# Patient Record
Sex: Female | Born: 1960 | Race: White | Hispanic: No | Marital: Married | Smoking: Former smoker
Health system: Southern US, Community
[De-identification: ages and names within clinical notes are randomized; demographics above are authoritative.]

## PROBLEM LIST (undated history)

## (undated) DIAGNOSIS — D649 Anemia, unspecified: Secondary | ICD-10-CM

## (undated) DIAGNOSIS — F419 Anxiety disorder, unspecified: Secondary | ICD-10-CM

## (undated) DIAGNOSIS — Z87442 Personal history of urinary calculi: Secondary | ICD-10-CM

## (undated) DIAGNOSIS — K219 Gastro-esophageal reflux disease without esophagitis: Secondary | ICD-10-CM

## (undated) DIAGNOSIS — I10 Essential (primary) hypertension: Secondary | ICD-10-CM

## (undated) DIAGNOSIS — D3A090 Benign carcinoid tumor of the bronchus and lung: Secondary | ICD-10-CM

## (undated) DIAGNOSIS — Z973 Presence of spectacles and contact lenses: Secondary | ICD-10-CM

## (undated) DIAGNOSIS — D490 Neoplasm of unspecified behavior of digestive system: Secondary | ICD-10-CM

## (undated) DIAGNOSIS — K2970 Gastritis, unspecified, without bleeding: Secondary | ICD-10-CM

## (undated) DIAGNOSIS — J189 Pneumonia, unspecified organism: Secondary | ICD-10-CM

## (undated) DIAGNOSIS — J45909 Unspecified asthma, uncomplicated: Secondary | ICD-10-CM

## (undated) DIAGNOSIS — R0600 Dyspnea, unspecified: Secondary | ICD-10-CM

## (undated) DIAGNOSIS — M1990 Unspecified osteoarthritis, unspecified site: Secondary | ICD-10-CM

## (undated) DIAGNOSIS — J439 Emphysema, unspecified: Secondary | ICD-10-CM

## (undated) DIAGNOSIS — C801 Malignant (primary) neoplasm, unspecified: Secondary | ICD-10-CM

## (undated) DIAGNOSIS — E785 Hyperlipidemia, unspecified: Secondary | ICD-10-CM

## (undated) DIAGNOSIS — J302 Other seasonal allergic rhinitis: Secondary | ICD-10-CM

## (undated) HISTORY — DX: Unspecified asthma, uncomplicated: J45.909

## (undated) HISTORY — PX: DOPPLER ECHOCARDIOGRAPHY: SHX263

## (undated) HISTORY — DX: Emphysema, unspecified: J43.9

## (undated) HISTORY — PX: COLONOSCOPY: SHX174

## (undated) HISTORY — PX: ABLATION: SHX5711

## (undated) HISTORY — DX: Pneumonia, unspecified organism: J18.9

## (undated) HISTORY — DX: Essential (primary) hypertension: I10

## (undated) HISTORY — PX: WISDOM TOOTH EXTRACTION: SHX21

## (undated) HISTORY — PX: TONSILLECTOMY: SUR1361

## (undated) HISTORY — DX: Anxiety disorder, unspecified: F41.9

## (undated) HISTORY — DX: Hyperlipidemia, unspecified: E78.5

---

## 1985-07-27 HISTORY — PX: TUBAL LIGATION: SHX77

## 2000-03-03 ENCOUNTER — Other Ambulatory Visit: Admission: RE | Admit: 2000-03-03 | Discharge: 2000-03-03 | Payer: Self-pay | Admitting: Obstetrics & Gynecology

## 2003-07-17 ENCOUNTER — Other Ambulatory Visit: Admission: RE | Admit: 2003-07-17 | Discharge: 2003-07-17 | Payer: Self-pay | Admitting: Internal Medicine

## 2003-10-23 ENCOUNTER — Ambulatory Visit (HOSPITAL_COMMUNITY): Admission: RE | Admit: 2003-10-23 | Discharge: 2003-10-23 | Payer: Self-pay | Admitting: Obstetrics and Gynecology

## 2004-07-24 ENCOUNTER — Other Ambulatory Visit: Admission: RE | Admit: 2004-07-24 | Discharge: 2004-07-24 | Payer: Self-pay | Admitting: Internal Medicine

## 2010-08-16 ENCOUNTER — Encounter: Payer: Self-pay | Admitting: Internal Medicine

## 2018-02-24 DIAGNOSIS — J189 Pneumonia, unspecified organism: Secondary | ICD-10-CM

## 2018-02-24 HISTORY — DX: Pneumonia, unspecified organism: J18.9

## 2018-02-24 HISTORY — PX: TEE WITHOUT CARDIOVERSION: SHX5443

## 2018-03-04 ENCOUNTER — Telehealth: Payer: Self-pay | Admitting: *Deleted

## 2018-03-04 NOTE — Telephone Encounter (Signed)
REFERRAL SENT TO [place], [place]

## 2018-03-12 NOTE — Progress Notes (Signed)
Cardiology Office Note   Date:  03/14/2018   ID:  Shannon [name], DOB September 04, 1960, MRN [id number]  PCP:  Rochel Brome, MD  Cardiologist:   Jenkins Rouge, MD   No chief complaint on file.     History of Present Illness: Shannon [name] is a 57 y.o. female who presents for consultation regarding chest pain. Referred by Adron Bene PA.  She is a former smoker with COPD/Emphysema. Had URI prolonged and CT chest ordered that showed coronary calcium. She has anxiety and gets tightness in chest when she is anxious Had been a good walker before lung issues now some persistent exertional dyspnea. Quit smoking 12 years ago No fever cough sputum. No PND/Orthopnea or edema.   Retired Systems analyst [employer] One sone in [place] and one daughter in [place] Enjoys going to [employer]     Past Medical History:  Diagnosis Date  . HLD (hyperlipidemia)     History reviewed. No pertinent surgical history.   Current Outpatient Medications  Medication Sig Dispense Refill  . atorvastatin (LIPITOR) 20 MG tablet Take 1 tablet by mouth daily.    . busPIRone (BUSPAR) 10 MG tablet Take 1 tablet by mouth 2 (two) times daily.    . Cyanocobalamin (VITAMIN B-12) 2500 MCG SUBL Place under the tongue as directed.    Marland Kitchen LORazepam (ATIVAN) 0.5 MG tablet Take 1 tablet by mouth daily.     No current facility-administered medications for this visit.     Allergies:   Levaquin [levofloxacin in d5w]    Social History:  The patient  reports that she has quit smoking. She quit after 33.00 years of use. She has never used smokeless tobacco. She reports that she has current or past drug history.   Family History:  The patient's family history includes COPD in her mother; Diabetes in her father; Heart disease in her mother.    ROS:  Please see the history of present illness.   Otherwise, review of systems are positive for none.   All other systems are reviewed and negative.    PHYSICAL EXAM: VS:   BP 132/82   Pulse 79   Ht 5' 5.5" (1.664 m)   Wt 191 lb (86.6 kg)   SpO2 98%   BMI 31.30 kg/m  , BMI Body mass index is 31.3 kg/m. Affect appropriate Healthy:  appears stated age 8: normal Neck supple with no adenopathy JVP normal no bruits no thyromegaly Lungs clear with no wheezing and good diaphragmatic motion Heart:  S1/S2 no murmur, no rub, gallop or click PMI normal Abdomen: benighn, BS positve, no tenderness, no AAA no bruit.  No HSM or HJR Distal pulses intact with no bruits No edema Neuro non-focal Skin warm and dry No muscular weakness    EKG:  03/14/18 SR rate 71 normal    Recent Labs: No results found for requested labs within last 8760 hours.    Lipid Panel No results found for: CHOL, TRIG, HDL, CHOLHDL, VLDL, LDLCALC, LDLDIRECT    Wt Readings from Last 3 Encounters:  03/14/18 191 lb (86.6 kg)      Other studies Reviewed: Additional studies/ records that were reviewed today include: Notes from primary .    ASSESSMENT AND PLAN:  1.  Chest Pain:  With coronary calcium on CT normal ECG f/u ETT 2. Dyspnea: related to lung disease check TTE for RV/LV function estimate PA pressure 3. Anxiety:  Continue Buspar improved  4. HLD:  Continue  statin labs with primary    Current medicines are reviewed at length with the patient today.  The patient does not have concerns regarding medicines.  The following changes have been made:  no change  Labs/ tests ordered today include: ETT and TTE  No orders of the defined types were placed in this encounter.    Disposition:   FU with cardiology PRN      Signed, Jenkins Rouge, MD  03/14/2018 8:50 AM    [facility] [address], [place], [place]  [address] Phone: [phone]; Fax: [phone]

## 2018-03-14 ENCOUNTER — Encounter: Payer: Self-pay | Admitting: Cardiovascular Disease

## 2018-03-14 ENCOUNTER — Ambulatory Visit: Payer: BC Managed Care – PPO | Admitting: Cardiovascular Disease

## 2018-03-14 VITALS — BP 132/82 | HR 79 | Ht 65.5 in | Wt 191.0 lb

## 2018-03-14 DIAGNOSIS — R079 Chest pain, unspecified: Secondary | ICD-10-CM | POA: Diagnosis not present

## 2018-03-14 NOTE — Patient Instructions (Addendum)
Medication Instructions:  Your physician recommends that you continue on your current medications as directed. Please refer to the Current Medication list given to you today.  Labwork: NONE  Testing/Procedures: Your physician has requested that you have an echocardiogram. Echocardiography is a painless test that uses sound waves to create images of your heart. It provides your doctor with information about the size and shape of your heart and how well your heart's chambers and valves are working. This procedure takes approximately one hour. There are no restrictions for this procedure.  Your physician has requested that you have an exercise tolerance test. For further information please visit [email or web address]. Please also follow instruction sheet, as given.  Follow-Up: Your physician wants you to follow-up as needed with Dr. Johnsie Cancel.    If you need a refill on your cardiac medications before your next appointment, please call your pharmacy.

## 2018-03-23 ENCOUNTER — Ambulatory Visit (HOSPITAL_COMMUNITY): Payer: BC Managed Care – PPO | Attending: Cardiology

## 2018-03-23 ENCOUNTER — Other Ambulatory Visit: Payer: Self-pay

## 2018-03-23 ENCOUNTER — Ambulatory Visit (INDEPENDENT_AMBULATORY_CARE_PROVIDER_SITE_OTHER): Payer: BC Managed Care – PPO

## 2018-03-23 DIAGNOSIS — R079 Chest pain, unspecified: Secondary | ICD-10-CM | POA: Diagnosis present

## 2018-03-23 DIAGNOSIS — Z87891 Personal history of nicotine dependence: Secondary | ICD-10-CM | POA: Diagnosis not present

## 2018-03-23 DIAGNOSIS — R0600 Dyspnea, unspecified: Secondary | ICD-10-CM | POA: Diagnosis not present

## 2018-03-23 DIAGNOSIS — E785 Hyperlipidemia, unspecified: Secondary | ICD-10-CM | POA: Diagnosis not present

## 2018-03-23 LAB — EXERCISE TOLERANCE TEST
CHL CUP MPHR: 163 {beats}/min
CHL CUP RESTING HR STRESS: 78 {beats}/min
CHL RATE OF PERCEIVED EXERTION: 17
CSEPEDS: 0 s
Estimated workload: 10.1 METS
Exercise duration (min): 9 min
Peak HR: 153 {beats}/min
Percent HR: 93 %

## 2018-03-29 LAB — PULMONARY FUNCTION TEST

## 2018-04-07 ENCOUNTER — Encounter: Payer: Self-pay | Admitting: Pulmonary Disease

## 2018-04-08 ENCOUNTER — Encounter: Payer: Self-pay | Admitting: Pulmonary Disease

## 2018-04-08 ENCOUNTER — Ambulatory Visit (INDEPENDENT_AMBULATORY_CARE_PROVIDER_SITE_OTHER): Payer: BC Managed Care – PPO | Admitting: Pulmonary Disease

## 2018-04-08 ENCOUNTER — Other Ambulatory Visit: Payer: BC Managed Care – PPO

## 2018-04-08 VITALS — BP 126/78 | HR 78 | Ht 64.0 in | Wt 192.2 lb

## 2018-04-08 DIAGNOSIS — J309 Allergic rhinitis, unspecified: Secondary | ICD-10-CM

## 2018-04-08 DIAGNOSIS — R0602 Shortness of breath: Secondary | ICD-10-CM | POA: Diagnosis not present

## 2018-04-08 DIAGNOSIS — J302 Other seasonal allergic rhinitis: Secondary | ICD-10-CM

## 2018-04-08 DIAGNOSIS — Z8709 Personal history of other diseases of the respiratory system: Secondary | ICD-10-CM | POA: Diagnosis not present

## 2018-04-08 DIAGNOSIS — R0609 Other forms of dyspnea: Secondary | ICD-10-CM

## 2018-04-08 NOTE — Progress Notes (Signed)
Synopsis: Referred in September 2019 for shortness of breath by Rochel Brome, MD  Subjective:   PATIENT ID: Shannon [name] GENDER: female DOB: [birth year], MRN: [id number]  Chief Complaint  Patient presents with  . Consult    Diagnised iwtih Bronchitis in July, developed into PNE, SOB with exertion, stopped symbicort and breathing improved. Denies cough and chest discomfort.     She was diagnosed with bronchitis in July followed by pneumonia. She was treated with antibiotics and prednisone. Unsure how long but she felt better after. She was started on symbicort as well. Once she felt better she stopped taking most of her medications. She started taking her symbicort again she felt worse for a little bit. After stopping the inhaler she felt better. Currently she is exercising and walking more. Not using an inhaler.   Former smoker, quit 12 years ago, history of asthma, lots of second hand smoker, mother was a smoker. She was in the ED as peds for asthma treatments. She did have allergy testing as a child but does not remember any details. Normally had 1-2 episodes of bronchitis with when she was smoking. This has stopped since she stopped smoking.     Past Medical History:  Diagnosis Date  . HLD (hyperlipidemia)      Family History  Problem Relation Age of Onset  . Heart disease Mother   . COPD Mother   . Emphysema Mother   . Diabetes Father      Past Surgical History:  Procedure Laterality Date  . ABLATION     endometrial  . TUBAL LIGATION  1987    Social History   Socioeconomic History  . Marital status: Single    Spouse name: Not on file  . Number of children: Not on file  . Years of education: Not on file  . Highest education level: Not on file  Occupational History  . Not on file  Social Needs  . Financial resource strain: Not on file  . Food insecurity:    Worry: Not on file    Inability: Not on file  . Transportation needs:    Medical: Not on file   Non-medical: Not on file  Tobacco Use  . Smoking status: Former Smoker    Years: 33.00  . Smokeless tobacco: Never Used  . Tobacco comment: quit 12 years ago  Substance and Sexual Activity  . Alcohol use: Not on file  . Drug use: Not Currently  . Sexual activity: Not on file  Lifestyle  . Physical activity:    Days per week: Not on file    Minutes per session: Not on file  . Stress: Not on file  Relationships  . Social connections:    Talks on phone: Not on file    Gets together: Not on file    Attends religious service: Not on file    Active member of club or organization: Not on file    Attends meetings of clubs or organizations: Not on file    Relationship status: Not on file  . Intimate partner violence:    Fear of current or ex partner: Not on file    Emotionally abused: Not on file    Physically abused: Not on file    Forced sexual activity: Not on file  Other Topics Concern  . Not on file  Social History Narrative  . Not on file     Allergies  Allergen Reactions  . Levaquin [Levofloxacin In D5w]  Nausea vomiting      Outpatient Medications Prior to Visit  Medication Sig Dispense Refill  . atorvastatin (LIPITOR) 20 MG tablet Take 1 tablet by mouth daily.    . busPIRone (BUSPAR) 10 MG tablet Take 1 tablet by mouth 2 (two) times daily.    . Cyanocobalamin (VITAMIN B-12) 2500 MCG SUBL Place under the tongue as directed.    Marland Kitchen LORazepam (ATIVAN) 0.5 MG tablet Take 1 tablet by mouth daily.     No facility-administered medications prior to visit.     Review of Systems  Constitutional: Negative for chills, fever, malaise/fatigue and weight loss.  HENT: Negative for hearing loss, sore throat and tinnitus.   Eyes: Negative for blurred vision and double vision.  Respiratory: Positive for shortness of breath. Negative for cough, hemoptysis, sputum production, wheezing and stridor.   Cardiovascular: Negative for chest pain, palpitations, orthopnea, leg swelling and  PND.  Gastrointestinal: Negative for abdominal pain, constipation, diarrhea, heartburn, nausea and vomiting.  Genitourinary: Negative for dysuria, hematuria and urgency.  Musculoskeletal: Negative for joint pain and myalgias.  Skin: Negative for itching and rash.  Neurological: Negative for dizziness, tingling, weakness and headaches.  Endo/Heme/Allergies: Negative for environmental allergies. Does not bruise/bleed easily.  Psychiatric/Behavioral: Negative for depression. The patient is not nervous/anxious and does not have insomnia.   All other systems reviewed and are negative.    Objective:  Physical Exam  Constitutional: She is oriented to person, place, and time. She appears well-developed and well-nourished. No distress.  HENT:  Head: Normocephalic and atraumatic.  Mouth/Throat: Oropharynx is clear and moist.  Eyes: Pupils are equal, round, and reactive to light. Conjunctivae are normal. No scleral icterus.  Neck: Neck supple. No JVD present. No tracheal deviation present.  Cardiovascular: Normal rate, regular rhythm, normal heart sounds and intact distal pulses.  No murmur heard. Pulmonary/Chest: Effort normal and breath sounds normal. No accessory muscle usage or stridor. No tachypnea. No respiratory distress. She has no wheezes. She has no rhonchi. She has no rales.  Abdominal: Soft. Bowel sounds are normal. She exhibits no distension. There is no tenderness.  Musculoskeletal: She exhibits no edema or tenderness.  Bilateral cool lower extremities, slightly purplish in color.  Lymphadenopathy:    She has no cervical adenopathy.  Neurological: She is alert and oriented to person, place, and time.  Skin: Skin is warm and dry. Capillary refill takes less than 2 seconds. No rash noted.  Psychiatric: She has a normal mood and affect. Her behavior is normal.  Vitals reviewed.    Vitals:   04/08/18 1417  BP: 126/78  Pulse: 78  SpO2: 94%  Weight: 192 lb 3.2 oz (87.2 kg)    Height: 5\' 4"  (1.626 m)   94% on RA BMI Readings from Last 3 Encounters:  04/08/18 32.99 kg/m  03/14/18 31.30 kg/m   Wt Readings from Last 3 Encounters:  04/08/18 192 lb 3.2 oz (87.2 kg)  03/14/18 191 lb (86.6 kg)     CBC No results found for: WBC, RBC, HGB, HCT, PLT, MCV, MCH, MCHC, RDW, LYMPHSABS, MONOABS, EOSABS, BASOSABS  Chest Imaging: CT chest 03/02/2018: No evidence of PE, mild centrilobular emphysema, small hiatal hernia.  Pulmonary Functions Testing Results: Office spirometry 03/29/2018: FVC 3.03 L, 89% predicted FEV1 2.18 L, 82% predicted Ratio 71.9  FeNO: None    Pathology: None   Echocardiogram:  03/23/2018: Left ventricle: The cavity size was normal. Wall thickness was   normal. Systolic function was normal. The estimated ejection  fraction was in the range of 55% to 60%. Wall motion was normal;   there were no regional wall motion abnormalities. Doppler   parameters are consistent with abnormal left ventricular   relaxation (grade 1 diastolic dysfunction).  Treadmill exercise stress test: 03/23/2018: Normal heart rate response with Bruce protocol.  Heart Catheterization: None    Assessment & Plan:   Seasonal allergies - Plan: Pulmonary function test, CBC w/Diff, Resp Allergy Profile Regn2DC [facility]  Allergic rhinitis, unspecified seasonality, unspecified trigger - Plan: Pulmonary function test  History of asthma  SOB (shortness of breath)  DOE (dyspnea on exertion)  Discussion:  This is a pleasant 57 year old female with past medical history of asthma as a child.  She had a recent bout of acute bronchitis and pneumonia treated with antibiotics and steroids.  It has taken approximately 6 to 8 weeks but she has finally started to feel better.  She has been walking and exercise more and would recommend her to continue to do this.  FULL PFTS to be completed to look for any underlying obstruction occluding evaluation of her lung volumes  DLCO. Blood work today: CBC w/ diff, Regional allergy panel  Flonase OTC 1 spray each nostril twice daily  As needed albuterol inhaler If needed will make recommendations regarding any change in medication after PFTs or lab work has been completed and resulted. RTC in 6 months    Current Outpatient Medications:  .  atorvastatin (LIPITOR) 20 MG tablet, Take 1 tablet by mouth daily., Disp: , Rfl:  .  busPIRone (BUSPAR) 10 MG tablet, Take 1 tablet by mouth 2 (two) times daily., Disp: , Rfl:  .  Cyanocobalamin (VITAMIN B-12) 2500 MCG SUBL, Place under the tongue as directed., Disp: , Rfl:  .  LORazepam (ATIVAN) 0.5 MG tablet, Take 1 tablet by mouth daily., Disp: , Rfl:    Garner Nash, DO [facility] 04/08/2018 3:15 PM

## 2018-04-08 NOTE — Patient Instructions (Signed)
FULL PFTS to be completed Blood work today: CBC w/ diff, Regional allergy panel  Flonase OTC 1 spray each nostril twice daily  As needed albuterol inhaler RTC in 6 months

## 2018-04-11 ENCOUNTER — Other Ambulatory Visit (INDEPENDENT_AMBULATORY_CARE_PROVIDER_SITE_OTHER): Payer: BC Managed Care – PPO

## 2018-04-11 DIAGNOSIS — J302 Other seasonal allergic rhinitis: Secondary | ICD-10-CM

## 2018-04-11 LAB — RESPIRATORY ALLERGY PROFILE REGION II ~~LOC~~
Allergen, A. alternata, m6: <0.1 kU/L
Allergen, Comm Silver Birch, t9: <0.1 kU/L
Allergen, Cottonwood, t14: <0.1 kU/L
Allergen, D pternoyssinus,d7: <0.1 kU/L
Allergen, Mouse Urine Protein, e78: <0.1 kU/L
Allergen, Oak,t7: <0.1 kU/L
Allergen, P. notatum, m1: <0.1 kU/L
Aspergillus fumigatus, m3: <0.1 kU/L
Bermuda Grass: <0.1 kU/L
Box Elder IgE: <0.1 kU/L
CLASS: 0
CLASS: 0
CLASS: 0
CLASS: 0
CLASS: 0
CLASS: 0
CLASS: 0
CLASS: 0
CLASS: 0
CLASS: 0
CLASS: 0
CLASS: 0
Class: 0
Class: 0
Class: 0
Class: 0
Class: 0
Class: 0
Class: 0
Class: 0
Class: 0
Class: 0
Class: 0
Class: 0
Cockroach: <0.1 kU/L
D. farinae: <0.1 kU/L
Dog Dander: 0.12 kU/L — ABNORMAL HIGH
Elm IgE: <0.1 kU/L
IgE (Immunoglobulin E), Serum: 27 kU/L (ref <=114)
Johnson Grass: <0.1 kU/L
Sheep Sorrel IgE: <0.1 kU/L
Timothy Grass: <0.1 kU/L

## 2018-04-11 LAB — CBC WITH DIFFERENTIAL/PLATELET
BASOS ABS: 0 10*3/uL (ref 0.0–0.1)
Basophils Relative: 0.5 % (ref 0.0–3.0)
EOS ABS: 0.2 10*3/uL (ref 0.0–0.7)
Eosinophils Relative: 3 % (ref 0.0–5.0)
HCT: 42.3 % (ref 36.0–46.0)
Hemoglobin: 14.3 g/dL (ref 12.0–15.0)
LYMPHS ABS: 1.9 10*3/uL (ref 0.7–4.0)
Lymphocytes Relative: 25.1 % (ref 12.0–46.0)
MCHC: 33.8 g/dL (ref 30.0–36.0)
MCV: 85.6 fl (ref 78.0–100.0)
MONO ABS: 0.6 10*3/uL (ref 0.1–1.0)
MONOS PCT: 7.4 % (ref 3.0–12.0)
NEUTROS ABS: 4.9 10*3/uL (ref 1.4–7.7)
NEUTROS PCT: 64 % (ref 43.0–77.0)
PLATELETS: 205 10*3/uL (ref 150.0–400.0)
RBC: 4.94 Mil/uL (ref 3.87–5.11)
RDW: 14.2 % (ref 11.5–15.5)
WBC: 7.6 10*3/uL (ref 4.0–10.5)

## 2018-04-11 LAB — INTERPRETATION:

## 2018-04-13 ENCOUNTER — Telehealth: Payer: Self-pay | Admitting: Pulmonary Disease

## 2018-04-13 NOTE — Telephone Encounter (Signed)
Spoke with patient, informed of results per BI. Voiced understanding. Nothing further needed at this time.

## 2018-04-25 ENCOUNTER — Encounter: Payer: Self-pay | Admitting: Internal Medicine

## 2018-05-31 ENCOUNTER — Ambulatory Visit: Payer: BC Managed Care – PPO | Admitting: Pulmonary Disease

## 2018-05-31 ENCOUNTER — Encounter: Payer: Self-pay | Admitting: Internal Medicine

## 2018-05-31 ENCOUNTER — Ambulatory Visit (AMBULATORY_SURGERY_CENTER): Payer: Self-pay | Admitting: *Deleted

## 2018-05-31 VITALS — Ht 65.0 in | Wt 194.8 lb

## 2018-05-31 DIAGNOSIS — Z1211 Encounter for screening for malignant neoplasm of colon: Secondary | ICD-10-CM

## 2018-05-31 MED ORDER — NA SULFATE-K SULFATE-MG SULF 17.5-3.13-1.6 GM/177ML PO SOLN: 1.0000 | Freq: Once | ORAL | 0 refills | Status: AC

## 2018-05-31 NOTE — Progress Notes (Signed)
No egg or soy allergy known to patient  No issues with past sedation with any surgeries  or procedures, no intubation problems  No diet pills per patient No home 02 use per patient  No blood thinners per patient  Pt denies issues with constipation  No A fib or A flutter  EMMI video sent to pt's e mail  Suprep $15 coupon to pt  Pt has a breathing test scheduled for 11-6- will call us if they start 02 or any significant changes

## 2018-06-01 ENCOUNTER — Ambulatory Visit (INDEPENDENT_AMBULATORY_CARE_PROVIDER_SITE_OTHER): Payer: BC Managed Care – PPO | Admitting: Pulmonary Disease

## 2018-06-01 ENCOUNTER — Encounter: Payer: Self-pay | Admitting: Pulmonary Disease

## 2018-06-01 VITALS — BP 124/78 | HR 87 | Ht 64.5 in | Wt 193.0 lb

## 2018-06-01 DIAGNOSIS — R0602 Shortness of breath: Secondary | ICD-10-CM

## 2018-06-01 DIAGNOSIS — J302 Other seasonal allergic rhinitis: Secondary | ICD-10-CM

## 2018-06-01 DIAGNOSIS — Z8709 Personal history of other diseases of the respiratory system: Secondary | ICD-10-CM | POA: Diagnosis not present

## 2018-06-01 DIAGNOSIS — J309 Allergic rhinitis, unspecified: Secondary | ICD-10-CM | POA: Diagnosis not present

## 2018-06-01 LAB — PULMONARY FUNCTION TEST
DL/VA % pred: 91 %
DL/VA: 4.45 ml/min/mmHg/L
DLCO COR % PRED: 87 %
DLCO COR: 21.79 ml/min/mmHg
DLCO UNC % PRED: 89 %
DLCO UNC: 22.37 ml/min/mmHg
FEF 25-75 PRE: 1.53 L/s
FEF 25-75 Post: 1.75 L/sec
FEF2575-%Change-Post: 14 %
FEF2575-%PRED-PRE: 60 %
FEF2575-%Pred-Post: 69 %
FEV1-%Change-Post: 5 %
FEV1-%PRED-PRE: 77 %
FEV1-%Pred-Post: 80 %
FEV1-Post: 2.17 L
FEV1-Pre: 2.07 L
FEV1FVC-%Change-Post: -2 %
FEV1FVC-%Pred-Pre: 92 %
FEV6-%Change-Post: 8 %
FEV6-%PRED-POST: 91 %
FEV6-%PRED-PRE: 84 %
FEV6-POST: 3.05 L
FEV6-Pre: 2.81 L
FEV6FVC-%CHANGE-POST: 0 %
FEV6FVC-%PRED-POST: 103 %
FEV6FVC-%Pred-Pre: 103 %
FVC-%Change-Post: 8 %
FVC-%PRED-POST: 88 %
FVC-%Pred-Pre: 82 %
FVC-POST: 3.05 L
FVC-Pre: 2.83 L
POST FEV6/FVC RATIO: 100 %
PRE FEV1/FVC RATIO: 73 %
Post FEV1/FVC ratio: 71 %
Pre FEV6/FVC Ratio: 100 %
RV % pred: 150 %
RV: 2.95 L
TLC % PRED: 114 %
TLC: 5.87 L

## 2018-06-01 MED ORDER — FLUTICASONE FUROATE-VILANTEROL 200-25 MCG/INH IN AEPB: 1.0000 | INHALATION_SPRAY | Freq: Every day | RESPIRATORY_TRACT | 5 refills | Status: DC

## 2018-06-01 NOTE — Progress Notes (Signed)
Synopsis: Referred in September 2019 for shortness of breath by Rochel Brome, MD  Subjective:   PATIENT ID: Shannon [name] GENDER: female DOB: 1960-10-31, MRN: [id number]  Chief Complaint  Patient presents with  . Follow-up    PFT today     She was diagnosed with bronchitis in July followed by pneumonia. She was treated with antibiotics and prednisone. Unsure how long but she felt better after. She was started on symbicort as well. Once she felt better she stopped taking most of her medications. She started taking her symbicort again she felt worse for a little bit. After stopping the inhaler she felt better. Currently she is exercising and walking more. Not using an inhaler.   Former smoker, quit 12 years ago, history of asthma, lots of second hand smoker, mother was a smoker. She was in the ED as peds for asthma treatments. She did have allergy testing as a child but does not remember any details. Normally had 1-2 episodes of bronchitis with when she was smoking. This has stopped since she stopped smoking.     Past Medical History:  Diagnosis Date  . Anxiety   . Asthma   . HLD (hyperlipidemia)   . Pneumonia      Family History  Problem Relation Age of Onset  . Heart disease Mother   . COPD Mother   . Emphysema Mother   . Diabetes Father   . Colon cancer Neg Hx   . Colon polyps Neg Hx   . Esophageal cancer Neg Hx   . Rectal cancer Neg Hx   . Stomach cancer Neg Hx      Past Surgical History:  Procedure Laterality Date  . ABLATION     endometrial  . DOPPLER ECHOCARDIOGRAPHY    . TEE WITHOUT CARDIOVERSION  02/2018  . TUBAL LIGATION  1987    Social History   Socioeconomic History  . Marital status: Married    Spouse name: Not on file  . Number of children: Not on file  . Years of education: Not on file  . Highest education level: Not on file  Occupational History  . Not on file  Social Needs  . Financial resource strain: Not on file  . Food insecurity:   Worry: Not on file    Inability: Not on file  . Transportation needs:    Medical: Not on file    Non-medical: Not on file  Tobacco Use  . Smoking status: Former Smoker    Years: 33.00  . Smokeless tobacco: Never Used  . Tobacco comment: quit 12 years ago  Substance and Sexual Activity  . Alcohol use: Yes    Comment: occ- rare   . Drug use: Not Currently  . Sexual activity: Not on file  Lifestyle  . Physical activity:    Days per week: Not on file    Minutes per session: Not on file  . Stress: Not on file  Relationships  . Social connections:    Talks on phone: Not on file    Gets together: Not on file    Attends religious service: Not on file    Active member of club or organization: Not on file    Attends meetings of clubs or organizations: Not on file    Relationship status: Not on file  . Intimate partner violence:    Fear of current or ex partner: Not on file    Emotionally abused: Not on file    Physically abused: Not  on file    Forced sexual activity: Not on file  Other Topics Concern  . Not on file  Social History Narrative  . Not on file     Allergies  Allergen Reactions  . Levaquin [Levofloxacin In D5w]     Nausea vomiting   . Other     Dog Dander      Outpatient Medications Prior to Visit  Medication Sig Dispense Refill  . atorvastatin (LIPITOR) 20 MG tablet Take 1 tablet by mouth daily.    . busPIRone (BUSPAR) 10 MG tablet Take 1 tablet by mouth 2 (two) times daily.    . Cyanocobalamin (VITAMIN B-12) 2500 MCG SUBL Place under the tongue as directed.    Marland Kitchen LORazepam (ATIVAN) 0.5 MG tablet Take 1 tablet by mouth daily.     No facility-administered medications prior to visit.     Review of Systems  Constitutional: Negative for chills, fever, malaise/fatigue and weight loss.  HENT: Negative for hearing loss, sore throat and tinnitus.   Eyes: Negative for blurred vision and double vision.  Respiratory: Positive for cough, sputum production, shortness  of breath and wheezing. Negative for hemoptysis and stridor.   Cardiovascular: Negative for chest pain, palpitations, orthopnea, leg swelling and PND.  Gastrointestinal: Negative for abdominal pain, constipation, diarrhea, heartburn, nausea and vomiting.  Genitourinary: Negative for dysuria, hematuria and urgency.  Musculoskeletal: Negative for joint pain and myalgias.  Skin: Negative for itching and rash.  Neurological: Negative for dizziness, tingling, weakness and headaches.  Endo/Heme/Allergies: Negative for environmental allergies. Does not bruise/bleed easily.  Psychiatric/Behavioral: Negative for depression. The patient is not nervous/anxious and does not have insomnia.   All other systems reviewed and are negative.    Objective:  Physical Exam  Constitutional: She is oriented to person, place, and time. She appears well-developed and well-nourished. No distress.  HENT:  Head: Normocephalic and atraumatic.  Mouth/Throat: Oropharynx is clear and moist.  Eyes: Pupils are equal, round, and reactive to light. Conjunctivae are normal. No scleral icterus.  Neck: Neck supple. No JVD present. No tracheal deviation present.  Cardiovascular: Normal rate, regular rhythm, normal heart sounds and intact distal pulses.  No murmur heard. Pulmonary/Chest: Effort normal and breath sounds normal. No accessory muscle usage or stridor. No tachypnea. No respiratory distress. She has no wheezes. She has no rhonchi. She has no rales.  Abdominal: Soft. Bowel sounds are normal. She exhibits no distension. There is no tenderness.  Musculoskeletal: She exhibits no edema or tenderness.  Lymphadenopathy:    She has no cervical adenopathy.  Neurological: She is alert and oriented to person, place, and time.  Skin: Skin is warm and dry. Capillary refill takes less than 2 seconds. No rash noted.  Psychiatric: She has a normal mood and affect. Her behavior is normal.  Vitals reviewed.    Vitals:   06/01/18  1556  BP: 124/78  Pulse: 87  SpO2: 97%  Weight: 193 lb (87.5 kg)  Height: 5' 4.5" (1.638 m)   97% on RA BMI Readings from Last 3 Encounters:  06/01/18 32.62 kg/m  05/31/18 32.42 kg/m  04/08/18 32.99 kg/m   Wt Readings from Last 3 Encounters:  06/01/18 193 lb (87.5 kg)  05/31/18 194 lb 12.8 oz (88.4 kg)  04/08/18 192 lb 3.2 oz (87.2 kg)     CBC    Component Value Date/Time   WBC 7.6 04/11/2018 1417   RBC 4.94 04/11/2018 1417   HGB 14.3 04/11/2018 1417   HCT 42.3 04/11/2018 1417  PLT 205.0 04/11/2018 1417   MCV 85.6 04/11/2018 1417   MCHC 33.8 04/11/2018 1417   RDW 14.2 04/11/2018 1417   LYMPHSABS 1.9 04/11/2018 1417   MONOABS 0.6 04/11/2018 1417   EOSABS 0.2 04/11/2018 1417   BASOSABS 0.0 04/11/2018 1417    Chest Imaging: CT chest 03/02/2018: No evidence of PE, mild centrilobular emphysema, small hiatal hernia.  Pulmonary Functions Testing Results: Office spirometry 03/29/2018: FVC 3.03 L, 89% predicted FEV1 2.18 L, 82% predicted Ratio 71.9  PFT Results Latest Ref Rng & Units 06/01/2018  FVC-Pre L 2.83  FVC-Predicted Pre % 82  FVC-Post L 3.05  FVC-Predicted Post % 88  Pre FEV1/FVC % % 73  Post FEV1/FCV % % 71  FEV1-Pre L 2.07  FEV1-Predicted Pre % 77  DLCO UNC% % 89  DLCO COR %Predicted % 91  TLC L 5.87  TLC % Predicted % 114  RV % Predicted % 150    FeNO: None    Pathology: None   Echocardiogram:  03/23/2018: Left ventricle: The cavity size was normal. Wall thickness was   normal. Systolic function was normal. The estimated ejection   fraction was in the range of 55% to 60%. Wall motion was normal;   there were no regional wall motion abnormalities. Doppler   parameters are consistent with abnormal left ventricular   relaxation (grade 1 diastolic dysfunction).  Treadmill exercise stress test: 03/23/2018: Normal heart rate response with Bruce protocol.  Heart Catheterization: None    Assessment & Plan:   Seasonal allergies  Allergic  rhinitis, unspecified seasonality, unspecified trigger  History of asthma  SOB (shortness of breath)  Discussion:  This is a 57 year old female with past medical history of asthma as a child.  She has had recurrent URI symptoms throughout the year.  Since we last saw each other she has improved from her prolonged baseline.  He had pulmonary function test completed today.  No evidence of obstruction.  Lung volumes suggest mild air trapping.  Prior IgE of 27, RAST positive for dog dander  Plan: Continue Flonase daily Continue albuterol as needed for shortness of breath and wheezing Sample of Breo 200 she will use this for the next few weeks and see if she can tell a difference in her dyspnea symptoms. Return to clinic in 6 months or as needed.  Greater than 50% of this patient's 25-minute office visit was spent face-to-face discussing the above recommendations and treatment plan.   Current Outpatient Medications:  .  atorvastatin (LIPITOR) 20 MG tablet, Take 1 tablet by mouth daily., Disp: , Rfl:  .  busPIRone (BUSPAR) 10 MG tablet, Take 1 tablet by mouth 2 (two) times daily., Disp: , Rfl:  .  Cyanocobalamin (VITAMIN B-12) 2500 MCG SUBL, Place under the tongue as directed., Disp: , Rfl:  .  LORazepam (ATIVAN) 0.5 MG tablet, Take 1 tablet by mouth daily., Disp: , Rfl:    Garner Nash, DO [facility] 06/01/2018 4:23 PM

## 2018-06-01 NOTE — Patient Instructions (Addendum)
Thank you for visiting Dr. Valeta Harms at [facility]. Today we recommend the following: No orders of the defined types were placed in this encounter.  Meds ordered this encounter  Medications  . fluticasone furoate-vilanterol (BREO ELLIPTA) 200-25 MCG/INH AEPB    Sig: Inhale 1 puff into the lungs daily.    Dispense:  28 each    Refill:  5    Order Specific Question:   Lot Number?    Answer:   [id number]    Order Specific Question:   Expiration Date?    Answer:   08/02/2019    Order Specific Question:   Manufacturer?    Answer:   [employer] [[id number]]    Order Specific Question:   Quantity    Answer:   2   Return in about 6 months (around 11/30/2018), or if symptoms worsen or fail to improve.  We are moving our office in November. The new address will be: [address] [employer] [facility] Phone: [phone]

## 2018-06-01 NOTE — Progress Notes (Signed)
PFT done today. 

## 2018-06-10 ENCOUNTER — Encounter: Payer: Self-pay | Admitting: Internal Medicine

## 2018-06-10 ENCOUNTER — Ambulatory Visit (AMBULATORY_SURGERY_CENTER): Payer: BC Managed Care – PPO | Admitting: Internal Medicine

## 2018-06-10 VITALS — BP 146/76 | HR 66 | Temp 97.8°F | Resp 13 | Ht 65.0 in | Wt 194.0 lb

## 2018-06-10 DIAGNOSIS — D122 Benign neoplasm of ascending colon: Secondary | ICD-10-CM

## 2018-06-10 DIAGNOSIS — K6289 Other specified diseases of anus and rectum: Secondary | ICD-10-CM | POA: Diagnosis not present

## 2018-06-10 DIAGNOSIS — Z1211 Encounter for screening for malignant neoplasm of colon: Secondary | ICD-10-CM | POA: Diagnosis not present

## 2018-06-10 DIAGNOSIS — D128 Benign neoplasm of rectum: Secondary | ICD-10-CM

## 2018-06-10 DIAGNOSIS — D3A026 Benign carcinoid tumor of the rectum: Secondary | ICD-10-CM | POA: Diagnosis not present

## 2018-06-10 MED ORDER — SODIUM CHLORIDE 0.9 % IV SOLN: 500.0000 mL | Freq: Once | INTRAVENOUS | Status: DC

## 2018-06-10 NOTE — Progress Notes (Signed)
Pt's states no medical or surgical changes since previsit or office visit. 

## 2018-06-10 NOTE — Progress Notes (Signed)
To PACU, VSS. Report to Rn.tb 

## 2018-06-10 NOTE — Op Note (Signed)
[facility] Patient Name: Shannon [name] Procedure Date: 06/10/2018 9:08 AM MRN: [id number] Endoscopist: Docia Chuck. [name] , MD Age: 57 Referring MD:  Date of Birth: 23-Jul-1961 Gender: Female Account #: [id number] Procedure:                Colonoscopy with cold snare polypectomy x 2; with                            biopsies Indications:              Screening for colorectal malignant neoplasm Medicines:                Monitored Anesthesia Care Procedure:                Pre-Anesthesia Assessment:                           - Prior to the procedure, a History and Physical                            was performed, and patient medications and                            allergies were reviewed. The patient's tolerance of                            previous anesthesia was also reviewed. The risks                            and benefits of the procedure and the sedation                            options and risks were discussed with the patient.                            All questions were answered, and informed consent                            was obtained. Prior Anticoagulants: The patient has                            taken no previous anticoagulant or antiplatelet                            agents. ASA Grade Assessment: II - A patient with                            mild systemic disease. After reviewing the risks                            and benefits, the patient was deemed in                            satisfactory condition to undergo the procedure.  After obtaining informed consent, the colonoscope                            was passed under direct vision. Throughout the                            procedure, the patient's blood pressure, pulse, and                            oxygen saturations were monitored continuously. The                            Colonoscope was introduced through the anus and                            advanced to the the  cecum, identified by                            appendiceal orifice and ileocecal valve. The                            ileocecal valve, appendiceal orifice, and rectum                            were photographed. The quality of the bowel                            preparation was good. The colonoscopy was performed                            without difficulty. The patient tolerated the                            procedure well. The bowel preparation used was                            SUPREP. Scope In: 9:18:49 AM Scope Out: 9:39:35 AM Scope Withdrawal Time: 0 hours 17 minutes 16 seconds  Total Procedure Duration: 0 hours 20 minutes 46 seconds  Findings:                 Two polyps were found in the ascending colon. The                            polyps were 2 to 5 mm in size. These polyps were                            removed with a cold snare. Resection and retrieval                            were complete.                           Multiple small and large-mouthed diverticula were  found in the entire colon.                           Bleeding external and internal hemorrhoids were                            found during retroflexion. The hemorrhoids were                            large.                           A submucosal mass was found in the posterior rectum                            approximately 3 to 4 cm from the anal verge. The                            mass was circular and well-circumscribed with a                            small central umbilication. In addition, its                            diameter measured 12-15 mm. This was tunnel                            biopsied with a cold forceps for histology. Complications:            No immediate complications. Estimated blood loss:                            None. Estimated Blood Loss:     Estimated blood loss: none. Impression:               - Two 2 to 5 mm polyps in the ascending colon,                             removed with a cold snare. Resected and retrieved.                           - Diverticulosis in the entire examined colon.                           - Bleeding external and internal hemorrhoids.                           - Likely benign small submucosal tumor in the                            rectum. Biopsied.                           - The exam was otherwise normal on direct and  retroflexed views Recommendation:           - Repeat colonoscopy in 5 years for surveillance.                           - Patient has a contact number available for                            emergencies. The signs and symptoms of potential                            delayed complications were discussed with the                            patient. Return to normal activities tomorrow.                            Written discharge instructions were provided to the                            patient.                           - Resume previous diet.                           - Continue present medications.                           - Await pathology results.                           - Arrange rectal endoscopic ultrasound and possible                            resection of the submucosal rectal lesion with Dr.                            Farrel Gobble. [name], MD 06/10/2018 9:50:49 AM This report has been signed electronically.

## 2018-06-10 NOTE — Progress Notes (Signed)
Pt's states no medical or surgical changes since previsit or office visit. With exception of new dx of emphysemia.

## 2018-06-10 NOTE — Patient Instructions (Signed)
Handouts: Polyps  YOU HAD AN ENDOSCOPIC PROCEDURE TODAY AT THE [facility]:   Refer to the procedure report that was given to you for any specific questions about what was found during the examination.  If the procedure report does not answer your questions, please call your gastroenterologist to clarify.  If you requested that your care partner not be given the details of your procedure findings, then the procedure report has been included in a sealed envelope for you to review at your convenience later.  YOU SHOULD EXPECT: Some feelings of bloating in the abdomen. Passage of more gas than usual.  Walking can help get rid of the air that was put into your GI tract during the procedure and reduce the bloating. If you had a lower endoscopy (such as a colonoscopy or flexible sigmoidoscopy) you may notice spotting of blood in your stool or on the toilet paper. If you underwent a bowel prep for your procedure, you may not have a normal bowel movement for a few days.  Please Note:  You might notice some irritation and congestion in your nose or some drainage.  This is from the oxygen used during your procedure.  There is no need for concern and it should clear up in a day or so.  SYMPTOMS TO REPORT IMMEDIATELY:   Following lower endoscopy (colonoscopy or flexible sigmoidoscopy):  Excessive amounts of blood in the stool  Significant tenderness or worsening of abdominal pains  Swelling of the abdomen that is new, acute  Fever of 100F or higher  For urgent or emergent issues, a gastroenterologist can be reached at any hour by calling [phone].   DIET:  We do recommend a small meal at first, but then you may proceed to your regular diet.  Drink plenty of fluids but you should avoid alcoholic beverages for 24 hours.  ACTIVITY:  You should plan to take it easy for the rest of today and you should NOT DRIVE or use heavy machinery until tomorrow (because of the sedation medicines used  during the test).    FOLLOW UP: Our staff will call the number listed on your records the next business day following your procedure to check on you and address any questions or concerns that you may have regarding the information given to you following your procedure. If we do not reach you, we will leave a message.  However, if you are feeling well and you are not experiencing any problems, there is no need to return our call.  We will assume that you have returned to your regular daily activities without incident.  If any biopsies were taken you will be contacted by phone or by letter within the next 1-3 weeks.  Please call us at [phone] if you have not heard about the biopsies in 3 weeks.    SIGNATURES/CONFIDENTIALITY: You and/or your care partner have signed paperwork which will be entered into your electronic medical record.  These signatures attest to the fact that that the information above on your After Visit Summary has been reviewed and is understood.  Full responsibility of the confidentiality of this discharge information lies with you and/or your care-partner. 

## 2018-06-13 ENCOUNTER — Telehealth: Payer: Self-pay

## 2018-06-13 NOTE — Telephone Encounter (Signed)
  Follow up Call-  Call back number 06/10/2018  Post procedure Call Back phone  # [phone]  Permission to leave phone message Yes  Some recent data might be hidden     Patient questions:  Do you have a fever, pain , or abdominal swelling? No. Pain Score  0 *  Have you tolerated food without any problems? Yes.    Have you been able to return to your normal activities? Yes.    Do you have any questions about your discharge instructions: Diet   No. Medications  No. Follow up visit  No.  Do you have questions or concerns about your Care? No.  Actions: * If pain score is 4 or above: No action needed, pain <4.

## 2018-06-13 NOTE — Telephone Encounter (Signed)
The pt has been scheduled for 07/04/18 to discuss procedure.  She will set up appt at that time after she discusses with DR Mansouraty

## 2018-06-13 NOTE — Telephone Encounter (Signed)
-----   Message from Irving Copas., MD sent at 06/13/2018  7:54 AM EST ----- Regarding: RE: Rectal EUS and possible ESD Thanks John for the patient notification. Daffney Greenly, can you arrange a 15-minute clinic visit to discuss EUS with possible EMR at time of procedure.  I would go ahead and look for a 90-minue Flex/EUS with EMR attempt in December, if I have any left that she wants, if not then January is fine. Thanks. Gabe ----- Message ----- From: Irene Shipper, MD Sent: 06/10/2018   1:12 PM EST To: Irving Copas., MD Subject: Rectal EUS and possible ESD                    Gabe, This very nice patient had an incidental submucosal rectal lesion identified on colonoscopy today.  Possibly carcinoid or something else.  I did tunnel biopsies, for whatever that is worth.  In any event, she needs rectal EUS and possible resection.  She was hoping to get this done before the end of the year because of insurance considerations.  If you would coordinate with your nurse to arrange and notify the patient, I would be grateful.  Keep me posted.  Thanks Jenny Reichmann

## 2018-06-20 ENCOUNTER — Encounter: Payer: Self-pay | Admitting: Internal Medicine

## 2018-07-04 ENCOUNTER — Encounter (INDEPENDENT_AMBULATORY_CARE_PROVIDER_SITE_OTHER): Payer: Self-pay

## 2018-07-04 ENCOUNTER — Other Ambulatory Visit (INDEPENDENT_AMBULATORY_CARE_PROVIDER_SITE_OTHER): Payer: BC Managed Care – PPO

## 2018-07-04 ENCOUNTER — Ambulatory Visit: Payer: BC Managed Care – PPO | Admitting: Gastroenterology

## 2018-07-04 ENCOUNTER — Encounter: Payer: Self-pay | Admitting: Gastroenterology

## 2018-07-04 VITALS — BP 122/70 | HR 72 | Ht 65.0 in | Wt 193.0 lb

## 2018-07-04 DIAGNOSIS — D3A026 Benign carcinoid tumor of the rectum: Secondary | ICD-10-CM | POA: Diagnosis not present

## 2018-07-04 DIAGNOSIS — R933 Abnormal findings on diagnostic imaging of other parts of digestive tract: Secondary | ICD-10-CM | POA: Diagnosis not present

## 2018-07-04 LAB — CBC
HCT: 46.6 % — ABNORMAL HIGH (ref 36.0–46.0)
Hemoglobin: 15.4 g/dL — ABNORMAL HIGH (ref 12.0–15.0)
MCHC: 33.1 g/dL (ref 30.0–36.0)
MCV: 86.5 fl (ref 78.0–100.0)
Platelets: 249 10*3/uL (ref 150.0–400.0)
RBC: 5.39 Mil/uL — ABNORMAL HIGH (ref 3.87–5.11)
RDW: 13.5 % (ref 11.5–15.5)
WBC: 6.7 10*3/uL (ref 4.0–10.5)

## 2018-07-04 LAB — BASIC METABOLIC PANEL
BUN: 21 mg/dL (ref 6–23)
CO2: 29 mEq/L (ref 19–32)
Calcium: 10.5 mg/dL (ref 8.4–10.5)
Chloride: 105 mEq/L (ref 96–112)
Creatinine, Ser: 0.84 mg/dL (ref 0.40–1.20)
GFR: 74.11 mL/min (ref >60.00)
Glucose, Bld: 100 mg/dL — ABNORMAL HIGH (ref 70–99)
Potassium: 4.5 mEq/L (ref 3.5–5.1)
Sodium: 141 mEq/L (ref 135–145)

## 2018-07-04 LAB — PROTIME-INR
INR: 1 ratio (ref 0.8–1.0)
Prothrombin Time: 11.9 s (ref 9.6–13.1)

## 2018-07-04 MED ORDER — PEG 3350-KCL-NABCB-NACL-NASULF 236 G PO SOLR: 4000.0000 mL | Freq: Once | ORAL | 0 refills | Status: AC

## 2018-07-04 NOTE — H&P (View-Only) (Signed)
[facility] VISIT   Primary Care Provider Rochel Brome, MD [place] [address] [phone]  Referring Provider Rochel Brome, MD [address] [place], [place] [address] [phone]  Patient Profile: Shannon [name] is a 57 y.o. female with a pmh significant for asthma, anxiety, hyperlipidemia, colon polyps, rectal carcinoid.  The patient presents to the [facility] for an evaluation and management of problem(s) noted below:  Problem List 1. Carcinoid tumor of rectum, unspecified whether malignant   2. Abnormal colonoscopy     History of Present Illness: This is the patient's first visit to outpatient of our GI clinic.  She presents for evaluation post colonoscopy with findings of a rectal carcinoid with desire for potential attempt at resection.  The colonoscopy was here for screening colonoscopy.  The patient has no other significant symptoms at this point in time.  Patient is not on blood thinners.  She has no significant abdominal pain.  She is experienced some issues of diarrhea in the past though this occurs infrequently not on a daily basis.  He does have hot flashes and periods of flushing that occur attributed to perimenopause/menopause.  GI Review of Systems Positive as above Negative for change in bowel habits, melena, hematochezia, abdominal pain, dysphagia, odynophagia, nausea, vomiting  Review of Systems General: Denies fevers/chills/weight loss HEENT: Denies oral lesions Cardiovascular: Denies chest pain Pulmonary: Denies shortness of breath Gastroenterological: See HPI Genitourinary: Denies darkened urine Hematological: Denies easy bruising/bleeding Endocrine: Denies temperature intolerance Dermatological: Denies jaundice Psychological: Mood is stable   Medications Current Outpatient Medications  Medication Sig Dispense Refill  . atorvastatin (LIPITOR) 20 MG tablet Take 1  tablet by mouth daily.    . busPIRone (BUSPAR) 10 MG tablet Take 1 tablet by mouth 2 (two) times daily.    . fluticasone (FLONASE) 50 MCG/ACT nasal spray Place into both nostrils daily as needed for allergies or rhinitis.    Marland Kitchen LORazepam (ATIVAN) 0.5 MG tablet Take 1 tablet by mouth daily.     No current facility-administered medications for this visit.     Allergies Allergies  Allergen Reactions  . Levaquin [Levofloxacin In D5w]     Nausea vomiting   . Other     Dog Dander     Histories Past Medical History:  Diagnosis Date  . Anxiety   . Asthma   . Emphysema of lung ([place])   . HLD (hyperlipidemia)   . Pneumonia    Past Surgical History:  Procedure Laterality Date  . ABLATION     endometrial  . DOPPLER ECHOCARDIOGRAPHY    . TEE WITHOUT CARDIOVERSION  02/2018  . TUBAL LIGATION  1987   Social History   Socioeconomic History  . Marital status: Married    Spouse name: Not on file  . Number of children: Not on file  . Years of education: Not on file  . Highest education level: Not on file  Occupational History  . Not on file  Social Needs  . Financial resource strain: Not on file  . Food insecurity:    Worry: Not on file    Inability: Not on file  . Transportation needs:    Medical: Not on file    Non-medical: Not on file  Tobacco Use  . Smoking status: Former Smoker    Years: 33.00  . Smokeless tobacco: Never Used  . Tobacco comment: quit 12 years ago  Substance and Sexual Activity  . Alcohol use:  Yes    Comment: occ- rare   . Drug use: Not Currently  . Sexual activity: Not on file  Lifestyle  . Physical activity:    Days per week: Not on file    Minutes per session: Not on file  . Stress: Not on file  Relationships  . Social connections:    Talks on phone: Not on file    Gets together: Not on file    Attends religious service: Not on file    Active member of club or organization: Not on file    Attends meetings of clubs or organizations: Not on  file    Relationship status: Not on file  . Intimate partner violence:    Fear of current or ex partner: Not on file    Emotionally abused: Not on file    Physically abused: Not on file    Forced sexual activity: Not on file  Other Topics Concern  . Not on file  Social History Narrative  . Not on file   Family History  Problem Relation Age of Onset  . Heart disease Mother   . COPD Mother   . Emphysema Mother   . Diabetes Father   . Colon cancer Neg Hx   . Colon polyps Neg Hx   . Esophageal cancer Neg Hx   . Rectal cancer Neg Hx   . Stomach cancer Neg Hx   I have reviewed her medical, social, and family history in detail and updated the electronic medical record as necessary.    PHYSICAL EXAMINATION  BP 122/70   Pulse 72   Ht _0  (1.651 m)   Wt 193 lb (87.5 kg)   BMI 32.12 kg/m  Wt Readings from Last 3 Encounters:  07/04/18 193 lb (87.5 kg)  06/10/18 194 lb (88 kg)  06/01/18 193 lb (87.5 kg)  GEN: NAD, appears stated age, doesn't appear chronically ill PSYCH: Cooperative, without pressured speech EYE: Conjunctivae pink, sclerae anicteric ENT: MMM, without oral ulcers, no erythema or exudates noted NECK: Supple CV: RR without R/Gs  RESP: CTAB posteriorly, without wheezing GI: NABS, soft, NT/ND, without rebound or guarding, no HSM appreciated MSK/EXT: No lower extremity edema SKIN: No jaundice NEURO:  Alert & Oriented x 3, no focal deficits   REVIEW OF DATA  I reviewed the following data at the time of this encounter:  GI Procedures and Studies  November 2019 colonoscopy - Two 2 to 5 mm polyps in the ascending colon, removed with a cold snare. Resected and retrieved. - Diverticulosis in the entire examined colon. - Bleeding external and internal hemorrhoids. - Likely benign small submucosal tumor in the rectum. Biopsied. - The exam was otherwise normal on direct and retroflexed views. Pathology Diagnosis 1. 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 - HYPERPLASTIC POLYP. 2. Surgical [P], submucosal rectal mass BX - RECTAL MUCOSA WITH WELL DIFFERENTIATED NEUROENDOCRINE (CARCINOID) TUMOR. SEE NOTE. - NO MITOTIC ACTIVITY OR NECROSIS IDENTIFIED. Diagnosis Note 2. Immunohistochemical stain for synaptophysin highlights the tumor cells. Ki-67 immunostain shows a proliferative index of less than 1%, consistent with above diagnosis.  Laboratory Studies  Reviewed in epic  Imaging Studies  No relevant studies to review   ASSESSMENT  Ms. Buerger is a 57 y.o. female  with a pmh significant for asthma, anxiety, hyperlipidemia, colon polyps, rectal carcinoid.  The patient is seen today for evaluation and management of:  1. Carcinoid tumor of rectum, unspecified whether malignant  2. Abnormal colonoscopy    The patient is hemodynamically and clinically stable at this point in time.  She has evidence of a rectal carcinoid that requires additional management.  Based upon the description and endoscopic pictures I do feel that it is reasonable to pursue an Advanced Polypectomy attempt of the polyp/lesion.  We discussed some of the techniques of advanced resection which include Endoscopic Mucosal Resection, OVESCO Full-Thickness Resection, Endorotor Morcellation, and Tissue Ablation via Fulguration.  The risks and benefits of endoscopic evaluation were discussed with the patient; these include but are not limited to the risk of perforation, infection, bleeding, missed lesions, lack of diagnosis, severe illness requiring hospitalization, as well as anesthesia and sedation related illnesses.  During attempts at advanced lesion resection, the risks of bleeding and perforation/leak are increased as opposed to diagnostic and screening colonoscopies, and that was discussed with the patient as well.  I did offer, a referral to surgery as well to discuss consideration of a surgical intervention for prior to  finalizing decision for attempt at endoscopic removal; the patient has deferred on this.  If, after attempt at removal of the lesion, it is found that the patient has a complication or that an invasive lesion or malignant lesion is found, or that the polyp continues to recur, the patient is aware and understands that a surgery may still be indicated/required.  We will proceed with a rectal ultrasound endoscopy to ensure there are no lymph nodes in the region and ensure that the lesion is within the third layer or above.  If it is not or is beginning in the muscularis I would strongly consider the use of an OVESCO full-thickness resection device at that point in time.  We will plan to have availability of either.  The risks of EUS including bleeding, infection, aspiration pneumonia and intestinal perforation were discussed as was the possibility it may not give a definitive diagnosis.  All patient questions were answered, to the best of my ability, and the patient agrees to the aforementioned plan of action with follow-up as indicated.   PLAN  Laboratories as below Proceed with rectal EUS/EMR attempt of rectal carcinoid  Orders Placed This Encounter  Procedures  . CBC  . Basic Metabolic Panel (BMET)  . INR/PT  . Ambulatory referral to Gastroenterology    New Prescriptions   No medications on file   Modified Medications   No medications on file    Planned Follow Up: No follow-ups on file.   [name], MD [facility] Advanced Endoscopy Office # [phone]

## 2018-07-04 NOTE — Progress Notes (Signed)
[place] VISIT   Primary Care Provider Rochel Brome, MD [place] [address] [phone]  Referring Provider Rochel Brome, MD [address] [place], [place] [address] [phone]  Patient Profile: Shannon [name] is a 57 y.o. female with a pmh significant for asthma, anxiety, hyperlipidemia, colon polyps, rectal carcinoid.  The patient presents to the [facility] for an evaluation and management of problem(s) noted below:  Problem List 1. Carcinoid tumor of rectum, unspecified whether malignant   2. Abnormal colonoscopy     History of Present Illness: This is the patient's first visit to outpatient of our GI clinic.  She presents for evaluation post colonoscopy with findings of a rectal carcinoid with desire for potential attempt at resection.  The colonoscopy was here for screening colonoscopy.  The patient has no other significant symptoms at this point in time.  Patient is not on blood thinners.  She has no significant abdominal pain.  She is experienced some issues of diarrhea in the past though this occurs infrequently not on a daily basis.  He does have hot flashes and periods of flushing that occur attributed to perimenopause/menopause.  GI Review of Systems Positive as above Negative for change in bowel habits, melena, hematochezia, abdominal pain, dysphagia, odynophagia, nausea, vomiting  Review of Systems General: Denies fevers/chills/weight loss HEENT: Denies oral lesions Cardiovascular: Denies chest pain Pulmonary: Denies shortness of breath Gastroenterological: See HPI Genitourinary: Denies darkened urine Hematological: Denies easy bruising/bleeding Endocrine: Denies temperature intolerance Dermatological: Denies jaundice Psychological: Mood is stable   Medications Current Outpatient Medications  Medication Sig Dispense Refill  . atorvastatin (LIPITOR) 20 MG tablet Take 1  tablet by mouth daily.    . busPIRone (BUSPAR) 10 MG tablet Take 1 tablet by mouth 2 (two) times daily.    . fluticasone (FLONASE) 50 MCG/ACT nasal spray Place into both nostrils daily as needed for allergies or rhinitis.    Marland Kitchen LORazepam (ATIVAN) 0.5 MG tablet Take 1 tablet by mouth daily.     No current facility-administered medications for this visit.     Allergies Allergies  Allergen Reactions  . Levaquin [Levofloxacin In D5w]     Nausea vomiting   . Other     Dog Dander     Histories Past Medical History:  Diagnosis Date  . Anxiety   . Asthma   . Emphysema of lung ([place])   . HLD (hyperlipidemia)   . Pneumonia    Past Surgical History:  Procedure Laterality Date  . ABLATION     endometrial  . DOPPLER ECHOCARDIOGRAPHY    . TEE WITHOUT CARDIOVERSION  02/2018  . TUBAL LIGATION  1987   Social History   Socioeconomic History  . Marital status: Married    Spouse name: Not on file  . Number of children: Not on file  . Years of education: Not on file  . Highest education level: Not on file  Occupational History  . Not on file  Social Needs  . Financial resource strain: Not on file  . Food insecurity:    Worry: Not on file    Inability: Not on file  . Transportation needs:    Medical: Not on file    Non-medical: Not on file  Tobacco Use  . Smoking status: Former Smoker    Years: 33.00  . Smokeless tobacco: Never Used  . Tobacco comment: quit 12 years ago  Substance and Sexual Activity  . Alcohol use:  Yes    Comment: occ- rare   . Drug use: Not Currently  . Sexual activity: Not on file  Lifestyle  . Physical activity:    Days per week: Not on file    Minutes per session: Not on file  . Stress: Not on file  Relationships  . Social connections:    Talks on phone: Not on file    Gets together: Not on file    Attends religious service: Not on file    Active member of club or organization: Not on file    Attends meetings of clubs or organizations: Not on  file    Relationship status: Not on file  . Intimate partner violence:    Fear of current or ex partner: Not on file    Emotionally abused: Not on file    Physically abused: Not on file    Forced sexual activity: Not on file  Other Topics Concern  . Not on file  Social History Narrative  . Not on file   Family History  Problem Relation Age of Onset  . Heart disease Mother   . COPD Mother   . Emphysema Mother   . Diabetes Father   . Colon cancer Neg Hx   . Colon polyps Neg Hx   . Esophageal cancer Neg Hx   . Rectal cancer Neg Hx   . Stomach cancer Neg Hx   I have reviewed her medical, social, and family history in detail and updated the electronic medical record as necessary.    PHYSICAL EXAMINATION  BP 122/70   Pulse 72   Ht _0  (1.651 m)   Wt 193 lb (87.5 kg)   BMI 32.12 kg/m  Wt Readings from Last 3 Encounters:  07/04/18 193 lb (87.5 kg)  06/10/18 194 lb (88 kg)  06/01/18 193 lb (87.5 kg)  GEN: NAD, appears stated age, doesn't appear chronically ill PSYCH: Cooperative, without pressured speech EYE: Conjunctivae pink, sclerae anicteric ENT: MMM, without oral ulcers, no erythema or exudates noted NECK: Supple CV: RR without R/Gs  RESP: CTAB posteriorly, without wheezing GI: NABS, soft, NT/ND, without rebound or guarding, no HSM appreciated MSK/EXT: No lower extremity edema SKIN: No jaundice NEURO:  Alert & Oriented x 3, no focal deficits   REVIEW OF DATA  I reviewed the following data at the time of this encounter:  GI Procedures and Studies  November 2019 colonoscopy - Two 2 to 5 mm polyps in the ascending colon, removed with a cold snare. Resected and retrieved. - Diverticulosis in the entire examined colon. - Bleeding external and internal hemorrhoids. - Likely benign small submucosal tumor in the rectum. Biopsied. - The exam was otherwise normal on direct and retroflexed views. Pathology Diagnosis 1. 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 - HYPERPLASTIC POLYP. 2. Surgical [P], submucosal rectal mass BX - RECTAL MUCOSA WITH WELL DIFFERENTIATED NEUROENDOCRINE (CARCINOID) TUMOR. SEE NOTE. - NO MITOTIC ACTIVITY OR NECROSIS IDENTIFIED. Diagnosis Note 2. Immunohistochemical stain for synaptophysin highlights the tumor cells. Ki-67 immunostain shows a proliferative index of less than 1%, consistent with above diagnosis.  Laboratory Studies  Reviewed in epic  Imaging Studies  No relevant studies to review   ASSESSMENT  Ms. Buerger is a 57 y.o. female  with a pmh significant for asthma, anxiety, hyperlipidemia, colon polyps, rectal carcinoid.  The patient is seen today for evaluation and management of:  1. Carcinoid tumor of rectum, unspecified whether malignant  2. Abnormal colonoscopy    The patient is hemodynamically and clinically stable at this point in time.  She has evidence of a rectal carcinoid that requires additional management.  Based upon the description and endoscopic pictures I do feel that it is reasonable to pursue an Advanced Polypectomy attempt of the polyp/lesion.  We discussed some of the techniques of advanced resection which include Endoscopic Mucosal Resection, OVESCO Full-Thickness Resection, Endorotor Morcellation, and Tissue Ablation via Fulguration.  The risks and benefits of endoscopic evaluation were discussed with the patient; these include but are not limited to the risk of perforation, infection, bleeding, missed lesions, lack of diagnosis, severe illness requiring hospitalization, as well as anesthesia and sedation related illnesses.  During attempts at advanced lesion resection, the risks of bleeding and perforation/leak are increased as opposed to diagnostic and screening colonoscopies, and that was discussed with the patient as well.  I did offer, a referral to surgery as well to discuss consideration of a surgical intervention for prior to  finalizing decision for attempt at endoscopic removal; the patient has deferred on this.  If, after attempt at removal of the lesion, it is found that the patient has a complication or that an invasive lesion or malignant lesion is found, or that the polyp continues to recur, the patient is aware and understands that a surgery may still be indicated/required.  We will proceed with a rectal ultrasound endoscopy to ensure there are no lymph nodes in the region and ensure that the lesion is within the third layer or above.  If it is not or is beginning in the muscularis I would strongly consider the use of an OVESCO full-thickness resection device at that point in time.  We will plan to have availability of either.  The risks of EUS including bleeding, infection, aspiration pneumonia and intestinal perforation were discussed as was the possibility it may not give a definitive diagnosis.  All patient questions were answered, to the best of my ability, and the patient agrees to the aforementioned plan of action with follow-up as indicated.   PLAN  Laboratories as below Proceed with rectal EUS/EMR attempt of rectal carcinoid  Orders Placed This Encounter  Procedures  . CBC  . Basic Metabolic Panel (BMET)  . INR/PT  . Ambulatory referral to Gastroenterology    New Prescriptions   No medications on file   Modified Medications   No medications on file    Planned Follow Up: No follow-ups on file.   [name], MD [facility] Advanced Endoscopy Office # [phone]

## 2018-07-04 NOTE — Patient Instructions (Addendum)
Rectal Carcinoid/Rectal Neuroendocrine.  Based upon the description and endoscopic pictures I do feel that it is reasonable to pursue an Advanced Polypectomy attempt of the polyp/lesion.  We discussed some of the techniques of advanced polypectomy which include Endoscopic Mucosal Resection, OVESCO Full-Thickness Resection, Endorotor Morcellation, and Tissue Ablation via Fulguration.  The risks and benefits of endoscopic evaluation were discussed with the patient; these include but are not limited to the risk of perforation, infection, bleeding, missed lesions, lack of diagnosis, severe illness requiring hospitalization, as well as anesthesia and sedation related illnesses.  During attempts at advanced polypectomy, the risks of bleeding and perforation/leak are increased as opposed to diagnostic and screening colonoscopies, and that was discussed with the patient as well.  I did offer, a referral to surgery as well to discuss consideration of a surgical intervention for prior to finalizing decision for attempt at endoscopic removal; the patient has deferred on this.  If, after attempt at removal of the polyp, it is found that the patient has a complication or that an invasive lesion or malignant lesion is found, or that the polyp continues to recur, the patient is aware and understands that a surgery may still be indicated/required.  In addition, with the possible need for piecemeal resection, subsequent short-interval colonoscopies for follow up and treatment of the lesion may be necessary and was also explained.  All patient questions were answered, to the best of my ability, and the patient agrees to the aforementioned plan of action with follow-up as indicated.  You have been scheduled for a Flex/EUS/EMR. Please follow written instructions given to you at your visit today.  Please pick up your prep supplies at the pharmacy within the next 1-3 days. If you use inhalers (even only as needed), please bring  them with you on the day of your procedure. Your physician has requested that you go to www.startemmi.com and enter the access code given to you at your visit today. This web site gives a general overview about your procedure. However, you should still follow specific instructions given to you by our office regarding your preparation for the procedure.  Your provider has requested that you go to the basement level for lab work before leaving today. Press "B" on the elevator. The lab is located at the first door on the left as you exit the elevator.   We have sent the following medications to your pharmacy for you to pick up at your convenience: Golytely   If you are age 72 or older, your body mass index should be between 23-30. Your Body mass index is 32.12 kg/m. If this is out of the aforementioned range listed, please consider follow up with your Primary Care Provider.  If you are age 80 or younger, your body mass index should be between 19-25. Your Body mass index is 32.12 kg/m. If this is out of the aformentioned range listed, please consider follow up with your Primary Care Provider.   Thank you for choosing me and [facility].   [name], MD

## 2018-07-07 ENCOUNTER — Encounter: Payer: Self-pay | Admitting: Gastroenterology

## 2018-07-07 DIAGNOSIS — R933 Abnormal findings on diagnostic imaging of other parts of digestive tract: Secondary | ICD-10-CM | POA: Insufficient documentation

## 2018-07-07 DIAGNOSIS — D3A026 Benign carcinoid tumor of the rectum: Secondary | ICD-10-CM | POA: Insufficient documentation

## 2018-07-14 ENCOUNTER — Other Ambulatory Visit: Payer: Self-pay

## 2018-07-14 ENCOUNTER — Encounter (HOSPITAL_COMMUNITY): Payer: Self-pay | Admitting: *Deleted

## 2018-07-14 NOTE — Progress Notes (Signed)
Spoke with pt for pre-op call. Pt denies cardiac history. She was worked up in August by Dr. Johnsie Cancel for chest pain. All tests were normal. Pt states she is not diabetic and does not have HTN.   EKG - 03/14/18 - in Epic Echo - 03/23/18 - in [facility] test - 03/23/18 - in Epic

## 2018-07-15 ENCOUNTER — Other Ambulatory Visit: Payer: Self-pay

## 2018-07-15 DIAGNOSIS — D3A026 Benign carcinoid tumor of the rectum: Secondary | ICD-10-CM

## 2018-07-18 ENCOUNTER — Ambulatory Visit (HOSPITAL_COMMUNITY)
Admission: RE | Admit: 2018-07-18 | Discharge: 2018-07-18 | Disposition: A | Discharge status: Home / Self Care | Payer: BC Managed Care – PPO | Attending: Gastroenterology | Admitting: Gastroenterology

## 2018-07-18 ENCOUNTER — Encounter (HOSPITAL_COMMUNITY): Payer: Self-pay | Admitting: *Deleted

## 2018-07-18 ENCOUNTER — Encounter (HOSPITAL_COMMUNITY)
Admission: RE | Disposition: A | Discharge status: Home / Self Care | Payer: Self-pay | Source: Home / Self Care | Attending: Gastroenterology

## 2018-07-18 ENCOUNTER — Ambulatory Visit (HOSPITAL_COMMUNITY): Payer: BC Managed Care – PPO | Admitting: Certified Registered Nurse Anesthetist

## 2018-07-18 DIAGNOSIS — E785 Hyperlipidemia, unspecified: Secondary | ICD-10-CM | POA: Diagnosis not present

## 2018-07-18 DIAGNOSIS — Z8719 Personal history of other diseases of the digestive system: Secondary | ICD-10-CM | POA: Insufficient documentation

## 2018-07-18 DIAGNOSIS — Z86010 Personal history of colonic polyps: Secondary | ICD-10-CM | POA: Diagnosis not present

## 2018-07-18 DIAGNOSIS — I899 Noninfective disorder of lymphatic vessels and lymph nodes, unspecified: Secondary | ICD-10-CM

## 2018-07-18 DIAGNOSIS — D3A026 Benign carcinoid tumor of the rectum: Secondary | ICD-10-CM | POA: Insufficient documentation

## 2018-07-18 DIAGNOSIS — J439 Emphysema, unspecified: Secondary | ICD-10-CM | POA: Diagnosis not present

## 2018-07-18 DIAGNOSIS — K5730 Diverticulosis of large intestine without perforation or abscess without bleeding: Secondary | ICD-10-CM | POA: Diagnosis not present

## 2018-07-18 DIAGNOSIS — K640 First degree hemorrhoids: Secondary | ICD-10-CM

## 2018-07-18 DIAGNOSIS — Z7951 Long term (current) use of inhaled steroids: Secondary | ICD-10-CM | POA: Diagnosis not present

## 2018-07-18 DIAGNOSIS — Z87891 Personal history of nicotine dependence: Secondary | ICD-10-CM | POA: Diagnosis not present

## 2018-07-18 DIAGNOSIS — Z79899 Other long term (current) drug therapy: Secondary | ICD-10-CM | POA: Insufficient documentation

## 2018-07-18 DIAGNOSIS — E669 Obesity, unspecified: Secondary | ICD-10-CM | POA: Diagnosis not present

## 2018-07-18 DIAGNOSIS — F419 Anxiety disorder, unspecified: Secondary | ICD-10-CM | POA: Diagnosis not present

## 2018-07-18 DIAGNOSIS — Z6832 Body mass index (BMI) 32.0-32.9, adult: Secondary | ICD-10-CM | POA: Insufficient documentation

## 2018-07-18 DIAGNOSIS — K629 Disease of anus and rectum, unspecified: Secondary | ICD-10-CM | POA: Diagnosis present

## 2018-07-18 HISTORY — DX: Dyspnea, unspecified: R06.00

## 2018-07-18 HISTORY — DX: Neoplasm of unspecified behavior of digestive system: D49.0

## 2018-07-18 HISTORY — PX: EUS: SHX5427

## 2018-07-18 HISTORY — PX: FLEXIBLE SIGMOIDOSCOPY: SHX5431

## 2018-07-18 SURGERY — SIGMOIDOSCOPY, FLEXIBLE
Anesthesia: Monitor Anesthesia Care

## 2018-07-18 MED ORDER — LACTATED RINGERS IV SOLN
INTRAVENOUS | Status: DC | PRN
  Administered 2018-07-18: 08:00:00 via INTRAVENOUS

## 2018-07-18 MED ORDER — LIDOCAINE 2% (20 MG/ML) 5 ML SYRINGE
INTRAMUSCULAR | Status: DC | PRN
  Administered 2018-07-18: 60 mg via INTRAVENOUS

## 2018-07-18 MED ORDER — PROPOFOL 10 MG/ML IV BOLUS
INTRAVENOUS | Status: DC | PRN
  Administered 2018-07-18 (×3): 10 mg via INTRAVENOUS
  Administered 2018-07-18: 20 mg via INTRAVENOUS
  Administered 2018-07-18: 10 mg via INTRAVENOUS

## 2018-07-18 MED ORDER — ONDANSETRON HCL 4 MG/2ML IJ SOLN
INTRAMUSCULAR | Status: DC | PRN
  Administered 2018-07-18: 4 mg via INTRAVENOUS

## 2018-07-18 MED ORDER — PROPOFOL 500 MG/50ML IV EMUL
INTRAVENOUS | Status: DC | PRN
  Administered 2018-07-18: 09:00:00 via INTRAVENOUS
  Administered 2018-07-18: 75 ug/kg/min via INTRAVENOUS

## 2018-07-18 MED ORDER — EPHEDRINE SULFATE-NACL 50-0.9 MG/10ML-% IV SOSY
PREFILLED_SYRINGE | INTRAVENOUS | Status: DC | PRN
  Administered 2018-07-18 (×2): 5 mg via INTRAVENOUS

## 2018-07-18 MED ORDER — SODIUM CHLORIDE 0.9 % IV SOLN: INTRAVENOUS | Status: DC

## 2018-07-18 NOTE — Anesthesia Preprocedure Evaluation (Addendum)
Anesthesia Evaluation  Patient identified by MRN, date of birth, ID band Patient awake    Reviewed: Allergy & Precautions, NPO status , Patient's Chart, lab work & pertinent test results  Airway Mallampati: II  TM Distance: >3 FB Neck ROM: Full    Dental no notable dental hx. (+) Teeth Intact   Pulmonary shortness of breath and with exertion, asthma , pneumonia, resolved, COPD,  COPD inhaler, former smoker,    Pulmonary exam normal breath sounds clear to auscultation       Cardiovascular negative cardio ROS Normal cardiovascular exam Rhythm:Regular Rate:Normal     Neuro/Psych Anxiety negative neurological ROS     GI/Hepatic Neg liver ROS, Rectal Carcinoid   Endo/Other  Obesity  Renal/GU negative Renal ROS  negative genitourinary   Musculoskeletal negative musculoskeletal ROS (+)   Abdominal   Peds  Hematology negative hematology ROS (+)   Anesthesia Other Findings   Reproductive/Obstetrics                            Anesthesia Physical Anesthesia Plan  ASA: II  Anesthesia Plan: MAC   Post-op Pain Management:    Induction: Intravenous  PONV Risk Score and Plan: 3 and Ondansetron, Propofol infusion, Midazolam and Treatment may vary due to age or medical condition  Airway Management Planned: Natural Airway and Simple Face Mask  Additional Equipment:   Intra-op Plan:   Post-operative Plan:   Informed Consent: I have reviewed the patients History and Physical, chart, labs and discussed the procedure including the risks, benefits and alternatives for the proposed anesthesia with the patient or authorized representative who has indicated his/her understanding and acceptance.   Dental advisory given  Plan Discussed with: CRNA and Surgeon  Anesthesia Plan Comments:         Anesthesia Quick Evaluation

## 2018-07-18 NOTE — Anesthesia Procedure Notes (Signed)
Procedure Name: MAC Date/Time: 07/18/2018 8:20 AM Performed by: Candis Shine, CRNA Pre-anesthesia Checklist: Patient identified, Emergency Drugs available, Suction available, Patient being monitored and Timeout performed Patient Re-evaluated:Patient Re-evaluated prior to induction Oxygen Delivery Method: Simple face mask Dental Injury: Teeth and Oropharynx as per pre-operative assessment

## 2018-07-18 NOTE — Interval H&P Note (Signed)
History and Physical Interval Note:  07/18/2018 7:23 AM  Shannon [name]  has presented today for surgery, with the diagnosis of rectal lesion  The various methods of treatment have been discussed with the patient and family. After consideration of risks, benefits and other options for treatment, the patient has consented to  Procedure(s): FLEXIBLE SIGMOIDOSCOPY (N/A) LOWER ENDOSCOPIC ULTRASOUND (EUS) (N/A) ENDOSCOPIC MUCOSAL RESECTION (N/A) as a surgical intervention .  The patient's history has been reviewed, patient examined, no change in status, stable for surgery.  I have reviewed the patient's chart and labs.  Questions were answered to the patient's satisfaction.     [employer]

## 2018-07-18 NOTE — Op Note (Signed)
[facility] Patient Name: Shannon [name] Procedure Date : 07/18/2018 MRN: [id number] Attending MD: [name] , MD Date of Birth: [birth year] CSN: [id number] Age: 57 Admit Type: Outpatient Procedure:                Lower EUS Indications:              Rectal deformity found on endoscopy; subepithelial                            tumor versus extrinsic compression Providers:                [name], MD, Carlyn Reichert, RN, Elspeth Cho Tech., Technician, Dellie Catholic, CRNA Referring MD:             Docia Chuck. [name], MD Medicines:                Monitored Anesthesia Care Complications:            No immediate complications. Estimated Blood Loss:     Estimated blood loss: none. Procedure:                Pre-Anesthesia Assessment:                           - Prior to the procedure, a History and Physical                            was performed, and patient medications and                            allergies were reviewed. The patient's tolerance of                            previous anesthesia was also reviewed. The risks                            and benefits of the procedure and the sedation                            options and risks were discussed with the patient.                            All questions were answered, and informed consent                            was obtained. Prior Anticoagulants: The patient has                            taken previous NSAID medication. ASA Grade                            Assessment: II - A patient with mild systemic  disease. After reviewing the risks and benefits,                            the patient was deemed in satisfactory condition to                            undergo the procedure.                           After obtaining informed consent, the endoscope was                            passed under direct vision. Throughout the         procedure, the patient's blood pressure, pulse, and                            oxygen saturations were monitored continuously. The                            [id number] ([id number]) Olympus adult colon was                            introduced through the anus and advanced to the the                            descending colon. The [id number] ([id number])                            Olympus Radial EUS was introduced through the anus                            and advanced to the the descending colon. After                            obtaining informed consent, the endoscope was                            passed under direct vision. Throughout the                            procedure, the patient's blood pressure, pulse, and                            oxygen saturations were monitored continuously. The                            [id number] ([id number]) Olympus EGD Therapeutic was                            introduced through the anus and advanced to the the                            rectosigmoid junction. The lower EUS was  accomplished without difficulty. The patient                            tolerated the procedure. The quality of the bowel                            preparation was good. Scope In: 8:34:36 AM Scope Out: 9:42:10 AM Total Procedure Duration: 1 hour 7 minutes 34 seconds  Findings:      Non-bleeding non-thrombosed external and internal hemorrhoids were found       during retroflexion. The hemorrhoids were Grade I (internal hemorrhoids       that do not prolapse).      ENDOSCOPIC FINDING: :      The digital rectal exam findings include non-thrombosed external       hemorrhoids and skin tags. Pertinent negatives include no palpable       rectal lesions.      One 12 mm submucosal nodule was found in the mid rectum. After the EUS       was completed, as noted below, preparations were made for mucosal       resection. [place] Orise Gel was injected to  raise the lesion. Band       ligator and snare mucosal resection was performed. Resection and       retrieval were complete. The muscularis was stained blue. To close the       defect after mucosal resection, three hemostatic clips were successfully       placed (MR conditional). There was no bleeding during, or at the end, of       the procedure.      Many small and large-mouthed diverticula were found in the recto-sigmoid       colon, sigmoid colon and descending colon.      ENDOSONOGRAPHIC FINDING: :      A round intramural (subepithelial) lesion was found in the rectum. The       lesion was encountered at 8 cm (from the anal verge). The lesion was       hypoechoic. Sonographically, the origin appeared to be within the deep       mucosa (Layer 2). No additional wall layers were involved. The lesion       measured up to 11.6 mm in thickness by 8.4 mm in thickness. The       endosonographic borders were well-defined.      No malignant-appearing lymph nodes were visualized in the perirectal       region and in the left iliac region. They were observed during       endosonographic examination of the rectum, sigmoid colon and descending       colon. The nodes were.      The internal anal sphincter was visualized endosonographically and       appeared normal. Impression:               FLEX IMPRESSION                           - Non-thrombosed external hemorrhoids and skin tags                            found on digital rectal exam.                           -  Diverticulosis in the recto-sigmoid colon, in the                            sigmoid colon and in the descending colon.                           - Submucosal nodule in the mid rectum - consistent                            with previous Carcinoid. After EUS, complete                            removal was accomplished via Snare Band Ligation &                            Lift. Clips (MR conditional) were placed to close                             defect.                           - Non-bleeding non-thrombosed external and internal                            hemorrhoids.                           EUS IMPRESSION                           - An intramural (subepithelial) lesion was                            visualized endosonographically in the rectum. The                            origin of the lesion appeared to be within the deep                            mucosa (Layer 2). A tissue diagnosis was obtained                            prior to this exam. This is benign carcinoid.                           - No malignant-appearing lymph nodes were                            visualized endosonographically in the perirectal                            region and in the left iliac region.                           - The internal anal sphincter was visualized  endosonographically and appeared normal. Recommendation:           - The patient will be observed post-procedure,                            until all discharge criteria are met.                           - Discharge patient to home.                           - Await path results.                           - After results return, we will need to have                            further discussions and potentially [place] discussion                            as to follow up. Normally in Rectal carcinoids that                            are less than 1 cm the risk of LN metastasis in the                            lifetime of a patient is <1-2%. We know that in                            Rectal Carcinoids that are >2 cm the risk of LN                            metastasis in the lifetime of a patient is 15-20%                            in some case series. Those lesions that are between                            1-2 cm in size (as this lesion was) will have a                            risk that we believe can be between 1-15%. This                             lesion was just within Layer 2 on EUS and not in                            the Muscularis, so I think, the likelihood is that                            her risk will be on the lower end, but we will have  to determine possible role of imaging in the future                            and follow up, based on hopefully a complete                            resection margin.                           - The findings and recommendations were discussed                            with the patient.                           - The findings and recommendations were discussed                            with the patient's family. 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 All rights reserved. The codes documented in this report are preliminary and upon coder review may  be revised to meet current compliance requirements.  [name], MD 07/18/2018 10:11:37 AM Number of Addenda: 0

## 2018-07-18 NOTE — Transfer of Care (Signed)
Immediate Anesthesia Transfer of Care Note  Patient: [name]  Procedure(s) Performed: FLEXIBLE SIGMOIDOSCOPY (N/A ) LOWER ENDOSCOPIC ULTRASOUND (EUS) (N/A ) ENDOSCOPIC MUCOSAL RESECTION (N/A ) SUBMUCOSAL LIFTING INJECTION HEMOSTASIS CLIP PLACEMENT  Patient Location: Endoscopy Unit  Anesthesia Type:MAC  Level of Consciousness: awake, alert  and oriented  Airway & Oxygen Therapy: Patient Spontanous Breathing and Patient connected to face mask oxygen  Post-op Assessment: Report given to RN and Post -op Vital signs reviewed and stable  Post vital signs: Reviewed and stable  Last Vitals:  Vitals Value Taken Time  BP 98/79 07/18/2018  9:50 AM  Temp    Pulse    Resp 19 07/18/2018  9:51 AM  SpO2    Vitals shown include unvalidated device data.  Last Pain:  Vitals:   07/18/18 0705  TempSrc: Oral  PainSc: 0-No pain         Complications: No apparent anesthesia complications

## 2018-07-18 NOTE — Discharge Instructions (Signed)

## 2018-07-18 NOTE — Anesthesia Postprocedure Evaluation (Signed)
Anesthesia Post Note  Patient: Shannon [name]  Procedure(s) Performed: FLEXIBLE SIGMOIDOSCOPY (N/A ) LOWER ENDOSCOPIC ULTRASOUND (EUS) (N/A ) ENDOSCOPIC MUCOSAL RESECTION (N/A ) [place] PLACEMENT     Patient location during evaluation: PACU Anesthesia Type: MAC Level of consciousness: awake and alert and oriented Pain management: pain level controlled Vital Signs Assessment: post-procedure vital signs reviewed and stable Respiratory status: spontaneous breathing, nonlabored ventilation and respiratory function stable Cardiovascular status: stable and blood pressure returned to baseline Postop Assessment: no apparent nausea or vomiting Anesthetic complications: no    Last Vitals:  Vitals:   07/18/18 1025 07/18/18 1030  BP: (!) 160/85   Pulse: 68 65  Resp: 14 13  Temp:    SpO2: 100% 97%    Last Pain:  Vitals:   07/18/18 1025  TempSrc:   PainSc: 0-No pain                 Everleigh Colclasure A.

## 2018-07-30 ENCOUNTER — Encounter: Payer: Self-pay | Admitting: Gastroenterology

## 2018-08-01 ENCOUNTER — Telehealth: Payer: Self-pay | Admitting: Gastroenterology

## 2018-08-01 NOTE — Telephone Encounter (Signed)
Dr Rush Landmark the pt has further questions regarding her pathology.  She prefers for you to call her to discuss.

## 2018-08-01 NOTE — Telephone Encounter (Signed)
Pt has some questions about her results, she spoke with Dr. Rush Landmark on Friday and has few questions. Pls call her.

## 2018-08-01 NOTE — Telephone Encounter (Signed)
I called and spoke with the patient this morning about her questions. She has a better understanding currently. I will update her after our discussion at [facility] this week or next week.

## 2018-08-04 ENCOUNTER — Telehealth: Payer: Self-pay | Admitting: Gastroenterology

## 2018-08-04 DIAGNOSIS — D3A026 Benign carcinoid tumor of the rectum: Secondary | ICD-10-CM

## 2018-08-04 NOTE — Telephone Encounter (Signed)
[facility] referral made and records faxed

## 2018-08-04 NOTE — Addendum Note (Signed)
Addended by: [name] on: 08/04/2018 04:47 PM   Modules accepted: Orders

## 2018-08-04 NOTE — Telephone Encounter (Signed)
I was able to speak with the patient this afternoon. We discussed the results and discussion of multidisciplinary conference. We agreed to move forward with a dotatate scan. She agreed to move forward with a colorectal surgery referral. Based on discussion with colorectal surgery as well as the results of the dotatate scan will determine the need for surveillance of this region versus any other surgical intervention as necessary. Patty, I have gone ahead and ordered the dotatate scan.  Can you look into this later this week or next week to make sure that it gets scheduled.  There is no emergency or urgency and this can be done in the next few weeks as able.  Ideally however, it gets done before the colorectal surgery clinic visit. Patient appreciative for all the care she has received at [facility].   [name], MD [facility] Office # [phone]

## 2018-08-04 NOTE — Telephone Encounter (Signed)
This patient's case was discussed at multidisciplinary conference on 08/03/2018. Her pathology was reviewed and there is still concern for evidence of carcinoid right to the margin of the cauterized edge as well as lymphovascular invasion.  Proliferation index remains less than 1% on the larger sample.  The consensus in the room was that it is likely we have resected the lesion but as this can and is normally an indolent type of lesion that likelihood is she will never have any issues but surveillance will be required. Due to the lymphovascular invasion (which is most concerning) and her overall significantly good health the thought process from everyone in the group was that further imaging with a dotatate scan would be reasonable to evaluate if there was lymph node activity in any other region. A referral to colorectal surgery was also recommended and I agree with this to discuss what our surveillance regimen will be for her. After discussion with colorectal surgery and with the scan we will consider the role of an repeat endoscopic evaluation to see if the region shows any evidence of persistent disease.  I tried to call the patient this afternoon but left a voicemail for call back as she did not answer. I will try and reach her this afternoon before I leave on scheduled leave.  And away on scheduled leave.  [name], MD [facility] # [phone]

## 2018-08-04 NOTE — Telephone Encounter (Signed)
Gabe, I concur with the plan and appreciate the update.  As well I appreciate you communicating this information directly with the patient.  Jenny Reichmann

## 2018-08-05 NOTE — Telephone Encounter (Signed)
Amy can you let me know when this has a precert number.  They will not schedule without it.  Thanks.

## 2018-08-05 NOTE — Telephone Encounter (Signed)
Sure.  I just need to know where it will be scheduled.  I have to put a place of service in the portal when I'm doing the precert.

## 2018-08-05 NOTE — Telephone Encounter (Signed)
[facility]

## 2018-08-08 NOTE — Telephone Encounter (Signed)
Dr. Rush Landmark, I believe the issue is with the diagnosis code.   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  Think maybe changing to a cancer diagnosis code and confirming that the biopsy indeed confirmed a cancer diagnosis would help.  If not, I can start up the case on Wednesday and you can do a peer to peer on Thursday.

## 2018-08-08 NOTE — Telephone Encounter (Signed)
Shannon [name], tried to get approved with info we have so far and can not get it approved without biopsy proven cancer diagnosis, and they usually want to know the cancer cell type.  The biopsy that was done does not support a biopsy proven diagnosis of cancer. (left you a voice mail)

## 2018-08-08 NOTE — Telephone Encounter (Signed)
Good afternoon Shannon [name]  Have you had a chance to get an auth number for this PET scan?

## 2018-08-08 NOTE — Telephone Encounter (Signed)
This patient was discussed at the Multidisciplinary conference with Radiology, Oncology, Surgery and GI.   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  This is a [facility] GI tumor. I am away until Thursday when I will be back in [place]. What  other information would be required? Thank you. GM

## 2018-08-08 NOTE — Telephone Encounter (Signed)
Dr Rush Landmark please see the note per [facility] pre cert coordinator.

## 2018-08-08 NOTE — Telephone Encounter (Signed)
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 If any further issues then please set up for Thursday a peer to peer.   Thanks. GM

## 2018-08-09 NOTE — Telephone Encounter (Signed)
The patient has been notified of this information and all questions answered. The pt has been advised of the information and verbalized understanding.    

## 2018-08-09 NOTE — Telephone Encounter (Signed)
The pt has been scheduled for 08/17/18 at 3 pm to arrive at 230 pm and NPO 4 hours prior.  She will check in at [facility] radiology

## 2018-08-09 NOTE — Telephone Encounter (Signed)
Thank you Dr. Rush Landmark  for new code.  I did get the Shannon [name]. bcbs auth# [id number] good 08/09/18-09/07/18 Patient can now be scheduled.  Forwarding to [employer]

## 2018-08-09 NOTE — Telephone Encounter (Signed)
Left message on machine to call back  

## 2018-08-17 ENCOUNTER — Ambulatory Visit (HOSPITAL_COMMUNITY)
Admission: RE | Admit: 2018-08-17 | Discharge: 2018-08-17 | Disposition: A | Discharge status: Home / Self Care | Payer: BC Managed Care – PPO | Source: Ambulatory Visit | Attending: Gastroenterology | Admitting: Gastroenterology

## 2018-08-17 DIAGNOSIS — D3A026 Benign carcinoid tumor of the rectum: Secondary | ICD-10-CM | POA: Diagnosis present

## 2018-08-17 MED ORDER — GALLIUM GA 68 DOTATATE IV KIT
5.1000 | PACK | Freq: Once | INTRAVENOUS | Status: AC
  Administered 2018-08-17: 5.1 via INTRAVENOUS

## 2018-08-18 ENCOUNTER — Telehealth: Payer: Self-pay | Admitting: Gastroenterology

## 2018-08-18 NOTE — Telephone Encounter (Signed)
I called and spoke with the patient about the results of her dotatate scan.  Within the abdomen and pelvis there is no evidence of any metastatic disease or concerning findings of lymph nodes.  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  I spoke with the patient at length and she also has an upcoming clinic visit with Dr. Leighton Ruff from colorectal surgery to discuss the possible role of surgical intervention versus high risk surveillance in the setting of her rectal carcinoid with lymphovascular invasion  That is scheduled for next week. I think normally what I would do would be recommending a six-month follow-up EUS to reevaluate the region and sample if any areas were concerning and or resect more tissue as necessary.  I would also look for lymph nodes during that ultrasound endoscopy.  However with the bronchial carcinoid being a possibility I am not sure if that would change our oncologist would think about her.  As such, I would like to discuss her case at the multidisciplinary conference next week.  I answered all the questions for the patient that she had in told her that she may reach out to [place] if any other questions arise in the interim.  I will relay this information to her initial referring provider Dr. [name] as well as to Dr. Marcello Moores who will be seeing her in the next week.  I have already added her to the [facility] list for next week and hope to be able to discuss her case at that point.  I also went through with the patient how to sign up for my chart that she will have access to her results.  [name], MD [facility] # [phone]

## 2018-08-24 ENCOUNTER — Encounter: Payer: Self-pay | Admitting: Gastroenterology

## 2018-08-24 NOTE — Progress Notes (Signed)
Review of outside records. These notes will be scanned into the chart.  [place] surgery clinic visit Assessment and plan this is a patient that was felt based on current guidelines to not require further evidence of need for low anterior resection.  However it was unclear to Dr. Marcello Moores as to whether the lung findings needed further work-up or follow-up that would be dictated based on multidisciplinary conference. We will have these note scanned into the chart.  [name], MD [facility] # [phone]

## 2018-08-24 NOTE — Telephone Encounter (Signed)
Unable to present patient at today's multidisciplinary conference. We will plan to add onto the next [place].

## 2018-08-31 ENCOUNTER — Other Ambulatory Visit: Payer: Self-pay

## 2018-08-31 ENCOUNTER — Telehealth: Payer: Self-pay | Admitting: Gastroenterology

## 2018-08-31 DIAGNOSIS — D3A8 Other benign neuroendocrine tumors: Secondary | ICD-10-CM

## 2018-08-31 DIAGNOSIS — D3A026 Benign carcinoid tumor of the rectum: Secondary | ICD-10-CM

## 2018-08-31 NOTE — Telephone Encounter (Signed)
The patient has been notified of this information and all questions answered.  EUS scheduled, pt instructed and medications reviewed.  Patient instructions mailed to home.  Patient to call with any questions or concerns. Lab order also in Epic the pt will come in prior to her vacation Referral also made to Thoracic surgeon

## 2018-08-31 NOTE — Telephone Encounter (Signed)
Her case was reviewed at [facility] today.  After reviewing the dotatate scan and having discussed her case with colorectal surgery it is felt from a rectal carcinoid perspective that we should plan an endoscopic surveillance.  For now we will consider a 35-month follow-up EUS to reevaluate the region and resect any recurrent or persistent tissue in the region.  We would look for lymph nodes in the pelvic region as well.  Subsequently we can decide a dotatate scan but the consensus from [facility] was felt to be a six-month dotatate scan to be performed. Discussing the patient's pulmonary nodule which was concerning on the dotatate scan for possible bronchogenic neuroendocrine tumor it was felt that further discussion with our thoracic surgery colleagues would be reasonable.   Drs. Gerhardt & VanTrigt were felt to be individuals who would be worthwhile to discuss the case further and place a referral for.   This is to discuss what the next best steps in regards to consideration of surveillance versus diagnostic tissue acquisition versus resection.   I will send an email to them in attempt of giving them a background as to what the concern is for this patient. I subsequently proceeded with calling the patient today to update her about our discussions at [facility].  I answered all of her questions. We will proceed with scheduling a follow-up lower EUS in 3 months time from her prior resection and proceed with placement of a referral to our thoracic surgery colleagues. I will send a chromogranin as well and since she is not on a PPI we will see if this is elevated or not. The patient is leaving town for a few weeks and I think a nonurgent thoracic surgery referral is reasonable.   The patient is appreciative for all the care that she had received. I will update her primary care provider as well as her colonoscopy hist Dr. [name] we did her initial colonoscopy.  [name], MD [facility]  [facility] Office # [phone]

## 2018-09-02 ENCOUNTER — Institutional Professional Consult (permissible substitution): Payer: BC Managed Care – PPO | Admitting: Cardiothoracic Surgery

## 2018-09-02 ENCOUNTER — Other Ambulatory Visit: Payer: Self-pay

## 2018-09-02 ENCOUNTER — Encounter: Payer: Self-pay | Admitting: Cardiothoracic Surgery

## 2018-09-02 ENCOUNTER — Other Ambulatory Visit: Payer: BC Managed Care – PPO

## 2018-09-02 VITALS — BP 138/88 | HR 76 | Resp 18 | Ht 65.0 in | Wt 195.8 lb

## 2018-09-02 DIAGNOSIS — R911 Solitary pulmonary nodule: Secondary | ICD-10-CM

## 2018-09-02 DIAGNOSIS — D3A026 Benign carcinoid tumor of the rectum: Secondary | ICD-10-CM

## 2018-09-02 NOTE — Progress Notes (Signed)
[address]       [place] [address]             [phone]                    Shannon [name] [place] Medical Record #[id number] Date of Birth: 23-Jun-1961  Referring: Shannon [name].* Primary Care: Rochel Brome, MD Primary Cardiologist: No primary care provider on file.  Chief Complaint:    Chief Complaint  Patient presents with  . 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 female is seen in the office  today for abnormal PET/ Ga 68 DOTATATE scan.  The patient had undergone her first routine screening colonoscopy and was found to have a  submucosal mass was found in the posterior rectum approximately 3 to 4 cm from the anal verge. The mass was circular and well-circumscribed with a small central umbilication. IIt diameter measured 12-15 mm.  EUS with submucosal resection was performed July 18, 2018. 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 - LYMPHOVASCULAR INVASION IS IDENTIFIED. - TUMOR IS FOCALLY PRESENT AT THE CAUTERIZED TISSUE EDGE Ki-67 is approximately 1% in the tumor cells   A PET tachycardic scan was performed January 22 with a small focus of uptake in the right hilum.  Patient was referred to thoracic surgery for further recommendations.  Patient is a former smoker up to a pack a day for 20 years but quit has been smoke-free for the last 12 years.  She has a history of asthma as a child and currently uses inhalers as needed     Current Activity/ Functional Status:  Patient is independent with mobility/ambulation, transfers, ADL's, IADL's.   Zubrod Score: At the time of surgery this patient's most appropriate activity status/level should be described as: '[x]'     0    Normal activity, no symptoms '[]'     1    Restricted in physical  strenuous activity but ambulatory, able to do out light work '[]'     2    Ambulatory and capable of self care, unable to do work activities, up and about               >50 % of waking hours                              '[]'     3    Only limited self care, in bed greater than 50% of waking hours '[]'     4    Completely disabled, no self care, confined to bed or chair '[]'     5    Moribund   Past Medical History:  Diagnosis Date  . Anxiety   . Asthma   . Dyspnea    with exertion  . Emphysema of lung ([facility])   . HLD (hyperlipidemia)   . Pneumonia   . Rectal tumor     Past Surgical History:  Procedure Laterality Date  . ABLATION     endometrial  . COLONOSCOPY    . DOPPLER ECHOCARDIOGRAPHY    . EUS N/A 07/18/2018   Procedure: LOWER ENDOSCOPIC ULTRASOUND (EUS);  Surgeon: Shannon [name]., MD;  Location: [facility];  Service: Gastroenterology;  Laterality: N/A;  . FLEXIBLE SIGMOIDOSCOPY N/A 07/18/2018  Procedure: FLEXIBLE SIGMOIDOSCOPY;  Surgeon: Shannon [name]., MD;  Location: [facility];  Service: Gastroenterology;  Laterality: N/A;  . TEE WITHOUT CARDIOVERSION  02/2018  . TONSILLECTOMY    . TUBAL LIGATION  1987    Family History  Problem Relation Age of Onset  . Heart disease Mother   . COPD Mother   . Emphysema Mother   . Diabetes Father   . Colon cancer Neg Hx   . Colon polyps Neg Hx   . Esophageal cancer Neg Hx   . Rectal cancer Neg Hx   . Stomach cancer Neg Hx    Father committed suicide at age 55 Mother died at age 76 of COPD complications One sister has thyroid disease Patient has 2 children who are healthy  Social History   Tobacco Use  Smoking Status Former Research scientist (life sciences)  . Years: 33.00  Smokeless Tobacco Never Used  Tobacco Comment   quit 12 years ago    Social History   Substance and Sexual Activity  Alcohol Use Yes   Comment: occ- rare      Allergies  Allergen Reactions  . Levaquin [Levofloxacin In D5w]     Nausea vomiting   . Other       Dog Dander     Current Outpatient Medications  Medication Sig Dispense Refill  . albuterol (PROVENTIL HFA;VENTOLIN HFA) 108 (90 Base) MCG/ACT inhaler Inhale 2 puffs into the lungs every 6 (six) hours as needed for wheezing or shortness of breath.    Marland Kitchen atorvastatin (LIPITOR) 20 MG tablet Take 20 mg by mouth every evening.     . busPIRone (BUSPAR) 10 MG tablet Take 10 mg by mouth 2 (two) times daily.     . calcium carbonate (TUMS - DOSED IN MG ELEMENTAL CALCIUM) 500 MG chewable tablet Chew 1 tablet by mouth daily as needed for indigestion or heartburn.    Marland Kitchen ibuprofen (ADVIL,MOTRIN) 200 MG tablet Take 400-600 mg by mouth daily as needed for headache or moderate pain.    Marland Kitchen LORazepam (ATIVAN) 0.5 MG tablet Take 0.5 mg by mouth every evening.      No current facility-administered medications for this visit.     Pertinent items are noted in HPI.   Review of Systems:     Cardiac Review of Systems: [Y] = yes  or   [ N ] = no   Chest Pain [ n   ]  Resting SOB [ n  ] Exertional SOB  [ n ]  Orthopnea [n  ]   Pedal Edema [  n ]    Palpitations [ n ] Syncope  [ n ]   Presyncope [ n  ]   General Review of Systems: [Y] = yes [  ]=no Constitional: recent weight change [  ];  Wt loss over the last 3 months [   ] anorexia [  ]; fatigue [  ]; nausea [  ]; night sweats [  ]; fever [  ]; or chills [  ];           Eye : blurred vision [  ]; diplopia [   ]; vision changes [  ];  Amaurosis fugax[  ]; Resp: cough [  ];  wheezing[  ];  hemoptysis[  ]; shortness of breath[  ]; paroxysmal nocturnal dyspnea[  ]; dyspnea on exertion[  ]; or orthopnea[  ];  GI:  gallstones[  ], vomiting[  ];  dysphagia[  ]; melena[  ];  hematochezia [  ];  heartburn[  ];   Hx of  Colonoscopy[  ]; GU: kidney stones [  ]; hematuria[  ];   dysuria [  ];  nocturia[  ];  history of     obstruction [  ]; urinary frequency [  ]             Skin: rash, swelling[  ];, hair loss[  ];  peripheral edema[  ];  or itching[  ]; Musculosketetal:  myalgias[  ];  joint swelling[  ];  joint erythema[  ];  joint pain[  ];  back pain[  ];  Heme/Lymph: bruising[  ];  bleeding[  ];  anemia[  ];  Neuro: TIA[  ];  headaches[  ];  stroke[  ];  vertigo[  ];  seizures[  ];   paresthesias[  ];  difficulty walking[  ];  Psych:depression[  ]; anxiety[  ];  Endocrine: diabetes[  ];  thyroid dysfunction[  ];  Immunizations: Flu up to date Blue.Reese  ]; Pneumococcal up to date Florencio.Farrier  ];  Other:     PHYSICAL EXAMINATION: BP 138/88 (BP Location: Left Arm, Patient Position: Sitting, Cuff Size: Large) Comment: manual  Pulse 76   Resp 18   Ht '5\' 5"'  (1.651 m)   Wt 195 lb 12.8 oz (88.8 kg)   SpO2 96% Comment: RA  BMI 32.58 kg/m  General appearance: alert, cooperative and appears stated age Head: Normocephalic, without obvious abnormality, atraumatic Neck: no adenopathy, no carotid bruit, no JVD, supple, symmetrical, trachea midline and thyroid not enlarged, symmetric, no tenderness/mass/nodules Lymph nodes: Cervical, supraclavicular, and axillary nodes normal. Resp: clear to auscultation bilaterally Back: symmetric, no curvature. ROM normal. No CVA tenderness. 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 Subsequent endoscopic mucosal resection. EXAM: NUCLEAR MEDICINE PET SKULL BASE TO THIGH TECHNIQUE: 5.1 mCi Ga 45 DOTATATE was injected intravenously. Full-ring PET imaging was performed from the skull base to thigh after the radiotracer. CT data was obtained and used for attenuation correction and anatomic localization. COMPARISON:  None. FINDINGS:  NECK No radiotracer activity in neck lymph nodes. Incidental CT findings: None CHEST Within the RIGHT hilum, a discrete focus of radiotracer accumulation with SUV max equal 11.7. On close inspection there is a small a parenchymal nodule measuring 6 mm (image 35/8) is immediately adjacent to the RIGHT lower lobe bronchus which corresponds the activity. No additional abnormal radiotracer in the thorax. No additional suspicious pulmonary nodules. Incidental CT finding:None ABDOMEN/PELVIS There is no activity within the rectum to suggest residual neuroendocrine tumor. Surgical clip noted distal rectum. There is no abnormal radiotracer accumulation within pelvic lymph nodes. No abdominal lymphadenopathy. No abnormal radiotracer accumulation within the liver. Physiologic activity noted in the adrenal glands, spleen and kidneys. Physiologic activity noted in the liver, spleen, adrenal glands and kidneys. Incidental CT findings:Lobular uterus consistent leiomyoma. SKELETON No focal activity to suggest skeletal metastasis. Incidental CT findings:None IMPRESSION: 1. No evidence residual neuroendocrine tumor within the rectum 2. No metastatic neuroendocrine tumor adenopathy in the abdomen or pelvis. 3. A single focus of intense uptake about the RIGHT hilum which localizes to a small  pulmonary nodule. 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 Recommend follow-up CT in 3 to 6 months to demonstrate stability. I suspect this lesion would be difficult to resect. Electronically Signed   By: Suzy Bouchard M.D.   On: 08/17/2018 17:01     I have independently reviewed the above radiology studies  and reviewed the findings with the patient.   Recent Lab Findings: Lab Results  Component Value Date   WBC 6.7 07/04/2018   HGB 15.4 (H) 07/04/2018   HCT 46.6 (H) 07/04/2018   PLT 249.0 07/04/2018   GLUCOSE 100 (H) 07/04/2018   NA 141 07/04/2018   K 4.5 07/04/2018   CL 105 07/04/2018   CREATININE 0.84 07/04/2018    BUN 21 07/04/2018   CO2 29 07/04/2018   INR 1.0 07/04/2018      Assessment / Plan:   Presents with known rectal carcinoid, resected submucosal, without evidence on PET scan of pelvic spread, and a single small area of uptake in the right hilum with questionable association with a 6 mm nodule.  Reviewing the current scans it would be difficult to surgically resect this or even find it definitively.  I reviewed the findings with the patient and have recommended that we obtain a follow-up CT scan to monitor stability or change in the area.    Follow up CT 3 months   I  spent 60 minutes with  the patient face to face.    Grace Isaac MD      [address] [place] [address] Office [phone]   Beeper [phone]  09/02/2018 3:19 PM

## 2018-09-05 LAB — TIQ-NTM

## 2018-09-05 LAB — CHROMOGRANIN A

## 2018-09-06 ENCOUNTER — Other Ambulatory Visit: Payer: BC Managed Care – PPO

## 2018-09-08 ENCOUNTER — Other Ambulatory Visit: Payer: Self-pay | Admitting: *Deleted

## 2018-09-08 ENCOUNTER — Telehealth: Payer: Self-pay | Admitting: *Deleted

## 2018-09-08 NOTE — Telephone Encounter (Signed)
Oncology Nurse Navigator Documentation  Oncology Nurse Navigator Flowsheets 09/08/2018  Navigator Location [facility]  Referral date to RadOnc/MedOnc 09/08/2018  Navigator Encounter Type Telephone/I called patient to schedule her an appt with Dr. Servando Snare.  I was unable to reach but did leave a vm message with my name and phone number.   Telephone Outgoing Call  Barriers/Navigation Needs Coordination of Care  Interventions Coordination of Care  Coordination of Care Other  Acuity Level 2  Time Spent with Patient 15

## 2018-09-08 NOTE — Telephone Encounter (Signed)
Oncology Nurse Navigator Documentation  Oncology Nurse Navigator Flowsheets 09/08/2018  Navigator Location [facility]  Navigator Encounter Type Telephone/I received a vm message from patient and I called her back. I updated her on appt for mtoc next week.  Patient states she will be out of the [place] next week and will return on 09/19/2018.  I will update Dr. Everrett Coombe office so they can get her schedule with him the week after.  Patient verbalized understanding.   Telephone Outgoing Call  Barriers/Navigation Needs Coordination of Care  Interventions Coordination of Care  Coordination of Care Other  Acuity Level 2  Time Spent with Patient 30

## 2018-09-08 NOTE — Progress Notes (Signed)
The proposed treatment discussed in cancer conference is for discussion purpose only and is not a binding recommendation. The patient was not physically examined nor present for their treatment options. Therefore, final treatment plans cannot be decided.  ?

## 2018-09-10 LAB — CHROMOGRANIN A: Chromogranin A: 68 ng/mL (ref 25–140)

## 2018-09-21 ENCOUNTER — Other Ambulatory Visit: Payer: Self-pay | Admitting: *Deleted

## 2018-09-21 ENCOUNTER — Other Ambulatory Visit: Payer: Self-pay

## 2018-09-21 ENCOUNTER — Encounter: Payer: Self-pay | Admitting: Cardiothoracic Surgery

## 2018-09-21 ENCOUNTER — Ambulatory Visit: Payer: BC Managed Care – PPO | Admitting: Cardiothoracic Surgery

## 2018-09-21 VITALS — BP 124/70 | HR 66 | Resp 16 | Ht 65.0 in | Wt 195.0 lb

## 2018-09-21 DIAGNOSIS — R911 Solitary pulmonary nodule: Secondary | ICD-10-CM | POA: Diagnosis not present

## 2018-09-21 DIAGNOSIS — C7A026 Malignant carcinoid tumor of the rectum: Secondary | ICD-10-CM | POA: Diagnosis not present

## 2018-09-21 DIAGNOSIS — R918 Other nonspecific abnormal finding of lung field: Secondary | ICD-10-CM

## 2018-09-21 NOTE — Progress Notes (Signed)
[address]       [place] [address]             [phone]                    [name] Medical Record #[id number] Date of Birth: April 15, 1961  Referring: Irving Copas.* Primary Care: Rochel Brome, MD Primary Cardiologist: No primary care provider on file.  Chief Complaint:    Chief Complaint  Patient presents with  . Lung Lesion    f/u with PET 08/17/18    History of Present Illness:    Shannon [name] 58 y.o. female is seen in the office   for abnormal PET/ Ga 68 DOTATATE scan.  The patient had undergone her first routine screening colonoscopy and was found to have a  submucosal mass was found in the posterior rectum approximately 3 to 4 cm from the anal verge. The mass was circular and well-circumscribed with a small central umbilication. IIt diameter measured 12-15 mm.  EUS with submucosal resection was performed July 18, 2018. 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 - LYMPHOVASCULAR INVASION IS IDENTIFIED. - TUMOR IS FOCALLY PRESENT AT THE CAUTERIZED TISSUE EDGE Ki-67 is approximately 1% in the tumor cells   A PET tachycardic scan was performed January 22 with a small focus of uptake in the right hilum.  Patient was referred to thoracic surgery for further recommendations.  Patient is a former smoker up to a pack a day for 20 years but quit has been smoke-free for the last 12 years.  She has a history of asthma as a child and currently uses inhalers as needed  Patient returns office today to discuss the timing of follow-up CT scan and also consideration of bronchoscopy for further evaluation.  Her last CT scan was done in August 2019, under a different name in the PACS system.    Current Activity/ Functional Status:  Patient is independent with mobility/ambulation, transfers, ADL's, IADL's.   Zubrod Score: At the time of  surgery this patient's most appropriate activity status/level should be described as: '[x]'     0    Normal activity, no symptoms '[]'     1    Restricted in physical strenuous activity but ambulatory, able to do out light work '[]'     2    Ambulatory and capable of self care, unable to do work activities, up and about               >50 % of waking hours                              '[]'     3    Only limited self care, in bed greater than 50% of waking hours '[]'     4    Completely disabled, no self care, confined to bed or chair '[]'     5    Moribund   Past Medical History:  Diagnosis Date  . Anxiety   . Asthma   . Dyspnea    with exertion  . Emphysema of lung ([facility])   . HLD (hyperlipidemia)   . Pneumonia   . Rectal tumor     Past Surgical History:  Procedure Laterality Date  . ABLATION     endometrial  . COLONOSCOPY    . DOPPLER ECHOCARDIOGRAPHY    .  EUS N/A 07/18/2018   Procedure: LOWER ENDOSCOPIC ULTRASOUND (EUS);  Surgeon: Irving Copas., MD;  Location: [facility];  Service: Gastroenterology;  Laterality: N/A;  . FLEXIBLE SIGMOIDOSCOPY N/A 07/18/2018   Procedure: FLEXIBLE SIGMOIDOSCOPY;  Surgeon: Irving Copas., MD;  Location: [facility];  Service: Gastroenterology;  Laterality: N/A;  . TEE WITHOUT CARDIOVERSION  02/2018  . TONSILLECTOMY    . TUBAL LIGATION  1987    Family History  Problem Relation Age of Onset  . Heart disease Mother   . COPD Mother   . Emphysema Mother   . Diabetes Father   . Colon cancer Neg Hx   . Colon polyps Neg Hx   . Esophageal cancer Neg Hx   . Rectal cancer Neg Hx   . Stomach cancer Neg Hx    Father committed suicide at age 37 Mother died at age 69 of COPD complications One sister has thyroid disease Patient has 2 children who are healthy  Social History   Tobacco Use  Smoking Status Former Research scientist (life sciences)  . Years: 33.00  Smokeless Tobacco Never Used  Tobacco Comment   quit 12 years ago    Social History   Substance and  Sexual Activity  Alcohol Use Yes   Comment: occ- rare      Allergies  Allergen Reactions  . Levaquin [Levofloxacin In D5w]     Nausea vomiting   . Other     Dog Dander     Current Outpatient Medications  Medication Sig Dispense Refill  . albuterol (PROVENTIL HFA;VENTOLIN HFA) 108 (90 Base) MCG/ACT inhaler Inhale 2 puffs into the lungs every 6 (six) hours as needed for wheezing or shortness of breath.    Marland Kitchen atorvastatin (LIPITOR) 20 MG tablet Take 20 mg by mouth every evening.     . busPIRone (BUSPAR) 10 MG tablet Take 10 mg by mouth 2 (two) times daily.     . calcium carbonate (TUMS - DOSED IN MG ELEMENTAL CALCIUM) 500 MG chewable tablet Chew 1 tablet by mouth daily as needed for indigestion or heartburn.    Marland Kitchen ibuprofen (ADVIL,MOTRIN) 200 MG tablet Take 400-600 mg by mouth daily as needed for headache or moderate pain.    Marland Kitchen LORazepam (ATIVAN) 0.5 MG tablet Take 0.5 mg by mouth every evening.      No current facility-administered medications for this visit.     Pertinent items are noted in HPI.   Review of Systems:     Cardiac Review of Systems: [Y] = yes  or   [ N ] = no   Chest Pain [ n   ]  Resting SOB [ n  ] Exertional SOB  [ n ]  Orthopnea [n  ]   Pedal Edema [  n ]    Palpitations [ n ] Syncope  [ n ]   Presyncope [ n  ]   General Review of Systems: [Y] = yes [  ]=no Constitional: recent weight change [  ];  Wt loss over the last 3 months [   ] anorexia [  ]; fatigue [  ]; nausea [  ]; night sweats [  ]; fever [  ]; or chills [  ];           Eye : blurred vision [  ]; diplopia [   ]; vision changes [  ];  Amaurosis fugax[  ]; Resp: cough [  ];  wheezing[  ];  hemoptysis[  ]; shortness of breath[  ];  paroxysmal nocturnal dyspnea[  ]; dyspnea on exertion[  ]; or orthopnea[  ];  GI:  gallstones[  ], vomiting[  ];  dysphagia[  ]; melena[  ];  hematochezia [  ]; heartburn[  ];   Hx of  Colonoscopy[  ]; GU: kidney stones [  ]; hematuria[  ];   dysuria [  ];  nocturia[  ];   history of     obstruction [  ]; urinary frequency [  ]             Skin: rash, swelling[  ];, hair loss[  ];  peripheral edema[  ];  or itching[  ]; Musculosketetal: myalgias[  ];  joint swelling[  ];  joint erythema[  ];  joint pain[  ];  back pain[  ];  Heme/Lymph: bruising[  ];  bleeding[  ];  anemia[  ];  Neuro: TIA[  ];  headaches[  ];  stroke[  ];  vertigo[  ];  seizures[  ];   paresthesias[  ];  difficulty walking[  ];  Psych:depression[  ]; anxiety[  ];  Endocrine: diabetes[  ];  thyroid dysfunction[  ];  Immunizations: Flu up to date Blue.Reese  ]; Pneumococcal up to date Florencio.Farrier  ];  Other:     PHYSICAL EXAMINATION: BP 124/70 (BP Location: Left Arm, Patient Position: Sitting, Cuff Size: Large)   Pulse 66   Resp 16   Ht '5\' 5"'  (1.651 m)   Wt 195 lb (88.5 kg)   SpO2 96% Comment: RA  BMI 32.45 kg/m  General appearance: alert, cooperative and appears stated age Head: Normocephalic, without obvious abnormality, atraumatic Neck: no adenopathy, no carotid bruit, no JVD, supple, symmetrical, trachea midline and thyroid not enlarged, symmetric, no tenderness/mass/nodules Lymph nodes: Cervical, supraclavicular, and axillary nodes normal. Resp: clear to auscultation bilaterally Back: symmetric, no curvature. ROM normal. No CVA tenderness. 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 Subsequent endoscopic mucosal resection. EXAM: NUCLEAR MEDICINE PET SKULL BASE TO THIGH TECHNIQUE: 5.1 mCi Ga 69 DOTATATE was injected intravenously. Full-ring PET imaging was performed from the skull base to  thigh after the radiotracer. CT data was obtained and used for attenuation correction and anatomic localization. COMPARISON:  None. FINDINGS: NECK No radiotracer activity in neck lymph nodes. Incidental CT findings: None CHEST Within the RIGHT hilum, a discrete focus of radiotracer accumulation with SUV max equal 11.7. On close inspection there is a small a parenchymal nodule measuring 6 mm (image 35/8) is immediately adjacent to the RIGHT lower lobe bronchus which corresponds the activity. No additional abnormal radiotracer in the thorax. No additional suspicious pulmonary nodules. Incidental CT finding:None ABDOMEN/PELVIS There is no activity within the rectum to suggest residual neuroendocrine tumor. Surgical clip noted distal rectum. There is no abnormal radiotracer accumulation within pelvic lymph nodes. No abdominal lymphadenopathy. No abnormal radiotracer accumulation within the liver. Physiologic activity noted in the adrenal glands, spleen and kidneys. Physiologic activity noted in the liver, spleen, adrenal glands and kidneys. Incidental CT findings:Lobular uterus consistent leiomyoma. SKELETON No focal activity to suggest skeletal metastasis. Incidental CT findings:None IMPRESSION: 1. No evidence residual  neuroendocrine tumor within the rectum 2. No metastatic neuroendocrine tumor adenopathy in the abdomen or pelvis. 3. A single focus of intense uptake about the RIGHT hilum which localizes to a small pulmonary nodule. 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 Recommend follow-up CT in 3 to 6 months to demonstrate stability. I suspect this lesion would be difficult to resect. Electronically Signed   By: Suzy Bouchard M.D.   On: 08/17/2018 17:01     I have independently reviewed the above radiology studies  and reviewed the findings with the patient.   Recent Lab Findings: Lab Results  Component Value Date   WBC 6.7 07/04/2018   HGB 15.4 (H) 07/04/2018   HCT 46.6 (H) 07/04/2018   PLT  249.0 07/04/2018   GLUCOSE 100 (H) 07/04/2018   NA 141 07/04/2018   K 4.5 07/04/2018   CL 105 07/04/2018   CREATININE 0.84 07/04/2018   BUN 21 07/04/2018   CO2 29 07/04/2018   INR 1.0 07/04/2018      Assessment / Plan:   Presents with known rectal carcinoid, resected submucosal, without evidence on PET scan of pelvic spread, and a single small area of uptake in the right hilum with questionable association with a 6 mm nodule.    I discussed with the patient on further review of her PET scan with the area that lights up may be intrabronchial, since her last detailed CT scan was in August 2019, we will obtain a follow-up noncontrasted CT of the chest, and consider bronchoscopy.     Grace Isaac MD      [address] [place] [address] Office [phone]   Beeper [phone]  09/21/2018 8:57 PM

## 2018-10-04 ENCOUNTER — Ambulatory Visit
Admission: RE | Admit: 2018-10-04 | Discharge: 2018-10-04 | Disposition: A | Discharge status: Home / Self Care | Payer: BC Managed Care – PPO | Source: Ambulatory Visit | Attending: Cardiothoracic Surgery | Admitting: Cardiothoracic Surgery

## 2018-10-04 DIAGNOSIS — R918 Other nonspecific abnormal finding of lung field: Secondary | ICD-10-CM

## 2018-10-06 ENCOUNTER — Other Ambulatory Visit: Payer: Self-pay

## 2018-10-06 ENCOUNTER — Ambulatory Visit (INDEPENDENT_AMBULATORY_CARE_PROVIDER_SITE_OTHER): Payer: BC Managed Care – PPO | Admitting: Cardiothoracic Surgery

## 2018-10-06 ENCOUNTER — Other Ambulatory Visit: Payer: Self-pay | Admitting: *Deleted

## 2018-10-06 ENCOUNTER — Encounter (HOSPITAL_COMMUNITY): Payer: Self-pay | Admitting: *Deleted

## 2018-10-06 VITALS — BP 102/80 | HR 70 | Resp 18 | Ht 65.0 in | Wt 196.0 lb

## 2018-10-06 DIAGNOSIS — R911 Solitary pulmonary nodule: Secondary | ICD-10-CM | POA: Diagnosis not present

## 2018-10-06 NOTE — Progress Notes (Signed)
[address]       Shannon [name], [id number]             [phone]                    Shannon [name] Medical Record #[id number] Date of Birth: [birth year]  Referring: Irving Copas.* Primary Care: Rochel Brome, MD Primary Cardiologist: No primary care provider on file.  Chief Complaint:    Chief Complaint  Patient presents with  . Lung Lesion    f/u after super d CT    History of Present Illness:    Shannon [name] 58 y.o. female is seen in the office   for abnormal PET/ Ga 68 DOTATATE scan and now returns with a follow-up CT of the chest to compare to the one she had in August 2019.  the patient had undergone her first routine screening colonoscopy and was found to have a  submucosal mass was found in the posterior rectum approximately 3 to 4 cm from the anal verge. The mass was circular and well-circumscribed with a small central umbilication. IIt diameter measured 12-15 mm.  EUS with submucosal resection was performed July 18, 2018. 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 - LYMPHOVASCULAR INVASION IS IDENTIFIED. - TUMOR IS FOCALLY PRESENT AT THE CAUTERIZED TISSUE EDGE Ki-67 is approximately 1% in the tumor cells   A PEPET/ Ga 68 DOTATATETscan was performed January 22 with a small focus of uptake in the right hilum.  Patient was referred to thoracic surgery for further recommendations.  Patient is a former smoker up to a pack a day for 20 years but quit has been smoke-free for the last 12 years.  She has a history of asthma as a child and currently uses inhalers as needed    Her last CT scan was done in August 2019, under a different name in the PACS system.    Current Activity/ Functional Status:  Patient is independent with mobility/ambulation, transfers, ADL's, IADL's.   Zubrod Score: At the time of surgery this patient's most appropriate  activity status/level should be described as: '[x]'     0    Normal activity, no symptoms '[]'     1    Restricted in physical strenuous activity but ambulatory, able to do out light work '[]'     2    Ambulatory and capable of self care, unable to do work activities, up and about               >50 % of waking hours                              '[]'     3    Only limited self care, in bed greater than 50% of waking hours '[]'     4    Completely disabled, no self care, confined to bed or chair '[]'     5    Moribund   Past Medical History:  Diagnosis Date  . Anxiety   . Asthma   . Dyspnea    with exertion  . Emphysema of lung ([place])   . HLD (hyperlipidemia)   . Pneumonia   . Rectal tumor     Past Surgical History:  Procedure Laterality Date  . ABLATION     endometrial  . COLONOSCOPY    . DOPPLER  ECHOCARDIOGRAPHY    . EUS N/A 07/18/2018   Procedure: LOWER ENDOSCOPIC ULTRASOUND (EUS);  Surgeon: Irving Copas., MD;  Location: [facility];  Service: Gastroenterology;  Laterality: N/A;  . FLEXIBLE SIGMOIDOSCOPY N/A 07/18/2018   Procedure: FLEXIBLE SIGMOIDOSCOPY;  Surgeon: Irving Copas., MD;  Location: [facility];  Service: Gastroenterology;  Laterality: N/A;  . TEE WITHOUT CARDIOVERSION  02/2018  . TONSILLECTOMY    . TUBAL LIGATION  1987    Family History  Problem Relation Age of Onset  . Heart disease Mother   . COPD Mother   . Emphysema Mother   . Diabetes Father   . Colon cancer Neg Hx   . Colon polyps Neg Hx   . Esophageal cancer Neg Hx   . Rectal cancer Neg Hx   . Stomach cancer Neg Hx    Father committed suicide at age 61 Mother died at age 65 of COPD complications One sister has thyroid disease Patient has 2 children who are healthy  Social History   Tobacco Use  Smoking Status Former Research scientist (life sciences)  . Years: 33.00  Smokeless Tobacco Never Used  Tobacco Comment   quit 12 years ago    Social History   Substance and Sexual Activity  Alcohol Use Yes    Comment: occ- rare      Allergies  Allergen Reactions  . Levaquin [Levofloxacin In D5w] Nausea And Vomiting       . Other     Dog Dander     Current Outpatient Medications  Medication Sig Dispense Refill  . albuterol (PROVENTIL HFA;VENTOLIN HFA) 108 (90 Base) MCG/ACT inhaler Inhale 2 puffs into the lungs every 6 (six) hours as needed for wheezing or shortness of breath.    Marland Kitchen atorvastatin (LIPITOR) 20 MG tablet Take 20 mg by mouth every evening.     . busPIRone (BUSPAR) 10 MG tablet Take 5 mg by mouth 2 (two) times daily.     . fluticasone (FLONASE) 50 MCG/ACT nasal spray Place 1 spray into both nostrils daily as needed for allergies or rhinitis.    Marland Kitchen ibuprofen (ADVIL,MOTRIN) 200 MG tablet Take 400-600 mg by mouth daily as needed for headache or moderate pain.    Marland Kitchen LORazepam (ATIVAN) 0.5 MG tablet Take 0.5 mg by mouth every evening.      No current facility-administered medications for this visit.     Pertinent items are noted in HPI.   Review of Systems:     Cardiac Review of Systems: [Y] = yes  or   [ N ] = no   Chest Pain Aqua.Slicker  ]  Resting SOB [ N  ] Exertional SOB  Aqua.Slicker ]  Vertell Limber Aqua.Slicker  ]   Pedal Edema [  N ]    Palpitations [N] Syncope  Aqua.Slicker ]   Presyncope [ N ]   General Review of Systems: [Y] = yes [  ]=no Constitional: recent weight change [  ];  Wt loss over the last 3 months [   ] anorexia [  ]; fatigue [  ]; nausea [  ]; night sweats [  ]; fever [  ]; or chills [  ];           Eye : blurred vision [  ]; diplopia [   ]; vision changes [  ];  Amaurosis fugax[  ]; Resp: cough [  ];  wheezing[  ];  hemoptysis[  ]; shortness of breath[  ]; paroxysmal nocturnal dyspnea[  ]; dyspnea on  exertion[  ]; or orthopnea[  ];  GI:  gallstones[  ], vomiting[  ];  dysphagia[  ]; melena[  ];  hematochezia [  ]; heartburn[  ];   Hx of  Colonoscopy[ Y ]; GU: kidney stones [  ]; hematuria[  ];   dysuria [  ];  nocturia[  ];  history of     obstruction [  ]; urinary frequency [  ]             Skin:  rash, swelling[  ];, hair loss[  ];  peripheral edema[  ];  or itching[  ]; Musculosketetal: myalgias[  ];  joint swelling[  ];  joint erythema[  ];  joint pain[  ];  back pain[  ];  Heme/Lymph: bruising[  ];  bleeding[  ];  anemia[  ];  Neuro: TIA[  ];  headaches[  ];  stroke[  ];  vertigo[  ];  seizures[  ];   paresthesias[  ];  difficulty walking[  ];  Psych:depression[  ]; anxiety[  ];  Endocrine: diabetes[  ];  thyroid dysfunction[  ];  Immunizations: Flu up to date Blue.Reese  ]; Pneumococcal up to date Florencio.Farrier  ];  Other:     PHYSICAL EXAMINATION: BP 102/80 (BP Location: Left Arm, Patient Position: Sitting, Cuff Size: Large)   Pulse 70   Resp 18   Ht '5\' 5"'  (1.651 m)   Wt 196 lb (88.9 kg)   SpO2 97% Comment: RA  BMI 32.62 kg/m  General appearance: alert, cooperative and no distress Head: Normocephalic, without obvious abnormality, atraumatic Neck: no adenopathy, no carotid bruit, no JVD, supple, symmetrical, trachea midline and thyroid not enlarged, symmetric, no tenderness/mass/nodules Lymph nodes: Cervical, supraclavicular, and axillary nodes normal. Resp: clear to auscultation bilaterally Back: symmetric, no curvature. ROM normal. No CVA tenderness. Cardio: regular rate and rhythm, S1, S2 normal, no murmur, click, rub or gallop GI: soft, non-tender; bowel sounds normal; no masses,  no organomegaly Extremities: extremities normal, atraumatic, no cyanosis or edema and Homans sign is negative, no sign of DVT Neurologic: Grossly normal  Diagnostic Studies & Laboratory data:     Recent Radiology Findings:  Ct Super D Chest Wo Contrast  Result Date: 10/04/2018 CLINICAL DATA:  Pulmonary nodule avid for neuroendocrine tumor PET imaging agent. History of rectal carcinoma. EXAM: CT CHEST WITHOUT CONTRAST TECHNIQUE: Multidetector CT imaging of the chest was performed using thin slice collimation for electromagnetic bronchoscopy planning purposes, without intravenous contrast. COMPARISON:  No  tape PET scan 08/17/2018 FINDINGS: Cardiovascular: No significant vascular findings. Normal heart size. No pericardial effusion. Mediastinum/Nodes: No axillary supraclavicular adenopathy. No mediastinal hilar adenopathy. No pericardial effusion. Esophagus normal. Lungs/Pleura: The small nodule identified on comparison DOTATATE PET scan localizes to a small endobronchial lesion at the bifurcation of the RIGHT middle lobe bronchi. This small ovoid lesion measures 8 mm (image 76/8 axial series; image 100/6/sagittal series, and image 80/4/coronal series) No additional pulmonary nodules. Upper Abdomen: Limited view of the liver, kidneys, pancreas are unremarkable. Normal adrenal glands. Musculoskeletal: No aggressive osseous lesion. IMPRESSION: 1. Small endobronchial lesion at the takeoff of the RIGHT middle lobe bronchus corresponds to the lesion of concern on comparison DOTATATE PET scan. 2. No lymphadenopathy. Electronically Signed   By: Suzy Bouchard M.D.   On: 10/04/2018 16:26    Nm Pet (netspot Ga 68 Dotatate) Skull Base To Mid Thigh  Result Date: 08/17/2018 CLINICAL DATA:  Carcinoma rectum identified on colonoscopy. Subsequent endoscopic mucosal resection. EXAM: NUCLEAR MEDICINE  PET SKULL BASE TO THIGH TECHNIQUE: 5.1 mCi Ga 68 DOTATATE was injected intravenously. Full-ring PET imaging was performed from the skull base to thigh after the radiotracer. CT data was obtained and used for attenuation correction and anatomic localization. COMPARISON:  None. FINDINGS: NECK No radiotracer activity in neck lymph nodes. Incidental CT findings: None CHEST Within the RIGHT hilum, a discrete focus of radiotracer accumulation with SUV max equal 11.7. On close inspection there is a small a parenchymal nodule measuring 6 mm (image 35/8) is immediately adjacent to the RIGHT lower lobe bronchus which corresponds the activity. No additional abnormal radiotracer in the thorax. No additional suspicious pulmonary nodules.  Incidental CT finding:None ABDOMEN/PELVIS There is no activity within the rectum to suggest residual neuroendocrine tumor. Surgical clip noted distal rectum. There is no abnormal radiotracer accumulation within pelvic lymph nodes. No abdominal lymphadenopathy. No abnormal radiotracer accumulation within the liver. Physiologic activity noted in the adrenal glands, spleen and kidneys. Physiologic activity noted in the liver, spleen, adrenal glands and kidneys. Incidental CT findings:Lobular uterus consistent leiomyoma. SKELETON No focal activity to suggest skeletal metastasis. Incidental CT findings:None IMPRESSION: 1. No evidence residual neuroendocrine tumor within the rectum 2. No metastatic neuroendocrine tumor adenopathy in the abdomen or pelvis. 3. A single focus of intense uptake about the RIGHT hilum which localizes to a small pulmonary nodule. 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 Recommend follow-up CT in 3 to 6 months to demonstrate stability. I suspect this lesion would be difficult to resect. Electronically Signed   By: Suzy Bouchard M.D.   On: 08/17/2018 17:01     I have independently reviewed the above radiology studies  and reviewed the findings with the patient.   Recent Lab Findings: Lab Results  Component Value Date   WBC 6.7 07/04/2018   HGB 15.4 (H) 07/04/2018   HCT 46.6 (H) 07/04/2018   PLT 249.0 07/04/2018   GLUCOSE 100 (H) 07/04/2018   NA 141 07/04/2018   K 4.5 07/04/2018   CL 105 07/04/2018   CREATININE 0.84 07/04/2018   BUN 21 07/04/2018   CO2 29 07/04/2018   INR 1.0 07/04/2018      Assessment / Plan:    #1 patient with known rectal carcinoid, submucosal resection without evidence on PET scan of pelvic spread.  There was a single area of uptake in the right hilum questionably associated with a 6 mm lung nodule.  Follow-up CT of the chest suggest that this nodule is endobronchial.  I discussed with the patient proceeding with bronchoscopy and  endobronchial evaluation and possible endobronchial resection.  Risks and options are discussed with the patient in detail and she is agreeable with proceeding.   Grace Isaac MD      [address] [place] [address] [place]   Beeper [phone]  10/06/2018 4:02 PM

## 2018-10-07 ENCOUNTER — Telehealth: Payer: Self-pay | Admitting: Gastroenterology

## 2018-10-07 NOTE — Telephone Encounter (Signed)
Pt has a procedure on Monday, October 10, 2018 and stated that an admitting nurse from [facility] called and gave her different prep instructions.  Pt would like clarification.

## 2018-10-07 NOTE — Telephone Encounter (Signed)
The pt was instructed and all questions answered.

## 2018-10-09 ENCOUNTER — Encounter (HOSPITAL_COMMUNITY): Payer: Self-pay

## 2018-10-09 ENCOUNTER — Emergency Department (HOSPITAL_COMMUNITY): Payer: BC Managed Care – PPO

## 2018-10-09 ENCOUNTER — Other Ambulatory Visit: Payer: Self-pay

## 2018-10-09 ENCOUNTER — Emergency Department (HOSPITAL_COMMUNITY)
Admission: EM | Admit: 2018-10-09 | Discharge: 2018-10-09 | Disposition: A | Discharge status: Home / Self Care | Payer: BC Managed Care – PPO | Attending: Emergency Medicine | Admitting: Emergency Medicine

## 2018-10-09 DIAGNOSIS — M4722 Other spondylosis with radiculopathy, cervical region: Secondary | ICD-10-CM | POA: Diagnosis not present

## 2018-10-09 DIAGNOSIS — M62838 Other muscle spasm: Secondary | ICD-10-CM

## 2018-10-09 DIAGNOSIS — M25511 Pain in right shoulder: Secondary | ICD-10-CM | POA: Diagnosis present

## 2018-10-09 DIAGNOSIS — Z85118 Personal history of other malignant neoplasm of bronchus and lung: Secondary | ICD-10-CM | POA: Insufficient documentation

## 2018-10-09 DIAGNOSIS — J45909 Unspecified asthma, uncomplicated: Secondary | ICD-10-CM | POA: Insufficient documentation

## 2018-10-09 DIAGNOSIS — Z87891 Personal history of nicotine dependence: Secondary | ICD-10-CM | POA: Diagnosis not present

## 2018-10-09 DIAGNOSIS — Z85048 Personal history of other malignant neoplasm of rectum, rectosigmoid junction, and anus: Secondary | ICD-10-CM | POA: Insufficient documentation

## 2018-10-09 DIAGNOSIS — Z79899 Other long term (current) drug therapy: Secondary | ICD-10-CM | POA: Diagnosis not present

## 2018-10-09 DIAGNOSIS — M19011 Primary osteoarthritis, right shoulder: Secondary | ICD-10-CM | POA: Diagnosis not present

## 2018-10-09 DIAGNOSIS — M19019 Primary osteoarthritis, unspecified shoulder: Secondary | ICD-10-CM

## 2018-10-09 MED ORDER — METHOCARBAMOL 500 MG PO TABS
500.0000 mg | ORAL_TABLET | Freq: Once | ORAL | Status: AC
  Administered 2018-10-09: 500 mg via ORAL
  Filled 2018-10-09: qty 1

## 2018-10-09 MED ORDER — HYDROCODONE-ACETAMINOPHEN 5-325 MG PO TABS: 1.0000 | ORAL_TABLET | Freq: Four times a day (QID) | ORAL | 0 refills | Status: DC | PRN

## 2018-10-09 MED ORDER — HYDROCODONE-ACETAMINOPHEN 5-325 MG PO TABS
1.0000 | ORAL_TABLET | Freq: Once | ORAL | Status: AC
  Administered 2018-10-09: 1 via ORAL
  Filled 2018-10-09: qty 1

## 2018-10-09 MED ORDER — METHOCARBAMOL 500 MG PO TABS: 500.0000 mg | ORAL_TABLET | Freq: Three times a day (TID) | ORAL | 0 refills | Status: DC | PRN

## 2018-10-09 NOTE — ED Notes (Signed)
Urine specimen has been collected, labeled and placed at bedside.

## 2018-10-09 NOTE — ED Provider Notes (Signed)
[facility] DEPT Provider Note   CSN: [id number] Arrival date & time: 10/09/18  1340    History   Chief Complaint Chief Complaint  Patient presents with   Right Arm/Shoulder Pain    HPI    Shannon [name] is a 58 y.o. 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  She denies any injury or trauma, denies any overuse or repetitive movement activities recently.  She did clean her house yesterday.  She describes the pain as 7/10 constant achy and throbbing right shoulder pain that radiates down her right arm, worse with movement, unrelieved with Tylenol and Flexeril, and somewhat improved with Advil, ice, and heat.  She has some pain in the trapezius area as well but denies any neck pain.  She is scheduled for bronchoscopy tomorrow and was told not to take NSAIDs, but she did not know what else to take before she tried Advil so she finally did take some Advil.  She has taken that today as well.  She denies neck pain, fevers, chills, CP, SOB, abd pain, N/V/D/C, hematuria, dysuria, numbness, tingling, focal weakness, or any other complaints at this time.   The history is provided by the patient and medical records. No language interpreter was used.    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   Abnormal colonoscopy 07/07/2018    Past Surgical History:  Procedure  Laterality Date   ABLATION     endometrial   COLONOSCOPY     DOPPLER ECHOCARDIOGRAPHY     EUS N/A 07/18/2018   Procedure: LOWER ENDOSCOPIC ULTRASOUND (EUS);  Surgeon: Irving Copas., MD;  Location: [facility];  Service: Gastroenterology;  Laterality: N/A;   FLEXIBLE SIGMOIDOSCOPY N/A 07/18/2018   Procedure: FLEXIBLE SIGMOIDOSCOPY;  Surgeon: Rush Landmark Telford Nab., MD;  Location: [facility];  Service: Gastroenterology;  Laterality: N/A;   TEE WITHOUT CARDIOVERSION  02/2018   TONSILLECTOMY     TUBAL LIGATION  1987     OB History   No obstetric history on file.      Home Medications    Prior to Admission medications   Medication Sig Start Date End Date Taking? Authorizing Provider  albuterol (PROVENTIL HFA;VENTOLIN HFA) 108 (90 Base) MCG/ACT inhaler Inhale 2 puffs into the lungs every 6 (six) hours as needed for wheezing or shortness of breath.    [provider]  atorvastatin (LIPITOR) 20 MG tablet Take 20 mg by mouth every evening.     [provider]  busPIRone (BUSPAR) 10 MG tablet Take 5 mg by mouth 2 (two) times daily.  02/07/18   [provider]  fluticasone (FLONASE) 50 MCG/ACT nasal spray Place 1 spray into both nostrils daily as needed for allergies or rhinitis.    [provider]  ibuprofen (  ADVIL,MOTRIN) 200 MG tablet Take 400-600 mg by mouth daily as needed for headache or moderate pain.    [provider]  LORazepam (ATIVAN) 0.5 MG tablet Take 0.5 mg by mouth every evening.     [provider]    Family History Family History  Problem Relation Age of Onset   Heart disease Mother    COPD Mother    Emphysema Mother    Diabetes Father    Colon cancer Neg Hx    Colon polyps Neg Hx    Esophageal cancer Neg Hx    Rectal cancer Neg Hx    Stomach cancer Neg Hx     Social History Social History   Tobacco Use   Smoking status: Former Smoker    Years: 33.00    Last attempt to quit:  2008    Years since quitting: 12.2   Smokeless tobacco: Never Used  Substance Use Topics   Alcohol use: Not Currently    Comment: occ- rare    Drug use: Not Currently     Allergies   Levaquin [levofloxacin in d5w] and Other   Review of Systems Review of Systems  Constitutional: Negative for chills and fever.  Respiratory: Negative for shortness of breath.   Cardiovascular: Negative for chest pain.  Gastrointestinal: Negative for abdominal pain, constipation, diarrhea, nausea and vomiting.  Genitourinary: Negative for dysuria and hematuria.  Musculoskeletal: Positive for arthralgias, myalgias and neck pain.  Skin: Negative for color change.  Allergic/Immunologic: Negative for immunocompromised state.  Neurological: Negative for weakness and numbness.  Psychiatric/Behavioral: Negative for confusion.   All other systems reviewed and are negative for acute change except as noted in the HPI.    Physical Exam Updated Vital Signs BP 133/63 (BP Location: Left Arm)    Pulse 79    Temp 98.3 F (36.8 C) (Oral)    Resp 18    Ht 5\' 5"  (1.651 m)    Wt 85.7 kg    SpO2 99%    BMI 31.45 kg/m   Physical Exam Vitals signs and nursing note reviewed.  Constitutional:      General: She is not in acute distress.    Appearance: Normal appearance. She is well-developed. She is not toxic-appearing.     Comments: Afebrile, nontoxic, NAD  HENT:     Head: Normocephalic and atraumatic.  Eyes:     General:        Right eye: No discharge.        Left eye: No discharge.     Conjunctiva/sclera: Conjunctivae normal.  Neck:     Musculoskeletal: Normal range of motion and neck supple. Normal range of motion. No neck rigidity, spinous process tenderness or muscular tenderness.     Comments: FROM intact without spinous process TTP, no bony stepoffs or deformities, no paraspinous muscle TTP or muscle spasms in the paracervical muscles. No rigidity or meningeal signs. No bruising or swelling.    Cardiovascular:     Rate and Rhythm: Normal rate and regular rhythm.     Pulses: Normal pulses.     Heart sounds: Normal heart sounds, S1 normal and S2 normal. No murmur. No friction rub. No gallop.   Pulmonary:     Effort: Pulmonary effort is normal. No respiratory distress.     Breath sounds: Normal breath sounds. No decreased breath sounds, wheezing, rhonchi or rales.  Abdominal:     General: Bowel sounds are normal. There is no distension.     Palpations: Abdomen is  soft. Abdomen is not rigid.     Tenderness: There is no abdominal tenderness. There is no right CVA tenderness, left CVA tenderness, guarding or rebound. Negative signs include Murphy's sign and McBurney's sign.  Musculoskeletal:     Right shoulder: She exhibits decreased range of motion (due to pain), tenderness, bony tenderness and spasm. She exhibits no swelling, no effusion, no crepitus and no deformity.       Arms:     Comments: R shoulder with limited ROM due to pain, with mild lateral joint line TTP, with mild trapezius muscular TTP and spasms, mild tenderness somewhat diffusely down the arm as well, FROM intact at elbow and wrist, no swelling/effusion of joints, no bruising or erythema, no warmth, no crepitus/deformity, +apley scratch, +pain with resisted int/ext rotation. Strength and sensation grossly intact in all extremities, distal pulses intact.    Skin:    General: Skin is warm and dry.     Findings: No rash.  Neurological:     Mental Status: She is alert and oriented to person, place, and time.     Sensory: Sensation is intact. No sensory deficit.     Motor: Motor function is intact.  Psychiatric:        Mood and Affect: Mood and affect normal.        Behavior: Behavior normal.      ED Treatments / Results  Labs (all labs ordered are listed, but only abnormal results are displayed) Labs Reviewed - No data to display  EKG None  Radiology Dg Shoulder Right  Result Date: 10/09/2018 CLINICAL  DATA:  Right shoulder pain. No known injury. EXAM: RIGHT SHOULDER - 2+ VIEW COMPARISON:  None. FINDINGS: Calcified loose bodies. Mild superior spur formation at the acromioclavicular joint. Minimal inferior glenohumeral spur formation. IMPRESSION: 1. Mild degenerative changes. 2. Calcified loose bodies. Electronically Signed   By: Claudie Revering M.D.   On: 10/09/2018 15:33   Ct Cervical Spine Wo Contrast  Result Date: 10/09/2018 CLINICAL DATA:  Right shoulder pain for 1 week. No known injury. EXAM: CT CERVICAL SPINE WITHOUT CONTRAST TECHNIQUE: Multidetector CT imaging of the cervical spine was performed without intravenous contrast. Multiplanar CT image reconstructions were also generated. COMPARISON:  None. FINDINGS: Alignment: Reversal of the normal cervical lordosis. Mild dextroconvex cervicothoracic scoliosis. No subluxations. Skull base and vertebrae: No acute fracture. No primary bone lesion or focal pathologic process. Soft tissues and spinal canal: No prevertebral fluid or swelling. No visible canal hematoma. Disc levels:  C2-3: Unremarkable. C3-4: Unremarkable. C4-5: Unremarkable. C5-6: Mild to moderate anterior and posterior spur formation with mild to moderate disc space narrowing. No superimposed disc protrusions. Mild bilateral foraminal stenosis and mild canal stenosis. C6-7: Mild to moderate anterior spur formation and mild posterior spur formation causing moderate bilateral foraminal stenosis and mild canal stenosis. C7-T1: Unremarkable. T1-2: Minimal anterior spur formation. T2-3: Mild to moderate anterior disc protrusion and mild anterior spur formation with anterior longitudinal ligament ossification. No significant posterior spur formation. Upper chest: Clear lung apices. Other: None. IMPRESSION: 1. Degenerative changes, as described above. 2. Reversal of the normal cervical lordosis and mild dextroconvex cervicothoracic scoliosis. Electronically Signed   By: Claudie Revering M.D.   On: 10/09/2018  15:31    Procedures Procedures (including critical care time)  Medications Ordered in ED Medications  methocarbamol (ROBAXIN) tablet 500 mg (500 mg Oral Given 10/09/18 1456)  HYDROcodone-acetaminophen (NORCO/VICODIN) 5-325 MG per tablet 1 tablet (1 tablet Oral Given 10/09/18 1456)     Initial  Impression / Assessment and Plan / ED Course  I have reviewed the triage vital signs and the nursing notes.  Pertinent labs & imaging results that were available during my care of the patient were reviewed by me and considered in my medical decision making (see chart for details).        58 y.o. female with recently diagnosed with rectal carcinoma s/p resection with a small endobronchial lesion found on CT imaging, here with right shoulder and arm pain that started about a week ago but went away until yesterday when it got worse.  On exam, no cervical spine tenderness, mild tenderness of the right trapezius with some spasm, mild tenderness of the lateral right shoulder joint and some mild tenderness diffusely down the arm, limited ROM of the shoulder due to pain, but F ROM intact at the elbow and wrist.  No crepitus or deformity.  Pain with resisted internal and external rotation, +apley scratch test. DDx includes cervical radiculopathy vs adhesive capsulitis vs bursitis, etc. Will proceed with xray of R shoulder and CT cervical spine, will give norco and robaxin, and reassess shortly. Doubt need for labs at this time. Discussed case with my attending Dr. Melina Copa who agrees with plan.   4:03 PM R shoulder xray showing mild degenerative changes with calcified loose bodies. CT cervical spine with mild to moderate spur formation at multiple levels with mild to moderate disc space narrowing and some bilateral foraminal stenosis and mild canal stenosis at multiple levels. Suspect these are the sources of her pain, likely degenerative and radiculopathy in nature. Pt feeling better. Will send home with robaxin  and norco, want to avoid NSAIDs for now given that she's having a procedure soon, advised that once she's cleared for NSAID use she can use that as well; also want to avoid steroids since this may interfere with her procedure/testing of the lesion. Advised use of ice/heat. F/up with PCP in 1wk for recheck. I explained the diagnosis and have given explicit precautions to return to the ER including for any other new or worsening symptoms. The patient understands and accepts the medical plan as it's been dictated and I have answered their questions. Discharge instructions concerning home care and prescriptions have been given. The patient is STABLE and is discharged to home in good condition.    Final Clinical Impressions(s) / ED Diagnoses   Final diagnoses:  Acute pain of right shoulder  Trapezius muscle spasm  Osteoarthritis of spine with radiculopathy, cervical region  Shoulder arthritis    ED Discharge Orders         Ordered    HYDROcodone-acetaminophen (NORCO) 5-325 MG tablet  Every 6 hours PRN     10/09/18 1601    methocarbamol (ROBAXIN) 500 MG tablet  Every 8 hours PRN     10/09/18 [address], [place], [place] 10/09/18 1607    Hayden Rasmussen, MD 10/10/18 [phone]

## 2018-10-09 NOTE — Discharge Instructions (Addendum)
Use ice or heat on your shoulder throughout the day, using an ice/heat pack for no more than 20 minutes at a time every hour. Use norco as directed as needed for pain. Use robaxin as directed as needed for muscle spasms. Do not drive or operate machinery with pain medication or muscle relaxant use. You can use ibuprofen/advil AFTER your procedure and you're cleared by the surgeons to use those types of medicines. Follow up with your regular doctor in 1 week for recheck of symptoms and ongoing management of your shoulder pain. Return to the ER for changes or worsening symptoms.

## 2018-10-09 NOTE — ED Triage Notes (Signed)
Patient arrived via POV with Husband at bedside. Patient is AOx4 and ambulatory. Patient chief complaint is Right shoulder and right arm pain that began about 7x days ago. Pain was very minor and pain began to dissipate however pain came back with a vengeance per patient yesterday.

## 2018-10-09 NOTE — ED Notes (Signed)
Patient transported to CT 

## 2018-10-10 ENCOUNTER — Ambulatory Visit (HOSPITAL_COMMUNITY)
Admission: RE | Admit: 2018-10-10 | Discharge: 2018-10-10 | Disposition: A | Discharge status: Home / Self Care | Payer: BC Managed Care – PPO | Attending: Gastroenterology | Admitting: Gastroenterology

## 2018-10-10 ENCOUNTER — Ambulatory Visit (HOSPITAL_COMMUNITY): Payer: BC Managed Care – PPO | Admitting: Certified Registered Nurse Anesthetist

## 2018-10-10 ENCOUNTER — Encounter (HOSPITAL_COMMUNITY)
Admission: RE | Disposition: A | Discharge status: Home / Self Care | Payer: Self-pay | Source: Home / Self Care | Attending: Gastroenterology

## 2018-10-10 ENCOUNTER — Encounter (HOSPITAL_COMMUNITY): Payer: Self-pay | Admitting: Gastroenterology

## 2018-10-10 DIAGNOSIS — J439 Emphysema, unspecified: Secondary | ICD-10-CM | POA: Diagnosis not present

## 2018-10-10 DIAGNOSIS — K5730 Diverticulosis of large intestine without perforation or abscess without bleeding: Secondary | ICD-10-CM | POA: Insufficient documentation

## 2018-10-10 DIAGNOSIS — K642 Third degree hemorrhoids: Secondary | ICD-10-CM | POA: Diagnosis not present

## 2018-10-10 DIAGNOSIS — R933 Abnormal findings on diagnostic imaging of other parts of digestive tract: Secondary | ICD-10-CM | POA: Diagnosis not present

## 2018-10-10 DIAGNOSIS — Z87891 Personal history of nicotine dependence: Secondary | ICD-10-CM | POA: Insufficient documentation

## 2018-10-10 DIAGNOSIS — Z08 Encounter for follow-up examination after completed treatment for malignant neoplasm: Secondary | ICD-10-CM | POA: Insufficient documentation

## 2018-10-10 DIAGNOSIS — K644 Residual hemorrhoidal skin tags: Secondary | ICD-10-CM | POA: Diagnosis not present

## 2018-10-10 DIAGNOSIS — D3A026 Benign carcinoid tumor of the rectum: Secondary | ICD-10-CM

## 2018-10-10 DIAGNOSIS — J45909 Unspecified asthma, uncomplicated: Secondary | ICD-10-CM | POA: Diagnosis not present

## 2018-10-10 DIAGNOSIS — Z85048 Personal history of other malignant neoplasm of rectum, rectosigmoid junction, and anus: Secondary | ICD-10-CM | POA: Insufficient documentation

## 2018-10-10 HISTORY — PX: SUBMUCOSAL LIFTING INJECTION: SHX6855

## 2018-10-10 HISTORY — DX: Personal history of urinary calculi: Z87.442

## 2018-10-10 HISTORY — PX: EUS: SHX5427

## 2018-10-10 HISTORY — DX: Malignant (primary) neoplasm, unspecified: C80.1

## 2018-10-10 HISTORY — PX: BIOPSY: SHX5522

## 2018-10-10 HISTORY — PX: ENDOSCOPIC MUCOSAL RESECTION: SHX6839

## 2018-10-10 HISTORY — PX: FLEXIBLE SIGMOIDOSCOPY: SHX5431

## 2018-10-10 HISTORY — PX: HEMOSTASIS CLIP PLACEMENT: SHX6857

## 2018-10-10 SURGERY — ULTRASOUND, LOWER GI TRACT, ENDOSCOPIC
Anesthesia: Monitor Anesthesia Care

## 2018-10-10 MED ORDER — METOCLOPRAMIDE HCL 5 MG/ML IJ SOLN: 10.0000 mg | Freq: Once | INTRAMUSCULAR | Status: DC | PRN

## 2018-10-10 MED ORDER — SODIUM CHLORIDE 0.9 % IV SOLN: INTRAVENOUS | Status: DC

## 2018-10-10 MED ORDER — PROPOFOL 500 MG/50ML IV EMUL
INTRAVENOUS | Status: DC | PRN
  Administered 2018-10-10 (×2): 75 ug/kg/min via INTRAVENOUS

## 2018-10-10 MED ORDER — ONDANSETRON HCL 4 MG/2ML IJ SOLN: 4.0000 mg | Freq: Once | INTRAMUSCULAR | Status: DC | PRN

## 2018-10-10 MED ORDER — LIDOCAINE HCL (CARDIAC) PF 100 MG/5ML IV SOSY
PREFILLED_SYRINGE | INTRAVENOUS | Status: DC | PRN
  Administered 2018-10-10: 100 mg via INTRAVENOUS

## 2018-10-10 MED ORDER — PROPOFOL 10 MG/ML IV BOLUS
INTRAVENOUS | Status: DC | PRN
  Administered 2018-10-10 (×2): 20 mg via INTRAVENOUS
  Administered 2018-10-10: 30 mg via INTRAVENOUS

## 2018-10-10 MED ORDER — OXYCODONE HCL 5 MG/5ML PO SOLN: 5.0000 mg | Freq: Once | ORAL | Status: DC | PRN

## 2018-10-10 MED ORDER — LACTATED RINGERS IV SOLN
INTRAVENOUS | Status: DC
  Administered 2018-10-10: 10:00:00 via INTRAVENOUS

## 2018-10-10 MED ORDER — OXYCODONE HCL 5 MG PO TABS: 5.0000 mg | ORAL_TABLET | Freq: Once | ORAL | Status: DC | PRN

## 2018-10-10 MED ORDER — FENTANYL CITRATE (PF) 100 MCG/2ML IJ SOLN: 25.0000 ug | INTRAMUSCULAR | Status: DC | PRN

## 2018-10-10 NOTE — Transfer of Care (Signed)
Immediate Anesthesia Transfer of Care Note  Patient: Shannon [name]  Procedure(s) Performed: LOWER ENDOSCOPIC ULTRASOUND (EUS) (N/A ) ENDOSCOPIC MUCOSAL RESECTION (N/A ) SCHLEROTHERAPY BIOPSY POLYPECTOMY  Patient Location: PACU  Anesthesia Type:MAC  Level of Consciousness: awake and alert   Airway & Oxygen Therapy: Patient Spontanous Breathing and Patient connected to nasal cannula oxygen  Post-op Assessment: Report given to RN, Post -op Vital signs reviewed and stable and Patient moving all extremities  Post vital signs: Reviewed and stable  Last Vitals:  Vitals Value Taken Time  BP 126/73 10/10/2018 11:34 AM  Temp    Pulse 77 10/10/2018 11:37 AM  Resp 25 10/10/2018 11:37 AM  SpO2 100 % 10/10/2018 11:37 AM  Vitals shown include unvalidated device data.  Last Pain:  Vitals:   10/10/18 0926  TempSrc: Oral  PainSc: 3          Complications: No apparent anesthesia complications

## 2018-10-10 NOTE — Anesthesia Postprocedure Evaluation (Signed)
Anesthesia Post Note  Patient: Shannon [name]  Procedure(s) Performed: LOWER ENDOSCOPIC ULTRASOUND (EUS) (N/A ) ENDOSCOPIC MUCOSAL RESECTION (N/A ) SCHLEROTHERAPY BIOPSY POLYPECTOMY     Patient location during evaluation: PACU Anesthesia Type: MAC Level of consciousness: awake and alert and oriented Pain management: pain level controlled Vital Signs Assessment: post-procedure vital signs reviewed and stable Respiratory status: spontaneous breathing, nonlabored ventilation and respiratory function stable Cardiovascular status: stable and blood pressure returned to baseline Postop Assessment: no apparent nausea or vomiting Anesthetic complications: no    Last Vitals:  Vitals:   10/10/18 1204 10/10/18 1207  BP: (!) 143/79 (!) 141/77  Pulse: 78 81  Resp: 14 19  Temp: (!) 36.4 C   SpO2: 99% 98%    Last Pain:  Vitals:   10/10/18 1207  TempSrc:   PainSc: 0-No pain                 Ayub Kirsh A.

## 2018-10-10 NOTE — Discharge Instructions (Signed)
YOU HAD AN ENDOSCOPIC PROCEDURE TODAY: Refer to the procedure report and other information in the discharge instructions given to you for any specific questions about what was found during the examination. If this information does not answer your questions, please call [facility] office at [phone] to clarify.  ° °YOU SHOULD EXPECT: Some feelings of bloating in the abdomen. Passage of more gas than usual. Walking can help get rid of the air that was put into your GI tract during the procedure and reduce the bloating. If you had a lower endoscopy (such as a colonoscopy or flexible sigmoidoscopy) you may notice spotting of blood in your stool or on the toilet paper. Some abdominal soreness may be present for a day or two, also. ° °DIET: Your first meal following the procedure should be a light meal and then it is ok to progress to your normal diet. A half-sandwich or bowl of soup is an example of a good first meal. Heavy or fried foods are harder to digest and may make you feel nauseous or bloated. Drink plenty of fluids but you should avoid alcoholic beverages for 24 hours. If you had a esophageal dilation, please see attached instructions for diet.   ° °ACTIVITY: Your care partner should take you home directly after the procedure. You should plan to take it easy, moving slowly for the rest of the day. You can resume normal activity the day after the procedure however YOU SHOULD NOT DRIVE, use power tools, machinery or perform tasks that involve climbing or major physical exertion for 24 hours (because of the sedation medicines used during the test).  ° °SYMPTOMS TO REPORT IMMEDIATELY: °A gastroenterologist can be reached at any hour. Please call [phone]  for any of the following symptoms:  °Following lower endoscopy (colonoscopy, flexible sigmoidoscopy) °Excessive amounts of blood in the stool  °Significant tenderness, worsening of abdominal pains  °Swelling of the abdomen that is new, acute  °Fever of 100° or  higher  °Following upper endoscopy (EGD, EUS, ERCP, esophageal dilation) °Vomiting of blood or coffee ground material  °New, significant abdominal pain  °New, significant chest pain or pain under the shoulder blades  °Painful or persistently difficult swallowing  °New shortness of breath  °Black, tarry-looking or red, bloody stools ° °FOLLOW UP:  °If any biopsies were taken you will be contacted by phone or by letter within the next 1-3 weeks. Call [phone]  if you have not heard about the biopsies in 3 weeks.  °Please also call with any specific questions about appointments or follow up tests. ° °

## 2018-10-10 NOTE — Anesthesia Preprocedure Evaluation (Signed)
Anesthesia Evaluation  Patient identified by MRN, date of birth, ID band Patient awake    Reviewed: Allergy & Precautions, NPO status , Patient's Chart, lab work & pertinent test results  Airway Mallampati: II  TM Distance: >3 FB Neck ROM: Full    Dental no notable dental hx. 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 carcinoid- positive PET scan scheduled for Bronchoscopy in 2 days   Pulmonary exam normal breath sounds clear to auscultation       Cardiovascular negative cardio ROS   Rhythm:Regular Rate:Normal     Neuro/Psych Anxiety negative neurological ROS  negative psych ROS   GI/Hepatic Rectal carcinoid   Endo/Other  Hyperlipidemia  Renal/GU Renal diseaseHx/o renal calculi  negative genitourinary   Musculoskeletal Right shoulder pain this past weekend, given Norco and Robaxin   Abdominal (+) + obese,   Peds  Hematology negative hematology ROS (+)   Anesthesia Other Findings   Reproductive/Obstetrics                             Anesthesia Physical Anesthesia Plan  ASA: II  Anesthesia Plan: MAC   Post-op Pain Management:    Induction:   PONV Risk Score and Plan: 2 and Propofol infusion, Ondansetron and Treatment may vary due to age or medical condition  Airway Management Planned: Natural Airway, Nasal Cannula and Simple Face Mask  Additional Equipment:   Intra-op Plan:   Post-operative Plan:   Informed Consent: I have reviewed the patients History and Physical, chart, labs and discussed the procedure including the risks, benefits and alternatives for the proposed anesthesia with the patient or authorized representative who has indicated his/her understanding and acceptance.     Dental advisory given  Plan Discussed with: CRNA and Surgeon  Anesthesia Plan  Comments:         Anesthesia Quick Evaluation

## 2018-10-10 NOTE — H&P (Signed)
GASTROENTEROLOGY OUTPATIENT PROCEDURE H&P NOTE   Primary Care Physician: Rochel Brome, MD  HPI: Shannon [name] is a 58 y.o. female who presents for Lower EUS with possible EMR.  Past Medical History:  Diagnosis Date  . Anxiety   . Asthma   . 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 Dyspnea    with exertion  . Emphysema of lung ([place])    patient denies  . History of kidney stones    passed  . HLD (hyperlipidemia)   . Pneumonia 02/2018  . Rectal tumor    Past Surgical History:  Procedure Laterality Date  . ABLATION     endometrial  . COLONOSCOPY    . DOPPLER ECHOCARDIOGRAPHY    . EUS N/A 07/18/2018   Procedure: LOWER ENDOSCOPIC ULTRASOUND (EUS);  Surgeon: Irving Copas., MD;  Location: [facility];  Service: Gastroenterology;  Laterality: N/A;  . FLEXIBLE SIGMOIDOSCOPY N/A 07/18/2018   Procedure: FLEXIBLE SIGMOIDOSCOPY;  Surgeon: Irving Copas., MD;  Location: [facility];  Service: Gastroenterology;  Laterality: N/A;  . TEE WITHOUT CARDIOVERSION  02/2018  . TONSILLECTOMY    . TUBAL LIGATION  1987   No current facility-administered medications for this encounter.    Allergies  Allergen Reactions  . Levaquin [Levofloxacin In D5w] Nausea And Vomiting       . Other     Dog Dander    Family History  Problem Relation Age of Onset  . Heart disease Mother   . COPD Mother   . Emphysema Mother   . Diabetes Father   . Colon cancer Neg Hx   . Colon polyps Neg Hx   . Esophageal cancer Neg Hx   . Rectal cancer Neg Hx   . Stomach cancer Neg Hx    Social History   Socioeconomic History  . Marital status: Married    Spouse name: Not on file  . Number of children: Not on file  . Years of education: Not on file  . Highest education level: Not on file  Occupational History  . Not on file  Social Needs  . Financial resource strain: Not on file  . Food insecurity:    Worry: Not on file    Inability: Not on file  . Transportation needs:    Medical:  Not on file    Non-medical: Not on file  Tobacco Use  . Smoking status: Former Smoker    Years: 33.00    Last attempt to quit: 2008    Years since quitting: 12.2  . Smokeless tobacco: Never Used  Substance and Sexual Activity  . Alcohol use: Not Currently    Comment: occ- rare   . Drug use: Not Currently  . Sexual activity: Not Currently    Birth control/protection: None  Lifestyle  . Physical activity:    Days per week: Not on file    Minutes per session: Not on file  . Stress: Not on file  Relationships  . Social connections:    Talks on phone: Not on file    Gets together: Not on file    Attends religious service: Not on file    Active member of club or organization: Not on file    Attends meetings of clubs or organizations: Not on file    Relationship status: Not on file  . Intimate partner violence:    Fear of current or ex partner: Not on file    Emotionally abused: Not on file  Physically abused: Not on file    Forced sexual activity: Not on file  Other Topics Concern  . Not on file  Social History Narrative  . Not on file    Physical Exam: Vital signs in last 24 hours:     GEN: NAD EYE: Sclerae anicteric ENT: MMM CV: RR without R/Gs  RESP: CTAB posteriorly GI: Soft, NT/ND NEURO:  Alert & Oriented x 3  Lab Results: No results for input(s): WBC, HGB, HCT, PLT in the last 72 hours. BMET No results for input(s): NA, K, CL, CO2, GLUCOSE, BUN, CREATININE, CALCIUM in the last 72 hours. LFT No results for input(s): PROT, ALBUMIN, AST, ALT, ALKPHOS, BILITOT, BILIDIR, IBILI in the last 72 hours. PT/INR No results for input(s): LABPROT, INR in the last 72 hours.   Impression / Plan: This is a 58 y.o.female who presents for Lower EUS with possible EMR.  The risks and benefits of endoscopic evaluation were discussed with the patient; these include but are not limited to the risk of perforation, infection, bleeding, missed lesions, lack of diagnosis, severe  illness requiring hospitalization, as well as anesthesia and sedation related illnesses.  The patient is agreeable to proceed.    [name], MD [facility] Office # [phone]

## 2018-10-10 NOTE — Op Note (Signed)
[facility] Patient Name: Shannon [name] Procedure Date : 10/10/2018 MRN: [id number] Attending MD: [name] , MD Date of Birth: [birth year] CSN: [id number] Age: 58 Admit Type: Outpatient Procedure:                Lower EUS Indications:              Post-op endosonographic surveillance for potential                            anorectal carcinoma recurrence, Rectal deformity                            found on endoscopy; subepithelial tumor versus                            extrinsic compression Providers:                [name], MD, Burtis Junes, RN, Elspeth Cho Tech., Technician, Raphael Gibney, CRNA Referring MD:             Lilia Argue. Vanetta Shawl [name], MD Medicines:                Monitored Anesthesia Care Complications:            No immediate complications. Estimated Blood Loss:     Estimated blood loss was minimal. Procedure:                Pre-Anesthesia Assessment:                           - Prior to the procedure, a History and Physical                            was performed, and patient medications and                            allergies were reviewed. The patient's tolerance of                            previous anesthesia was also reviewed. The risks                            and benefits of the procedure and the sedation                            options and risks were discussed with the patient.                            All questions were answered, and informed consent                            was obtained. Prior Anticoagulants: The patient has  taken no previous anticoagulant or antiplatelet                            agents. ASA Grade Assessment: II - A patient with                            mild systemic disease. After reviewing the risks                            and benefits, the patient was deemed in                            satisfactory condition to undergo  the procedure.                           After obtaining informed consent, the endoscope was                            passed under direct vision. Throughout the                            procedure, the patient's blood pressure, pulse, and                            oxygen saturations were monitored continuously. The                            [id number] ([id number]) Olympus therapeutic                            gastroscope was introduced through the anus and                            advanced to the the descending colon. The                            [id number] ([id number]) Olympus Radial EUS scope was                            introduced through the anus and advanced to the the                            sigmoid colon for ultrasound. The lower EUS was                            accomplished without difficulty. The patient                            tolerated the procedure. The quality of the bowel                            preparation was adequate. Scope In: 10:22:02 AM Scope Out: 11:25:15 AM Total Procedure Duration: 1 hour 3 minutes 13 seconds  Findings:  ENDOSCOPIC FINDING: :      Many small and large-mouthed diverticula were found in the sigmoid colon       and descending colon.      Patchy mucosal changes characterized by aphthous appearing erosions were       found in the rectum, in the recto-sigmoid colon and in the sigmoid       colon. Biopsies were taken with a cold forceps for histology to rule out       colitis chronicity - query preparation and enema artifact.      A 20 mm post mucosectomy scar was found in the mid rectum. There was       polypoid appearing tissue in the region that looked mostly to be       granulation, however a small region had a pit pattern change that was       different. After the EUS was completed as below, preparations were made       for mucosal resection. [place] orise gel was injected to raise the       lesion. The lesion did not lift well  as a result of previous mucosectomy       and scarring in the region. Piecemeal mucosal resection using a hot       snare was performed. The region was also biopsied extensively with       cold-forceps to gain further tissue on the edges. Resection and       retrieval were felt to be complete. There was some oozing noted in the       regions of the deeper forcep bites that oozed for more than 5 minutes.       Although likely would stop on its own, to prevent bleeding after this       intervention, four hemostatic clips were successfully placed (MR       conditional). There was no bleeding at the end of the procedure.      Non-bleeding non-thrombosed external and internal hemorrhoids were found       during perianal exam, during digital exam and during endoscopy. The       hemorrhoids were Grade III (internal hemorrhoids that prolapse but       require manual reduction).      ENDOSONOGRAPHIC FINDING: :      The rectosigmoid junction and sigmoid colon were normal.      Local wall thickening was found in the rectum. This was encountered from       11 to 13 cm (from the anal verge). This finding appeared to be primarily       due to increased thickness of the muscularis propria (Layer 4). This was       at the scar site. This had suggestion of healing muscularis from recent       mucosectomy, but with concern for LVI and the margin to the cautery,       even though the DOTATATE Scan was negative, in the abdomen/rectal       region, decision was made as above to proceed after EUS for resection to       see if any further tissue is consistent with NET.      No malignant-appearing lymph nodes were visualized in the perirectal       region and in the left iliac region.      The internal anal sphincter was visualized endosonographically and       appeared normal.  Impression:               Flexible Sigmoidoscopy Impression:                           - Diverticulosis in the sigmoid colon and in the                             descending colon.                           - Patchy mucosal changes of apthous erosions were                            found in the rectum, in the recto-sigmoid colon and                            in the sigmoid colon. Biopsied to rule out chronic                            colitis - likely preparation artifact.                           - Post mucosectomy scar in the mid rectum. After                            EUS, mucosal resection performed. Clips (MR                            conditional) were placed.                           - Non-bleeding non-thrombosed external and internal                            hemorrhoids.                           EUS Impression:                           - Endosonographic imaging showed no sign of                            significant pathology at the rectosigmoid junction                            and in the sigmoid colon.                           - Local wall thickening was visualized                            endosonographically in the rectum. This was due to                            increased  thickness of the muscularis propria                            (Layer 4) and this is at the scar site.                           - No malignant-appearing lymph nodes were                            visualized endosonographically in the perirectal                            region and in the left iliac region.                           - The internal anal sphincter was visualized                            endosonographically and appeared normal. Recommendation:           - The patient will be observed post-procedure,                            until all discharge criteria are met.                           - Discharge patient to home.                           - Patient has a contact number available for                            emergencies. The signs and symptoms of potential                            delayed  complications were discussed with the                            patient. Return to normal activities tomorrow.                            Written discharge instructions were provided to the                            patient.                           - High fiber diet.                           - Await path results.                           - Repeat lower endoscopic ultrasound in 6 months  for surveillance. If there is evidence of any NET                            on the specimen, then we will need to consider a                            FTRD vs Colorectal Surgery evaluation. Will                            consider timing of repeat DOTATATE Scan in future.                            May consider in future, even if negative for NET on                            today's sampling but will discuss with patient in                            future.                           - Patient to have Endobronchial resection of nodule                            in the future based on Cardiothoracic surgery                            availability.                           - The findings and recommendations were discussed                            with the patient.                           - The findings and recommendations were discussed                            with the patient's family. Procedure Code(s):        --- Professional ---                           [phone], Sigmoidoscopy, flexible; with endoscopic                            mucosal resection                           38466, Sigmoidoscopy, flexible; with endoscopic                            ultrasound examination                           45331, 1, Sigmoidoscopy, flexible; with  biopsy,                            single or multiple Diagnosis Code(s):        --- Professional ---                           K64.2, Third degree hemorrhoids                           K62.89, Other specified diseases of anus and  rectum                           K63.89, Other specified diseases of intestine                           Z98.890, Other specified postprocedural states                           I89.9, Noninfective disorder of lymphatic vessels                            and lymph nodes, unspecified                           Z08, Encounter for follow-up examination after                            completed treatment for malignant neoplasm                           Z85.048, Personal history of other malignant                            neoplasm of rectum, rectosigmoid junction, and anus                           K57.30, Diverticulosis of large intestine without                            perforation or abscess without bleeding CPT copyright 2018 [employer]. All rights reserved. The codes documented in this report are preliminary and upon coder review may  be revised to meet current compliance requirements. [name], MD 10/10/2018 11:57:00 AM Number of Addenda: 0

## 2018-10-11 ENCOUNTER — Other Ambulatory Visit: Payer: Self-pay

## 2018-10-11 ENCOUNTER — Encounter: Payer: Self-pay | Admitting: Gastroenterology

## 2018-10-11 ENCOUNTER — Encounter (HOSPITAL_COMMUNITY): Payer: Self-pay | Admitting: *Deleted

## 2018-10-11 NOTE — Progress Notes (Addendum)
Shannon [name] reports that she was seen in the ED 3/145/2020 with shoulder pain, she was started on a Prednisone dose pack.  Patient reports that pain has decreased. I notified Manuela Schwartz who will notify Dr Servando Snare.  Manuela Schwartz said this should not be a problem.

## 2018-10-12 ENCOUNTER — Ambulatory Visit (HOSPITAL_COMMUNITY)
Admission: RE | Admit: 2018-10-12 | Discharge: 2018-10-12 | Disposition: A | Discharge status: Home / Self Care | Payer: BC Managed Care – PPO | Attending: Cardiothoracic Surgery | Admitting: Cardiothoracic Surgery

## 2018-10-12 ENCOUNTER — Encounter (HOSPITAL_COMMUNITY)
Admission: RE | Disposition: A | Discharge status: Home / Self Care | Payer: Self-pay | Source: Home / Self Care | Attending: Cardiothoracic Surgery

## 2018-10-12 ENCOUNTER — Ambulatory Visit (HOSPITAL_COMMUNITY): Payer: BC Managed Care – PPO | Admitting: Certified Registered Nurse Anesthetist

## 2018-10-12 ENCOUNTER — Other Ambulatory Visit: Payer: Self-pay

## 2018-10-12 ENCOUNTER — Encounter (HOSPITAL_COMMUNITY): Payer: Self-pay

## 2018-10-12 ENCOUNTER — Ambulatory Visit (HOSPITAL_COMMUNITY): Payer: BC Managed Care – PPO

## 2018-10-12 DIAGNOSIS — F419 Anxiety disorder, unspecified: Secondary | ICD-10-CM | POA: Diagnosis not present

## 2018-10-12 DIAGNOSIS — Z85030 Personal history of malignant carcinoid tumor of large intestine: Secondary | ICD-10-CM | POA: Diagnosis not present

## 2018-10-12 DIAGNOSIS — Z87891 Personal history of nicotine dependence: Secondary | ICD-10-CM | POA: Insufficient documentation

## 2018-10-12 DIAGNOSIS — Z79899 Other long term (current) drug therapy: Secondary | ICD-10-CM | POA: Insufficient documentation

## 2018-10-12 DIAGNOSIS — Z79891 Long term (current) use of opiate analgesic: Secondary | ICD-10-CM | POA: Insufficient documentation

## 2018-10-12 DIAGNOSIS — J439 Emphysema, unspecified: Secondary | ICD-10-CM | POA: Insufficient documentation

## 2018-10-12 DIAGNOSIS — M1990 Unspecified osteoarthritis, unspecified site: Secondary | ICD-10-CM | POA: Insufficient documentation

## 2018-10-12 DIAGNOSIS — E785 Hyperlipidemia, unspecified: Secondary | ICD-10-CM | POA: Diagnosis not present

## 2018-10-12 DIAGNOSIS — Z881 Allergy status to other antibiotic agents status: Secondary | ICD-10-CM | POA: Insufficient documentation

## 2018-10-12 DIAGNOSIS — R911 Solitary pulmonary nodule: Secondary | ICD-10-CM

## 2018-10-12 DIAGNOSIS — Z885 Allergy status to narcotic agent status: Secondary | ICD-10-CM | POA: Diagnosis not present

## 2018-10-12 HISTORY — PX: VIDEO BRONCHOSCOPY: SHX5072

## 2018-10-12 HISTORY — DX: Unspecified osteoarthritis, unspecified site: M19.90

## 2018-10-12 LAB — COMPREHENSIVE METABOLIC PANEL
ALT: 20 U/L (ref 0–44)
AST: 19 U/L (ref 15–41)
Albumin: 3.9 g/dL (ref 3.5–5.0)
Alkaline Phosphatase: 65 U/L (ref 38–126)
Anion gap: 9 (ref 5–15)
BUN: 13 mg/dL (ref 6–20)
CO2: 25 mmol/L (ref 22–32)
Calcium: 10 mg/dL (ref 8.9–10.3)
Chloride: 108 mmol/L (ref 98–111)
Creatinine, Ser: 0.78 mg/dL (ref 0.44–1.00)
GFR calc Af Amer: >60 mL/min (ref >60)
GFR calc non Af Amer: >60 mL/min (ref >60)
Glucose, Bld: 90 mg/dL (ref 70–99)
Potassium: 3.8 mmol/L (ref 3.5–5.1)
Sodium: 142 mmol/L (ref 135–145)
Total Bilirubin: 0.7 mg/dL (ref 0.3–1.2)
Total Protein: 7.1 g/dL (ref 6.5–8.1)

## 2018-10-12 LAB — CBC
HCT: 47.3 % — ABNORMAL HIGH (ref 36.0–46.0)
Hemoglobin: 14.8 g/dL (ref 12.0–15.0)
MCH: 28.2 pg (ref 26.0–34.0)
MCHC: 31.3 g/dL (ref 30.0–36.0)
MCV: 90.3 fL (ref 80.0–100.0)
Platelets: 254 10*3/uL (ref 150–400)
RBC: 5.24 MIL/uL — ABNORMAL HIGH (ref 3.87–5.11)
RDW: 13.2 % (ref 11.5–15.5)
WBC: 14.9 10*3/uL — ABNORMAL HIGH (ref 4.0–10.5)
nRBC: 0 % (ref 0.0–0.2)

## 2018-10-12 LAB — PROTIME-INR
INR: 1 (ref 0.8–1.2)
Prothrombin Time: 12.6 seconds (ref 11.4–15.2)

## 2018-10-12 LAB — APTT: aPTT: 26 seconds (ref 24–36)

## 2018-10-12 SURGERY — BRONCHOSCOPY, VIDEO-ASSISTED
Anesthesia: General

## 2018-10-12 MED ORDER — GLYCOPYRROLATE PF 0.2 MG/ML IJ SOSY
PREFILLED_SYRINGE | INTRAMUSCULAR | Status: AC
  Filled 2018-10-12: qty 1

## 2018-10-12 MED ORDER — ROCURONIUM BROMIDE 50 MG/5ML IV SOSY
PREFILLED_SYRINGE | INTRAVENOUS | Status: DC | PRN
  Administered 2018-10-12: 40 mg via INTRAVENOUS
  Administered 2018-10-12: 10 mg via INTRAVENOUS

## 2018-10-12 MED ORDER — LIDOCAINE 2% (20 MG/ML) 5 ML SYRINGE
INTRAMUSCULAR | Status: DC | PRN
  Administered 2018-10-12: 60 mg via INTRAVENOUS

## 2018-10-12 MED ORDER — PROMETHAZINE HCL 25 MG/ML IJ SOLN: 6.2500 mg | INTRAMUSCULAR | Status: DC | PRN

## 2018-10-12 MED ORDER — SODIUM CHLORIDE 0.9 % IV SOLN
INTRAVENOUS | Status: DC | PRN
  Administered 2018-10-12: 25 ug/min via INTRAVENOUS

## 2018-10-12 MED ORDER — EPINEPHRINE PF 1 MG/ML IJ SOLN
INTRAMUSCULAR | Status: AC
  Filled 2018-10-12: qty 1

## 2018-10-12 MED ORDER — LACTATED RINGERS IV SOLN
INTRAVENOUS | Status: DC | PRN
  Administered 2018-10-12: 13:00:00 via INTRAVENOUS

## 2018-10-12 MED ORDER — 0.9 % SODIUM CHLORIDE (POUR BTL) OPTIME
TOPICAL | Status: DC | PRN
  Administered 2018-10-12: 1000 mL

## 2018-10-12 MED ORDER — GLYCOPYRROLATE PF 0.2 MG/ML IJ SOSY
PREFILLED_SYRINGE | INTRAMUSCULAR | Status: DC | PRN
  Administered 2018-10-12 (×2): .1 mg via INTRAVENOUS

## 2018-10-12 MED ORDER — MIDAZOLAM HCL 2 MG/2ML IJ SOLN
INTRAMUSCULAR | Status: AC
  Filled 2018-10-12: qty 2

## 2018-10-12 MED ORDER — LIDOCAINE 2% (20 MG/ML) 5 ML SYRINGE
INTRAMUSCULAR | Status: AC
  Filled 2018-10-12: qty 5

## 2018-10-12 MED ORDER — HYDROMORPHONE HCL 1 MG/ML IJ SOLN: 0.2500 mg | INTRAMUSCULAR | Status: DC | PRN

## 2018-10-12 MED ORDER — PROPOFOL 10 MG/ML IV BOLUS
INTRAVENOUS | Status: DC | PRN
  Administered 2018-10-12: 200 mg via INTRAVENOUS

## 2018-10-12 MED ORDER — SUGAMMADEX SODIUM 200 MG/2ML IV SOLN
INTRAVENOUS | Status: DC | PRN
  Administered 2018-10-12: 150 mg via INTRAVENOUS
  Administered 2018-10-12: 200 mg via INTRAVENOUS

## 2018-10-12 MED ORDER — PROPOFOL 10 MG/ML IV BOLUS
INTRAVENOUS | Status: AC
  Filled 2018-10-12: qty 20

## 2018-10-12 MED ORDER — FENTANYL CITRATE (PF) 100 MCG/2ML IJ SOLN
INTRAMUSCULAR | Status: DC | PRN
  Administered 2018-10-12 (×3): 50 ug via INTRAVENOUS

## 2018-10-12 MED ORDER — ONDANSETRON HCL 4 MG/2ML IJ SOLN
INTRAMUSCULAR | Status: DC | PRN
  Administered 2018-10-12: 4 mg via INTRAVENOUS

## 2018-10-12 MED ORDER — ACETAMINOPHEN 500 MG PO TABS
1000.0000 mg | ORAL_TABLET | Freq: Once | ORAL | Status: AC
  Administered 2018-10-12: 1000 mg via ORAL
  Filled 2018-10-12: qty 2

## 2018-10-12 MED ORDER — KETOROLAC TROMETHAMINE 30 MG/ML IJ SOLN: 30.0000 mg | Freq: Once | INTRAMUSCULAR | Status: DC | PRN

## 2018-10-12 MED ORDER — ONDANSETRON HCL 4 MG/2ML IJ SOLN
INTRAMUSCULAR | Status: AC
  Filled 2018-10-12: qty 2

## 2018-10-12 MED ORDER — FENTANYL CITRATE (PF) 250 MCG/5ML IJ SOLN
INTRAMUSCULAR | Status: AC
  Filled 2018-10-12: qty 5

## 2018-10-12 MED ORDER — ROCURONIUM BROMIDE 50 MG/5ML IV SOSY
PREFILLED_SYRINGE | INTRAVENOUS | Status: AC
  Filled 2018-10-12: qty 5

## 2018-10-12 MED ORDER — PHENYLEPHRINE 40 MCG/ML (10ML) SYRINGE FOR IV PUSH (FOR BLOOD PRESSURE SUPPORT)
PREFILLED_SYRINGE | INTRAVENOUS | Status: AC
  Filled 2018-10-12: qty 10

## 2018-10-12 MED ORDER — MIDAZOLAM HCL 2 MG/2ML IJ SOLN
INTRAMUSCULAR | Status: DC | PRN
  Administered 2018-10-12: 2 mg via INTRAVENOUS

## 2018-10-12 MED ORDER — PHENYLEPHRINE 40 MCG/ML (10ML) SYRINGE FOR IV PUSH (FOR BLOOD PRESSURE SUPPORT)
PREFILLED_SYRINGE | INTRAVENOUS | Status: DC | PRN
  Administered 2018-10-12: 80 ug via INTRAVENOUS

## 2018-10-12 SURGICAL SUPPLY — 22 items
ADAPTER VALVE BIOPSY EBUS (MISCELLANEOUS) IMPLANT
ADPTR VALVE BIOPSY EBUS (MISCELLANEOUS)
BRUSH CYTOL CELLEBRITY 1.5X140 (MISCELLANEOUS) IMPLANT
CANISTER SUCT 3000ML PPV (MISCELLANEOUS) ×2 IMPLANT
CONT SPEC 4OZ CLIKSEAL STRL BL (MISCELLANEOUS) ×2 IMPLANT
COVER BACK TABLE 60X90IN (DRAPES) ×2 IMPLANT
COVER WAND RF STERILE (DRAPES) ×1 IMPLANT
FORCEPS BIOP RJ4 1.8 (CUTTING FORCEPS) IMPLANT
GAUZE SPONGE 4X4 12PLY STRL (GAUZE/BANDAGES/DRESSINGS) ×2 IMPLANT
KIT CLEAN ENDO COMPLIANCE (KITS) ×3 IMPLANT
KIT TURNOVER KIT B (KITS) ×2 IMPLANT
MARKER SKIN DUAL TIP RULER LAB (MISCELLANEOUS) IMPLANT
NS IRRIG 1000ML POUR BTL (IV SOLUTION) ×2 IMPLANT
OIL SILICONE PENTAX (PARTS (SERVICE/REPAIRS)) ×2 IMPLANT
SYR 20ML ECCENTRIC (SYRINGE) ×2 IMPLANT
TOWEL GREEN STERILE (TOWEL DISPOSABLE) ×2 IMPLANT
TOWEL GREEN STERILE FF (TOWEL DISPOSABLE) ×2 IMPLANT
TRAP SPECIMEN MUCOUS 40CC (MISCELLANEOUS) IMPLANT
TUBE CONNECTING 20X1/4 (TUBING) ×2 IMPLANT
VALVE BIOPSY  SINGLE USE (MISCELLANEOUS) ×1
VALVE BIOPSY SINGLE USE (MISCELLANEOUS) ×1 IMPLANT
VALVE SUCTION BRONCHIO DISP (MISCELLANEOUS) ×2 IMPLANT

## 2018-10-12 NOTE — Brief Op Note (Signed)
      [address]       [place] [address]             [phone]     10/12/2018  2:58 PM  PATIENT:  Shannon [name]  58 y.o. female  PRE-OPERATIVE DIAGNOSIS:  ENDOBRONCHIAL LESION  POST-OPERATIVE DIAGNOSIS:  ENDOBRONCHIAL LESION  PROCEDURE:  Procedure(s): VIDEO BRONCHOSCOPY (N/A)  SURGEON:  Surgeon(s) and Role:    * Grace Isaac, MD - Primary   ANESTHESIA:   general  EBL:  0 mL   BLOOD ADMINISTERED:none  DRAINS: none   LOCAL MEDICATIONS USED:  LIDOCAINE   SPECIMEN:  No Specimen  DISPOSITION OF SPECIMEN:  PATHOLOGY  COUNTS:  YES  DICTATION: .Dragon Dictation  PLAN OF CARE: Discharge to home after PACU  PATIENT DISPOSITION:  PACU - hemodynamically stable.   Delay start of Pharmacological VTE agent (>24hrs) due to surgical blood loss or risk of bleeding: yes

## 2018-10-12 NOTE — Discharge Instructions (Signed)
Flexible Bronchoscopy, Care After This sheet gives you information about how to care for yourself after your procedure. Your health care provider may also give you more specific instructions. If you have problems or questions, contact your health care provider. What can I expect after the procedure? After the procedure, it is common to have the following symptoms for 24-48 hours:  A cough that is worse than it was before the procedure.  A low-grade fever.  A sore throat or hoarse voice.  Small streaks of blood in the mucus from your lungs (sputum), if tissue samples were removed (biopsy). Follow these instructions at home: Eating and drinking  Do not eat or drink anything (including water) for 2 hours after your procedure, or until your numbing medicine (local anesthetic) has worn off. Having a numb throat increases your risk of burning yourself or choking.  After your numbness is gone and your cough and gag reflexes have returned, you may start eating only soft foods and slowly drinking liquids.  The day after the procedure, return to your normal diet. Driving  Do not drive for 24 hours if you were given a medicine to help you relax (sedative).  Do not drive or use heavy machinery while taking prescription pain medicine. General instructions   Take over-the-counter and prescription medicines only as told by your health care provider.  Return to your normal activities as told by your health care provider. Ask your health care provider what activities are safe for you.  Do not use any products that contain nicotine or tobacco, such as cigarettes and e-cigarettes. If you need help quitting, ask your health care provider.  Keep all follow-up visits as told by your health care provider. This is important, especially if you had a biopsy taken. Get help right away if:  You have shortness of breath that gets worse.  You become light-headed or feel like you might faint.  You have  chest pain.  You cough up more than a small amount of blood.  The amount of blood you cough up increases. Summary  Common symptoms in the 24-48 hours following a flexible bronchoscopy include cough, low-grade fever, sore throat or hoarse voice, and blood-streaked mucus from the lungs (if you had a biopsy).  Do not eat or drink anything (including water) for 2 hours after your procedure, or until your local anesthetic has worn off. You can return to your normal diet the day after the procedure.  Get help right away if you develop worsening shortness of breath, have chest pain, become light-headed, or cough up more than a small amount of blood. This information is not intended to replace advice given to you by your health care provider. Make sure you discuss any questions you have with your health care provider. Document Released: 01/30/2005 Document Revised: 07/31/2016 Document Reviewed: 07/31/2016 Elsevier Interactive Patient Education  2019 [employer].

## 2018-10-12 NOTE — H&P (Signed)
[address]       [place] [address]             [phone]                    Shannon [name] Medical Record #[id number] Date of Birth: [birth year]   Referring: Irving Copas.* Primary Care: Rochel Brome, MD Primary Cardiologist: No primary care provider on file.  Chief Complaint:        Chief Complaint  Patient presents with  . Lung Lesion    f/u after super d CT    History of Present Illness:    Shannon [name] 59 y.o. female is seen in the office   for abnormal PET/ Ga 68 DOTATATE scan and now returns with a follow-up CT of the chest to compare to the one she had in August 2019.  the patient had undergone her first routine screening colonoscopy and was found to have a  submucosal mass was found in the posterior rectum approximately 3 to 4 cm from the anal verge. The mass was circular and well-circumscribed with a small central umbilication. IIt diameter measured 12-15 mm.  EUS with submucosal resection was performed July 18, 2018. 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 - LYMPHOVASCULAR INVASION IS IDENTIFIED. - TUMOR IS FOCALLY PRESENT AT THE CAUTERIZED TISSUE EDGE Ki-67 is approximately 1% in the tumor cells   A PEPET/ Ga 68 DOTATATETscan was performed January 22 with a small focus of uptake in the right hilum.  Patient was referred to thoracic surgery for further recommendations.  Patient is a former smoker up to a pack a day for 20 years but quit has been smoke-free for the last 12 years.  She has a history of asthma as a child and currently uses inhalers as needed    Her last CT scan was done in August 2019, under a different name in the PACS system.    Current Activity/ Functional Status:  Patient is independent with mobility/ambulation, transfers, ADL's, IADL's.   Zubrod Score: At the time of surgery this patient's most  appropriate activity status/level should be described as: _0     0    Normal activity, no symptoms _1     1    Restricted in physical strenuous activity but ambulatory, able to do out light work _2     2    Ambulatory and capable of self care, unable to do work activities, up and about               >50 % of waking hours                              _3     3    Only limited self care, in bed greater than 50% of waking hours _4     4    Completely disabled, no self care, confined to bed or chair _5     5    Moribund   Past Medical History:  Diagnosis Date  . Anxiety   . Arthritis   . Asthma   . Cancer ([place])    rectal, Lung  . Dyspnea    with exertion  . Emphysema of lung ([place])    patient denies  . History of kidney stones    passed  . HLD (hyperlipidemia)   . Pneumonia  02/2018  . Rectal tumor     Past Surgical History:  Procedure Laterality Date  . ABLATION     endometrial  . BIOPSY  10/10/2018   Procedure: BIOPSY;  Surgeon: Rush Landmark Telford Nab., MD;  Location: [facility];  Service: Gastroenterology;;  . COLONOSCOPY    . DOPPLER ECHOCARDIOGRAPHY    . ENDOSCOPIC MUCOSAL RESECTION N/A 10/10/2018   Procedure: ENDOSCOPIC MUCOSAL RESECTION;  Surgeon: Rush Landmark Telford Nab., MD;  Location: [facility];  Service: Gastroenterology;  Laterality: N/A;  . EUS N/A 07/18/2018   Procedure: LOWER ENDOSCOPIC ULTRASOUND (EUS);  Surgeon: Irving Copas., MD;  Location: [facility];  Service: Gastroenterology;  Laterality: N/A;  . EUS N/A 10/10/2018   Procedure: LOWER ENDOSCOPIC ULTRASOUND (EUS);  Surgeon: Irving Copas., MD;  Location: [facility];  Service: Gastroenterology;  Laterality: N/A;  . FLEXIBLE SIGMOIDOSCOPY N/A 07/18/2018   Procedure: FLEXIBLE SIGMOIDOSCOPY;  Surgeon: Irving Copas., MD;  Location: [facility];  Service: Gastroenterology;  Laterality: N/A;  . FLEXIBLE SIGMOIDOSCOPY N/A 10/10/2018   Procedure: FLEXIBLE SIGMOIDOSCOPY;  Surgeon:  Rush Landmark Telford Nab., MD;  Location: [facility];  Service: Gastroenterology;  Laterality: N/A;  . HEMOSTASIS CLIP PLACEMENT  10/10/2018   Procedure: HEMOSTASIS CLIP PLACEMENT;  Surgeon: Irving Copas., MD;  Location: [facility];  Service: Gastroenterology;;  . Lia Foyer LIFTING INJECTION  10/10/2018   Procedure: SUBMUCOSAL LIFTING INJECTION;  Surgeon: Irving Copas., MD;  Location: [facility];  Service: Gastroenterology;;  . TEE WITHOUT CARDIOVERSION  02/2018  . TONSILLECTOMY    . TUBAL LIGATION  1987    Family History  Problem Relation Age of Onset  . Heart disease Mother   . COPD Mother   . Emphysema Mother   . Diabetes Father   . Colon cancer Neg Hx   . Colon polyps Neg Hx   . Esophageal cancer Neg Hx   . Rectal cancer Neg Hx   . Stomach cancer Neg Hx    Father committed suicide at age 84 Mother died at age 48 of COPD complications One sister has thyroid disease Patient has 2 children who are healthy  Social History   Tobacco Use  Smoking Status Former Research scientist (life sciences)  . Years: 33.00  . Last attempt to quit: 2008  . Years since quitting: 12.2  Smokeless Tobacco Never Used    Social History   Substance and Sexual Activity  Alcohol Use Not Currently   Comment: occ- rare      Allergies  Allergen Reactions  . Hydrocodone Nausea Only    "terrible headache"  . Levaquin [Levofloxacin In D5w] Nausea And Vomiting       . Other     Dog Dander     No current facility-administered medications for this encounter.     Pertinent items are noted in HPI.   Review of Systems:     Cardiac Review of Systems: [Y] = yes  or   [ N ] = no   Chest Pain Aqua.Slicker  ]  Resting SOB [ N  ] Exertional SOB  Aqua.Slicker ]  [place] Limber Aqua.Slicker  ]   Pedal Edema [  N ]    Palpitations [N] Syncope  Aqua.Slicker ]   Presyncope [ N ]   General Review of Systems: [Y] = yes [  ]=no Constitional: recent weight change [  ];  Wt loss over the last 3 months [   ] anorexia [  ]; fatigue [  ]; nausea [  ];  night sweats [  ];  fever [  ]; or chills [  ];           Eye : blurred vision [  ]; diplopia [   ]; vision changes [  ];  Amaurosis fugax[  ]; Resp: cough [  ];  wheezing[  ];  hemoptysis[  ]; shortness of breath[  ]; paroxysmal nocturnal dyspnea[  ]; dyspnea on exertion[  ]; or orthopnea[  ];  GI:  gallstones[  ], vomiting[  ];  dysphagia[  ]; melena[  ];  hematochezia [  ]; heartburn[  ];   Hx of  Colonoscopy[ Y ]; GU: kidney stones [  ]; hematuria[  ];   dysuria [  ];  nocturia[  ];  history of     obstruction [  ]; urinary frequency [  ]             Skin: rash, swelling[  ];, hair loss[  ];  peripheral edema[  ];  or itching[  ]; Musculosketetal: myalgias[  ];  joint swelling[  ];  joint erythema[  ];  joint pain[  ];  back pain[  ];  Heme/Lymph: bruising[  ];  bleeding[  ];  anemia[  ];  Neuro: TIA[  ];  headaches[  ];  stroke[  ];  vertigo[  ];  seizures[  ];   paresthesias[  ];  difficulty walking[  ];  Psych:depression[  ]; anxiety[  ];  Endocrine: diabetes[  ];  thyroid dysfunction[  ];  Immunizations: Flu up to date Blue.Reese  ]; Pneumococcal up to date Florencio.Farrier  ];  Other:     PHYSICAL EXAMINATION: BP (!) 162/88   Pulse 69   Temp 98 F (36.7 C) (Oral)   Resp 20   Ht _0  (1.651 m)   Wt 85.7 kg   SpO2 100%   BMI 31.44 kg/m  General appearance: alert, cooperative and no distress Head: Normocephalic, without obvious abnormality, atraumatic Neck: no adenopathy, no carotid bruit, no JVD, supple, symmetrical, trachea midline and thyroid not enlarged, symmetric, no tenderness/mass/nodules Lymph nodes: Cervical, supraclavicular, and axillary nodes normal. Resp: clear to auscultation bilaterally Back: symmetric, no curvature. ROM normal. No CVA tenderness. Cardio: regular rate and rhythm, S1, S2 normal, no murmur, click, rub or gallop GI: soft, non-tender; bowel sounds normal; no masses,  no organomegaly Extremities: extremities normal, atraumatic, no cyanosis or edema and Homans sign is  negative, no sign of DVT Neurologic: Grossly normal  Diagnostic Studies & Laboratory data:     Recent Radiology Findings:  Ct Super D Chest Wo Contrast  Result Date: 10/04/2018 CLINICAL DATA:  Pulmonary nodule avid for neuroendocrine tumor PET imaging agent. History of rectal carcinoma. EXAM: CT CHEST WITHOUT CONTRAST TECHNIQUE: Multidetector CT imaging of the chest was performed using thin slice collimation for electromagnetic bronchoscopy planning purposes, without intravenous contrast. COMPARISON:  No tape PET scan 08/17/2018 FINDINGS: Cardiovascular: No significant vascular findings. Normal heart size. No pericardial effusion. Mediastinum/Nodes: No axillary supraclavicular adenopathy. No mediastinal hilar adenopathy. No pericardial effusion. Esophagus normal. Lungs/Pleura: The small nodule identified on comparison DOTATATE PET scan localizes to a small endobronchial lesion at the bifurcation of the RIGHT middle lobe bronchi. This small ovoid lesion measures 8 mm (image 76/8 axial series; image 100/6/sagittal series, and image 80/4/coronal series) No additional pulmonary nodules. Upper Abdomen: Limited view of the liver, kidneys, pancreas are unremarkable. Normal adrenal glands. Musculoskeletal: No aggressive osseous lesion. IMPRESSION: 1. Small endobronchial lesion at the takeoff of the RIGHT middle lobe bronchus  corresponds to the lesion of concern on comparison DOTATATE PET scan. 2. No lymphadenopathy. Electronically Signed   By: Suzy Bouchard M.D.   On: 10/04/2018 16:26    Nm Pet (netspot Ga 68 Dotatate) Skull Base To Mid Thigh  Result Date: 08/17/2018 CLINICAL DATA:  Carcinoma rectum identified on colonoscopy. Subsequent endoscopic mucosal resection. EXAM: NUCLEAR MEDICINE PET SKULL BASE TO THIGH TECHNIQUE: 5.1 mCi Ga 28 DOTATATE was injected intravenously. Full-ring PET imaging was performed from the skull base to thigh after the radiotracer. CT data was obtained and used for attenuation  correction and anatomic localization. COMPARISON:  None. FINDINGS: NECK No radiotracer activity in neck lymph nodes. Incidental CT findings: None CHEST Within the RIGHT hilum, a discrete focus of radiotracer accumulation with SUV max equal 11.7. On close inspection there is a small a parenchymal nodule measuring 6 mm (image 35/8) is immediately adjacent to the RIGHT lower lobe bronchus which corresponds the activity. No additional abnormal radiotracer in the thorax. No additional suspicious pulmonary nodules. Incidental CT finding:None ABDOMEN/PELVIS There is no activity within the rectum to suggest residual neuroendocrine tumor. Surgical clip noted distal rectum. There is no abnormal radiotracer accumulation within pelvic lymph nodes. No abdominal lymphadenopathy. No abnormal radiotracer accumulation within the liver. Physiologic activity noted in the adrenal glands, spleen and kidneys. Physiologic activity noted in the liver, spleen, adrenal glands and kidneys. Incidental CT findings:Lobular uterus consistent leiomyoma. SKELETON No focal activity to suggest skeletal metastasis. Incidental CT findings:None IMPRESSION: 1. No evidence residual neuroendocrine tumor within the rectum 2. No metastatic neuroendocrine tumor adenopathy in the abdomen or pelvis. 3. A single focus of intense uptake about the RIGHT hilum which localizes to a small pulmonary nodule. 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 Recommend follow-up CT in 3 to 6 months to demonstrate stability. I suspect this lesion would be difficult to resect. Electronically Signed   By: Suzy Bouchard M.D.   On: 08/17/2018 17:01     I have independently reviewed the above radiology studies  and reviewed the findings with the patient.   Recent Lab Findings: Lab Results  Component Value Date   WBC 14.9 (H) 10/12/2018   HGB 14.8 10/12/2018   HCT 47.3 (H) 10/12/2018   PLT 254 10/12/2018   GLUCOSE 90 10/12/2018   ALT 20 10/12/2018   AST 19  10/12/2018   NA 142 10/12/2018   K 3.8 10/12/2018   CL 108 10/12/2018   CREATININE 0.78 10/12/2018   BUN 13 10/12/2018   CO2 25 10/12/2018   INR 1.0 10/12/2018      Assessment / Plan:    #1 patient with known rectal carcinoid, submucosal resection without evidence on PET scan of pelvic spread.  There was a single area of uptake in the right hilum questionably associated with a 6 mm lung nodule.  Follow-up CT of the chest suggest that this nodule is endobronchial.  I discussed with the patient proceeding with bronchoscopy and endobronchial evaluation and possible endobronchial resection.  Risks and options are discussed with the patient in detail and she is agreeable with proceeding. The goals risks and alternatives of the planned surgical procedure Procedure(s): VIDEO BRONCHOSCOPY (N/A) POSSIBLE ENDOBRONCHIAL RESECTION (N/A)  have been discussed with the patient in detail. The risks of the procedure including death, infection, stroke, myocardial infarction, bleeding, blood transfusion have all been discussed specifically.  I have quoted Sherlene Shams a 1% of perioperative mortality and a complication rate as high as 10 %. The patient's questions have  been answered.Shannon [name] is willing  to proceed with the planned procedure.  Grace Isaac MD      [address] [place] [address] Office [phone]   Beeper [phone]  10/12/2018 1:03 PM

## 2018-10-12 NOTE — Anesthesia Procedure Notes (Signed)
Procedure Name: Intubation Date/Time: 10/12/2018 1:17 PM Performed by: Barrington Ellison, CRNA Pre-anesthesia Checklist: Patient identified, Emergency Drugs available, Suction available and Patient being monitored Patient Re-evaluated:Patient Re-evaluated prior to induction Oxygen Delivery Method: Circle System Utilized Preoxygenation: Pre-oxygenation with 100% oxygen Induction Type: IV induction Ventilation: Mask ventilation without difficulty Laryngoscope Size: Mac and 3 Grade View: Grade I Tube type: Oral Tube size: 8.5 mm Number of attempts: 1 Airway Equipment and Method: Stylet and Oral airway Placement Confirmation: ETT inserted through vocal cords under direct vision,  positive ETCO2 and breath sounds checked- equal and bilateral Secured at: 22 cm Tube secured with: Tape Dental Injury: Teeth and Oropharynx as per pre-operative assessment

## 2018-10-12 NOTE — Anesthesia Preprocedure Evaluation (Addendum)
Anesthesia Evaluation  Patient identified by MRN, date of birth, ID band Patient awake    Reviewed: Allergy & Precautions, NPO status , Patient's Chart, lab work & pertinent test results  Airway Mallampati: II  TM Distance: >3 FB Neck ROM: Full    Dental no notable dental hx.    Pulmonary asthma , COPD,  COPD inhaler, former smoker,    Pulmonary exam normal breath sounds clear to auscultation       Cardiovascular negative cardio ROS Normal cardiovascular exam Rhythm:Regular Rate:Normal  ECG: SR, rate 71  There was no ST segment deviation noted during stress. Pt walked for 9:00 of a Bruce protocol GXT. Peak HR of 153 which is 93% predicted max HR There were no ST or T wave changes to suggest ischemia Blood pressure demonstrated a hypertensive response to exercise. No arrhythmias This is interpreted as a negagative stress test. no evidence of ischemia .   Neuro/Psych PSYCHIATRIC DISORDERS Anxiety negative neurological ROS     GI/Hepatic negative GI ROS, Neg liver ROS,   Endo/Other  negative endocrine ROS  Renal/GU negative Renal ROS     Musculoskeletal negative musculoskeletal ROS (+)   Abdominal (+) + obese,   Peds  Hematology HLD   Anesthesia Other Findings ENDOBRONCHIAL LESION  Reproductive/Obstetrics                            Anesthesia Physical Anesthesia Plan  ASA: III  Anesthesia Plan: General   Post-op Pain Management:    Induction: Intravenous  PONV Risk Score and Plan: 3 and Ondansetron, Midazolam and Treatment may vary due to age or medical condition  Airway Management Planned: Oral ETT  Additional Equipment:   Intra-op Plan:   Post-operative Plan: Extubation in OR  Informed Consent: I have reviewed the patients History and Physical, chart, labs and discussed the procedure including the risks, benefits and alternatives for the proposed anesthesia with the patient  or authorized representative who has indicated his/her understanding and acceptance.     Dental advisory given  Plan Discussed with: CRNA  Anesthesia Plan Comments:         Anesthesia Quick Evaluation

## 2018-10-12 NOTE — Anesthesia Postprocedure Evaluation (Signed)
Anesthesia Post Note  Patient: Shannon [name]  Procedure(s) Performed: VIDEO BRONCHOSCOPY (N/A )     Patient location during evaluation: PACU Anesthesia Type: General Level of consciousness: awake and alert Pain management: pain level controlled Vital Signs Assessment: post-procedure vital signs reviewed and stable Respiratory status: spontaneous breathing, nonlabored ventilation, respiratory function stable and patient connected to nasal cannula oxygen Cardiovascular status: blood pressure returned to baseline and stable Postop Assessment: no apparent nausea or vomiting Anesthetic complications: no    Last Vitals:  Vitals:   10/12/18 1525 10/12/18 1538  BP: (!) 163/89 (!) 163/89  Pulse: 72 68  Resp: 11   Temp:  (!) 36.2 C  SpO2: 96% 99%    Last Pain:  Vitals:   10/12/18 1538  TempSrc:   PainSc: 0-No pain                 Yatzari Jonsson P Shadonna Benedick

## 2018-10-12 NOTE — Transfer of Care (Signed)
Immediate Anesthesia Transfer of Care Note  Patient: Shannon [name]  Procedure(s) Performed: VIDEO BRONCHOSCOPY (N/A ) POSSIBLE ENDOBRONCHIAL RESECTION (N/A )  Patient Location: PACU  Anesthesia Type:General  Level of Consciousness: awake and oriented  Airway & Oxygen Therapy: Patient Spontanous Breathing  Post-op Assessment: Report given to RN  Post vital signs: Reviewed and stable  Last Vitals:  Vitals Value Taken Time  BP 153/92 10/12/2018  2:25 PM  Temp    Pulse 88 10/12/2018  2:27 PM  Resp 11 10/12/2018  2:27 PM  SpO2 95 % 10/12/2018  2:27 PM  Vitals shown include unvalidated device data.  Last Pain:  Vitals:   10/12/18 1011  TempSrc:   PainSc: 0-No pain      Patients Stated Pain Goal: 3 (83/38/25 0539)  Complications: No apparent anesthesia complications

## 2018-10-13 ENCOUNTER — Encounter (HOSPITAL_COMMUNITY): Payer: Self-pay | Admitting: Cardiothoracic Surgery

## 2018-10-15 NOTE — Op Note (Signed)
NAME: Shannon [name], Shannon [name] MEDICAL RECORD [id number] ACCOUNT [id number] DATE OF BIRTH:Sep 01, 1960 FACILITY: [facility] LOCATION: [facility] PHYSICIAN:Tanvir Hipple BServando Snare, MD  OPERATIVE REPORT  DATE OF PROCEDURE:  10/12/2018  PREOPERATIVE DIAGNOSIS:  History of carcinoid of the bowel and positive PET scan in the right lung for possible carcinoid.    POSTOPERATIVE DIAGNOSIS: same   BRIEF HISTORY:  The patient had been evaluated for a rectal lesion that ultimately was resected submucosally and found to be a carcinoid.  The pertechnetate scan was performed that showed a question of a nodule in the right middle lobe, less than 1 cm in  size.  The patient had originally had a CT scan in the fall of 2019.  A followup CT scan was done suggesting that the area of uptake was intrabronchial.  To further evaluate the patient's positive scan, we recommended proceeding with bronchoscopy to  further evaluate the possibility of endobronchial resection versus more extensive resection.    Risks and options were discussed with the patient and she was agreeable with proceeding with bronchoscopy.  DESCRIPTION OF PROCEDURE:  The patient underwent general endotracheal anesthesia with a single lumen endotracheal tube.  She was intubated without difficulty.  Appropriate timeout was performed.  We then proceeded with video bronchoscopy with a 2.8 mm  video bronchoscope.  Photographs as we proceeded were obtained and submitted to the electronic medical record.  The left main stem, the trachea and carina appeared normal.  We passed the scope into the left mainstem bronchus into the subsegmental level  on the left without evidence of endobronchial lesions.  We then examined the right mainstem bronchus, takeoff of the right upper lobe,  all had appeared normal.  As we moved to the orifice of the middle lobe takeoff, a posterior broad-based bulging was  noted.  The overlying mucosa appeared normal.  Measurements of this appeared  the bulge started about 1.5 cm from the edge of the right middle lobe bronchus orifice and extended for at least 1 cm.  We switched scopes to a 10 mm scope and we were easily  able to pass over top of the bulging area.  There did not appear to be any indication of near obstruction of the middle lobe bronchus.  With the very wide base of the lesion,  we did not make any attempt to resect within normal overlying mucosa.  We did  not attempt biopsy in this area.  The lower lobe on the right segments appeared normal.  The scopes were removed.    Patient was  extubated and transferred to the recovery room for postoperative observation.    With more definitive evidence location and size of lesion , we will discuss with the patient options for treatment electively.    The patient tolerated the procedure without obvious complication and was transferred to the recovery room for postoperative observation. Please see brief op note for photos      AN/NUANCE  D:10/15/2018 T:10/15/2018 JOB:[id number]

## 2018-10-26 ENCOUNTER — Ambulatory Visit: Payer: BC Managed Care – PPO | Admitting: Cardiothoracic Surgery

## 2018-10-27 ENCOUNTER — Telehealth (INDEPENDENT_AMBULATORY_CARE_PROVIDER_SITE_OTHER): Payer: BC Managed Care – PPO | Admitting: Cardiothoracic Surgery

## 2018-10-27 ENCOUNTER — Ambulatory Visit: Payer: BC Managed Care – PPO | Admitting: Cardiothoracic Surgery

## 2018-10-27 DIAGNOSIS — C20 Malignant neoplasm of rectum: Secondary | ICD-10-CM

## 2018-10-27 DIAGNOSIS — R918 Other nonspecific abnormal finding of lung field: Secondary | ICD-10-CM | POA: Diagnosis not present

## 2018-10-27 DIAGNOSIS — C7A090 Malignant carcinoid tumor of the bronchus and lung: Secondary | ICD-10-CM

## 2018-10-27 NOTE — Telephone Encounter (Signed)
Shannon [name]       Shannon [name],Shannon [name] [id number]             [phone]                    Shannon [name] [facility] Medical Record #[id number] Date of Birth: [birth year]  Referring: No ref. provider found Primary Care: Shannon Brome, MD Primary Cardiologist: No primary care provider on file.  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 female who has been seen in the office   for abnormal PET/ Ga 68 DOTATATE scan , a follow-up CT of the chest to compare to the one she had in August 2019.  the patient had undergone her first routine screening colonoscopy and was found to have a  submucosal mass was found in the posterior rectum approximately 3 to 4 cm from the anal verge. The mass was circular and well-circumscribed with a small central umbilication. IIt diameter measured 12-15 mm.  EUS with submucosal resection was performed July 18, 2018. 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 - LYMPHOVASCULAR INVASION IS IDENTIFIED. - TUMOR IS FOCALLY PRESENT AT THE CAUTERIZED TISSUE EDGE Ki-67 is approximately 1% in the tumor cells   A PEPET/ Ga 68 DOTATATETscan was performed January 22 with a small focus of uptake in the right hilum.  Patient was referred to thoracic surgery for further recommendations.  Patient is a former smoker up to a pack a day for 20 years but quit has been smoke-free for the last 12 years.  She has a history of asthma as a child and currently uses inhalers as needed  Bronchoscopy was done with broad-based external compression of the right middle lobe bronchus.         Current Activity/ Functional Status:  Patient is independent with mobility/ambulation, transfers, ADL's, IADL's.   Zubrod Score: At the time of surgery this patient's most appropriate activity status/level should be  described as: _0     0    Normal activity, no symptoms _1     1    Restricted in physical strenuous activity but ambulatory, able to do out light work _2     2    Ambulatory and capable of self care, unable to do work activities, up and about               >50 % of waking hours                              _3     3    Only limited self care, in bed greater than 50% of waking hours _4     4    Completely disabled, no self care, confined to bed or chair _5     5    Moribund   Past Medical History:  Diagnosis Date  . Anxiety   . Arthritis   . Asthma   . Cancer ([place])    rectal, Lung  . Dyspnea    with exertion  . Emphysema of lung ([place])    patient denies  . History of kidney stones    passed  . HLD (hyperlipidemia)   . Pneumonia 02/2018  . Rectal tumor     Past Surgical History:  Procedure Laterality Date  . ABLATION  endometrial  . BIOPSY  10/10/2018   Procedure: BIOPSY;  Surgeon: Rush Landmark Telford Nab., MD;  Location: [facility];  Service: Gastroenterology;;  . COLONOSCOPY    . DOPPLER ECHOCARDIOGRAPHY    . ENDOSCOPIC MUCOSAL RESECTION N/A 10/10/2018   Procedure: ENDOSCOPIC MUCOSAL RESECTION;  Surgeon: Rush Landmark Telford Nab., MD;  Location: [facility];  Service: Gastroenterology;  Laterality: N/A;  . EUS N/A 07/18/2018   Procedure: LOWER ENDOSCOPIC ULTRASOUND (EUS);  Surgeon: Irving Copas., MD;  Location: [facility];  Service: Gastroenterology;  Laterality: N/A;  . EUS N/A 10/10/2018   Procedure: LOWER ENDOSCOPIC ULTRASOUND (EUS);  Surgeon: Irving Copas., MD;  Location: [facility];  Service: Gastroenterology;  Laterality: N/A;  . FLEXIBLE SIGMOIDOSCOPY N/A 07/18/2018   Procedure: FLEXIBLE SIGMOIDOSCOPY;  Surgeon: Irving Copas., MD;  Location: [facility];  Service: Gastroenterology;  Laterality: N/A;  . FLEXIBLE SIGMOIDOSCOPY N/A 10/10/2018   Procedure: FLEXIBLE SIGMOIDOSCOPY;  Surgeon: Rush Landmark Telford Nab., MD;  Location: [facility];   Service: Gastroenterology;  Laterality: N/A;  . HEMOSTASIS CLIP PLACEMENT  10/10/2018   Procedure: HEMOSTASIS CLIP PLACEMENT;  Surgeon: Irving Copas., MD;  Location: [facility];  Service: Gastroenterology;;  . Lia Foyer LIFTING INJECTION  10/10/2018   Procedure: SUBMUCOSAL LIFTING INJECTION;  Surgeon: Irving Copas., MD;  Location: [facility];  Service: Gastroenterology;;  . TEE WITHOUT CARDIOVERSION  02/2018  . TONSILLECTOMY    . TUBAL LIGATION  1987  . VIDEO BRONCHOSCOPY N/A 10/12/2018   Procedure: VIDEO BRONCHOSCOPY;  Surgeon: Grace Isaac, MD;  Location: [facility] OR;  Service: Thoracic;  Laterality: N/A;    Family History  Problem Relation Age of Onset  . Heart disease Mother   . COPD Mother   . Emphysema Mother   . Diabetes Father   . Colon cancer Neg Hx   . Colon polyps Neg Hx   . Esophageal cancer Neg Hx   . Rectal cancer Neg Hx   . Stomach cancer Neg Hx    Father committed suicide at age 53 Mother died at age 15 of COPD complications One sister has thyroid disease Patient has 2 children who are healthy  Social History   Tobacco Use  Smoking Status Former Research scientist (life sciences)  . Years: 33.00  . Last attempt to quit: 2008  . Years since quitting: 12.2  Smokeless Tobacco Never Used    Social History   Substance and Sexual Activity  Alcohol Use Not Currently   Comment: occ- rare      Allergies  Allergen Reactions  . Hydrocodone Nausea Only    "terrible headache"  . Levaquin [Levofloxacin In D5w] Nausea And Vomiting       . Other     Dog Dander     Current Outpatient Medications  Medication Sig Dispense Refill  . albuterol (PROVENTIL HFA;VENTOLIN HFA) 108 (90 Base) MCG/ACT inhaler Inhale 2 puffs into the lungs every 6 (six) hours as needed for wheezing or shortness of breath.    Marland Kitchen atorvastatin (LIPITOR) 20 MG tablet Take 20 mg by mouth every evening.     . busPIRone (BUSPAR) 10 MG tablet Take 5 mg by mouth 2 (two) times daily.     . fluticasone  (FLONASE) 50 MCG/ACT nasal spray Place 1 spray into both nostrils daily as needed for allergies or rhinitis.    Marland Kitchen HYDROcodone-acetaminophen (NORCO) 5-325 MG tablet Take 1 tablet by mouth every 6 (six) hours as needed for severe pain. 10 tablet 0  . ibuprofen (ADVIL,MOTRIN) 200 MG tablet Take 400-600 mg  by mouth daily as needed for headache or moderate pain.    Marland Kitchen LORazepam (ATIVAN) 0.5 MG tablet Take 0.5 mg by mouth every evening.     . methocarbamol (ROBAXIN) 500 MG tablet Take 1 tablet (500 mg total) by mouth every 8 (eight) hours as needed for muscle spasms. 15 tablet 0  . predniSONE (STERAPRED UNI-PAK 21 TAB) 5 MG (21) TBPK tablet Take 5 mg by mouth daily.     No current facility-administered medications for this visit.     Pertinent items are noted in HPI.   Review of Systems:     Cardiac Review of Systems: [Y] = yes  or   [ N ] = no   Chest Pain [n ]  Resting SOB [ n ] Exertional SOB  [n]  Orthopnea [n  ]   Pedal Edema [  n]    Palpitations [n Syncope  [n]   Presyncope [ n]   General Review of Systems: [Y] = yes [  ]=no Constitional: recent weight change [  ];  Wt loss over the last 3 months [   ] anorexia [  ]; fatigue [  ]; nausea [  ]; night sweats [  ]; fever [  ]; or chills [  ];           Eye : blurred vision [  ]; diplopia [   ]; vision changes [  ];  Amaurosis fugax[  ]; Resp: cough [  ];  wheezing[  ];  hemoptysis[  ]; shortness of breath[  ]; paroxysmal nocturnal dyspnea[  ]; dyspnea on exertion[  ]; or orthopnea[  ];  GI:  gallstones[  ], vomiting[  ];  dysphagia[  ]; melena[  ];  hematochezia [  ]; heartburn[  ];   Hx of  Colonoscopy[ Y ]; GU: kidney stones [  ]; hematuria[  ];   dysuria [  ];  nocturia[  ];  history of     obstruction [  ]; urinary frequency [  ]             Skin: rash, swelling[  ];, hair loss[  ];  peripheral edema[  ];  or itching[  ]; Musculosketetal: myalgias[  ];  joint swelling[  ];  joint erythema[  ];  joint pain[  ];  back pain[  ];  Heme/Lymph:  bruising[  ];  bleeding[  ];  anemia[  ];  Neuro: TIA[  ];  headaches[  ];  stroke[  ];  vertigo[  ];  seizures[  ];   paresthesias[  ];  difficulty walking[  ];  Psych:depression[  ]; anxiety[  ];  Endocrine: diabetes[  ];  thyroid dysfunction[  ];  Immunizations: Flu up to date Blue.Reese  ]; Pneumococcal up to date Florencio.Farrier  ];  Other:     PHYSICAL EXAMINATION: Virtual visit patient at home, I am in the office on the telephone with the patient   Diagnostic Studies & Laboratory data:     Recent Radiology Findings:  Ct Super D Chest Wo Contrast  Result Date: 10/04/2018 CLINICAL DATA:  Pulmonary nodule avid for neuroendocrine tumor PET imaging agent. History of rectal carcinoma. EXAM: CT CHEST WITHOUT CONTRAST TECHNIQUE: Multidetector CT imaging of the chest was performed using thin slice collimation for electromagnetic bronchoscopy planning purposes, without intravenous contrast. COMPARISON:  No tape PET scan 08/17/2018 FINDINGS: Cardiovascular: No significant vascular findings. Normal heart size. No pericardial effusion. Mediastinum/Nodes: No axillary supraclavicular adenopathy. No mediastinal hilar  adenopathy. No pericardial effusion. Esophagus normal. Lungs/Pleura: The small nodule identified on comparison DOTATATE PET scan localizes to a small endobronchial lesion at the bifurcation of the RIGHT middle lobe bronchi. This small ovoid lesion measures 8 mm (image 76/8 axial series; image 100/6/sagittal series, and image 80/4/coronal series) No additional pulmonary nodules. Upper Abdomen: Limited view of the liver, kidneys, pancreas are unremarkable. Normal adrenal glands. Musculoskeletal: No aggressive osseous lesion. IMPRESSION: 1. Small endobronchial lesion at the takeoff of the RIGHT middle lobe bronchus corresponds to the lesion of concern on comparison DOTATATE PET scan. 2. No lymphadenopathy. Electronically Signed   By: Suzy Bouchard M.D.   On: 10/04/2018 16:26    Nm Pet (netspot Ga 68 Dotatate)  Skull Base To Mid Thigh  Result Date: 08/17/2018 CLINICAL DATA:  Carcinoma rectum identified on colonoscopy. Subsequent endoscopic mucosal resection. EXAM: NUCLEAR MEDICINE PET SKULL BASE TO THIGH TECHNIQUE: 5.1 mCi Ga 54 DOTATATE was injected intravenously. Full-ring PET imaging was performed from the skull base to thigh after the radiotracer. CT data was obtained and used for attenuation correction and anatomic localization. COMPARISON:  None. FINDINGS: NECK No radiotracer activity in neck lymph nodes. Incidental CT findings: None CHEST Within the RIGHT hilum, a discrete focus of radiotracer accumulation with SUV max equal 11.7. On close inspection there is a small a parenchymal nodule measuring 6 mm (image 35/8) is immediately adjacent to the RIGHT lower lobe bronchus which corresponds the activity. No additional abnormal radiotracer in the thorax. No additional suspicious pulmonary nodules. Incidental CT finding:None ABDOMEN/PELVIS There is no activity within the rectum to suggest residual neuroendocrine tumor. Surgical clip noted distal rectum. There is no abnormal radiotracer accumulation within pelvic lymph nodes. No abdominal lymphadenopathy. No abnormal radiotracer accumulation within the liver. Physiologic activity noted in the adrenal glands, spleen and kidneys. Physiologic activity noted in the liver, spleen, adrenal glands and kidneys. Incidental CT findings:Lobular uterus consistent leiomyoma. SKELETON No focal activity to suggest skeletal metastasis. Incidental CT findings:None IMPRESSION: 1. No evidence residual neuroendocrine tumor within the rectum 2. No metastatic neuroendocrine tumor adenopathy in the abdomen or pelvis. 3. A single focus of intense uptake about the RIGHT hilum which localizes to a small pulmonary nodule. 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 Recommend follow-up CT in 3 to 6 months to demonstrate stability. I suspect this lesion would be difficult to resect.  Electronically Signed   By: Suzy Bouchard M.D.   On: 08/17/2018 17:01     I have independently reviewed the above radiology studies  and reviewed the findings with the patient.   Recent Lab Findings: Lab Results  Component Value Date   WBC 14.9 (H) 10/12/2018   HGB 14.8 10/12/2018   HCT 47.3 (H) 10/12/2018   PLT 254 10/12/2018   GLUCOSE 90 10/12/2018   ALT 20 10/12/2018   AST 19 10/12/2018   NA 142 10/12/2018   K 3.8 10/12/2018   CL 108 10/12/2018   CREATININE 0.78 10/12/2018   BUN 13 10/12/2018   CO2 25 10/12/2018   INR 1.0 10/12/2018      Assessment / Plan:    #1 patient with known rectal carcinoid, submucosal resection without evidence on PET scan of pelvic spread.  There was a single area of uptake in the right hilum questionably associated with a 6 mm lung nodule.    At the time of bronchoscopy there was no definite endobronchial lesion to biopsy but appeared to be external  compression of the bronchus externally no attempt at  resection was done.  I discussed these findings with the patient on the phone today with the covered infection currently affecting risk of hospitalization I recommend to the patient that we not proceed with any resection at this point I will plan to see her back in the office in 3 to 4 weeks and then discussed with her possible middle lobectomy for resection of the presumed carcinoid.  I discussed with the patient that typically progression of carcinoids would be very slow that any delay because of the COVID  situation would not be detrimental to her.  We will plan on her returning to be seen in 3 to 4 weeks depending on the tide of the covert endemic    I  spent 10 minutes with  the patient with direct telephone call while I was in the office since she was at home and greater then 50% of the time was spent in counseling and coordination of care.    Grace Isaac MD      [name] [place],[place] [address] Office [phone]    Beeper [phone]  10/27/2018 1:30 PM

## 2018-11-24 ENCOUNTER — Ambulatory Visit: Payer: BC Managed Care – PPO | Admitting: Cardiothoracic Surgery

## 2018-11-24 ENCOUNTER — Other Ambulatory Visit: Payer: Self-pay

## 2018-11-24 ENCOUNTER — Telehealth (INDEPENDENT_AMBULATORY_CARE_PROVIDER_SITE_OTHER): Payer: BC Managed Care – PPO | Admitting: Cardiothoracic Surgery

## 2018-11-24 VITALS — Ht 65.0 in | Wt 185.0 lb

## 2018-11-24 DIAGNOSIS — C7A026 Malignant carcinoid tumor of the rectum: Secondary | ICD-10-CM

## 2018-11-24 DIAGNOSIS — R911 Solitary pulmonary nodule: Secondary | ICD-10-CM

## 2018-11-24 DIAGNOSIS — C7A090 Malignant carcinoid tumor of the bronchus and lung: Secondary | ICD-10-CM

## 2018-11-24 NOTE — Patient Instructions (Signed)
Video-Assisted Thoracic Surgery Video-assisted thoracic surgery (VATS) is a procedure that allows your surgeon to look inside your chest and perform minor procedures as needed. The thoracic area is between the neck and abdomen. VATS is commonly done to:  Study or diagnose problems in the chest.  Remove a tissue sample (biopsy) to be examined.  Put medicines directly into the chest.  Remove collections of fluid, pus, or blood.  Remove tumors.  Remove a part of a lung (lobectomy).  Treat some problems with the spine, including: ? Abnormal curves (scoliosis or kyphosis). ? Breaks (fractures). ? Tumors. VATS is done using thoracoscopy. Thoracoscopy is a procedure in which a thin tube with a light and camera on the end (thoracoscope) is inserted through a small incision in the chest wall. The thoracoscope sends images to a video monitor that your surgeon will use to view the inside of your chest. Tell a health care provider about:  Any allergies you have.  All medicines you are taking, including vitamins, herbs, eye drops, creams, and over-the-counter medicines.  Any problems you or family members have had with anesthetic medicines.  Any surgeries you have had.  Any medical conditions you have.  Any blood disorders you have.  Whether you are pregnant or may be pregnant. What are the risks? Generally, this is a safe procedure. However, problems may occur, including:  Infection.  Severe bleeding (hemorrhage).  Allergic reaction to medicines.  Damage to structures or organs in the chest, such as nerves.  Lung infection (pneumonia).  Inability to complete the procedure. If this is the case, your chest may need to be opened with a large incision (thoracotomy). What happens before the procedure? Medicines  Ask your health care provider about: ? Changing or stopping your regular medicines. This is especially important if you are taking diabetes medicines or blood  thinners. ? Taking medicines such as aspirin and ibuprofen. These medicines can thin your blood. Do not take these medicines before your procedure if your health care provider instructs you not to.  You may be given antibiotic medicine to help prevent infection. Staying hydrated Follow instructions from your health care provider about hydration, which may include:  Up to 2 hours before the procedure - you may continue to drink clear liquids, such as water, clear fruit juice, black coffee, and plain tea. Eating and drinking restrictions Follow instructions from your health care provider about eating and drinking, which may include:  8 hours before the procedure - stop eating heavy meals or foods such as meat, fried foods, or fatty foods.  6 hours before the procedure - stop eating light meals or foods, such as toast or cereal.  6 hours before the procedure - stop drinking milk or drinks that contain milk.  2 hours before the procedure - stop drinking clear liquids. General instructions  Ask your health care provider how your surgical site will be marked or identified.  You may be asked to shower with a germ-killing soap.  You may have a blood or urine sample taken.  You may have imaging tests, such as: ? Chest X-ray. ? Electrocardiogram (ECG). ? Ultrasound. ? CT scan.  Do not use any products that contain nicotine or tobacco for as long as possible before your procedure. These include cigarettes and e-cigarettes. If you need help quitting, ask your health care provider.  Plan to have someone take you home from the hospital or clinic.  If you will be going home right after the procedure,  plan to have someone with you for 24 hours. What happens during the procedure?  To lower your risk of infection: ? Your health care team will wash or sanitize their hands. ? Your skin will be washed with soap.  An IV tube will be inserted into one of your veins.  You will be given one or  more of the following: ? A medicine to make you fall asleep (general anesthetic). ? A medicine to help you relax (sedative). ? A medicine that is injected into an area of your body to numb everything below the injection site (regional anesthetic). This is less common for this procedure. It may be given if you are not able to tolerate general anesthetic.  A thin tube (catheter) will be inserted into your bladder and your tube that carries urine out of your body (urethra). The catheter will drain your urine.  1-4 small incisions will be made in your chest. The number of incisions depends on the purpose of your procedure.  The thoracoscope will be inserted into your incision(s) and used to view the inside of your chest.  One of your lungs will be deflated. This makes it easier for your surgeon to see the area.  Other surgical instruments may be inserted through your incision(s) to perform necessary procedures.  After any procedures are done, your lung will be inflated.  A chest tube may be inserted through an incision to drain excess fluid from the surgical area.  Any remaining incisions will be closed with stitches (sutures) or staples.  A bandage (dressing) may be placed over your incision(s). The procedure may vary among health care providers and hospitals. What happens after the procedure?  Your blood pressure, heart rate, breathing rate, and blood oxygen level will be monitored until the medicines you were given have worn off.  You may have a chest tube draining fluid from the surgical area. The chest tube will be closely monitored for signs of fluid or air buildup in your lungs.  You may continue to receive fluids and medicines through an IV tube.  You may have to wear compression stockings. These stockings help to prevent blood clots and reduce swelling in your legs.  Do not drive for 24 hours if you were given a sedative. Summary  Video-assisted thoracic surgery (VATS) is a  procedure that allows your surgeon to look inside your chest to study or diagnose problems in your thoracic area.  VATS is done by inserting a thin tube with a light and camera on the end (thoracoscope) through a small incision in the chest wall.  After the procedure, you may have a chest tube draining fluid from the surgical area. The chest tube will be closely monitored for signs of fluid or air buildup in your lungs. This information is not intended to replace advice given to you by your health care provider. Make sure you discuss any questions you have with your health care provider. Document Released: 11/07/2012 Document Revised: 06/29/2016 Document Reviewed: 06/29/2016 Elsevier Interactive Patient Education  2019 [place] Thoracic Surgery, Care After This sheet gives you information about how to care for yourself after your procedure. Your doctor may also give you more specific instructions. If you have problems or questions, contact your doctor. What can I expect after the procedure? After the procedure, it is common to have:  Some pain and soreness in your chest.  Pain when you breathe in (inhale) and cough.  Trouble pooping (constipation).  Tiredness (  fatigue).  Trouble sleeping. Follow these instructions at home: Preventing lung infection (pneumonia)  Take deep breaths or do breathing exercises as told by your doctor.  Cough often. Coughing is important to clear thick spit (phlegm) and open your lungs.  You can make coughing hurt less if you try supporting (splinting) your chest. Try one of these when you cough: ? Hold a pillow against your chest. ? Place both hands flat on top of your cut.  Use an incentive spirometer as told by your doctor. This is a tool that measures how well you can fill your lungs with each breath.  Do lung therapy (pulmonary rehabilitation) as told. Medicines  Take over-the-counter or prescription medicines only as told  by your doctor.  If you have pain, take pain-relieving medicine before your pain gets very bad. Doing this will help you breathe and cough more comfortably.  If you were prescribed an antibiotic medicine, take it as told by your doctor. Do not stop taking the antibiotic even if you start to feel better. Activity  Ask your doctor what activities are safe for you.  Avoid activities that use your chest muscles for 3-4 weeks or longer.  Do not lift anything that is heavier than 10 lb (4.5 kg), or the limit that your doctor tells you, until he or she says that it is safe. Cut (incision) care  Follow instructions from your doctor about how to take care of your cut(s) from surgery. Make sure you: ? Wash your hands with soap and water before you change your bandage (dressing). If you cannot use soap and water, use hand sanitizer. ? Change your bandage as told by your doctor. ? Leave stitches (sutures), skin glue, or skin tape (adhesive) strips in place. They may need to stay in place for 2 weeks or longer. If tape strips get loose and curl up, you may trim the loose edges. Do not remove tape strips completely unless your doctor says it is okay.  Keep your bandage dry until it has been removed.  Every day, check the area around your cut(s) for signs of infection. Check for: ? Redness, swelling, or pain. ? Fluid or blood. ? Warmth. ? Pus or a bad smell. Bathing  Do not take baths, swim, or use a hot tub until your doctor approves. You may take showers.  After your bandage is removed, use soap and water to gently wash the your cut(s) from surgery. Do not use anything else to clean your cut(s) unless your doctor tells you to do this. Driving   Do not drive until your doctor approves.  Do not drive or use heavy machinery while taking prescription pain medicine. Eating and drinking  Eat a healthy diet as told by your doctor. A healthy diet includes: ? Lots of fresh fruits and vegetables. ?  Whole grains. ? Low-fat (lean) proteins.  Limit foods that are high in fat and processed sugars. These include fried and sweet foods.  Drink enough fluid to keep your pee (urine) clear or light yellow. General instructions   To prevent or treat trouble pooping while you are taking prescription pain medicine, your doctor may recommend that you: ? Take over-the-counter or prescription medicines. ? Eat foods that have a lot of fiber. These include beans, fresh fruits and vegetables, and whole grains.  Do not use any products that contain nicotine or tobacco. These include cigarettes and e-cigarettes. If you need help quitting, ask your doctor.  Avoid being where people are  smoking (avoid secondhand smoke).  Wear compression stockings as told by your doctor. These stockings help you: ? Not get blood clots in your legs. ? Have less swelling in your legs.  If you have a chest tube, care for it as told by your doctor.  Do not travel by airplane during the 2 weeks after your chest tube is removed, or until your doctor says that this is safe.  Keep all follow-up visits as told by your doctor. This is important. Contact a doctor if:  You have redness, swelling, or pain around a cut from surgery.  You have fluid or blood coming from a cut from surgery.  Your cut(s) from surgery feel warm to the touch.  You have pus or a bad smell coming from a cut from surgery.  You have a fever or chills.  You feel sick to your stomach (nauseous).  You throw up (vomit).  You have pain that does not get better with medicine. Get help right away if:  You have chest pain.  Your heart is fluttering or beating fast.  You start to have a rash.  You have shortness of breath.  You have trouble breathing.  You are confused.  You have trouble talking or understanding.  You feel weak, light-headed, or dizzy.  You faint. Summary  To help prevent lung infection (pneumonia), take deep breaths  or do breathing exercises as told by your doctor.  Cough often. This is important for clearing chest fluid (phlegm). You can make coughing hurt less if you hold a pillow to your chest or put your hands flat on the cut(s) when you cough (do splinting).  Do not drive until your doctor approves.  Every day, check your cut(s) for signs of infection. These signs can be redness, swelling, pain, fluid, blood, warmth, pus, or a bad smell.  Eat a healthy diet. This includes lots of fresh fruits and vegetables, whole grains, and low-fat (lean) proteins. This information is not intended to replace advice given to you by your health care provider. Make sure you discuss any questions you have with your health care provider. Document Released: 11/07/2012 Document Revised: 06/22/2016 Document Reviewed: 06/22/2016 Elsevier Interactive Patient Education  2019 [employer]    Lung Resection  A lung resection is a procedure to remove part or all of a lung. An entire lung may be removed (pneumonectomy), or only part of it may be removed (lobectomy). A lung resection may be done as an open surgery or as a minimally invasive surgery. A lung resection is most often done to remove a tumor or cancer, but it may be done to treat other conditions. The procedure can relieve symptoms and keep the problem from getting worse. Tell a health care provider about:  Any allergies you have.  All medicines you are taking, including vitamins, herbs, eye drops, creams, and over-the-counter medicines.  Any problems you or family members have had with anesthetic medicines.  Any blood disorders you have.  Any surgeries you have had.  Any medical conditions you have.  Whether you are pregnant or may be pregnant. What are the risks? Generally, this is a safe procedure. However, problems may occur, including:  Excessive bleeding.  Infection.  Reaction to anesthesia.  Allergic reaction to medicines.  Blood clots.   Injury to a nerve or blood vessel.  Problems breathing.  Heart problems.  Stroke. What happens before the procedure? Staying hydrated Follow instructions from your health care provider about hydration, which may  include:  Up to 2 hours before the procedure - you may continue to drink clear liquids, such as water, clear fruit juice, black coffee, and plain tea. Eating and drinking restrictions Follow instructions from your health care provider about eating and drinking, which may include:  8 hours before the procedure - stop eating heavy meals or foods such as meat, fried foods, or fatty foods.  6 hours before the procedure - stop eating light meals or foods, such as toast or cereal.  6 hours before the procedure - stop drinking milk or drinks that contain milk.  2 hours before the procedure - stop drinking clear liquids. Medicines Ask your health care provider about:  Changing or stopping your regular medicines. This is especially important if you are taking diabetes medicines or blood thinners.  Taking medicines such as aspirin and ibuprofen. These medicines can thin your blood. Do not take these medicines unless your health care provider tells you to take them.  Taking over-the-counter medicines, vitamins, herbs, and supplements. Tests You may have tests done before the procedure, including:  Blood and urine tests.  Imaging tests, such as X-rays, CT, MRI, or PET scans.  Bronchoscopy. In this procedure, a health care provider uses a flexible tube (bronchoscope) to look at the inside of your airways.  Pulmonary function tests. These are done to check how well your lungs work.  Heart testing. This is done to check heart function before the procedure.  Lymph node sampling. This may be done to see if you have a tumor that has spread. General instructions  Plan to have someone take you home from the hospital or clinic.  Plan to have a responsible adult care for you for at  least 24 hours after you leave the hospital or clinic. This is important.  Do not use any products that contain nicotine or tobacco for as long as possible before your procedure. These include cigarettes and e-cigarettes. If you need help quitting, ask your health care provider.  Ask your health care provider what steps will be taken to help prevent infection. These may include: ? Removing hair at the surgery site. ? Washing skin with a germ-killing soap. ? Taking antibiotic medicine. What happens during the procedure?  An IV will be inserted into one of your veins. You will be given one or both of the following: ? A medicine to help you relax (sedative). ? A medicine to make you fall asleep (general anesthetic).  A breathing tube will be placed in your throat.  A thin tube (catheter) may be inserted into the part of your body that drains urine from the bladder (urethra). The catheter will drain your urine.  Your health care provider will make a large incision on your side (open lung surgery) or several small incisions over your chest area (minimally invasive surgery).  Your health care provider will carefully cut any tissues leading to the area of the lung being treated.  The lung or part of the lung will then be removed. Lymph nodes near the lung may also be removed for testing.  Your health care provider will check inside your chest to make sure there is no bleeding in or around the lungs.  Your health care provider may put tubes into your chest to drain extra fluid and air after surgery.  Your incisions will be closed. This may be done using stitches (sutures), staples, skin glue, or skin tape (adhesive) strips.  A bandage (dressing) may be placed over your  incisions. The procedure may vary among health care providers and hospitals. What happens after the procedure?  Your blood pressure, heart rate, breathing rate, and blood oxygen level will be monitored until you leave the  hospital or clinic.  Right after surgery, you may: ? Be moved to the intensive care unit (ICU). ? Continue to have a tube to help you breathe or have a urinary catheter. ? Have an IV for fluids and medicines. ? Remain on a respirator, if assistance is needed to help you breathe. ? Start respiratory therapy in the ICU. This will help your other lung to get strong and stay healthy. ? Be given medicine to help with pain and nausea.  You may have to wear compression stockings. These stockings help to prevent blood clots and reduce swelling in your legs.  As you continue to recover: ? You will be moved to a regular hospital room. Therapy will continue. ? You may be released to go home or to an extended care facility. Summary  A lung resection is a procedure to remove part or all of a lung. It can be done as an open surgery or a minimally invasive surgery.  A lung resection is most often done to remove a tumor or cancer, but it may be done to treat other conditions.  After surgery, respiratory therapy will be prescribed to help your lung recover and become stronger. This information is not intended to replace advice given to you by your health care provider. Make sure you discuss any questions you have with your health care provider. Document Released: 10/03/2002 Document Revised: 07/26/2017 Document Reviewed: 07/26/2017 Elsevier Interactive Patient Education  2019 [employer].

## 2018-11-24 NOTE — Progress Notes (Signed)
3 week f/u, prefers a Virtual visit  Further discuss scheduling surgery Reviewed medications, no changes

## 2018-11-24 NOTE — Progress Notes (Signed)
[address]       [place] [address]             [phone]         Telehealth Visit     Virtual Visit via Video Note   This visit type was conducted due to national recommendations for restrictions regarding the COVID-19 Pandemic (e.g. social distancing) in an effort to limit this patient's exposure and mitigate transmission in our community.  Due to her co-morbid illnesses, this patient is at least at moderate risk for complications without adequate follow up.  This format is felt to be most appropriate for this patient at this time.  All issues noted in this document were discussed and addressed.  A limited physical exam was performed with this format.             [place] Record #[id number] Date of Birth: [birth year]  Referring: Irving Copas.* Primary Care: Rochel Brome, MD Primary Cardiologist: No primary care provider on file.  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   I discussed limitations of the evaluation and management  of patients remotely without  the benefit of physical exam.  The patient was agreeable with proceeding with a remote/ not face to face visit video visit      History of Present Illness:    KINDAL PONTI 58 y.o. female who has been seen in the office   for abnormal PET/ Ga 68 DOTATATE scan , a follow-up CT of the chest to compare to the one she had in August 2019.  the patient had undergone her first routine screening colonoscopy and was found to have a  submucosal mass was found in the posterior rectum approximately 3 to 4 cm from the anal verge. The mass was circular and well-circumscribed with a small central umbilication. IIt diameter measured 12-15 mm.  EUS with submucosal  resection was performed July 18, 2018. 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 - LYMPHOVASCULAR INVASION IS IDENTIFIED. - TUMOR IS FOCALLY PRESENT AT THE CAUTERIZED TISSUE EDGE Ki-67 is approximately 1% in the tumor cells   A PEPET/ Ga 68 DOTATATETscan was performed January 22 with a small focus of uptake in the right hilum.  Patient was referred to thoracic surgery for further recommendations.  Patient is a former smoker up to a pack a day for 20 years but quit has been smoke-free for the last 12 years.  She has a history of asthma as a child and currently uses inhalers as needed  Bronchoscopy was done with broad-based external compression of the right middle lobe bronchus.       Since patient was last seen she has been careful about sexual contact.  She has had no fever chills or other respiratory symptoms. Current Activity/ Functional Status:  Patient is independent with mobility/ambulation, transfers, ADL's, IADL's.   Zubrod Score: At the time of surgery this patient's most appropriate activity status/level should be described as: _0     0    Normal activity, no symptoms _1     1    Restricted in physical strenuous activity but ambulatory, able to do out light work _2     2  Ambulatory and capable of self care, unable to do work activities, up and about               >50 % of waking hours                              _0     3    Only limited self care, in bed greater than 50% of waking hours _1     4    Completely disabled, no self care, confined to bed or chair _2     5    Moribund   Past Medical History:  Diagnosis Date  . Anxiety   . Arthritis   . Asthma   . Cancer ([facility])    rectal, Lung  . Dyspnea    with exertion  . Emphysema of lung ([place])    patient denies  . History of kidney stones    passed  . HLD (hyperlipidemia)   . Pneumonia 02/2018  . Rectal tumor     Past  Surgical History:  Procedure Laterality Date  . ABLATION     endometrial  . BIOPSY  10/10/2018   Procedure: BIOPSY;  Surgeon: Rush Landmark Telford Nab., MD;  Location: [facility];  Service: Gastroenterology;;  . COLONOSCOPY    . DOPPLER ECHOCARDIOGRAPHY    . ENDOSCOPIC MUCOSAL RESECTION N/A 10/10/2018   Procedure: ENDOSCOPIC MUCOSAL RESECTION;  Surgeon: Rush Landmark Telford Nab., MD;  Location: [facility];  Service: Gastroenterology;  Laterality: N/A;  . EUS N/A 07/18/2018   Procedure: LOWER ENDOSCOPIC ULTRASOUND (EUS);  Surgeon: Irving Copas., MD;  Location: [facility];  Service: Gastroenterology;  Laterality: N/A;  . EUS N/A 10/10/2018   Procedure: LOWER ENDOSCOPIC ULTRASOUND (EUS);  Surgeon: Irving Copas., MD;  Location: [facility];  Service: Gastroenterology;  Laterality: N/A;  . FLEXIBLE SIGMOIDOSCOPY N/A 07/18/2018   Procedure: FLEXIBLE SIGMOIDOSCOPY;  Surgeon: Irving Copas., MD;  Location: [facility];  Service: Gastroenterology;  Laterality: N/A;  . FLEXIBLE SIGMOIDOSCOPY N/A 10/10/2018   Procedure: FLEXIBLE SIGMOIDOSCOPY;  Surgeon: Rush Landmark Telford Nab., MD;  Location: [facility];  Service: Gastroenterology;  Laterality: N/A;  . HEMOSTASIS CLIP PLACEMENT  10/10/2018   Procedure: HEMOSTASIS CLIP PLACEMENT;  Surgeon: Irving Copas., MD;  Location: [facility];  Service: Gastroenterology;;  . Lia Foyer LIFTING INJECTION  10/10/2018   Procedure: SUBMUCOSAL LIFTING INJECTION;  Surgeon: Irving Copas., MD;  Location: [facility];  Service: Gastroenterology;;  . TEE WITHOUT CARDIOVERSION  02/2018  . TONSILLECTOMY    . TUBAL LIGATION  1987  . VIDEO BRONCHOSCOPY N/A 10/12/2018   Procedure: VIDEO BRONCHOSCOPY;  Surgeon: Grace Isaac, MD;  Location: [facility];  Service: Thoracic;  Laterality: N/A;    Family History  Problem Relation Age of Onset  . Heart disease Mother   . COPD Mother   . Emphysema Mother   . Diabetes Father   .  Colon cancer Neg Hx   . Colon polyps Neg Hx   . Esophageal cancer Neg Hx   . Rectal cancer Neg Hx   . Stomach cancer Neg Hx    Father committed suicide at age 33 Mother died at age 19 of COPD complications One sister has thyroid disease Patient has 2 children who are healthy  Social History   Tobacco Use  Smoking Status Former Research scientist (life sciences)  . Years: 33.00  . Last attempt to quit: 2008  . Years since quitting: 12.3  Smokeless Tobacco Never Used  Social History   Substance and Sexual Activity  Alcohol Use Not Currently   Comment: occ- rare      Allergies  Allergen Reactions  . Hydrocodone Nausea Only    "terrible headache"  . Levaquin [Levofloxacin In D5w] Nausea And Vomiting       . Other     Dog Dander     Current Outpatient Medications  Medication Sig Dispense Refill  . albuterol (PROVENTIL HFA;VENTOLIN HFA) 108 (90 Base) MCG/ACT inhaler Inhale 2 puffs into the lungs every 6 (six) hours as needed for wheezing or shortness of breath.    Marland Kitchen atorvastatin (LIPITOR) 20 MG tablet Take 20 mg by mouth every evening.     . busPIRone (BUSPAR) 10 MG tablet Take 5 mg by mouth 2 (two) times daily.     . fluticasone (FLONASE) 50 MCG/ACT nasal spray Place 1 spray into both nostrils daily as needed for allergies or rhinitis.    Marland Kitchen HYDROcodone-acetaminophen (NORCO) 5-325 MG tablet Take 1 tablet by mouth every 6 (six) hours as needed for severe pain. 10 tablet 0  . ibuprofen (ADVIL,MOTRIN) 200 MG tablet Take 400-600 mg by mouth daily as needed for headache or moderate pain.    Marland Kitchen LORazepam (ATIVAN) 0.5 MG tablet Take 0.5 mg by mouth every evening.     . meloxicam (MOBIC) 15 MG tablet Take 15 mg by mouth as needed for pain.    . methocarbamol (ROBAXIN) 500 MG tablet Take 1 tablet (500 mg total) by mouth every 8 (eight) hours as needed for muscle spasms. (Patient not taking: Reported on 11/24/2018) 15 tablet 0  . predniSONE (STERAPRED UNI-PAK 21 TAB) 5 MG (21) TBPK tablet Take 5 mg by mouth  daily.     No current facility-administered medications for this visit.     Pertinent items are noted in HPI.   Review of Systems:     Cardiac Review of Systems: [Y] = yes  or   [ N ] = no   Chest Pain Aqua.Slicker ]  Resting SOB Aqua.Slicker ] Exertional SOB  [N]  Orthopnea Aqua.Slicker  ]   Pedal Edema [  N    Palpitations [N Syncope  [N]   Presyncope [ N]   General Review of Systems: [Y] = yes [  ]=no Constitional: recent weight change [  ];  Wt loss over the last 3 months [   ] anorexia [  ]; fatigue [  ]; nausea [  ]; night sweats [  ]; fever [  ]; or chills [  ];           Eye : blurred vision [  ]; diplopia [   ]; vision changes [  ];  Amaurosis fugax[  ]; Resp: cough [  ];  wheezing[  ];  hemoptysis[  ]; shortness of breath[  ]; paroxysmal nocturnal dyspnea[  ]; dyspnea on exertion[  ]; or orthopnea[  ];  GI:  gallstones[  ], vomiting[  ];  dysphagia[  ]; melena[  ];  hematochezia [  ]; heartburn[  ];   Hx of  Colonoscopy[ Y ]; GU: kidney stones [  ]; hematuria[  ];   dysuria [  ];  nocturia[  ];  history of     obstruction [  ]; urinary frequency [  ]             Skin: rash, swelling[  ];, hair loss[  ];  peripheral edema[  ];  or itching[  ];  Musculosketetal: myalgias[  ];  joint swelling[  ];  joint erythema[  ];  joint pain[  ];  back pain[  ];  Heme/Lymph: bruising[  ];  bleeding[  ];  anemia[  ];  Neuro: TIA[  ];  headaches[  ];  stroke[  ];  vertigo[  ];  seizures[  ];   paresthesias[  ];  difficulty walking[  ];  Psych:depression[  ]; anxiety[  ];  Endocrine: diabetes[  ];  thyroid dysfunction[  ];  Immunizations: Flu up to date Blue.Reese  ]; Pneumococcal up to date Florencio.Farrier  ];  Other:     PHYSICAL EXAMINATION: Virtual visit patient at home, I am in the office on the [place] with the patient   Diagnostic Studies & Laboratory data:     Recent Radiology Findings:  Ct Super D Chest Wo Contrast  Result Date: 10/04/2018 CLINICAL DATA:  Pulmonary nodule avid for neuroendocrine tumor PET imaging agent.  History of rectal carcinoma. EXAM: CT CHEST WITHOUT CONTRAST TECHNIQUE: Multidetector CT imaging of the chest was performed using thin slice collimation for electromagnetic bronchoscopy planning purposes, without intravenous contrast. COMPARISON:  No tape PET scan 08/17/2018 FINDINGS: Cardiovascular: No significant vascular findings. Normal heart size. No pericardial effusion. Mediastinum/Nodes: No axillary supraclavicular adenopathy. No mediastinal hilar adenopathy. No pericardial effusion. Esophagus normal. Lungs/Pleura: The small nodule identified on comparison DOTATATE PET scan localizes to a small endobronchial lesion at the bifurcation of the RIGHT middle lobe bronchi. This small ovoid lesion measures 8 mm (image 76/8 axial series; image 100/6/sagittal series, and image 80/4/coronal series) No additional pulmonary nodules. Upper Abdomen: Limited view of the liver, kidneys, pancreas are unremarkable. Normal adrenal glands. Musculoskeletal: No aggressive osseous lesion. IMPRESSION: 1. Small endobronchial lesion at the takeoff of the RIGHT middle lobe bronchus corresponds to the lesion of concern on comparison DOTATATE PET scan. 2. No lymphadenopathy. Electronically Signed   By: Suzy Bouchard M.D.   On: 10/04/2018 16:26    Nm Pet (netspot Ga 68 Dotatate) Skull Base To Mid Thigh  Result Date: 08/17/2018 CLINICAL DATA:  Carcinoma rectum identified on colonoscopy. Subsequent endoscopic mucosal resection. EXAM: NUCLEAR MEDICINE PET SKULL BASE TO THIGH TECHNIQUE: 5.1 mCi Ga 45 DOTATATE was injected intravenously. Full-ring PET imaging was performed from the skull base to thigh after the radiotracer. CT data was obtained and used for attenuation correction and anatomic localization. COMPARISON:  None. FINDINGS: NECK No radiotracer activity in neck lymph nodes. Incidental CT findings: None CHEST Within the RIGHT hilum, a discrete focus of radiotracer accumulation with SUV max equal 11.7. On close inspection there  is a small a parenchymal nodule measuring 6 mm (image 35/8) is immediately adjacent to the RIGHT lower lobe bronchus which corresponds the activity. No additional abnormal radiotracer in the thorax. No additional suspicious pulmonary nodules. Incidental CT finding:None ABDOMEN/PELVIS There is no activity within the rectum to suggest residual neuroendocrine tumor. Surgical clip noted distal rectum. There is no abnormal radiotracer accumulation within pelvic lymph nodes. No abdominal lymphadenopathy. No abnormal radiotracer accumulation within the liver. Physiologic activity noted in the adrenal glands, spleen and kidneys. Physiologic activity noted in the liver, spleen, adrenal glands and kidneys. Incidental CT findings:Lobular uterus consistent leiomyoma. SKELETON No focal activity to suggest skeletal metastasis. Incidental CT findings:None IMPRESSION: 1. No evidence residual neuroendocrine tumor within the rectum 2. No metastatic neuroendocrine tumor adenopathy in the abdomen or pelvis. 3. A single focus of intense uptake about the RIGHT hilum which localizes to a small pulmonary nodule. 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 Recommend follow-up CT in 3 to 6 months to demonstrate stability. I suspect this lesion would be difficult to resect. Electronically Signed   By: Suzy Bouchard M.D.   On: 08/17/2018 17:01     I have independently reviewed the above radiology studies  and reviewed the findings with the patient.   Recent Lab Findings: Lab Results  Component Value Date   WBC 14.9 (H) 10/12/2018   HGB 14.8 10/12/2018   HCT 47.3 (H) 10/12/2018   PLT 254 10/12/2018   GLUCOSE 90 10/12/2018   ALT 20 10/12/2018   AST 19 10/12/2018   NA 142 10/12/2018   K 3.8 10/12/2018   CL 108 10/12/2018   CREATININE 0.78 10/12/2018   BUN 13 10/12/2018   CO2 25 10/12/2018   INR 1.0 10/12/2018   sTRESS TEST 02/2108 Study Highlights    There was no ST segment deviation noted during stress.  Pt  walked for 9:00 of a Bruce protocol GXT. Peak HR of 153 which is 93% predicted max HR  There were no ST or T wave changes to suggest ischemia  Blood pressure demonstrated a hypertensive response to exercise.  No arrhythmias  This is interpreted as a negagative stress test. no evidence of ischemia .     Echo: Transthoracic Echocardiography  Patient:    Etienne, Millward MR #:       [id number] Study Date: 03/23/2018 Gender:     F Age:        63 Height:     166.4 cm Weight:     86.6 kg BSA:        2.03 m^2 Pt. Status: Room:   Jetty Duhamel, M.D.  REFERRING    Jenkins Rouge, M.D.  SONOGRAPHER  [place], Will  ATTENDING    Candee Furbish, M.D.  PERFORMING   [facility], Outpatient  cc:  ------------------------------------------------------------------- LV EF: 55% -   60%  ------------------------------------------------------------------- Indications:      (R07.9).  ------------------------------------------------------------------- History:   PMH:  Acquired from the patient and from the patient&'s chart.  Chest pain.  Dyspnea.  Risk factors:  Former tobacco use. Dyslipidemia.  ------------------------------------------------------------------- Study Conclusions  - Left ventricle: The cavity size was normal. Wall thickness was   normal. Systolic function was normal. The estimated ejection   fraction was in the range of 55% to 60%. Wall motion was normal;   there were no regional wall motion abnormalities. Doppler   parameters are consistent with abnormal left ventricular   relaxation (grade 1 diastolic dysfunction).  ------------------------------------------------------------------- Study data:  No prior study was available for comparison.  Study status:  Routine.  Procedure:  The patient reported no pain pre or post test. Transthoracic echocardiography for left ventricular function evaluation. Image quality was adequate.  Study completion:  There were  no complications.          Transthoracic echocardiography.  M-mode, complete 2D, spectral Doppler, and color Doppler.  Birthdate:  Patient birthdate: [birth year].  Age:  Patient is 58 yr old.  Sex:  Gender: female.    BMI: 31.3 kg/m^2.  Blood pressure:     132/82  Patient status:  Outpatient.  Study date: Study date: 03/23/2018. Study time: 03:52 PM.  Location:  [facility]  -------------------------------------------------------------------  ------------------------------------------------------------------- Left ventricle:  The cavity size was normal. Wall thickness was normal. Systolic function was normal. The estimated ejection fraction was in the range of 55% to 60%. Wall  motion was normal; there were no regional wall motion abnormalities. Doppler parameters are consistent with abnormal left ventricular relaxation (grade 1 diastolic dysfunction).  ------------------------------------------------------------------- Aortic valve:   Trileaflet; normal thickness leaflets. Mobility was not restricted.  Doppler:  Transvalvular velocity was within the normal range. There was no stenosis. There was no regurgitation.   ------------------------------------------------------------------- Aorta:  Aortic root: The aortic root was normal in size.  ------------------------------------------------------------------- Mitral valve:   Structurally normal valve.   Mobility was not restricted.  Doppler:  Transvalvular velocity was within the normal range. There was no evidence for stenosis. There was no regurgitation.    Valve area by pressure half-time: 2.27 cm^2. Indexed valve area by pressure half-time: 1.12 cm^2/m^2.    Peak gradient (D): 2 mm Hg.  ------------------------------------------------------------------- Left atrium:  The atrium was normal in size.  ------------------------------------------------------------------- Right ventricle:  The cavity size was normal. Wall  thickness was normal. Systolic function was normal.  ------------------------------------------------------------------- Pulmonic valve:    Doppler:  Transvalvular velocity was within the normal range. There was no evidence for stenosis.  ------------------------------------------------------------------- Tricuspid valve:   Structurally normal valve.    Doppler: Transvalvular velocity was within the normal range. There was no regurgitation.  ------------------------------------------------------------------- Pulmonary artery:   The main pulmonary artery was normal-sized. Systolic pressure was within the normal range.  ------------------------------------------------------------------- Right atrium:  The atrium was normal in size.  ------------------------------------------------------------------- Pericardium:  There was no pericardial effusion.  ------------------------------------------------------------------- Systemic veins: Inferior vena cava: The vessel was normal in size.  ------------------------------------------------------------------- Measurements   Left ventricle                         Value          Reference  LV ID, ED, PLAX chordal        (L)     41    mm       43 - 52  LV ID, ES, PLAX chordal                28    mm       23 - 38  LV fx shortening, PLAX chordal         32    %        >=29  LV PW thickness, ED                    11    mm       ----------  IVS/LV PW ratio, ED                    0.91           <=1.3  Stroke volume, 2D                      79    ml       ----------  Stroke volume/bsa, 2D                  39    ml/m^2   ----------  LV e&', lateral                         9.79  cm/s     ----------  LV E/e&', lateral                       7.63           ----------  LV e&', medial                          8.49  cm/s     ----------  LV E/e&', medial                        8.8            ----------  LV e&', average                         9.14   cm/s     ----------  LV E/e&', average                       8.17           ----------    Ventricular septum                     Value          Reference  IVS thickness, ED                      10    mm       ----------    LVOT                                   Value          Reference  LVOT ID, S                             20    mm       ----------  LVOT area                              3.14  cm^2     ----------  LVOT ID                                20    mm       ----------  LVOT peak velocity, S                  132   cm/s     ----------  LVOT mean velocity, S                  82.5  cm/s     ----------  LVOT VTI, S                            25.3  cm       ----------  LVOT peak gradient, S                  7     mm Hg    ----------  Stroke volume (SV), LVOT DP            79.5  ml       ----------  Stroke index (SV/bsa), LVOT DP         39.1  ml/m^2   ----------    Aorta  Value          Reference  Aortic root ID, ED                     29    mm       ----------  Ascending aorta ID, A-P, S             25    mm       ----------    Left atrium                            Value          Reference  LA ID, A-P, ES                         30    mm       ----------  LA ID/bsa, A-P                         1.48  cm/m^2   <=2.2  LA volume, S                           44.3  ml       ----------  LA volume/bsa, S                       21.8  ml/m^2   ----------  LA volume, ES, 1-p A4C                 46.2  ml       ----------  LA volume/bsa, ES, 1-p A4C             22.8  ml/m^2   ----------  LA volume, ES, 1-p A2C                 43.2  ml       ----------  LA volume/bsa, ES, 1-p A2C             21.3  ml/m^2   ----------    Mitral valve                           Value          Reference  Mitral E-wave peak velocity            74.7  cm/s     ----------  Mitral A-wave peak velocity            92.4  cm/s     ----------  Mitral deceleration time       (H)     330    ms       150 - 230  Mitral pressure half-time              97    ms       ----------  Mitral peak gradient, D                2     mm Hg    ----------  Mitral E/A ratio, peak                 0.8            ----------  Mitral valve area, PHT, DP  2.27  cm^2     ----------  Mitral valve area/bsa, PHT, DP         1.12  cm^2/m^2 ----------    Right atrium                           Value          Reference  RA ID, S-I, ES, A4C                    46.6  mm       34 - 49  RA area, ES, A4C                       12.1  cm^2     8.3 - 19.5  RA volume, ES, A/L                     25.8  ml       ----------  RA volume/bsa, ES, A/L                 12.7  ml/m^2   ----------    Systemic veins                         Value          Reference  Estimated CVP                          3     mm Hg    ----------    Right ventricle                        Value          Reference  TAPSE                                  19.1  mm       ----------  RV s&', lateral, S                      10.3  cm/s     ----------  Legend: (L)  and  (H)  mark values outside specified reference range.  ------------------------------------------------------------------- Prepared and Electronically Authenticated by  Candee Furbish, M.D. 2019-08-28T17:05:54  pft'S fev1 2.06 77% dlco 22.37 89% INTERPRETATION: Technique: Good technique. SPIROMETRY: Normal Airflows, no evidence of obstruction Bronchodilator response: No significant response to bronchodilator. DLCO: Normal diffusion capacity. LUNG VOLUMES: Lung volumes demonstrate air trapping  Assessment / Plan:    #1 patient with known rectal carcinoid, submucosal resection without evidence on PET scan of pelvic spread.  There was a single area of uptake in the right hilum questionably associated with a 6 mm lung nodule.   Bronchoscopy reveals a very broad-based external compression after the takeoff of the right middle lobe bronchus.  This area is not been  specifically biopsied but correlates with the area of significant uptake on that PET/netspot Ga 68 Dotatate)  scan. On the visit today I discussed with the patient proceeding with right video-assisted thoracoscopy and resection as this area in question appears to extensive to resect endoscopically and obtain adequate margins.  She has adequate pulmonary function studies and functional status to tolerate a right middle lobectomy.  She will  return to see [place] in the office in mid May and plan tentative surgery June 5.  Preop and postop information has been mailed to her which will be further discussed when she returns to the office.  The patient had her questions answered.   Grace Isaac MD      [address] [place] [address] Office [phone]   Beeper [phone]  11/24/2018 2:34 PM

## 2018-11-25 ENCOUNTER — Encounter: Payer: Self-pay | Admitting: *Deleted

## 2018-11-25 ENCOUNTER — Other Ambulatory Visit: Payer: Self-pay | Admitting: *Deleted

## 2018-11-25 DIAGNOSIS — D3A090 Benign carcinoid tumor of the bronchus and lung: Secondary | ICD-10-CM

## 2018-12-07 ENCOUNTER — Other Ambulatory Visit: Payer: Self-pay

## 2018-12-08 ENCOUNTER — Ambulatory Visit (INDEPENDENT_AMBULATORY_CARE_PROVIDER_SITE_OTHER): Payer: BC Managed Care – PPO | Admitting: Cardiothoracic Surgery

## 2018-12-08 ENCOUNTER — Encounter: Payer: Self-pay | Admitting: Cardiothoracic Surgery

## 2018-12-08 ENCOUNTER — Ambulatory Visit: Payer: BC Managed Care – PPO | Admitting: Cardiothoracic Surgery

## 2018-12-08 VITALS — BP 110/60 | HR 70 | Temp 97.3°F | Resp 16 | Ht 65.0 in | Wt 192.0 lb

## 2018-12-08 DIAGNOSIS — R911 Solitary pulmonary nodule: Secondary | ICD-10-CM

## 2018-12-08 DIAGNOSIS — D3A090 Benign carcinoid tumor of the bronchus and lung: Secondary | ICD-10-CM | POA: Diagnosis not present

## 2018-12-08 NOTE — Progress Notes (Signed)
[address]       ,[place] [address]             [phone]                Shannon [name] [place] Medical Record #[id number] Date of Birth: 1961/03/30  Referring: Irving Copas.* Primary Care: Rochel Brome, MD Primary Cardiologist: No primary care provider on file.  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  She was originally seen for abnormal PET/ Ga 68 DOTATATE scan , a follow-up CT of the chest to compare to the one she had in August 2019.  the patient had undergone her first routine screening colonoscopy and was found to have a  submucosal mass was found in the posterior rectum approximately 3 to 4 cm from the anal verge. The mass was circular and well-circumscribed with a small central umbilication. IIt diameter measured 12-15 mm.  EUS with submucosal resection was performed July 18, 2018. 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 - LYMPHOVASCULAR INVASION IS IDENTIFIED. - TUMOR IS FOCALLY PRESENT AT THE CAUTERIZED TISSUE EDGE Ki-67 is approximately 1% in the tumor cells   A PEPET/ Ga 68 DOTATATETscan was performed January 22 with a small focus of uptake in the right hilum.  Patient was referred to thoracic surgery for further recommendations.  Patient is a former smoker up to a pack a day for 20 years but quit has been smoke-free for the last 12 years.  She has a history of asthma as a child and currently uses inhalers as needed  Bronchoscopy was done with broad-based external compression of the right middle lobe bronchus.       Since last seen the patient has had no respiratory problems, she has not knowingly been exposed to or  developed COVID infection.  Current Activity/ Functional Status:  Patient is independent with mobility/ambulation, transfers, ADL's, IADL's.   Zubrod Score: At the time of surgery this patient's most appropriate activity status/level should be described as: '[x]'     0    Normal activity, no symptoms '[]'     1    Restricted in physical strenuous activity but ambulatory, able to do out light work '[]'     2    Ambulatory and capable of self care, unable to do work activities, up and about               >50 % of waking hours                              '[]'     3    Only limited self care, in bed greater than 50% of waking hours '[]'     4    Completely disabled, no self care, confined to bed or chair '[]'     5    Moribund   Past Medical History:  Diagnosis Date  . Anxiety   . Arthritis   . Asthma   . Cancer ([place])    rectal, Lung  . Dyspnea    with exertion  . Emphysema of lung ([place])    patient denies  . History of  kidney stones    passed  . HLD (hyperlipidemia)   . Pneumonia 02/2018  . Rectal tumor     Past Surgical History:  Procedure Laterality Date  . ABLATION     endometrial  . BIOPSY  10/10/2018   Procedure: BIOPSY;  Surgeon: Rush Landmark Telford Nab., MD;  Location: [facility];  Service: Gastroenterology;;  . COLONOSCOPY    . DOPPLER ECHOCARDIOGRAPHY    . ENDOSCOPIC MUCOSAL RESECTION N/A 10/10/2018   Procedure: ENDOSCOPIC MUCOSAL RESECTION;  Surgeon: Rush Landmark Telford Nab., MD;  Location: [facility];  Service: Gastroenterology;  Laterality: N/A;  . EUS N/A 07/18/2018   Procedure: LOWER ENDOSCOPIC ULTRASOUND (EUS);  Surgeon: Irving Copas., MD;  Location: [facility];  Service: Gastroenterology;  Laterality: N/A;  . EUS N/A 10/10/2018   Procedure: LOWER ENDOSCOPIC ULTRASOUND (EUS);  Surgeon: Irving Copas., MD;  Location: [facility];  Service: Gastroenterology;  Laterality: N/A;  . FLEXIBLE SIGMOIDOSCOPY N/A 07/18/2018   Procedure: FLEXIBLE SIGMOIDOSCOPY;   Surgeon: Irving Copas., MD;  Location: [facility];  Service: Gastroenterology;  Laterality: N/A;  . FLEXIBLE SIGMOIDOSCOPY N/A 10/10/2018   Procedure: FLEXIBLE SIGMOIDOSCOPY;  Surgeon: Rush Landmark Telford Nab., MD;  Location: [facility];  Service: Gastroenterology;  Laterality: N/A;  . HEMOSTASIS CLIP PLACEMENT  10/10/2018   Procedure: HEMOSTASIS CLIP PLACEMENT;  Surgeon: Irving Copas., MD;  Location: [facility];  Service: Gastroenterology;;  . Lia Foyer LIFTING INJECTION  10/10/2018   Procedure: SUBMUCOSAL LIFTING INJECTION;  Surgeon: Irving Copas., MD;  Location: [facility];  Service: Gastroenterology;;  . TEE WITHOUT CARDIOVERSION  02/2018  . TONSILLECTOMY    . TUBAL LIGATION  1987  . VIDEO BRONCHOSCOPY N/A 10/12/2018   Procedure: VIDEO BRONCHOSCOPY;  Surgeon: Grace Isaac, MD;  [facility];  Service: Thoracic;  Laterality: N/A;    Family History  Problem Relation Age of Onset  . Heart disease Mother   . COPD Mother   . Emphysema Mother   . Diabetes Father   . Colon cancer Neg Hx   . Colon polyps Neg Hx   . Esophageal cancer Neg Hx   . Rectal cancer Neg Hx   . Stomach cancer Neg Hx    Father committed suicide at age 72 Mother died at age 46 of COPD complications One sister has thyroid disease Patient has 2 children who are healthy  Social History   Tobacco Use  Smoking Status Former Research scientist (life sciences)  . Years: 33.00  . Last attempt to quit: 2008  . Years since quitting: 12.3  Smokeless Tobacco Never Used    Social History   Substance and Sexual Activity  Alcohol Use Not Currently   Comment: occ- rare      Allergies  Allergen Reactions  . Hydrocodone Nausea Only    "terrible headache"  . Levaquin [Levofloxacin In D5w] Nausea And Vomiting       . Other     Dog Dander     Current Outpatient Medications  Medication Sig Dispense Refill  . albuterol (PROVENTIL HFA;VENTOLIN HFA) 108 (90 Base) MCG/ACT inhaler Inhale 2 puffs into  the lungs every 6 (six) hours as needed for wheezing or shortness of breath.    Marland Kitchen atorvastatin (LIPITOR) 20 MG tablet Take 20 mg by mouth every evening.     . busPIRone (BUSPAR) 10 MG tablet Take 5 mg by mouth 2 (two) times daily.     . fluticasone (FLONASE) 50 MCG/ACT nasal spray Place 1 spray into both nostrils daily as needed for allergies or  rhinitis.    Marland Kitchen HYDROcodone-acetaminophen (NORCO) 5-325 MG tablet Take 1 tablet by mouth every 6 (six) hours as needed for severe pain. 10 tablet 0  . ibuprofen (ADVIL,MOTRIN) 200 MG tablet Take 400-600 mg by mouth daily as needed for headache or moderate pain.    Marland Kitchen LORazepam (ATIVAN) 0.5 MG tablet Take 0.5 mg by mouth every evening.     . meloxicam (MOBIC) 15 MG tablet Take 15 mg by mouth as needed for pain.    . methocarbamol (ROBAXIN) 500 MG tablet Take 1 tablet (500 mg total) by mouth every 8 (eight) hours as needed for muscle spasms. (Patient not taking: Reported on 11/24/2018) 15 tablet 0  . predniSONE (STERAPRED UNI-PAK 21 TAB) 5 MG (21) TBPK tablet Take 5 mg by mouth daily.     No current facility-administered medications for this visit.     Pertinent items are noted in HPI.   Review of Systems:     Cardiac Review of Systems: [Y] = yes  or   [ N ] = no   Chest Pain [n]  Resting SOB [n ] Exertional SOB  [n]  Orthopnea [n  ]   Pedal Edema [  n    Palpitations [nSyncope  [n   Presyncope [ n   General Review of Systems: [Y] = yes [  ]=no Constitional: recent weight change [  ];  Wt loss over the last 3 months [   ] anorexia [  ]; fatigue [  ]; nausea [  ]; night sweats [  ]; fever [  ]; or chills [  ];           Eye : blurred vision [  ]; diplopia [   ]; vision changes [  ];  Amaurosis fugax[  ]; Resp: cough [  ];  wheezing[  ];  hemoptysis[  ]; shortness of breath[  ]; paroxysmal nocturnal dyspnea[  ]; dyspnea on exertion[  ]; or orthopnea[  ];  GI:  gallstones[  ], vomiting[  ];  dysphagia[  ]; melena[  ];  hematochezia [  ]; heartburn[  ];   Hx  of  Colonoscopy[ Y ]; GU: kidney stones [  ]; hematuria[  ];   dysuria [  ];  nocturia[  ];  history of     obstruction [  ]; urinary frequency [  ]             Skin: rash, swelling[  ];, hair loss[  ];  peripheral edema[  ];  or itching[  ]; Musculosketetal: myalgias[  ];  joint swelling[  ];  joint erythema[  ];  joint pain[  ];  back pain[  ];  Heme/Lymph: bruising[  ];  bleeding[  ];  anemia[  ];  Neuro: TIA[  ];  headaches[  ];  stroke[  ];  vertigo[  ];  seizures[  ];   paresthesias[  ];  difficulty walking[  ];  Psych:depression[  ]; anxiety[  ];  Endocrine: diabetes[  ];  thyroid dysfunction[  ];  Immunizations: Flu up to date Blue.Reese  ]; Pneumococcal up to date Florencio.Farrier  ];  Other:     PHYSICAL EXAMINATION: BP 110/60 (BP Location: Right Arm, Patient Position: Sitting, Cuff Size: Large)   Pulse 70   Temp (!) 97.3 F (36.3 C) (Skin)   Resp 16   Ht '5\' 5"'  (1.651 m)   Wt 192 lb (87.1 kg)   SpO2 96% Comment: ON RA  BMI  31.95 kg/m  General appearance: alert, cooperative, appears stated age and no distress Head: Normocephalic, without obvious abnormality, atraumatic Lymph nodes: Cervical, supraclavicular, and axillary nodes normal. Resp: clear to auscultation bilaterally Cardio: regular rate and rhythm, S1, S2 normal, no murmur, click, rub or gallop GI: soft, non-tender; bowel sounds normal; no masses,  no organomegaly Extremities: extremities normal, atraumatic, no cyanosis or edema and Homans sign is negative, no sign of DVT Neurologic: Grossly normal  Diagnostic Studies & Laboratory data:     Recent Radiology Findings:  Ct Super D Chest Wo Contrast  Result Date: 10/04/2018 CLINICAL DATA:  Pulmonary nodule avid for neuroendocrine tumor PET imaging agent. History of rectal carcinoma. EXAM: CT CHEST WITHOUT CONTRAST TECHNIQUE: Multidetector CT imaging of the chest was performed using thin slice collimation for electromagnetic bronchoscopy planning purposes, without intravenous contrast.  COMPARISON:  No tape PET scan 08/17/2018 FINDINGS: Cardiovascular: No significant vascular findings. Normal heart size. No pericardial effusion. Mediastinum/Nodes: No axillary supraclavicular adenopathy. No mediastinal hilar adenopathy. No pericardial effusion. Esophagus normal. Lungs/Pleura: The small nodule identified on comparison DOTATATE PET scan localizes to a small endobronchial lesion at the bifurcation of the RIGHT middle lobe bronchi. This small ovoid lesion measures 8 mm (image 76/8 axial series; image 100/6/sagittal series, and image 80/4/coronal series) No additional pulmonary nodules. Upper Abdomen: Limited view of the liver, kidneys, pancreas are unremarkable. Normal adrenal glands. Musculoskeletal: No aggressive osseous lesion. IMPRESSION: 1. Small endobronchial lesion at the takeoff of the RIGHT middle lobe bronchus corresponds to the lesion of concern on comparison DOTATATE PET scan. 2. No lymphadenopathy. Electronically Signed   By: Suzy Bouchard M.D.   On: 10/04/2018 16:26    Nm Pet (netspot Ga 68 Dotatate) Skull Base To Mid Thigh  Result Date: 08/17/2018 CLINICAL DATA:  Carcinoma rectum identified on colonoscopy. Subsequent endoscopic mucosal resection. EXAM: NUCLEAR MEDICINE PET SKULL BASE TO THIGH TECHNIQUE: 5.1 mCi Ga 71 DOTATATE was injected intravenously. Full-ring PET imaging was performed from the skull base to thigh after the radiotracer. CT data was obtained and used for attenuation correction and anatomic localization. COMPARISON:  None. FINDINGS: NECK No radiotracer activity in neck lymph nodes. Incidental CT findings: None CHEST Within the RIGHT hilum, a discrete focus of radiotracer accumulation with SUV max equal 11.7. On close inspection there is a small a parenchymal nodule measuring 6 mm (image 35/8) is immediately adjacent to the RIGHT lower lobe bronchus which corresponds the activity. No additional abnormal radiotracer in the thorax. No additional suspicious pulmonary  nodules. Incidental CT finding:None ABDOMEN/PELVIS There is no activity within the rectum to suggest residual neuroendocrine tumor. Surgical clip noted distal rectum. There is no abnormal radiotracer accumulation within pelvic lymph nodes. No abdominal lymphadenopathy. No abnormal radiotracer accumulation within the liver. Physiologic activity noted in the adrenal glands, spleen and kidneys. Physiologic activity noted in the liver, spleen, adrenal glands and kidneys. Incidental CT findings:Lobular uterus consistent leiomyoma. SKELETON No focal activity to suggest skeletal metastasis. Incidental CT findings:None IMPRESSION: 1. No evidence residual neuroendocrine tumor within the rectum 2. No metastatic neuroendocrine tumor adenopathy in the abdomen or pelvis. 3. A single focus of intense uptake about the RIGHT hilum which localizes to a small pulmonary nodule. 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 Recommend follow-up CT in 3 to 6 months to demonstrate stability. I suspect this lesion would be difficult to resect. Electronically Signed   By: Suzy Bouchard M.D.   On: 08/17/2018 17:01     I have independently reviewed the above  radiology studies  and reviewed the findings with the patient.   Recent Lab Findings: Lab Results  Component Value Date   WBC 14.9 (H) 10/12/2018   HGB 14.8 10/12/2018   HCT 47.3 (H) 10/12/2018   PLT 254 10/12/2018   GLUCOSE 90 10/12/2018   ALT 20 10/12/2018   AST 19 10/12/2018   NA 142 10/12/2018   K 3.8 10/12/2018   CL 108 10/12/2018   CREATININE 0.78 10/12/2018   BUN 13 10/12/2018   CO2 25 10/12/2018   INR 1.0 10/12/2018   sTRESS TEST 02/2108 Study Highlights    There was no ST segment deviation noted during stress.  Pt walked for 9:00 of a Bruce protocol GXT. Peak HR of 153 which is 93% predicted max HR  There were no ST or T wave changes to suggest ischemia  Blood pressure demonstrated a hypertensive response to exercise.  No arrhythmias   This is interpreted as a negagative stress test. no evidence of ischemia .     Echo: Transthoracic Echocardiography  Patient:    Alejandrina, Raimer MR #:       [id number] Study Date: 03/23/2018 Gender:     F Age:        66 Height:     166.4 cm Weight:     86.6 kg BSA:        2.03 m^2 Pt. Status: Room:   Jetty Duhamel, M.D.  REFERRING    Jenkins Rouge, M.D.  SONOGRAPHER  [place], [place]  ATTENDING    Candee Furbish, M.D.  PERFORMING   [facility], Outpatient  cc:  ------------------------------------------------------------------- LV EF: 55% -   60%  ------------------------------------------------------------------- Indications:      (R07.9).  ------------------------------------------------------------------- History:   PMH:  Acquired from the patient and from the patient&'s chart.  Chest pain.  Dyspnea.  Risk factors:  Former tobacco use. Dyslipidemia.  ------------------------------------------------------------------- Study Conclusions  - Left ventricle: The cavity size was normal. Wall thickness was   normal. Systolic function was normal. The estimated ejection   fraction was in the range of 55% to 60%. Wall motion was normal;   there were no regional wall motion abnormalities. Doppler   parameters are consistent with abnormal left ventricular   relaxation (grade 1 diastolic dysfunction).  ------------------------------------------------------------------- Study data:  No prior study was available for comparison.  Study status:  Routine.  Procedure:  The patient reported no pain pre or post test. Transthoracic echocardiography for left ventricular function evaluation. Image quality was adequate.  Study completion:  There were no complications.          Transthoracic echocardiography.  M-mode, complete 2D, spectral Doppler, and color Doppler.  Birthdate:  Patient birthdate: 10-May-1961.  Age:  Patient is 58 yr old.  Sex:  Gender: female.    BMI: 31.3  kg/m^2.  Blood pressure:     132/82  Patient status:  Outpatient.  Study date: Study date: 03/23/2018. Study time: 03:52 PM.  Location:  [facility]  -------------------------------------------------------------------  ------------------------------------------------------------------- Left ventricle:  The cavity size was normal. Wall thickness was normal. Systolic function was normal. The estimated ejection fraction was in the range of 55% to 60%. Wall motion was normal; there were no regional wall motion abnormalities. Doppler parameters are consistent with abnormal left ventricular relaxation (grade 1 diastolic dysfunction).  ------------------------------------------------------------------- Aortic valve:   Trileaflet; normal thickness leaflets. Mobility was not restricted.  Doppler:  Transvalvular velocity was within the normal range. There was no  stenosis. There was no regurgitation.   ------------------------------------------------------------------- Aorta:  Aortic root: The aortic root was normal in size.  ------------------------------------------------------------------- Mitral valve:   Structurally normal valve.   Mobility was not restricted.  Doppler:  Transvalvular velocity was within the normal range. There was no evidence for stenosis. There was no regurgitation.    Valve area by pressure half-time: 2.27 cm^2. Indexed valve area by pressure half-time: 1.12 cm^2/m^2.    Peak gradient (D): 2 mm Hg.  ------------------------------------------------------------------- Left atrium:  The atrium was normal in size.  ------------------------------------------------------------------- Right ventricle:  The cavity size was normal. Wall thickness was normal. Systolic function was normal.  ------------------------------------------------------------------- Pulmonic valve:    Doppler:  Transvalvular velocity was within the normal range. There was no evidence  for stenosis.  ------------------------------------------------------------------- Tricuspid valve:   Structurally normal valve.    Doppler: Transvalvular velocity was within the normal range. There was no regurgitation.  ------------------------------------------------------------------- Pulmonary artery:   The main pulmonary artery was normal-sized. Systolic pressure was within the normal range.  ------------------------------------------------------------------- Right atrium:  The atrium was normal in size.  ------------------------------------------------------------------- Pericardium:  There was no pericardial effusion.  ------------------------------------------------------------------- Systemic veins: Inferior vena cava: The vessel was normal in size.  ------------------------------------------------------------------- Measurements   Left ventricle                         Value          Reference  LV ID, ED, PLAX chordal        (L)     41    mm       43 - 52  LV ID, ES, PLAX chordal                28    mm       23 - 38  LV fx shortening, PLAX chordal         32    %        >=29  LV PW thickness, ED                    11    mm       ----------  IVS/LV PW ratio, ED                    0.91           <=1.3  Stroke volume, 2D                      79    ml       ----------  Stroke volume/bsa, 2D                  39    ml/m^2   ----------  LV e&', lateral                         9.79  cm/s     ----------  LV E/e&', lateral                       7.63           ----------  LV e&', medial                          8.49  cm/s     ----------  LV E/e&', medial  8.8            ----------  LV e&', average                         9.14  cm/s     ----------  LV E/e&', average                       8.17           ----------    Ventricular septum                     Value          Reference  IVS thickness, ED                      10    mm       ----------     LVOT                                   Value          Reference  LVOT ID, S                             20    mm       ----------  LVOT area                              3.14  cm^2     ----------  LVOT ID                                20    mm       ----------  LVOT peak velocity, S                  132   cm/s     ----------  LVOT mean velocity, S                  82.5  cm/s     ----------  LVOT VTI, S                            25.3  cm       ----------  LVOT peak gradient, S                  7     mm Hg    ----------  Stroke volume (SV), LVOT DP            79.5  ml       ----------  Stroke index (SV/bsa), LVOT DP         39.1  ml/m^2   ----------    Aorta                                  Value          Reference  Aortic root ID, ED                     29    mm       ----------  Ascending aorta ID, A-P, S             25    mm       ----------    Left atrium                            Value          Reference  LA ID, A-P, ES                         30    mm       ----------  LA ID/bsa, A-P                         1.48  cm/m^2   <=2.2  LA volume, S                           44.3  ml       ----------  LA volume/bsa, S                       21.8  ml/m^2   ----------  LA volume, ES, 1-p A4C                 46.2  ml       ----------  LA volume/bsa, ES, 1-p A4C             22.8  ml/m^2   ----------  LA volume, ES, 1-p A2C                 43.2  ml       ----------  LA volume/bsa, ES, 1-p A2C             21.3  ml/m^2   ----------    Mitral valve                           Value          Reference  Mitral E-wave peak velocity            74.7  cm/s     ----------  Mitral A-wave peak velocity            92.4  cm/s     ----------  Mitral deceleration time       (H)     330   ms       150 - 230  Mitral pressure half-time              97    ms       ----------  Mitral peak gradient, D                2     mm Hg    ----------  Mitral E/A ratio, peak                 0.8            ----------  Mitral  valve area, PHT, DP             2.27  cm^2     ----------  Mitral valve area/bsa, PHT, DP         1.12  cm^2/m^2 ----------    Right atrium  Value          Reference  RA ID, S-I, ES, A4C                    46.6  mm       34 - 49  RA area, ES, A4C                       12.1  cm^2     8.3 - 19.5  RA volume, ES, A/L                     25.8  ml       ----------  RA volume/bsa, ES, A/L                 12.7  ml/m^2   ----------    Systemic veins                         Value          Reference  Estimated CVP                          3     mm Hg    ----------    Right ventricle                        Value          Reference  TAPSE                                  19.1  mm       ----------  RV s&', lateral, S                      10.3  cm/s     ----------  Legend: (L)  and  (H)  mark values outside specified reference range.  ------------------------------------------------------------------- Prepared and Electronically Authenticated by  Candee Furbish, M.D. 2019-08-28T17:05:54  pft'S fev1 2.06 77% dlco 22.37 89% INTERPRETATION: Technique: Good technique. SPIROMETRY: Normal Airflows, no evidence of obstruction Bronchodilator response: No significant response to bronchodilator. DLCO: Normal diffusion capacity. LUNG VOLUMES: Lung volumes demonstrate air trapping  Assessment / Plan:    #1 patient with known rectal carcinoid, submucosal resection without evidence on PET scan of pelvic spread.  There was a single area of uptake in the right hilum questionably associated with a 6 mm lung nodule.   Bronchoscopy reveals a very broad-based external compression after the takeoff of the right middle lobe bronchus.  This area is not been specifically biopsied but correlates with the area of significant uptake on that PET/netspot Ga 68 Dotatate)  Scan.  She has adequate pulmonary function studies and functional status to tolerate a right middle lobectomy.   plan   surgery June 5.    I discussed with her in detail and a surgical plan, and the need for right middle lobectomy and able to to adequately resect the radiographic evidence of carcinoid tumor.  Risks of surgery were discussed in detail, also the possible need for further lung resection to obtain adequate margin.  She is willing to proceed we have discussed the need to stay quarantined to cut down on the risk of preoperative COVID exposure.    Grace Isaac MD  [address] [place] [address] Office [phone]   Beeper [phone]  12/08/2018 9:15 AM

## 2018-12-26 NOTE — Pre-Procedure Instructions (Signed)
JULYA ALIOTO  12/26/2018      [id number] - [place], [place] [address] Phone: [phone] Fax: [phone]  Your procedure is scheduled on Friday 12-30-18 from [id number] Report to [place], Entrance A at [place]   Call this number if you have problems the morning of surgery: [phone]   Remember:  Do not eat or drink after midnight.     Take these medicines the morning of surgery with A SIP OF WATER : busPIRone (BUSPAR)  As needed: albuterol (PROVENTIL HFA;VENTOLIN HFA) fluticasone (FLONASE)  As of today STOP taking MOBIC, any Aspirin (unless otherwise instructed by your surgeon), Aleve, Naproxen, Ibuprofen, Motrin, Advil, Goody's, BC's, all herbal medications, fish oil, and all vitamins.    Do not wear jewelry, make-up or nail polish.  Do not wear lotions, powders, or perfumes, or deodorant.  Do not shave 48 hours prior to surgery.  Men may shave face and neck.  Do not bring valuables to the hospital.  [facility] is not responsible for any belongings or valuables.  Contacts, dentures or bridgework may not be worn into surgery.  Leave your suitcase in the car.  After surgery it may be brought to your room.  For patients admitted to the hospital, discharge time will be determined by your treatment team.  Patients discharged the day of surgery will not be allowed to drive home.   Special instructions:  [place]- Preparing For Surgery  Before surgery, you can play an important role. Because skin is not sterile, your skin needs to be as free of germs as possible. You can reduce the number of germs on your skin by washing with CHG (chlorahexidine gluconate) Soap before surgery.  CHG is an antiseptic cleaner which kills germs and bonds with the skin to continue killing germs even after washing.    Oral Hygiene is also important to reduce your risk of infection.  Remember - BRUSH YOUR TEETH  THE MORNING OF SURGERY WITH YOUR REGULAR TOOTHPASTE  Please do not use if you have an allergy to CHG or antibacterial soaps. If your skin becomes reddened/irritated stop using the CHG.  Do not shave (including legs and underarms) for at least 48 hours prior to first CHG shower. It is OK to shave your face.  Please follow these instructions carefully.   1. Shower the NIGHT BEFORE SURGERY and the MORNING OF SURGERY with CHG.   2. If you chose to wash your hair, wash your hair first as usual with your normal shampoo.  3. After you shampoo, rinse your hair and body thoroughly to remove the shampoo.  4. Use CHG as you would any other liquid soap. You can apply CHG directly to the skin and wash gently with a scrungie or a clean washcloth.   5. Apply the CHG Soap to your body ONLY FROM THE NECK DOWN.  Do not use on open wounds or open sores. Avoid contact with your eyes, ears, mouth and genitals (private parts). Wash Face and genitals (private parts)  with your normal soap.  6. Wash thoroughly, paying special attention to the area where your surgery will be performed.  7. Thoroughly rinse your body with warm water from the neck down.  8. DO NOT shower/wash with your normal soap after using and rinsing off the CHG Soap.  9. Pat yourself dry with a CLEAN TOWEL.  10. Wear CLEAN PAJAMAS to bed the night before surgery,  wear comfortable clothes the morning of surgery  11. Place CLEAN SHEETS on your bed the night of your first shower and DO NOT SLEEP WITH PETS.  Day of Surgery:  Do not apply any deodorants/lotions.  Please wear clean clothes to the hospital/surgery center.   Remember to brush your teeth WITH YOUR REGULAR TOOTHPASTE.  Please read over the following fact sheets that you were given. Pain Booklet, Coughing and Deep Breathing, MRSA Information and Surgical Site Infection Prevention

## 2018-12-27 ENCOUNTER — Other Ambulatory Visit (HOSPITAL_COMMUNITY)
Admission: RE | Admit: 2018-12-27 | Discharge: 2018-12-27 | Disposition: A | Discharge status: Home / Self Care | Payer: BC Managed Care – PPO | Source: Ambulatory Visit | Attending: Cardiothoracic Surgery | Admitting: Cardiothoracic Surgery

## 2018-12-27 ENCOUNTER — Encounter (HOSPITAL_COMMUNITY): Payer: Self-pay

## 2018-12-27 ENCOUNTER — Other Ambulatory Visit: Payer: Self-pay

## 2018-12-27 ENCOUNTER — Encounter (HOSPITAL_COMMUNITY)
Admission: RE | Admit: 2018-12-27 | Discharge: 2018-12-27 | Disposition: A | Discharge status: Home / Self Care | Payer: BC Managed Care – PPO | Source: Ambulatory Visit | Attending: Cardiothoracic Surgery | Admitting: Cardiothoracic Surgery

## 2018-12-27 ENCOUNTER — Ambulatory Visit (HOSPITAL_COMMUNITY)
Admission: RE | Admit: 2018-12-27 | Discharge: 2018-12-27 | Disposition: A | Discharge status: Home / Self Care | Payer: BC Managed Care – PPO | Source: Ambulatory Visit | Attending: Cardiothoracic Surgery | Admitting: Cardiothoracic Surgery

## 2018-12-27 DIAGNOSIS — D3A090 Benign carcinoid tumor of the bronchus and lung: Secondary | ICD-10-CM

## 2018-12-27 DIAGNOSIS — Z1159 Encounter for screening for other viral diseases: Secondary | ICD-10-CM | POA: Insufficient documentation

## 2018-12-27 LAB — CBC
HCT: 44.5 % (ref 36.0–46.0)
Hemoglobin: 14.3 g/dL (ref 12.0–15.0)
MCH: 28 pg (ref 26.0–34.0)
MCHC: 32.1 g/dL (ref 30.0–36.0)
MCV: 87.3 fL (ref 80.0–100.0)
Platelets: 218 10*3/uL (ref 150–400)
RBC: 5.1 MIL/uL (ref 3.87–5.11)
RDW: 13.1 % (ref 11.5–15.5)
WBC: 5.1 10*3/uL (ref 4.0–10.5)
nRBC: 0 % (ref 0.0–0.2)

## 2018-12-27 LAB — URINALYSIS, ROUTINE W REFLEX MICROSCOPIC
Bilirubin Urine: NEGATIVE
Glucose, UA: NEGATIVE mg/dL
Hgb urine dipstick: NEGATIVE
Ketones, ur: NEGATIVE mg/dL
Leukocytes,Ua: NEGATIVE
Nitrite: NEGATIVE
Protein, ur: NEGATIVE mg/dL
Specific Gravity, Urine: 1.019 (ref 1.005–1.030)
pH: 6 (ref 5.0–8.0)

## 2018-12-27 LAB — COMPREHENSIVE METABOLIC PANEL
ALT: 23 U/L (ref 0–44)
AST: 24 U/L (ref 15–41)
Albumin: 4 g/dL (ref 3.5–5.0)
Alkaline Phosphatase: 67 U/L (ref 38–126)
Anion gap: 9 (ref 5–15)
BUN: 14 mg/dL (ref 6–20)
CO2: 22 mmol/L (ref 22–32)
Calcium: 9.9 mg/dL (ref 8.9–10.3)
Chloride: 109 mmol/L (ref 98–111)
Creatinine, Ser: 0.77 mg/dL (ref 0.44–1.00)
GFR calc Af Amer: >60 mL/min (ref >60)
GFR calc non Af Amer: >60 mL/min (ref >60)
Glucose, Bld: 88 mg/dL (ref 70–99)
Potassium: 4.2 mmol/L (ref 3.5–5.1)
Sodium: 140 mmol/L (ref 135–145)
Total Bilirubin: 1.1 mg/dL (ref 0.3–1.2)
Total Protein: 6.4 g/dL — ABNORMAL LOW (ref 6.5–8.1)

## 2018-12-27 LAB — TYPE AND SCREEN
ABO/RH(D): O POS
Antibody Screen: NEGATIVE

## 2018-12-27 LAB — SURGICAL PCR SCREEN
MRSA, PCR: NEGATIVE
Staphylococcus aureus: NEGATIVE

## 2018-12-27 LAB — BLOOD GAS, ARTERIAL
Acid-Base Excess: 1.5 mmol/L (ref 0.0–2.0)
Bicarbonate: 25.5 mmol/L (ref 20.0–28.0)
Drawn by: 42180
O2 Saturation: 97.6 %
Patient temperature: 98.6
pCO2 arterial: 40.4 mmHg (ref 32.0–48.0)
pH, Arterial: 7.417 (ref 7.350–7.450)
pO2, Arterial: 95.5 mmHg (ref 83.0–108.0)

## 2018-12-27 LAB — APTT: aPTT: 31 seconds (ref 24–36)

## 2018-12-27 LAB — PROTIME-INR
INR: 1.1 (ref 0.8–1.2)
Prothrombin Time: 13.9 seconds (ref 11.4–15.2)

## 2018-12-27 LAB — ABO/RH: ABO/RH(D): O POS

## 2018-12-27 NOTE — Progress Notes (Signed)
PCP - Dirk Dress, MD Cardiologist - Dr. Johnsie Cancel  Chest x-ray - 12/27/18 EKG - 12/27/18 Stress Test -03/23/18  ECHO - 03/23/18  Cardiac Cath - denies  Sleep Study - denies CPAP - denies  Blood Thinner Instructions:N/A Aspirin Instructions:N/A  Anesthesia review: Yes  Patient denies shortness of breath, fever, cough and chest pain at PAT appointment   Patient verbalized understanding of instructions that were given to them at the PAT appointment. Patient was also instructed that they will need to review over the PAT instructions again at home before surgery.   Pt to go for covid-19 testing following PAT appointment.    Coronavirus Screening  Have you experienced the following symptoms:  Cough yes/no: No Fever (>100.47F)  yes/no: No Runny nose yes/no: No Sore throat yes/no: No Difficulty breathing/shortness of breath  yes/no: No  Have you or a family member traveled in the last 14 days and where? yes/no: No   If the patient indicates "YES" to the above questions, their PAT will be rescheduled to limit the exposure to others and, the surgeon will be notified. THE PATIENT WILL NEED TO BE ASYMPTOMATIC FOR 14 DAYS.   If the patient is not experiencing any of these symptoms, the PAT nurse will instruct them to NOT bring anyone with them to their appointment since they may have these symptoms or traveled as well.   Please remind your patients and families that hospital visitation restrictions are in effect and the importance of the restrictions.

## 2018-12-28 LAB — NOVEL CORONAVIRUS, NAA (HOSP ORDER, SEND-OUT TO REF LAB; TAT 18-24 HRS): SARS-CoV-2, NAA: NOT DETECTED

## 2018-12-28 NOTE — Progress Notes (Signed)
Anesthesia Chart Review:  Case:  [id number] Date/Time:  12/30/18 0715   Procedures:      VIDEO BRONCHOSCOPY (N/A )     VIDEO ASSISTED THORACOSCOPY (VATS)/LUNG RESECTION (Right Chest)   Anesthesia type:  General   Pre-op diagnosis:  CARCINOID TUMOR   Location:  [facility] OR ROOM 14 / [facility]   Surgeon:  Grace Isaac, MD      DISCUSSION: Patient is a 58 year old female scheduled for the above procedure.  History includes former smoker (quit 2008), HLD, asthma, exertional dyspnea, cancer (lung, rectal). 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  S/p EUS with submucosal resection of rectal mass 07/18/18 with rectal biopsy on 10/10/18 showing no residual tumor. PET showed a small nodule in the right hilum, so GI referred patient to CT surgery.  Dr. Servando Snare classified her Zubrod Score as 0: normal activity, no symptoms. Unremarkable ETT and echo in 2019.  12/27/18 presurgical COVID test negative. Labs acceptable.  If no acute changes and I would anticipate that she can proceed as planned.   VS: BP (!) 134/54   Pulse 62   Temp 36.4 C   Resp 18   Ht 5' 5.5" (1.664 m)   Wt 85.7 kg   SpO2 100%   BMI 30.96 kg/m    PROVIDERS: CoxElnita Maxwell, MD is PCP Zara Council, Do is pulmonologist  Mansouraty, Valarie Merino, MD is GI Jenkins Rouge, MD is cardiologist. 03/14/18 for chest pressure and anxiety with coronary calcium on chest CT. PRN follow-up recommended after normal ETT.   LABS: Labs reviewed: Acceptable for surgery. (all labs ordered are listed, but only abnormal results are displayed)  Labs Reviewed  COMPREHENSIVE METABOLIC PANEL - Abnormal; Notable for the following components:      Result Value   Total Protein 6.4 (*)    All other components within normal limits  URINALYSIS, ROUTINE W REFLEX MICROSCOPIC - Abnormal; Notable for the following  components:   Color, Urine AMBER (*)    APPearance HAZY (*)    All other components within normal limits  SURGICAL PCR SCREEN  APTT  BLOOD GAS, ARTERIAL  CBC  PROTIME-INR  TYPE AND SCREEN  ABO/RH    PFTs 06/01/18: FVC 2.83 (82%), post 3.05 (88%). FEV1 2.07 (77%), post 2.17 (80%). DLCO unc 22.37 (89%), cor 21.79 (87%).    IMAGES: CXR 12/27/18: IMPRESSION: No acute cardiopulmonary disease. Pulmonary nodule in the right hilum noted on prior recent CT and PET-CT is best identified by the studies.  CT Super D Chest 10/04/18: IMPRESSION: 1. Small endobronchial lesion at the takeoff of the RIGHT middle lobe bronchus corresponds to the lesion of concern on comparison DOTATATE PET scan. 2. No lymphadenopathy.  PET Scan 08/17/18: IMPRESSION: 1. No evidence residual neuroendocrine tumor within the rectum 2. No metastatic neuroendocrine tumor adenopathy in the abdomen or pelvis. 3. A single focus of intense uptake about the RIGHT hilum which localizes to a small pulmonary nodule. 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 Recommend follow-up CT in 3 to 6 months to demonstrate stability. I suspect this lesion would be difficult to resect.   EKG: 12/27/18: SB at 55 BPM   CV: ETT 03/23/18:  There was no ST segment deviation noted during stress.  Pt walked for 9:00 of a Bruce protocol GXT. Peak HR of 153 which is 93% predicted max HR  There were no ST or T wave  changes to suggest ischemia  Blood pressure demonstrated a hypertensive response to exercise.  No arrhythmias  This is interpreted as a negagative stress test. no evidence of ischemia .  Echo 03/23/18: Study Conclusions - Left ventricle: The cavity size was normal. Wall thickness was   normal. Systolic function was normal. The estimated ejection   fraction was in the range of 55% to 60%. Wall motion was normal;   there were no regional wall motion abnormalities. Doppler   parameters are consistent with abnormal  left ventricular   relaxation (grade 1 diastolic dysfunction).   Past Medical History:  Diagnosis Date  . Anxiety   . Arthritis   . Asthma   . Cancer ([place])    rectal, Lung  . Dyspnea    with exertion  . Emphysema of lung ([place])    patient denies  . History of kidney stones    passed  . HLD (hyperlipidemia)   . Pneumonia 02/2018  . Rectal tumor     Past Surgical History:  Procedure Laterality Date  . ABLATION     endometrial  . BIOPSY  10/10/2018   Procedure: BIOPSY;  Surgeon: Rush Landmark Telford Nab., MD;  Location: [facility];  Service: Gastroenterology;;  . COLONOSCOPY    . DOPPLER ECHOCARDIOGRAPHY    . ENDOSCOPIC MUCOSAL RESECTION N/A 10/10/2018   Procedure: ENDOSCOPIC MUCOSAL RESECTION;  Surgeon: Rush Landmark Telford Nab., MD;  Location: [facility];  Service: Gastroenterology;  Laterality: N/A;  . EUS N/A 07/18/2018   Procedure: LOWER ENDOSCOPIC ULTRASOUND (EUS);  Surgeon: Irving Copas., MD;  Location: [place];  Service: Gastroenterology;  Laterality: N/A;  . EUS N/A 10/10/2018   Procedure: LOWER ENDOSCOPIC ULTRASOUND (EUS);  Surgeon: Irving Copas., MD;  Location: [place];  Service: Gastroenterology;  Laterality: N/A;  . FLEXIBLE SIGMOIDOSCOPY N/A 07/18/2018   Procedure: FLEXIBLE SIGMOIDOSCOPY;  Surgeon: Irving Copas., MD;  Location: [facility];  Service: Gastroenterology;  Laterality: N/A;  . FLEXIBLE SIGMOIDOSCOPY N/A 10/10/2018   Procedure: FLEXIBLE SIGMOIDOSCOPY;  Surgeon: Rush Landmark Telford Nab., MD;  Location: [facility];  Service: Gastroenterology;  Laterality: N/A;  . HEMOSTASIS CLIP PLACEMENT  10/10/2018   Procedure: HEMOSTASIS CLIP PLACEMENT;  Surgeon: Irving Copas., MD;  Location: [place];  Service: Gastroenterology;;  . Lia Foyer LIFTING INJECTION  10/10/2018   Procedure: SUBMUCOSAL LIFTING INJECTION;  Surgeon: Irving Copas., MD;  Location: [place];  Service: Gastroenterology;;  . TEE  WITHOUT CARDIOVERSION  02/2018  . TONSILLECTOMY    . TUBAL LIGATION  1987  . VIDEO BRONCHOSCOPY N/A 10/12/2018   Procedure: VIDEO BRONCHOSCOPY;  Surgeon: Grace Isaac, MD;  Location: [facility];  Service: Thoracic;  Laterality: N/A;    MEDICATIONS: . albuterol (PROVENTIL HFA;VENTOLIN HFA) 108 (90 Base) MCG/ACT inhaler  . atorvastatin (LIPITOR) 20 MG tablet  . busPIRone (BUSPAR) 10 MG tablet  . calcium carbonate (TUMS - DOSED IN MG ELEMENTAL CALCIUM) 500 MG chewable tablet  . Cyanocobalamin (B-12) 2500 MCG TABS  . fluticasone (FLONASE) 50 MCG/ACT nasal spray  . ibuprofen (ADVIL,MOTRIN) 200 MG tablet  . LORazepam (ATIVAN) 0.5 MG tablet  . meloxicam (MOBIC) 7.5 MG tablet  . methocarbamol (ROBAXIN) 500 MG tablet  . Multiple Vitamin (MULTIVITAMIN WITH MINERALS) TABS tablet   No current facility-administered medications for this encounter.     Myra Gianotti, PA-C Surgical Short Stay/Anesthesiology [facility] Phone [phone] [facility] Phone [phone] 12/28/2018 12:07 PM

## 2018-12-28 NOTE — Anesthesia Preprocedure Evaluation (Deleted)
Anesthesia Evaluation    Airway        Dental   Pulmonary former smoker,           Cardiovascular      Neuro/Psych    GI/Hepatic   Endo/Other    Renal/GU      Musculoskeletal   Abdominal   Peds  Hematology   Anesthesia Other Findings   Reproductive/Obstetrics                                                              Anesthesia Evaluation  Patient identified by MRN, date of birth, ID band Patient awake    Reviewed: Allergy & Precautions, NPO status , Patient's Chart, lab work & pertinent test results  Airway Mallampati: II  TM Distance: >3 FB Neck ROM: Full    Dental no notable dental hx.    Pulmonary asthma , COPD,  COPD inhaler, former smoker,    Pulmonary exam normal breath sounds clear to auscultation       Cardiovascular negative cardio ROS Normal cardiovascular exam Rhythm:Regular Rate:Normal  ECG: SR, rate 71  There was no ST segment deviation noted during stress. Pt walked for 9:00 of a Bruce protocol GXT. Peak HR of 153 which is 93% predicted max HR There were no ST or T wave changes to suggest ischemia Blood pressure demonstrated a hypertensive response to exercise. No arrhythmias This is interpreted as a negagative stress test. no evidence of ischemia .   Neuro/Psych PSYCHIATRIC DISORDERS Anxiety negative neurological ROS     GI/Hepatic negative GI ROS, Neg liver ROS,   Endo/Other  negative endocrine ROS  Renal/GU negative Renal ROS     Musculoskeletal negative musculoskeletal ROS (+)   Abdominal (+) + obese,   Peds  Hematology HLD   Anesthesia Other Findings ENDOBRONCHIAL LESION  Reproductive/Obstetrics                            Anesthesia Physical Anesthesia Plan  ASA: III  Anesthesia Plan: General   Post-op Pain Management:    Induction: Intravenous  PONV Risk Score and Plan: 3 and Ondansetron,  Midazolam and Treatment may vary due to age or medical condition  Airway Management Planned: Oral ETT  Additional Equipment:   Intra-op Plan:   Post-operative Plan: Extubation in OR  Informed Consent: I have reviewed the patients History and Physical, chart, labs and discussed the procedure including the risks, benefits and alternatives for the proposed anesthesia with the patient or authorized representative who has indicated his/her understanding and acceptance.     Dental advisory given  Plan Discussed with: CRNA  Anesthesia Plan Comments:         Anesthesia Quick Evaluation  Anesthesia Physical Anesthesia Plan  ASA:   Anesthesia Plan:    Post-op Pain Management:    Induction:   PONV Risk Score and Plan:   Airway Management Planned:   Additional Equipment:   Intra-op Plan:   Post-operative Plan:   Informed Consent:   Plan Discussed with:   Anesthesia Plan Comments: (PAT note written 12/28/2018 by Myra Gianotti, PA-C. )        Anesthesia Quick Evaluation

## 2018-12-29 ENCOUNTER — Encounter (HOSPITAL_COMMUNITY): Payer: Self-pay | Admitting: Certified Registered Nurse Anesthetist

## 2018-12-29 NOTE — Anesthesia Preprocedure Evaluation (Addendum)
Anesthesia Evaluation  Patient identified by MRN, date of birth, ID band Patient awake    Reviewed: Allergy & Precautions, NPO status , Patient's Chart, lab work & pertinent test results  History of Anesthesia Complications Negative for: history of anesthetic complications  Airway Mallampati: II  TM Distance: >3 FB Neck ROM: Full    Dental no notable dental hx. (+) Dental Advisory Given   Pulmonary asthma , COPD,  COPD inhaler, former smoker,  Carcinoid tumor   Pulmonary exam normal breath sounds clear to auscultation       Cardiovascular negative cardio ROS Normal cardiovascular exam Rhythm:Regular Rate:Normal  ECG: SR, rate 71  There was no ST segment deviation noted during stress. Pt walked for 9:00 of a Bruce protocol GXT. Peak HR of 153 which is 93% predicted max HR There were no ST or T wave changes to suggest ischemia Blood pressure demonstrated a hypertensive response to exercise. No arrhythmias This is interpreted as a negagative stress test. no evidence of ischemia .   Neuro/Psych PSYCHIATRIC DISORDERS Anxiety negative neurological ROS     GI/Hepatic negative GI ROS, Neg liver ROS,   Endo/Other  negative endocrine ROS  Renal/GU negative Renal ROS     Musculoskeletal negative musculoskeletal ROS (+)   Abdominal (+) + obese,   Peds  Hematology HLD   Anesthesia Other Findings ENDOBRONCHIAL LESION  Reproductive/Obstetrics                            Anesthesia Physical  Anesthesia Plan  ASA: III  Anesthesia Plan: General   Post-op Pain Management:    Induction: Intravenous  PONV Risk Score and Plan: 3 and Ondansetron, Treatment may vary due to age or medical condition, Dexamethasone and Scopolamine patch - Pre-op  Airway Management Planned: Double Lumen EBT  Additional Equipment: Arterial line  Intra-op Plan:   Post-operative Plan: Extubation in OR  Informed  Consent: I have reviewed the patients History and Physical, chart, labs and discussed the procedure including the risks, benefits and alternatives for the proposed anesthesia with the patient or authorized representative who has indicated his/her understanding and acceptance.     Dental advisory given  Plan Discussed with: CRNA and Anesthesiologist  Anesthesia Plan Comments:        Anesthesia Quick Evaluation

## 2018-12-30 ENCOUNTER — Other Ambulatory Visit: Payer: Self-pay

## 2018-12-30 ENCOUNTER — Inpatient Hospital Stay (HOSPITAL_COMMUNITY)
Admission: RE | Admit: 2018-12-30 | Discharge: 2019-01-04 | DRG: 164 | Disposition: A | Discharge status: Home / Self Care | Payer: BC Managed Care – PPO | Attending: Cardiothoracic Surgery | Admitting: Cardiothoracic Surgery

## 2018-12-30 ENCOUNTER — Encounter (HOSPITAL_COMMUNITY): Payer: Self-pay

## 2018-12-30 ENCOUNTER — Inpatient Hospital Stay (HOSPITAL_COMMUNITY): Payer: BC Managed Care – PPO | Admitting: Certified Registered Nurse Anesthetist

## 2018-12-30 ENCOUNTER — Inpatient Hospital Stay (HOSPITAL_COMMUNITY): Payer: BC Managed Care – PPO | Admitting: Vascular Surgery

## 2018-12-30 ENCOUNTER — Encounter (HOSPITAL_COMMUNITY)
Admission: RE | Disposition: A | Discharge status: Home / Self Care | Payer: Self-pay | Source: Home / Self Care | Attending: Cardiothoracic Surgery

## 2018-12-30 ENCOUNTER — Inpatient Hospital Stay (HOSPITAL_COMMUNITY): Payer: BC Managed Care – PPO

## 2018-12-30 DIAGNOSIS — D3A090 Benign carcinoid tumor of the bronchus and lung: Secondary | ICD-10-CM | POA: Diagnosis present

## 2018-12-30 DIAGNOSIS — C342 Malignant neoplasm of middle lobe, bronchus or lung: Secondary | ICD-10-CM

## 2018-12-30 DIAGNOSIS — R042 Hemoptysis: Secondary | ICD-10-CM | POA: Diagnosis present

## 2018-12-30 DIAGNOSIS — Z825 Family history of asthma and other chronic lower respiratory diseases: Secondary | ICD-10-CM

## 2018-12-30 DIAGNOSIS — Z8249 Family history of ischemic heart disease and other diseases of the circulatory system: Secondary | ICD-10-CM | POA: Diagnosis not present

## 2018-12-30 DIAGNOSIS — Z4682 Encounter for fitting and adjustment of non-vascular catheter: Secondary | ICD-10-CM

## 2018-12-30 DIAGNOSIS — Z902 Acquired absence of lung [part of]: Secondary | ICD-10-CM

## 2018-12-30 DIAGNOSIS — J45909 Unspecified asthma, uncomplicated: Secondary | ICD-10-CM | POA: Diagnosis present

## 2018-12-30 DIAGNOSIS — E860 Dehydration: Secondary | ICD-10-CM | POA: Diagnosis present

## 2018-12-30 DIAGNOSIS — I951 Orthostatic hypotension: Secondary | ICD-10-CM | POA: Diagnosis present

## 2018-12-30 DIAGNOSIS — E785 Hyperlipidemia, unspecified: Secondary | ICD-10-CM | POA: Diagnosis present

## 2018-12-30 DIAGNOSIS — Z85048 Personal history of other malignant neoplasm of rectum, rectosigmoid junction, and anus: Secondary | ICD-10-CM | POA: Diagnosis not present

## 2018-12-30 DIAGNOSIS — Z87891 Personal history of nicotine dependence: Secondary | ICD-10-CM | POA: Diagnosis not present

## 2018-12-30 DIAGNOSIS — Z09 Encounter for follow-up examination after completed treatment for conditions other than malignant neoplasm: Secondary | ICD-10-CM

## 2018-12-30 HISTORY — PX: LUNG CANCER SURGERY: SHX702

## 2018-12-30 HISTORY — PX: VIDEO ASSISTED THORACOSCOPY (VATS)/WEDGE RESECTION: SHX6174

## 2018-12-30 HISTORY — PX: VIDEO BRONCHOSCOPY: SHX5072

## 2018-12-30 LAB — GLUCOSE, CAPILLARY
Glucose-Capillary: 111 mg/dL — ABNORMAL HIGH (ref 70–99)
Glucose-Capillary: 112 mg/dL — ABNORMAL HIGH (ref 70–99)
Glucose-Capillary: 170 mg/dL — ABNORMAL HIGH (ref 70–99)

## 2018-12-30 SURGERY — BRONCHOSCOPY, VIDEO-ASSISTED
Anesthesia: General | Site: Chest | Laterality: Right

## 2018-12-30 MED ORDER — LEVALBUTEROL HCL 0.63 MG/3ML IN NEBU
0.6300 mg | INHALATION_SOLUTION | Freq: Four times a day (QID) | RESPIRATORY_TRACT | Status: DC
  Administered 2018-12-30 – 2019-01-04 (×19): 0.63 mg via RESPIRATORY_TRACT
  Filled 2018-12-30 (×19): qty 3

## 2018-12-30 MED ORDER — FENTANYL 40 MCG/ML IV SOLN
INTRAVENOUS | Status: DC
  Administered 2018-12-30: 120 ug via INTRAVENOUS
  Administered 2018-12-30: 30 ug via INTRAVENOUS
  Administered 2018-12-30: 16:00:00 via INTRAVENOUS
  Administered 2018-12-31: 75 ug via INTRAVENOUS
  Administered 2018-12-31: 30 ug via INTRAVENOUS
  Administered 2018-12-31: 90 ug via INTRAVENOUS
  Administered 2018-12-31: 60 ug via INTRAVENOUS
  Administered 2018-12-31: 105 ug via INTRAVENOUS
  Administered 2018-12-31: 75 ug via INTRAVENOUS
  Administered 2018-12-31: 45 ug via INTRAVENOUS
  Administered 2019-01-01: 1000 ug via INTRAVENOUS
  Administered 2019-01-01: 75 ug via INTRAVENOUS
  Administered 2019-01-01 (×2): 105 ug via INTRAVENOUS
  Administered 2019-01-01: 60 ug via INTRAVENOUS
  Administered 2019-01-02 (×2): 0 ug via INTRAVENOUS
  Filled 2018-12-30 (×2): qty 1000

## 2018-12-30 MED ORDER — SODIUM CHLORIDE 0.9 % IV SOLN
INTRAVENOUS | Status: DC | PRN
  Administered 2018-12-30: 08:00:00 15 ug/min via INTRAVENOUS

## 2018-12-30 MED ORDER — TRAMADOL HCL 50 MG PO TABS
50.0000 mg | ORAL_TABLET | Freq: Four times a day (QID) | ORAL | Status: DC | PRN
  Administered 2018-12-31 – 2019-01-01 (×4): 100 mg via ORAL
  Administered 2019-01-01: 50 mg via ORAL
  Administered 2019-01-01: 11:00:00 100 mg via ORAL
  Administered 2019-01-02 – 2019-01-03 (×2): 50 mg via ORAL
  Filled 2018-12-30: qty 2
  Filled 2018-12-30: qty 1
  Filled 2018-12-30: qty 2
  Filled 2018-12-30: qty 1
  Filled 2018-12-30: qty 2
  Filled 2018-12-30: qty 1
  Filled 2018-12-30 (×2): qty 2

## 2018-12-30 MED ORDER — SODIUM CHLORIDE (PF) 0.9 % IJ SOLN
INTRAMUSCULAR | Status: DC | PRN
  Administered 2018-12-30: 50 mL

## 2018-12-30 MED ORDER — LACTATED RINGERS IV SOLN
INTRAVENOUS | Status: DC | PRN
  Administered 2018-12-30: 07:00:00 via INTRAVENOUS

## 2018-12-30 MED ORDER — INSULIN ASPART 100 UNIT/ML ~~LOC~~ SOLN
0.0000 [IU] | SUBCUTANEOUS | Status: DC
  Administered 2018-12-30: 4 [IU] via SUBCUTANEOUS
  Administered 2018-12-31: 2 [IU] via SUBCUTANEOUS

## 2018-12-30 MED ORDER — OXYCODONE HCL 5 MG PO TABS
5.0000 mg | ORAL_TABLET | ORAL | Status: DC | PRN
  Administered 2018-12-31 – 2019-01-02 (×2): 10 mg via ORAL
  Filled 2018-12-30 (×2): qty 2

## 2018-12-30 MED ORDER — VANCOMYCIN HCL IN DEXTROSE 1-5 GM/200ML-% IV SOLN
1000.0000 mg | Freq: Two times a day (BID) | INTRAVENOUS | Status: AC
  Administered 2018-12-30: 1000 mg via INTRAVENOUS
  Filled 2018-12-30: qty 200

## 2018-12-30 MED ORDER — LIDOCAINE 2% (20 MG/ML) 5 ML SYRINGE
INTRAMUSCULAR | Status: DC | PRN
  Administered 2018-12-30: 100 mg via INTRAVENOUS

## 2018-12-30 MED ORDER — HEMOSTATIC AGENTS (NO CHARGE) OPTIME
TOPICAL | Status: DC | PRN
  Administered 2018-12-30: 1 via TOPICAL

## 2018-12-30 MED ORDER — ONDANSETRON HCL 4 MG/2ML IJ SOLN: 4.0000 mg | Freq: Four times a day (QID) | INTRAMUSCULAR | Status: DC | PRN

## 2018-12-30 MED ORDER — ONDANSETRON HCL 4 MG/2ML IJ SOLN
INTRAMUSCULAR | Status: AC
  Filled 2018-12-30: qty 2

## 2018-12-30 MED ORDER — BUPIVACAINE HCL (PF) 0.5 % IJ SOLN
INTRAMUSCULAR | Status: AC
  Filled 2018-12-30: qty 30

## 2018-12-30 MED ORDER — PHENYLEPHRINE 40 MCG/ML (10ML) SYRINGE FOR IV PUSH (FOR BLOOD PRESSURE SUPPORT)
PREFILLED_SYRINGE | INTRAVENOUS | Status: AC
  Filled 2018-12-30: qty 10

## 2018-12-30 MED ORDER — POTASSIUM CHLORIDE 10 MEQ/50ML IV SOLN: 10.0000 meq | Freq: Every day | INTRAVENOUS | Status: DC | PRN

## 2018-12-30 MED ORDER — FENTANYL CITRATE (PF) 100 MCG/2ML IJ SOLN
25.0000 ug | INTRAMUSCULAR | Status: DC | PRN
  Administered 2018-12-30 (×2): 50 ug via INTRAVENOUS

## 2018-12-30 MED ORDER — NALOXONE HCL 0.4 MG/ML IJ SOLN: 0.4000 mg | INTRAMUSCULAR | Status: DC | PRN

## 2018-12-30 MED ORDER — CEFAZOLIN SODIUM-DEXTROSE 2-4 GM/100ML-% IV SOLN
2.0000 g | INTRAVENOUS | Status: AC
  Administered 2018-12-30 (×2): 2 g via INTRAVENOUS
  Filled 2018-12-30: qty 100

## 2018-12-30 MED ORDER — EPHEDRINE 5 MG/ML INJ
INTRAVENOUS | Status: AC
  Filled 2018-12-30: qty 10

## 2018-12-30 MED ORDER — ONDANSETRON HCL 4 MG/2ML IJ SOLN
4.0000 mg | Freq: Four times a day (QID) | INTRAMUSCULAR | Status: DC | PRN
  Administered 2019-01-02: 4 mg via INTRAVENOUS
  Filled 2018-12-30: qty 2

## 2018-12-30 MED ORDER — ACETAMINOPHEN 160 MG/5ML PO SOLN: 1000.0000 mg | Freq: Four times a day (QID) | ORAL | Status: DC

## 2018-12-30 MED ORDER — DEXAMETHASONE SODIUM PHOSPHATE 10 MG/ML IJ SOLN
INTRAMUSCULAR | Status: DC | PRN
  Administered 2018-12-30: 4 mg via INTRAVENOUS

## 2018-12-30 MED ORDER — MIDAZOLAM HCL 5 MG/5ML IJ SOLN
INTRAMUSCULAR | Status: DC | PRN
  Administered 2018-12-30 (×2): 1 mg via INTRAVENOUS

## 2018-12-30 MED ORDER — LIDOCAINE 2% (20 MG/ML) 5 ML SYRINGE
INTRAMUSCULAR | Status: AC
  Filled 2018-12-30: qty 5

## 2018-12-30 MED ORDER — BUPIVACAINE HCL (PF) 0.5 % IJ SOLN
INTRAMUSCULAR | Status: DC | PRN
  Administered 2018-12-30: 30 mL

## 2018-12-30 MED ORDER — PROPOFOL 10 MG/ML IV BOLUS
INTRAVENOUS | Status: DC | PRN
  Administered 2018-12-30: 180 mg via INTRAVENOUS

## 2018-12-30 MED ORDER — ROCURONIUM BROMIDE 10 MG/ML (PF) SYRINGE
PREFILLED_SYRINGE | INTRAVENOUS | Status: DC | PRN
  Administered 2018-12-30: 50 mg via INTRAVENOUS
  Administered 2018-12-30: 20 mg via INTRAVENOUS
  Administered 2018-12-30: 10 mg via INTRAVENOUS
  Administered 2018-12-30: 20 mg via INTRAVENOUS

## 2018-12-30 MED ORDER — ACETAMINOPHEN 500 MG PO TABS
1000.0000 mg | ORAL_TABLET | Freq: Four times a day (QID) | ORAL | Status: DC
  Administered 2018-12-30 – 2019-01-04 (×14): 1000 mg via ORAL
  Filled 2018-12-30 (×15): qty 2

## 2018-12-30 MED ORDER — SCOPOLAMINE 1 MG/3DAYS TD PT72
1.0000 | MEDICATED_PATCH | TRANSDERMAL | Status: DC
  Administered 2018-12-30: 1.5 mg via TRANSDERMAL
  Filled 2018-12-30: qty 1

## 2018-12-30 MED ORDER — PROMETHAZINE HCL 25 MG/ML IJ SOLN: 6.2500 mg | INTRAMUSCULAR | Status: DC | PRN

## 2018-12-30 MED ORDER — FENTANYL CITRATE (PF) 100 MCG/2ML IJ SOLN
INTRAMUSCULAR | Status: DC | PRN
  Administered 2018-12-30 (×2): 50 ug via INTRAVENOUS
  Administered 2018-12-30: 25 ug via INTRAVENOUS
  Administered 2018-12-30 (×5): 50 ug via INTRAVENOUS
  Administered 2018-12-30: 100 ug via INTRAVENOUS
  Administered 2018-12-30: 25 ug via INTRAVENOUS

## 2018-12-30 MED ORDER — CEFAZOLIN SODIUM-DEXTROSE 2-4 GM/100ML-% IV SOLN
2.0000 g | Freq: Three times a day (TID) | INTRAVENOUS | Status: AC
  Administered 2018-12-30 – 2018-12-31 (×2): 2 g via INTRAVENOUS
  Filled 2018-12-30 (×2): qty 100

## 2018-12-30 MED ORDER — ACETAMINOPHEN 500 MG PO TABS
1000.0000 mg | ORAL_TABLET | Freq: Once | ORAL | Status: AC
  Administered 2018-12-30: 06:00:00 1000 mg via ORAL
  Filled 2018-12-30: qty 2

## 2018-12-30 MED ORDER — BISACODYL 5 MG PO TBEC
10.0000 mg | DELAYED_RELEASE_TABLET | Freq: Every day | ORAL | Status: DC
  Administered 2018-12-31 – 2019-01-02 (×3): 10 mg via ORAL
  Filled 2018-12-30 (×5): qty 2

## 2018-12-30 MED ORDER — ALBUMIN HUMAN 5 % IV SOLN
INTRAVENOUS | Status: DC | PRN
  Administered 2018-12-30: 13:00:00 via INTRAVENOUS

## 2018-12-30 MED ORDER — SUCCINYLCHOLINE CHLORIDE 200 MG/10ML IV SOSY
PREFILLED_SYRINGE | INTRAVENOUS | Status: AC
  Filled 2018-12-30: qty 10

## 2018-12-30 MED ORDER — ROCURONIUM BROMIDE 10 MG/ML (PF) SYRINGE
PREFILLED_SYRINGE | INTRAVENOUS | Status: AC
  Filled 2018-12-30: qty 10

## 2018-12-30 MED ORDER — PROPOFOL 10 MG/ML IV BOLUS
INTRAVENOUS | Status: AC
  Filled 2018-12-30: qty 40

## 2018-12-30 MED ORDER — SENNOSIDES-DOCUSATE SODIUM 8.6-50 MG PO TABS
1.0000 | ORAL_TABLET | Freq: Every day | ORAL | Status: DC
  Administered 2018-12-30 – 2019-01-03 (×5): 1 via ORAL
  Filled 2018-12-30 (×5): qty 1

## 2018-12-30 MED ORDER — ONDANSETRON HCL 4 MG/2ML IJ SOLN
INTRAMUSCULAR | Status: DC | PRN
  Administered 2018-12-30: 4 mg via INTRAVENOUS

## 2018-12-30 MED ORDER — FENTANYL CITRATE (PF) 100 MCG/2ML IJ SOLN
INTRAMUSCULAR | Status: AC
  Administered 2018-12-30: 50 ug via INTRAVENOUS
  Filled 2018-12-30: qty 2

## 2018-12-30 MED ORDER — ATORVASTATIN CALCIUM 10 MG PO TABS
20.0000 mg | ORAL_TABLET | Freq: Every evening | ORAL | Status: DC
  Administered 2018-12-31 – 2019-01-03 (×4): 20 mg via ORAL
  Filled 2018-12-30 (×4): qty 2

## 2018-12-30 MED ORDER — SODIUM CHLORIDE 0.9% FLUSH: 9.0000 mL | INTRAVENOUS | Status: DC | PRN

## 2018-12-30 MED ORDER — FENTANYL CITRATE (PF) 250 MCG/5ML IJ SOLN
INTRAMUSCULAR | Status: AC
  Filled 2018-12-30: qty 5

## 2018-12-30 MED ORDER — SUCCINYLCHOLINE CHLORIDE 200 MG/10ML IV SOSY
PREFILLED_SYRINGE | INTRAVENOUS | Status: DC | PRN
  Administered 2018-12-30: 140 mg via INTRAVENOUS

## 2018-12-30 MED ORDER — 0.9 % SODIUM CHLORIDE (POUR BTL) OPTIME
TOPICAL | Status: DC | PRN
  Administered 2018-12-30: 2000 mL

## 2018-12-30 MED ORDER — CEFAZOLIN SODIUM 1 G IJ SOLR
INTRAMUSCULAR | Status: AC
  Filled 2018-12-30: qty 20

## 2018-12-30 MED ORDER — DEXAMETHASONE SODIUM PHOSPHATE 10 MG/ML IJ SOLN
INTRAMUSCULAR | Status: AC
  Filled 2018-12-30: qty 1

## 2018-12-30 MED ORDER — BUSPIRONE HCL 10 MG PO TABS
5.0000 mg | ORAL_TABLET | Freq: Two times a day (BID) | ORAL | Status: DC
  Administered 2018-12-31 – 2019-01-04 (×9): 5 mg via ORAL
  Filled 2018-12-30 (×9): qty 1

## 2018-12-30 MED ORDER — DEXTROSE-NACL 5-0.45 % IV SOLN
INTRAVENOUS | Status: DC
  Administered 2018-12-30 – 2018-12-31 (×2): via INTRAVENOUS

## 2018-12-30 MED ORDER — MIDAZOLAM HCL 2 MG/2ML IJ SOLN
INTRAMUSCULAR | Status: AC
  Filled 2018-12-30: qty 2

## 2018-12-30 MED ORDER — SUGAMMADEX SODIUM 200 MG/2ML IV SOLN
INTRAVENOUS | Status: DC | PRN
  Administered 2018-12-30: 167.8 mg via INTRAVENOUS

## 2018-12-30 MED ORDER — DIPHENHYDRAMINE HCL 50 MG/ML IJ SOLN: 12.5000 mg | Freq: Four times a day (QID) | INTRAMUSCULAR | Status: DC | PRN

## 2018-12-30 MED ORDER — ENOXAPARIN SODIUM 40 MG/0.4ML ~~LOC~~ SOLN
40.0000 mg | Freq: Every day | SUBCUTANEOUS | Status: DC
  Administered 2018-12-30 – 2019-01-03 (×5): 40 mg via SUBCUTANEOUS
  Filled 2018-12-30 (×5): qty 0.4

## 2018-12-30 MED ORDER — BUPIVACAINE LIPOSOME 1.3 % IJ SUSP
INTRAMUSCULAR | Status: DC | PRN
  Administered 2018-12-30: 20 mL

## 2018-12-30 MED ORDER — DIPHENHYDRAMINE HCL 12.5 MG/5ML PO ELIX
12.5000 mg | ORAL_SOLUTION | Freq: Four times a day (QID) | ORAL | Status: DC | PRN
  Filled 2018-12-30: qty 5

## 2018-12-30 SURGICAL SUPPLY — 104 items
ADAPTER VALVE BIOPSY EBUS (MISCELLANEOUS) IMPLANT
ADH SKN CLS APL DERMABOND .7 (GAUZE/BANDAGES/DRESSINGS) ×2
ADPTR VALVE BIOPSY EBUS (MISCELLANEOUS)
APL SRG 22X2 LUM MLBL SLNT (VASCULAR PRODUCTS)
APL SRG 7X2 LUM MLBL SLNT (VASCULAR PRODUCTS)
APPLICATOR TIP COSEAL (VASCULAR PRODUCTS) IMPLANT
APPLICATOR TIP EXT COSEAL (VASCULAR PRODUCTS) IMPLANT
BRUSH CYTOL CELLEBRITY 1.5X140 (MISCELLANEOUS) IMPLANT
CANISTER SUCT 3000ML PPV (MISCELLANEOUS) ×3 IMPLANT
CATH THORACIC 28FR (CATHETERS) IMPLANT
CATH THORACIC 36FR (CATHETERS) IMPLANT
CATH THORACIC 36FR RT ANG (CATHETERS) IMPLANT
CLIP VESOCCLUDE MED 24/CT (CLIP) ×1 IMPLANT
CLIP VESOCCLUDE MED 6/CT (CLIP) ×2 IMPLANT
CONN ST 1/4X3/8  BEN (MISCELLANEOUS) ×1
CONN ST 1/4X3/8 BEN (MISCELLANEOUS) IMPLANT
CONT SPEC 4OZ CLIKSEAL STRL BL (MISCELLANEOUS) ×11 IMPLANT
COVER BACK TABLE 60X90IN (DRAPES) ×2 IMPLANT
COVER WAND RF STERILE (DRAPES) ×2 IMPLANT
CUTTER ECHEON FLEX ENDO 45 340 (ENDOMECHANICALS) ×1 IMPLANT
DERMABOND ADVANCED (GAUZE/BANDAGES/DRESSINGS) ×1
DERMABOND ADVANCED .7 DNX12 (GAUZE/BANDAGES/DRESSINGS) IMPLANT
DISSECTOR BLUNT TIP ENDO 5MM (MISCELLANEOUS) IMPLANT
DRAIN CHANNEL 28F RND 3/8 FF (WOUND CARE) ×1 IMPLANT
DRAIN CHANNEL 32F RND 10.7 FF (WOUND CARE) IMPLANT
DRAPE INCISE IOBAN 66X45 STRL (DRAPES) ×1 IMPLANT
DRAPE LAPAROSCOPIC ABDOMINAL (DRAPES) ×3 IMPLANT
DRILL BIT 7/64X5 (BIT) ×1 IMPLANT
ELECT BLADE 4.0 EZ CLEAN MEGAD (MISCELLANEOUS) ×3
ELECT BLADE 6.5 EXT (BLADE) ×3 IMPLANT
ELECT REM PT RETURN 9FT ADLT (ELECTROSURGICAL) ×3
ELECTRODE BLDE 4.0 EZ CLN MEGD (MISCELLANEOUS) ×2 IMPLANT
ELECTRODE REM PT RTRN 9FT ADLT (ELECTROSURGICAL) ×2 IMPLANT
FORCEPS BIOP RJ4 1.8 (CUTTING FORCEPS) IMPLANT
GAUZE SPONGE 4X4 12PLY STRL (GAUZE/BANDAGES/DRESSINGS) ×3 IMPLANT
GAUZE SPONGE 4X4 12PLY STRL LF (GAUZE/BANDAGES/DRESSINGS) ×1 IMPLANT
GLOVE BIO SURGEON STRL SZ 6.5 (GLOVE) ×8 IMPLANT
GOWN STRL REUS W/ TWL LRG LVL3 (GOWN DISPOSABLE) ×8 IMPLANT
GOWN STRL REUS W/TWL LRG LVL3 (GOWN DISPOSABLE) ×24
KIT BASIN OR (CUSTOM PROCEDURE TRAY) ×3 IMPLANT
KIT CLEAN ENDO COMPLIANCE (KITS) ×5 IMPLANT
KIT SUCTION CATH 14FR (SUCTIONS) ×4 IMPLANT
KIT TURNOVER KIT B (KITS) ×3 IMPLANT
LOOP VESSEL MAXI BLUE (MISCELLANEOUS) ×1 IMPLANT
MARKER SKIN DUAL TIP RULER LAB (MISCELLANEOUS) IMPLANT
NDL SPNL 18GX3.5 QUINCKE PK (NEEDLE) ×2 IMPLANT
NEEDLE SPNL 18GX3.5 QUINCKE PK (NEEDLE) ×3 IMPLANT
NS IRRIG 1000ML POUR BTL (IV SOLUTION) ×7 IMPLANT
OIL SILICONE PENTAX (PARTS (SERVICE/REPAIRS)) ×3 IMPLANT
PACK CHEST (CUSTOM PROCEDURE TRAY) ×3 IMPLANT
PAD ARMBOARD 7.5X6 YLW CONV (MISCELLANEOUS) ×9 IMPLANT
PASSER SUT SWANSON 36MM LOOP (INSTRUMENTS) ×1 IMPLANT
RELOAD STAPLE 35X2.5 WHT THIN (STAPLE) IMPLANT
RELOAD STAPLE 45 GOLD REG/THCK (STAPLE) IMPLANT
SCISSORS LAP 5X35 DISP (ENDOMECHANICALS) IMPLANT
SEALANT PROGEL (MISCELLANEOUS) ×1 IMPLANT
SEALANT SURG COSEAL 4ML (VASCULAR PRODUCTS) IMPLANT
SEALANT SURG COSEAL 8ML (VASCULAR PRODUCTS) IMPLANT
SOLUTION ANTI FOG 6CC (MISCELLANEOUS) ×3 IMPLANT
SPONGE TONSIL TAPE 1 RFD (DISPOSABLE) IMPLANT
STAPLE RELOAD 2.5MM WHITE (STAPLE) ×12 IMPLANT
STAPLE RELOAD 45MM GOLD (STAPLE) ×9 IMPLANT
STAPLER ECHELON POWERED (MISCELLANEOUS) IMPLANT
STAPLER VASCULAR ECHELON 35 (CUTTER) ×1 IMPLANT
STOPCOCK 4 WAY LG BORE MALE ST (IV SETS) ×3 IMPLANT
SUT PROLENE 3 0 SH DA (SUTURE) IMPLANT
SUT PROLENE 4 0 RB 1 (SUTURE) ×36
SUT PROLENE 4-0 RB1 .5 CRCL 36 (SUTURE) IMPLANT
SUT SILK  1 MH (SUTURE) ×2
SUT SILK 1 MH (SUTURE) ×8 IMPLANT
SUT SILK 1 TIES 10X30 (SUTURE) IMPLANT
SUT SILK 2 0 SH (SUTURE) IMPLANT
SUT SILK 2 0SH CR/8 30 (SUTURE) ×1 IMPLANT
SUT STEEL 1 (SUTURE) IMPLANT
SUT VIC AB 0 CTX 18 (SUTURE) ×3 IMPLANT
SUT VIC AB 1 CTX 18 (SUTURE) IMPLANT
SUT VIC AB 1 CTX 36 (SUTURE)
SUT VIC AB 1 CTX36XBRD ANBCTR (SUTURE) IMPLANT
SUT VIC AB 2-0 CTX 36 (SUTURE) IMPLANT
SUT VIC AB 3-0 SH 8-18 (SUTURE) IMPLANT
SUT VIC AB 3-0 X1 27 (SUTURE) IMPLANT
SUT VICRYL 0 UR6 27IN ABS (SUTURE) IMPLANT
SUT VICRYL 2 TP 1 (SUTURE) ×1 IMPLANT
SYR 20ML ECCENTRIC (SYRINGE) ×3 IMPLANT
SYR 5ML LL (SYRINGE) ×3 IMPLANT
SYRINGE 60CC LL (MISCELLANEOUS) ×3 IMPLANT
SYSTEM SAHARA CHEST DRAIN ATS (WOUND CARE) ×3 IMPLANT
TAPE CLOTH SURG 4X10 WHT LF (GAUZE/BANDAGES/DRESSINGS) ×1 IMPLANT
TAPE UMBILICAL COTTON 1/8X30 (MISCELLANEOUS) IMPLANT
TIP APPLICATOR SPRAY EXTEND 16 (VASCULAR PRODUCTS) ×1 IMPLANT
TOWEL GREEN STERILE (TOWEL DISPOSABLE) ×3 IMPLANT
TOWEL GREEN STERILE FF (TOWEL DISPOSABLE) ×3 IMPLANT
TRAP SPECIMEN MUCOUS 40CC (MISCELLANEOUS) ×1 IMPLANT
TRAY FOLEY MTR SLVR 16FR STAT (SET/KITS/TRAYS/PACK) ×3 IMPLANT
TROCAR BLADELESS 12MM (ENDOMECHANICALS) ×1 IMPLANT
TROCAR XCEL 12X100 BLDLESS (ENDOMECHANICALS) IMPLANT
TROCAR XCEL BLUNT TIP 100MML (ENDOMECHANICALS) ×1 IMPLANT
TUBE CONNECTING 20X1/4 (TUBING) ×3 IMPLANT
TUBE SUCTION CARDIAC 10FR (CANNULA) ×1 IMPLANT
TUBING EXTENTION W/L.L. (IV SETS) ×3 IMPLANT
VALVE BIOPSY  SINGLE USE (MISCELLANEOUS) ×1
VALVE BIOPSY SINGLE USE (MISCELLANEOUS) ×2 IMPLANT
VALVE SUCTION BRONCHIO DISP (MISCELLANEOUS) ×3 IMPLANT
WATER STERILE IRR 1000ML POUR (IV SOLUTION) ×5 IMPLANT

## 2018-12-30 NOTE — Brief Op Note (Addendum)
      [address]       [place], [address]             [phone]      12/30/2018  1:05 PM  PATIENT:  Shannon [name]  58 y.o. female  PRE-OPERATIVE DIAGNOSIS:  CARCINOID TUMOR  POST-OPERATIVE DIAGNOSIS:  CARCINOID TUMOR  PROCEDURE:  Procedure(s): VIDEO BRONCHOSCOPY (N/A) VIDEO ASSISTED THORACOSCOPY (VATS), MINI THORACOTOMY, RIGHT MIDDLE LOBECTOMY WITH LYMPH NODE DISSECTION (Right)  SURGEON:  Surgeon(s) and Role:    * Grace Isaac, MD - Primary  PHYSICIAN ASSISTANT:  Nicholes Rough, PA-C   ANESTHESIA:   general  EBL:  600 mL   BLOOD ADMINISTERED:none  DRAINS: ONE BLAKE DRAIN   LOCAL MEDICATIONS USED:  BUPIVICAINE   SPECIMEN:  Source of Specimen:  RIGHT MIDDLE LOBE, BRONCHIAL MASS  DISPOSITION OF SPECIMEN:  PATHOLOGY  COUNTS:  YES  DICTATION: .Dragon Dictation  PLAN OF CARE: Admit to inpatient   PATIENT DISPOSITION:  ICU - intubated and hemodynamically stable.   Delay start of Pharmacological VTE agent (>24hrs) due to surgical blood loss or risk of bleeding: no

## 2018-12-30 NOTE — OR Nursing (Signed)
Husband Grayland Ormond called and updated at 1138.

## 2018-12-30 NOTE — Anesthesia Procedure Notes (Signed)
Procedure Name: Intubation Date/Time: 12/30/2018 7:47 AM Performed by: Lowella Dell, CRNA Pre-anesthesia Checklist: Patient identified, Emergency Drugs available, Suction available and Patient being monitored Patient Re-evaluated:Patient Re-evaluated prior to induction Oxygen Delivery Method: Circle System Utilized Preoxygenation: Pre-oxygenation with 100% oxygen Induction Type: Inhalational induction with existing ETT Laryngoscope Size: Mac and 3 Grade View: Grade I Endobronchial tube: EBT position confirmed by auscultation, EBT position confirmed by fiberoptic bronchoscope, Double lumen EBT and Left and 37 Fr Number of attempts: 1 Airway Equipment and Method: Stylet Placement Confirmation: ETT inserted through vocal cords under direct vision,  positive ETCO2 and breath sounds checked- equal and bilateral Secured at: 28 cm Tube secured with: Tape Dental Injury: Teeth and Oropharynx as per pre-operative assessment

## 2018-12-30 NOTE — Anesthesia Procedure Notes (Signed)
Procedure Name: Intubation Date/Time: 12/30/2018 7:39 AM Performed by: Lowella Dell, CRNA Pre-anesthesia Checklist: Patient identified, Emergency Drugs available, Suction available and Patient being monitored Patient Re-evaluated:Patient Re-evaluated prior to induction Oxygen Delivery Method: Circle System Utilized Preoxygenation: Pre-oxygenation with 100% oxygen Induction Type: IV induction and Rapid sequence Laryngoscope Size: Mac and 3 Grade View: Grade I Tube type: Oral Tube size: 8.0 mm Number of attempts: 1 Airway Equipment and Method: Stylet Placement Confirmation: ETT inserted through vocal cords under direct vision,  positive ETCO2 and breath sounds checked- equal and bilateral Secured at: 22 cm Tube secured with: Tape Dental Injury: Teeth and Oropharynx as per pre-operative assessment  Comments: For bronchoscopy

## 2018-12-30 NOTE — Transfer of Care (Signed)
Immediate Anesthesia Transfer of Care Note  Patient: Shannon [name]  Procedure(s) Performed: VIDEO BRONCHOSCOPY (N/A ) VIDEO ASSISTED THORACOSCOPY (VATS), MINI THORACOTOMY, RIGHT MIDDLE LOBECTOMY WITH LYMPH NODE DISSECTION (Right Chest)  Patient Location: PACU  Anesthesia Type:General  Level of Consciousness: awake and patient cooperative  Airway & Oxygen Therapy: Patient Spontanous Breathing and Patient connected to nasal cannula oxygen  Post-op Assessment: Report given to RN, Post -op Vital signs reviewed and stable and Patient moving all extremities X 4  Post vital signs: Reviewed and stable  Last Vitals:  Vitals Value Taken Time  BP 144/70 12/30/2018  1:25 PM  Temp 36.5 C 12/30/2018  1:25 PM  Pulse 70 12/30/2018  1:32 PM  Resp 13 12/30/2018  1:32 PM  SpO2 100 % 12/30/2018  1:32 PM  Vitals shown include unvalidated device data.  Last Pain:  Vitals:   12/30/18 1325  TempSrc:   PainSc: Asleep         Complications: No apparent anesthesia complications

## 2018-12-30 NOTE — Anesthesia Procedure Notes (Signed)
Arterial Line Insertion Start/End6/11/2018 6:30 AM Performed by: Lowella Dell, CRNA, CRNA  Patient location: Pre-op. Preanesthetic checklist: patient identified, IV checked, site marked, risks and benefits discussed, surgical consent, monitors and equipment checked, pre-op evaluation, timeout performed and anesthesia consent Lidocaine 1% used for infiltration Left, radial was placed Catheter size: 20 G Hand hygiene performed  and maximum sterile barriers used   Attempts: 1 Procedure performed without using ultrasound guided technique. Following insertion, dressing applied and Biopatch. Post procedure assessment: normal  Patient tolerated the procedure well with no immediate complications.

## 2018-12-30 NOTE — Anesthesia Procedure Notes (Signed)
Central Venous Catheter Insertion Performed by: Duane Boston, MD, anesthesiologist Start/End6/11/2018 6:51 AM, 12/30/2018 7:01 AM Patient location: Pre-op. Preanesthetic checklist: patient identified, IV checked, site marked, risks and benefits discussed, surgical consent, monitors and equipment checked, pre-op evaluation, timeout performed and anesthesia consent Position: Trendelenburg Lidocaine 1% used for infiltration and patient sedated Hand hygiene performed , maximum sterile barriers used  and Seldinger technique used Catheter size: 8 Fr Total catheter length 16. Central line was placed.Double lumen Procedure performed without using ultrasound guided technique. Attempts: 1 Following insertion, dressing applied, line sutured and Biopatch. Post procedure assessment: blood return through all ports, free fluid flow and no air  Patient tolerated the procedure well with no immediate complications.

## 2018-12-30 NOTE — H&P (Signed)
Shannon Shannon [name] [address]       Shannon Shannon [name],Shannon Shannon [name] [id number]             [phone]                Shannon Shannon [name] [place] Medical Record #[id number] Date of Birth: 09/18/1960  Referring: Shannon Shannon [name].* Primary Care: Shannon Shannon [name], Shannon [name] Primary Cardiologist: No primary care provider on file.  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  She was originally seen for abnormal PET/ Ga 68 DOTATATE scan , a follow-up CT of the chest to compare to the one she had in August 2019.  the patient had undergone her first routine screening colonoscopy and was found to have a  submucosal mass was found in the posterior rectum approximately 3 to 4 cm from the anal verge. The mass was circular and well-circumscribed with a small central umbilication. IIt diameter measured 12-15 mm.  EUS with submucosal resection was performed July 18, 2018. 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 - LYMPHOVASCULAR INVASION IS IDENTIFIED. - TUMOR IS FOCALLY PRESENT AT THE CAUTERIZED TISSUE EDGE Ki-67 is approximately 1% in the tumor cells   A PEPET/ Ga 68 DOTATATETscan was performed January 22 with a small focus of uptake in the right hilum.  Patient was referred to thoracic surgery for further recommendations.  Patient is a former smoker up to a pack a day for 20 years but quit has been smoke-free for the last 12 years.  She has a history of asthma as a child and currently uses inhalers as needed  Bronchoscopy was done with broad-based external compression of the right middle lobe bronchus.       Since last seen the patient has had no respiratory problems, she has not knowingly been exposed to or developed  COVID infection.  Current Activity/ Functional Status:  Patient is independent with mobility/ambulation, transfers, ADL's, IADL's.   Zubrod Score: At the time of surgery this patients most appropriate activity status/level should be described as: '[x]'     0    Normal activity, no symptoms '[]'     1    Restricted in physical strenuous activity but ambulatory, able to do out light work '[]'     2    Ambulatory and capable of self care, unable to do work activities, up and about               >50 % of waking hours                              '[]'     3    Only limited self care, in bed greater than 50% of waking hours '[]'     4    Completely disabled, no self care, confined to bed or chair '[]'     5    Moribund   Past Medical History:  Diagnosis Date   Anxiety    Arthritis    Asthma    Cancer ([place])    rectal, Lung   Dyspnea    with exertion   Emphysema of lung ([place])    patient denies   History of kidney  stones    passed   HLD (hyperlipidemia)    Pneumonia 02/2018   Rectal tumor     Past Surgical History:  Procedure Laterality Date   ABLATION     endometrial   BIOPSY  10/10/2018   Procedure: BIOPSY;  Surgeon: Shannon Landmark Telford Nab., Shannon [name];  Location: [facility];  Service: Gastroenterology;;   COLONOSCOPY     DOPPLER ECHOCARDIOGRAPHY     ENDOSCOPIC MUCOSAL RESECTION N/A 10/10/2018   Procedure: ENDOSCOPIC MUCOSAL RESECTION;  Surgeon: Shannon Shannon [name]., Shannon [name];  Location: [facility];  Service: Gastroenterology;  Laterality: N/A;   EUS N/A 07/18/2018   Procedure: LOWER ENDOSCOPIC ULTRASOUND (EUS);  Surgeon: Shannon Shannon [name]., Shannon [name];  Location: [facility];  Service: Gastroenterology;  Laterality: N/A;   EUS N/A 10/10/2018   Procedure: LOWER ENDOSCOPIC ULTRASOUND (EUS);  Surgeon: Shannon Shannon [name]., Shannon [name];  Location: [facility];  Service: Gastroenterology;  Laterality: N/A;   FLEXIBLE SIGMOIDOSCOPY N/A 07/18/2018   Procedure: FLEXIBLE SIGMOIDOSCOPY;  Surgeon:  Shannon Landmark Telford Nab., Shannon [name];  Location: [facility];  Service: Gastroenterology;  Laterality: N/A;   FLEXIBLE SIGMOIDOSCOPY N/A 10/10/2018   Procedure: FLEXIBLE SIGMOIDOSCOPY;  Surgeon: Shannon Landmark Telford Nab., Shannon [name];  Location: [facility];  Service: Gastroenterology;  Laterality: N/A;   HEMOSTASIS CLIP PLACEMENT  10/10/2018   Procedure: HEMOSTASIS CLIP PLACEMENT;  Surgeon: Shannon Landmark Telford Nab., Shannon [name];  Location: [facility] ENDOSCOPY;  Service: Gastroenterology;;   SUBMUCOSAL LIFTING INJECTION  10/10/2018   Procedure: SUBMUCOSAL LIFTING INJECTION;  Surgeon: Shannon Shannon [name]., Shannon [name];  [facility]COPY;  Service: Gastroenterology;;   TEE WITHOUT CARDIOVERSION  02/2018   TONSILLECTOMY     TUBAL LIGATION  1987   VIDEO BRONCHOSCOPY N/A 10/12/2018   Procedure: VIDEO BRONCHOSCOPY;  Surgeon: Shannon Isaac, Shannon [name];  Location: [facility];  Service: Thoracic;  Laterality: N/A;    Family History  Problem Relation Age of Onset   Heart disease Mother    COPD Mother    Emphysema Mother    Diabetes Father    Colon cancer Neg Hx    Colon polyps Neg Hx    Esophageal cancer Neg Hx    Rectal cancer Neg Hx    Stomach cancer Neg Hx    Father committed suicide at age 61 Mother died at age 72 of COPD complications One sister has thyroid disease Patient has 2 children who are healthy  Social History   Tobacco Use  Smoking Status Former Smoker   Years: 33.00   Last attempt to quit: 2008   Years since quitting: 12.4  Smokeless Tobacco Never Used    Social History   Substance and Sexual Activity  Alcohol Use Not Currently   Comment: occ- rare      Allergies  Allergen Reactions   Hydrocodone Nausea Only    "terrible headache"   Other     UNSPECIFIED REACTION  Dog Dander    Levaquin [Levofloxacin In D5w] Nausea And Vomiting         Current Facility-Administered Medications  Medication Dose Route Frequency Provider Last Rate Last Dose   ceFAZolin (ANCEF) IVPB 2g/100 mL  premix  2 g Intravenous 30 min Pre-Op Shannon Isaac, Shannon [name]       scopolamine (TRANSDERM-SCOP) 1 MG/3DAYS 1.5 mg  1 patch Transdermal Q72H Shannon Boston, Shannon [name]   1.5 mg at 12/30/18 5400    Pertinent items are noted in HPI.   Review of Systems:     Cardiac Review of Systems: [Y] = yes  or   [ N ] = no  Chest Pain [n]  Resting SOB [n ] Exertional SOB  [n]  Orthopnea [n  ]   Pedal Edema [  n    Palpitations [nSyncope  [n   Presyncope [ n   General Review of Systems: [Y] = yes [  ]=no Constitional: recent weight change [  ];  Wt loss over the last 3 months [   ] anorexia [  ]; fatigue [  ]; nausea [  ]; night sweats [  ]; fever [  ]; or chills [  ];           Eye : blurred vision [  ]; diplopia [   ]; vision changes [  ];  Amaurosis fugax[  ]; Resp: cough [  ];  wheezing[  ];  hemoptysis[  ]; shortness of breath[  ]; paroxysmal nocturnal dyspnea[  ]; dyspnea on exertion[  ]; or orthopnea[  ];  GI:  gallstones[  ], vomiting[  ];  dysphagia[  ]; melena[  ];  hematochezia [  ]; heartburn[  ];   Hx of  Colonoscopy[ Y ]; GU: kidney stones [  ]; hematuria[  ];   dysuria [  ];  nocturia[  ];  history of     obstruction [  ]; urinary frequency [  ]             Skin: rash, swelling[  ];, hair loss[  ];  peripheral edema[  ];  or itching[  ]; Musculosketetal: myalgias[  ];  joint swelling[  ];  joint erythema[  ];  joint pain[  ];  back pain[  ];  Heme/Lymph: bruising[  ];  bleeding[  ];  anemia[  ];  Neuro: TIA[  ];  headaches[  ];  stroke[  ];  vertigo[  ];  seizures[  ];   paresthesias[  ];  difficulty walking[  ];  Psych:depression[  ]; anxiety[  ];  Endocrine: diabetes[  ];  thyroid dysfunction[  ];  Immunizations: Flu up to date Blue.Reese  ]; Pneumococcal up to date Florencio.Farrier  ];  Other:     PHYSICAL EXAMINATION: BP (!) 153/71    Pulse 62    Temp 98.3 F (36.8 C) (Oral)    Ht 5' 5.5" (1.664 m)    Wt 83.9 kg    SpO2 98%    BMI 30.32 kg/m  General appearance: alert, cooperative, appears stated age and no  distress Head: Normocephalic, without obvious abnormality, atraumatic Lymph nodes: Cervical, supraclavicular, and axillary nodes normal. Resp: clear to auscultation bilaterally Cardio: regular rate and rhythm, S1, S2 normal, no murmur, click, rub or gallop GI: soft, non-tender; bowel sounds normal; no masses,  no organomegaly Extremities: extremities normal, atraumatic, no cyanosis or edema and Homans sign is negative, no sign of DVT Neurologic: Grossly normal  Diagnostic Studies & Laboratory data:     Recent Radiology Findings:  Dg Chest 2 View  Result Date: 12/27/2018 CLINICAL DATA:  Preoperative study for lung surgery. EXAM: CHEST - 2 VIEW COMPARISON:  10/12/2018.  Chest CT 10/04/2018.  PET-CT 08/17/2018. FINDINGS: Mediastinum and hilar structures normal. Pulmonary nodule noted in the right hilar region on prior recent CT and PET-CT is best identified by the studies. No other focal pulmonary abnormalities identified. No pleural effusion or pneumothorax. Degenerative change thoracic spine. IMPRESSION: No acute cardiopulmonary disease. Pulmonary nodule in the right hilum noted on prior recent CT and PET-CT is best identified by the studies. Electronically Signed   By: Shannon Shannon [name]  Register  On: 12/27/2018 14:21   Ct Super D Chest Wo Contrast  Result Date: 10/04/2018 CLINICAL DATA:  Pulmonary nodule avid for neuroendocrine tumor PET imaging agent. History of rectal carcinoma. EXAM: CT CHEST WITHOUT CONTRAST TECHNIQUE: Multidetector CT imaging of the chest was performed using thin slice collimation for electromagnetic bronchoscopy planning purposes, without intravenous contrast. COMPARISON:  No tape PET scan 08/17/2018 FINDINGS: Cardiovascular: No significant vascular findings. Normal heart size. No pericardial effusion. Mediastinum/Nodes: No axillary supraclavicular adenopathy. No mediastinal hilar adenopathy. No pericardial effusion. Esophagus normal. Lungs/Pleura: The small nodule identified on  comparison DOTATATE PET scan localizes to a small endobronchial lesion at the bifurcation of the RIGHT middle lobe bronchi. This small ovoid lesion measures 8 mm (image 76/8 axial series; image 100/6/sagittal series, and image 80/4/coronal series) No additional pulmonary nodules. Upper Abdomen: Limited view of the liver, kidneys, pancreas are unremarkable. Normal adrenal glands. Musculoskeletal: No aggressive osseous lesion. IMPRESSION: 1. Small endobronchial lesion at the takeoff of the RIGHT middle lobe bronchus corresponds to the lesion of concern on comparison DOTATATE PET scan. 2. No lymphadenopathy. Electronically Signed   By: Shannon Shannon [name] M.D.   On: 10/04/2018 16:26    Nm Pet (netspot Ga 68 Dotatate) Skull Base To Mid Thigh  Result Date: 08/17/2018 CLINICAL DATA:  Carcinoma rectum identified on colonoscopy. Subsequent endoscopic mucosal resection. EXAM: NUCLEAR MEDICINE PET SKULL BASE TO THIGH TECHNIQUE: 5.1 mCi Ga 72 DOTATATE was injected intravenously. Full-ring PET imaging was performed from the skull base to thigh after the radiotracer. CT data was obtained and used for attenuation correction and anatomic localization. COMPARISON:  None. FINDINGS: NECK No radiotracer activity in neck lymph nodes. Incidental CT findings: None CHEST Within the RIGHT hilum, a discrete focus of radiotracer accumulation with SUV max equal 11.7. On close inspection there is a small a parenchymal nodule measuring 6 mm (image 35/8) is immediately adjacent to the RIGHT lower lobe bronchus which corresponds the activity. No additional abnormal radiotracer in the thorax. No additional suspicious pulmonary nodules. Incidental CT finding:None ABDOMEN/PELVIS There is no activity within the rectum to suggest residual neuroendocrine tumor. Surgical clip noted distal rectum. There is no abnormal radiotracer accumulation within pelvic lymph nodes. No abdominal lymphadenopathy. No abnormal radiotracer accumulation within the liver.  Physiologic activity noted in the adrenal glands, spleen and kidneys. Physiologic activity noted in the liver, spleen, adrenal glands and kidneys. Incidental CT findings:Lobular uterus consistent leiomyoma. SKELETON No focal activity to suggest skeletal metastasis. Incidental CT findings:None IMPRESSION: 1. No evidence residual neuroendocrine tumor within the rectum 2. No metastatic neuroendocrine tumor adenopathy in the abdomen or pelvis. 3. A single focus of intense uptake about the RIGHT hilum which localizes to a small pulmonary nodule. 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 Recommend follow-up CT in 3 to 6 months to demonstrate stability. I suspect this lesion would be difficult to resect. Electronically Signed   By: Shannon Shannon [name] M.D.   On: 08/17/2018 17:01     I have independently reviewed the above radiology studies  and reviewed the findings with the patient.   Recent Lab Findings: Lab Results  Component Value Date   WBC 5.1 12/27/2018   HGB 14.3 12/27/2018   HCT 44.5 12/27/2018   PLT 218 12/27/2018   GLUCOSE 88 12/27/2018   ALT 23 12/27/2018   AST 24 12/27/2018   NA 140 12/27/2018   K 4.2 12/27/2018   CL 109 12/27/2018   CREATININE 0.77 12/27/2018   BUN 14 12/27/2018   CO2 22 12/27/2018  INR 1.1 12/27/2018   sTRESS TEST 02/2018  Study Highlights    There was no ST segment deviation noted during stress.  Pt walked for 9:00 of a Bruce protocol GXT. Peak HR of 153 which is 93% predicted max HR  There were no ST or T wave changes to suggest ischemia  Blood pressure demonstrated a hypertensive response to exercise.  No arrhythmias  This is interpreted as a negagative stress test. no evidence of ischemia .     Echo: Transthoracic Echocardiography  Patient:    Shannon, Shannon [name] MR #:       [id number] Study Date: 03/23/2018 Gender:     F Age:        13 Height:     166.4 cm Weight:     86.6 kg BSA:        2.03 m^2 Pt. Status: Room:   Jetty Duhamel, M.D.  REFERRING    Shannon Shannon [name], M.D.  SONOGRAPHER  Shannon, Shannon [name]  ATTENDING    Shannon Shannon [name], M.D.  PERFORMING   Shannon Shannon [name], Shannon Shannon [name]  cc:  ------------------------------------------------------------------- LV EF: 55% -   60%  ------------------------------------------------------------------- Indications:      (R07.9).  ------------------------------------------------------------------- History:   PMH:  Acquired from the patient and from the patient&'s chart.  Chest pain.  Dyspnea.  Risk factors:  Former tobacco use. Dyslipidemia.  ------------------------------------------------------------------- Study Conclusions  - Left ventricle: The cavity size was normal. Wall thickness was   normal. Systolic function was normal. The estimated ejection   fraction was in the range of 55% to 60%. Wall motion was normal;   there were no regional wall motion abnormalities. Doppler   parameters are consistent with abnormal left ventricular   relaxation (grade 1 diastolic dysfunction).  ------------------------------------------------------------------- Study data:  No prior study was available for comparison.  Study status:  Routine.  Procedure:  The patient reported no pain pre or post test. Transthoracic echocardiography for left ventricular function evaluation. Image quality was adequate.  Study completion:  There were no complications.          Transthoracic echocardiography.  M-mode, complete 2D, spectral Doppler, and color Doppler.  Birthdate:  Patient birthdate: 16-Dec-1960.  Age:  Patient is 58 yr old.  Sex:  Gender: female.    BMI: 31.3 kg/m^2.  Blood pressure:     132/82  Patient status:  Shannon Shannon [name].  Study date: Study date: 03/23/2018. Study time: 03:52 PM.  Location:  [facility]  -------------------------------------------------------------------  ------------------------------------------------------------------- Left ventricle:  The cavity size  was normal. Wall thickness was normal. Systolic function was normal. The estimated ejection fraction was in the range of 55% to 60%. Wall motion was normal; there were no regional wall motion abnormalities. Doppler parameters are consistent with abnormal left ventricular relaxation (grade 1 diastolic dysfunction).  ------------------------------------------------------------------- Aortic valve:   Trileaflet; normal thickness leaflets. Mobility was not restricted.  Doppler:  Transvalvular velocity was within the normal range. There was no stenosis. There was no regurgitation.   ------------------------------------------------------------------- Aorta:  Aortic root: The aortic root was normal in size.  ------------------------------------------------------------------- Mitral valve:   Structurally normal valve.   Mobility was not restricted.  Doppler:  Transvalvular velocity was within the normal range. There was no evidence for stenosis. There was no regurgitation.    Valve area by pressure half-time: 2.27 cm^2. Indexed valve area by pressure half-time: 1.12 cm^2/m^2.    Peak gradient (D): 2 mm Hg.  ------------------------------------------------------------------- Left atrium:  The atrium was  normal in size.  ------------------------------------------------------------------- Right ventricle:  The cavity size was normal. Wall thickness was normal. Systolic function was normal.  ------------------------------------------------------------------- Pulmonic valve:    Doppler:  Transvalvular velocity was within the normal range. There was no evidence for stenosis.  ------------------------------------------------------------------- Tricuspid valve:   Structurally normal valve.    Doppler: Transvalvular velocity was within the normal range. There was no regurgitation.  ------------------------------------------------------------------- Pulmonary artery:   The main pulmonary  artery was normal-sized. Systolic pressure was within the normal range.  ------------------------------------------------------------------- Right atrium:  The atrium was normal in size.  ------------------------------------------------------------------- Pericardium:  There was no pericardial effusion.  ------------------------------------------------------------------- Systemic veins: Inferior vena cava: The vessel was normal in size.  ------------------------------------------------------------------- Measurements   Left ventricle                         Value          Reference  LV ID, ED, PLAX chordal        (L)     41    mm       43 - 52  LV ID, ES, PLAX chordal                28    mm       23 - 38  LV fx shortening, PLAX chordal         32    %        >=29  LV PW thickness, ED                    11    mm       ----------  IVS/LV PW ratio, ED                    0.91           <=1.3  Stroke volume, 2D                      79    ml       ----------  Stroke volume/bsa, 2D                  39    ml/m^2   ----------  LV e&', lateral                         9.79  cm/s     ----------  LV E/e&', lateral                       7.63           ----------  LV e&', medial                          8.49  cm/s     ----------  LV E/e&', medial                        8.8            ----------  LV e&', average                         9.14  cm/s     ----------  LV E/e&', average  8.17           ----------    Ventricular septum                     Value          Reference  IVS thickness, ED                      10    mm       ----------    LVOT                                   Value          Reference  LVOT ID, S                             20    mm       ----------  LVOT area                              3.14  cm^2     ----------  LVOT ID                                20    mm       ----------  LVOT peak velocity, S                  132   cm/s     ----------  LVOT  mean velocity, S                  82.5  cm/s     ----------  LVOT VTI, S                            25.3  cm       ----------  LVOT peak gradient, S                  7     mm Hg    ----------  Stroke volume (SV), LVOT DP            79.5  ml       ----------  Stroke index (SV/bsa), LVOT DP         39.1  ml/m^2   ----------    Aorta                                  Value          Reference  Aortic root ID, ED                     29    mm       ----------  Ascending aorta ID, A-P, S             25    mm       ----------    Left atrium                            Value  Reference  LA ID, A-P, ES                         30    mm       ----------  LA ID/bsa, A-P                         1.48  cm/m^2   <=2.2  LA volume, S                           44.3  ml       ----------  LA volume/bsa, S                       21.8  ml/m^2   ----------  LA volume, ES, 1-p A4C                 46.2  ml       ----------  LA volume/bsa, ES, 1-p A4C             22.8  ml/m^2   ----------  LA volume, ES, 1-p A2C                 43.2  ml       ----------  LA volume/bsa, ES, 1-p A2C             21.3  ml/m^2   ----------    Mitral valve                           Value          Reference  Mitral E-wave peak velocity            74.7  cm/s     ----------  Mitral A-wave peak velocity            92.4  cm/s     ----------  Mitral deceleration time       (H)     330   ms       150 - 230  Mitral pressure half-time              97    ms       ----------  Mitral peak gradient, D                2     mm Hg    ----------  Mitral E/A ratio, peak                 0.8            ----------  Mitral valve area, PHT, DP             2.27  cm^2     ----------  Mitral valve area/bsa, PHT, DP         1.12  cm^2/m^2 ----------    Right atrium                           Value          Reference  RA ID, S-I, ES, A4C                    46.6  mm       34 - 49  RA area, ES, A4C  12.1  cm^2     8.3 - 19.5  RA volume,  ES, A/L                     25.8  ml       ----------  RA volume/bsa, ES, A/L                 12.7  ml/m^2   ----------    Systemic veins                         Value          Reference  Estimated CVP                          3     mm Hg    ----------    Right ventricle                        Value          Reference  TAPSE                                  19.1  mm       ----------  RV s&', lateral, S                      10.3  cm/s     ----------  Legend: (L)  and  (H)  mark values outside specified reference range.  ------------------------------------------------------------------- Prepared and Electronically Authenticated by  Shannon Shannon [name], M.D. 2019-08-28T17:05:54  pft'S fev1 2.06 77% dlco 22.37 89% INTERPRETATION: Technique: Good technique. SPIROMETRY: Normal Airflows, no evidence of obstruction Bronchodilator response: No significant response to bronchodilator. DLCO: Normal diffusion capacity. LUNG VOLUMES: Lung volumes demonstrate air trapping  Assessment / Plan:    #1 patient with known rectal carcinoid, submucosal resection without evidence on PET scan of pelvic spread.  There was a single area of uptake in the right hilum questionably associated with a 6 mm lung nodule.   Bronchoscopy reveals a very broad-based external compression after the takeoff of the right middle lobe bronchus.  This area is not been specifically biopsied but correlates with the area of significant uptake on that PET/netspot Ga 68 Dotatate)  Scan.  She has adequate pulmonary function studies and functional status to tolerate a right middle lobectomy.     I discussed with her in detail and a surgical plan, and the need for right middle lobectomy and able to to adequately resect the radiographic evidence of carcinoid tumor.  Risks of surgery were discussed in detail, also the possible need for further lung resection to obtain adequate margin.  She is willing to proceed we have discussed the  need to stay quarantined to cut down on the risk of preoperative COVID exposure.   The goals risks and alternatives of the planned surgical procedure Procedure(s): VIDEO BRONCHOSCOPY (N/A) VIDEO ASSISTED THORACOSCOPY (VATS)/LUNG RESECTION (Right)  have been discussed with the patient in detail. The risks of the procedure including death, infection, stroke, myocardial infarction, bleeding, blood transfusion have all been discussed specifically.  I have quoted Shannon Shannon [name] a 2 % of perioperative mortality and a complication rate as high as 20 %. The patient's questions have been answered.Shannon RETTINGER is willing  to proceed with the planned  procedure.  Shannon Shannon [name]      [employer] [address],[place] [address] Office [phone]   Beeper [phone]  12/30/2018 7:12 AM

## 2018-12-30 NOTE — Anesthesia Postprocedure Evaluation (Signed)
Anesthesia Post Note  Patient: CARYN GIENGER  Procedure(s) Performed: VIDEO BRONCHOSCOPY (N/A ) VIDEO ASSISTED THORACOSCOPY (VATS), MINI THORACOTOMY, RIGHT MIDDLE LOBECTOMY WITH LYMPH NODE DISSECTION (Right Chest)     Patient location during evaluation: PACU Anesthesia Type: General Level of consciousness: sedated Pain management: pain level controlled Vital Signs Assessment: post-procedure vital signs reviewed and stable Respiratory status: spontaneous breathing and respiratory function stable Cardiovascular status: stable Postop Assessment: no apparent nausea or vomiting Anesthetic complications: no    Last Vitals:  Vitals:   12/30/18 0725 12/30/18 1325  BP:  (!) 144/70  Pulse: 62 70  Resp: 11 12  Temp:  36.5 C  SpO2: 99% 100%    Last Pain:  Vitals:   12/30/18 1325  TempSrc:   PainSc: Asleep                 Khang Hannum DANIEL

## 2018-12-31 ENCOUNTER — Encounter (HOSPITAL_COMMUNITY): Payer: Self-pay | Admitting: Cardiothoracic Surgery

## 2018-12-31 ENCOUNTER — Inpatient Hospital Stay (HOSPITAL_COMMUNITY): Payer: BC Managed Care – PPO

## 2018-12-31 LAB — GLUCOSE, CAPILLARY
Glucose-Capillary: 110 mg/dL — ABNORMAL HIGH (ref 70–99)
Glucose-Capillary: 112 mg/dL — ABNORMAL HIGH (ref 70–99)
Glucose-Capillary: 116 mg/dL — ABNORMAL HIGH (ref 70–99)
Glucose-Capillary: 119 mg/dL — ABNORMAL HIGH (ref 70–99)
Glucose-Capillary: 119 mg/dL — ABNORMAL HIGH (ref 70–99)
Glucose-Capillary: 142 mg/dL — ABNORMAL HIGH (ref 70–99)

## 2018-12-31 LAB — CBC
HCT: 40 % (ref 36.0–46.0)
Hemoglobin: 12.8 g/dL (ref 12.0–15.0)
MCH: 28.2 pg (ref 26.0–34.0)
MCHC: 32 g/dL (ref 30.0–36.0)
MCV: 88.1 fL (ref 80.0–100.0)
Platelets: 206 10*3/uL (ref 150–400)
RBC: 4.54 MIL/uL (ref 3.87–5.11)
RDW: 13.3 % (ref 11.5–15.5)
WBC: 10.2 10*3/uL (ref 4.0–10.5)
nRBC: 0 % (ref 0.0–0.2)

## 2018-12-31 LAB — BASIC METABOLIC PANEL
Anion gap: 8 (ref 5–15)
BUN: 9 mg/dL (ref 6–20)
CO2: 26 mmol/L (ref 22–32)
Calcium: 9.1 mg/dL (ref 8.9–10.3)
Chloride: 105 mmol/L (ref 98–111)
Creatinine, Ser: 0.75 mg/dL (ref 0.44–1.00)
GFR calc Af Amer: >60 mL/min (ref >60)
GFR calc non Af Amer: >60 mL/min (ref >60)
Glucose, Bld: 123 mg/dL — ABNORMAL HIGH (ref 70–99)
Potassium: 4 mmol/L (ref 3.5–5.1)
Sodium: 139 mmol/L (ref 135–145)

## 2018-12-31 LAB — BLOOD GAS, ARTERIAL
Acid-Base Excess: 0.6 mmol/L (ref 0.0–2.0)
Bicarbonate: 25.1 mmol/L (ref 20.0–28.0)
Drawn by: 347191
O2 Content: 2 L/min
O2 Saturation: 97.5 %
Patient temperature: 98.6
pCO2 arterial: 43.4 mmHg (ref 32.0–48.0)
pH, Arterial: 7.381 (ref 7.350–7.450)
pO2, Arterial: 97.9 mmHg (ref 83.0–108.0)

## 2018-12-31 MED ORDER — INSULIN ASPART 100 UNIT/ML ~~LOC~~ SOLN: 0.0000 [IU] | Freq: Three times a day (TID) | SUBCUTANEOUS | Status: DC

## 2018-12-31 NOTE — Progress Notes (Signed)
1 Day Post-Op Procedure(s) (LRB): VIDEO BRONCHOSCOPY (N/A) VIDEO ASSISTED THORACOSCOPY (VATS), MINI THORACOTOMY, RIGHT MIDDLE LOBECTOMY WITH LYMPH NODE DISSECTION (Right) Subjective: Some incisional pain  Objective: Vital signs in last 24 hours: Temp:  [97.2 F (36.2 C)-99.4 F (37.4 C)] 98.9 F (37.2 C) (06/06 0755) Pulse Rate:  [69-81] 71 (06/06 0353) Cardiac Rhythm: Normal sinus rhythm (06/06 0700) Resp:  [11-21] 21 (06/06 0840) BP: (129-146)/(61-83) 134/63 (06/06 0353) SpO2:  [92 %-100 %] 97 % (06/06 0840) Arterial Line BP: (133-200)/(72-83) 133/76 (06/06 0005) Weight:  [91.1 kg] 91.1 kg (06/05 1637)  Hemodynamic parameters for last 24 hours:    Intake/Output from previous day: 06/05 0701 - 06/06 0700 In: 3035.9 [P.O.:444; I.V.:2034.9; IV Piggyback:557] Out: 2709 [Urine:2015; Blood:600; Chest Tube:94] Intake/Output this shift: Total I/O In: 118 [P.O.:118] Out: -   General appearance: alert, cooperative and no distress Neurologic: intact Heart: regular rate and rhythm Lungs: clear to auscultation bilaterally no air leak  Lab Results: Recent Labs    12/31/18 0430  WBC 10.2  HGB 12.8  HCT 40.0  PLT 206   BMET:  Recent Labs    12/31/18 0430  NA 139  K 4.0  CL 105  CO2 26  GLUCOSE 123*  BUN 9  CREATININE 0.75  CALCIUM 9.1    PT/INR: No results for input(s): LABPROT, INR in the last 72 hours. ABG    Component Value Date/Time   PHART 7.381 12/31/2018 0535   HCO3 25.1 12/31/2018 0535   O2SAT 97.5 12/31/2018 0535   CBG (last 3)  Recent Labs    12/30/18 2020 12/31/18 0003 12/31/18 0356  GLUCAP 170* 142* 119*    Assessment/Plan: S/P Procedure(s) (LRB): VIDEO BRONCHOSCOPY (N/A) VIDEO ASSISTED THORACOSCOPY (VATS), MINI THORACOTOMY, RIGHT MIDDLE LOBECTOMY WITH LYMPH NODE DISSECTION (Right) -POD # 1 No air leak- CT to water seal Decrease IVF Advance diet SCD + enoxaparin for DVT prophylaxis CBG mildly elevated- change to [facility] and HS [employer]     LOS: 1 day    Melrose Nakayama 12/31/2018

## 2018-12-31 NOTE — Progress Notes (Signed)
Left radial ART line removed today at 0600. Patient tolerated well with no complications.  Pressure held for 10+ minutes and pressure bandage applied. Will continue to monitor the patient.

## 2019-01-01 ENCOUNTER — Inpatient Hospital Stay (HOSPITAL_COMMUNITY): Payer: BC Managed Care – PPO

## 2019-01-01 LAB — COMPREHENSIVE METABOLIC PANEL
ALT: 19 U/L (ref 0–44)
AST: 30 U/L (ref 15–41)
Albumin: 3.5 g/dL (ref 3.5–5.0)
Alkaline Phosphatase: 58 U/L (ref 38–126)
Anion gap: 7 (ref 5–15)
BUN: 8 mg/dL (ref 6–20)
CO2: 29 mmol/L (ref 22–32)
Calcium: 9.5 mg/dL (ref 8.9–10.3)
Chloride: 105 mmol/L (ref 98–111)
Creatinine, Ser: 0.7 mg/dL (ref 0.44–1.00)
GFR calc Af Amer: >60 mL/min (ref >60)
GFR calc non Af Amer: >60 mL/min (ref >60)
Glucose, Bld: 126 mg/dL — ABNORMAL HIGH (ref 70–99)
Potassium: 4.4 mmol/L (ref 3.5–5.1)
Sodium: 141 mmol/L (ref 135–145)
Total Bilirubin: 0.8 mg/dL (ref 0.3–1.2)
Total Protein: 6.1 g/dL — ABNORMAL LOW (ref 6.5–8.1)

## 2019-01-01 LAB — GLUCOSE, CAPILLARY
Glucose-Capillary: 114 mg/dL — ABNORMAL HIGH (ref 70–99)
Glucose-Capillary: 114 mg/dL — ABNORMAL HIGH (ref 70–99)
Glucose-Capillary: 113 mg/dL — ABNORMAL HIGH (ref 70–99)
Glucose-Capillary: 102 mg/dL — ABNORMAL HIGH (ref 70–99)

## 2019-01-01 LAB — CBC
HCT: 42.9 % (ref 36.0–46.0)
Hemoglobin: 13.2 g/dL (ref 12.0–15.0)
MCH: 28.2 pg (ref 26.0–34.0)
MCHC: 30.8 g/dL (ref 30.0–36.0)
MCV: 91.7 fL (ref 80.0–100.0)
Platelets: 211 10*3/uL (ref 150–400)
RBC: 4.68 MIL/uL (ref 3.87–5.11)
RDW: 13.8 % (ref 11.5–15.5)
WBC: 13.2 10*3/uL — ABNORMAL HIGH (ref 4.0–10.5)
nRBC: 0 % (ref 0.0–0.2)

## 2019-01-01 MED ORDER — GUAIFENESIN ER 600 MG PO TB12
1200.0000 mg | ORAL_TABLET | Freq: Two times a day (BID) | ORAL | Status: DC
  Administered 2019-01-01 – 2019-01-04 (×7): 1200 mg via ORAL
  Filled 2019-01-01 (×7): qty 2

## 2019-01-01 MED ORDER — IOHEXOL 350 MG/ML SOLN
75.0000 mL | Freq: Once | INTRAVENOUS | Status: AC | PRN
  Administered 2019-01-01: 100 mL via INTRAVENOUS

## 2019-01-01 NOTE — Progress Notes (Signed)
Around 5am, patient called out SOB, sats 70s on 3L [place].  Oxygen increased to NRB.  Sats 91-93% on NRB.  RT called to bedside. Routine CXR rounding, X ray obtained.  Rapid response notified and came to bedside. Patient has chest tube to right side, left side breath sounds are extremely diminished, possibly absent.  Dr. Roxan Hockey notified, CTA ordered to rule out PE.  Patient states she feels better on NRB.  Upon returning to the room from CT, patient states she feels a lot better.  Switched back to 3L [place].  Patient sats are now 95%. She was able to cough up bloody phlegm. Patient asked RN to call her husband to explain what happened however he did not answer the phone.

## 2019-01-01 NOTE — Progress Notes (Addendum)
Pt called RN at 1300 for SOB. sats in the 80s on 2 L [place]. sats dropped, Pt placed on NRB, right lung sound absent, diminished on left side, RT notified. Neb treatment given, pt still c/p that she can't breathe. Pt had similar episode earlier this morning, all labs ordered were negative. MD notified, advised pt to work on pulmonary hygiene.  Pt felt better an hour later after coughing up bloody phlegm. Switched back to 2L [place] sats 98%. Was able to get pt OOB, ambulate x2 in the hall ~ 350 ft, and sat in the chair to eat supper. RN advised pt to work on her flutter and IS.

## 2019-01-01 NOTE — Significant Event (Addendum)
Rapid Response Event Note  Overview:Called d/t SpO2-70s in 3LNC, pt placed on NRB with SpO2 increasing to 88%. Time Called: 0517 Arrival Time: 0520 Event Type: Respiratory  Initial Focused Assessment: Pt laying in bed, alert and oriented, c/o difficulty breathing and R sided chest pain.  T-97.9, HR-88, BP-143/74, RR-27, SpO2-89% on NRB. Lung sounds not heard on L side and diminished on R side.  R chest tube to water seal, placed back to suction, doesn't appear to be dislodged, no air leak seen.  Interventions: CT back to suction PCXR-Unchanged position of chest tube.  No pneumothorax. BIPAP CTA chest with contrast Plan of Care (if not transferred): CT scan, bipap Event Summary:   at      at     Update: 0630. Pt to CT and back, on return SpO2-100% and pt says she feels better. Pt placed back on 3L [place] with SpO2-95%. Hendrickson at bedside, flutter valve, mucinex ordered.     Dillard Essex

## 2019-01-01 NOTE — Progress Notes (Signed)
2 Days Post-Op Procedure(s) (LRB): VIDEO BRONCHOSCOPY (N/A) VIDEO ASSISTED THORACOSCOPY (VATS), MINI THORACOTOMY, RIGHT MIDDLE LOBECTOMY WITH LYMPH NODE DISSECTION (Right) Subjective: Events of this AM noted. C/o SOB. sats dropped and required NRB. CXR with no obvious cause. Just back from CT angio Feels a little better currently. Not SOB, sats Ok on 3L [place] Small amount of hemoptysis  Objective: Vital signs in last 24 hours: Temp:  [97.9 F (36.6 C)-99.1 F (37.3 C)] 97.9 F (36.6 C) (06/07 0339) Pulse Rate:  [67-83] 83 (06/07 0540) Cardiac Rhythm: Normal sinus rhythm (06/07 0540) Resp:  [17-23] 23 (06/07 0540) BP: (129-143)/(61-80) 143/74 (06/07 0540) SpO2:  [91 %-97 %] 93 % (06/07 0540)  Hemodynamic parameters for last 24 hours:    Intake/Output from previous day: 06/06 0701 - 06/07 0700 In: 2408.5 [P.O.:1078; I.V.:1310.5] Out: 656 [Urine:500; Chest Tube:156] Intake/Output this shift: Total I/O In: 942.8 [P.O.:480; I.V.:442.8; Other:20] Out: 112 [Chest Tube:112]  General appearance: alert, cooperative and no distress Neurologic: intact Heart: regular rate and rhythm Lungs: diminished breath sounds bibasilar no air leak, serous fluid in tube  Lab Results: Recent Labs    12/31/18 0430 01/01/19 0400  WBC 10.2 13.2*  HGB 12.8 13.2  HCT 40.0 42.9  PLT 206 211   BMET:  Recent Labs    12/31/18 0430 01/01/19 0400  NA 139 141  K 4.0 4.4  CL 105 105  CO2 26 29  GLUCOSE 123* 126*  BUN 9 8  CREATININE 0.75 0.70  CALCIUM 9.1 9.5    PT/INR: No results for input(s): LABPROT, INR in the last 72 hours. ABG    Component Value Date/Time   PHART 7.381 12/31/2018 0535   HCO3 25.1 12/31/2018 0535   O2SAT 97.5 12/31/2018 0535   CBG (last 3)  Recent Labs    12/31/18 1633 12/31/18 2115 12/31/18 2133  GLUCAP 110* 116* 119*    Assessment/Plan: S/P Procedure(s) (LRB): VIDEO BRONCHOSCOPY (N/A) VIDEO ASSISTED THORACOSCOPY (VATS), MINI THORACOTOMY, RIGHT MIDDLE  LOBECTOMY WITH LYMPH NODE DISSECTION (Right) -POD # 2 right middle lobectomy Ct angio result pending but no major PE, small anterior pneumothorax, no effusion, bibasilar atelectasis Poor cough- needs to work on pulmonary hygiene Will add flutter and mucinex, continue IS Leave CT on water seal Ambulate    LOS: 2 days    Melrose Nakayama 01/01/2019

## 2019-01-02 ENCOUNTER — Inpatient Hospital Stay (HOSPITAL_COMMUNITY): Payer: BC Managed Care – PPO

## 2019-01-02 LAB — GLUCOSE, CAPILLARY
Glucose-Capillary: 95 mg/dL (ref 70–99)
Glucose-Capillary: 93 mg/dL (ref 70–99)
Glucose-Capillary: 78 mg/dL (ref 70–99)
Glucose-Capillary: 84 mg/dL (ref 70–99)

## 2019-01-02 LAB — BASIC METABOLIC PANEL
Anion gap: 7 (ref 5–15)
BUN: 12 mg/dL (ref 6–20)
CO2: 29 mmol/L (ref 22–32)
Calcium: 9.8 mg/dL (ref 8.9–10.3)
Chloride: 102 mmol/L (ref 98–111)
Creatinine, Ser: 0.73 mg/dL (ref 0.44–1.00)
GFR calc Af Amer: >60 mL/min (ref >60)
GFR calc non Af Amer: >60 mL/min (ref >60)
Glucose, Bld: 109 mg/dL — ABNORMAL HIGH (ref 70–99)
Potassium: 4.2 mmol/L (ref 3.5–5.1)
Sodium: 138 mmol/L (ref 135–145)

## 2019-01-02 LAB — CBC
HCT: 41.7 % (ref 36.0–46.0)
Hemoglobin: 12.9 g/dL (ref 12.0–15.0)
MCH: 28.4 pg (ref 26.0–34.0)
MCHC: 30.9 g/dL (ref 30.0–36.0)
MCV: 91.9 fL (ref 80.0–100.0)
Platelets: 208 10*3/uL (ref 150–400)
RBC: 4.54 MIL/uL (ref 3.87–5.11)
RDW: 13.5 % (ref 11.5–15.5)
WBC: 10.2 10*3/uL (ref 4.0–10.5)
nRBC: 0 % (ref 0.0–0.2)

## 2019-01-02 NOTE — Op Note (Signed)
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  POSTOPERATIVE DIAGNOSIS:  Carcinoid right middle lobe.   PROCEDURE PERFORMED:   1.  Video bronchoscopy. 2.  Right video-assisted thoracoscopy. 3.  Mini thoracotomy.   4.  Right middle lobectomy with primary hand sewn suture closure of the middle lobe bronchus.  SURGEON:  Lanelle Bal, MD  FIRST ASSISTANT:  Nicholes Rough PA.  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  A Pertechnetate scan was performed which demonstrated an isolated area of uptake near the takeoff of the right middle lobe bronchus.  Previously,  the patient had undergone bronchoscopy, which showed some narrowing and diffuse submucosal area just distal to the takeoff of the right middle lobe.  Initially, this had been suggested to be endobronchial by CT scan, although, there was no definite  endobronchial component.  Because of the submucosal component, we then recommended to the patient that likely she would require right middle lobectomy.  This was just as the [place] pandemic started, so we delayed primary surgery on her until this point.   Risks and options were discussed in detail and she was agreeable and signed informed consent.  DESCRIPTION OF PROCEDURE:  The patient underwent general endotracheal anesthesia, initially with a single Shannon [name] endotracheal tube.  Repeat bronchoscopy was performed to the subsegmental level both right and left tracheobronchial tree.  There were no  changes other than what had been seen on a previous bronchoscopy just after the takeoff of the middle lobe bronchus.  There was a broad-based submucosal thickening.  The scope was removed.  A double Shannon [name]  endotracheal tube was replaced.  The patient was  turned in lateral decubitus position with the right side up.  The right side had preoperatively been marked.  The right chest was prepped with Betadine, draped in a sterile manner.  A second timeout was performed.  We then proceeded with a small port  incision approximately the 4th intercostal space.  At this point,  the lung was still not completely isolated.  With further evaluation by anesthesia, we were able to isolate the right lung.  The minor or major fissure were fairly well complete.  With  the videoscope and through a second port enlarging the initial port, we were able to visualize and dissect out the fissure, isolating the 2 branches to the right middle lobe.  At this point, as we manipulated the pulmonary artery branches, a significant  nodal mass was noted right adjacent to the takeoff of the middle lobe.  This was dissected off the bronchus and pulmonary artery and submitted to pathology.  Frozen section showed no evidence of carcinoid and normal benign lymph node.  To confirm the  location, Dr. Roxan Hockey came in the room and passed a 1.8 mm bronchoscope to the takeoff of the right middle lobe.  With the removal of the lymph node, there was no change in the submucosal area and at this point, we decided to continue with right  middle lobectomy  knowing that this margin would be closed and a stapler on the bronchus would not be satisfactory.  We then divided the pulmonary artery branches to the middle lobe with a vascular stapler.  A portion of the vein supplying the middle  lobe was  also divided with a vascular stapler.  We then divided the middle lobe bronchus leaving a small cuff of bronchus.  It was in this cuff of bronchus that the area of suspicion was noted.  Additional incision of the bronchial stump was done and the  specimen submitted to pathology.  To confirm a negative bronchial margin,  an additional portion of the bronchus was  excised in the area in question.  The final margin was inked and sent to Pathology for frozen section.  This portion labeled final  bronchial margin was reported as negative for tumor.  We then closed the bronchial stump with interrupted 4-0 Prolene sutures.  Progel was placed on the bronchial stump.  The bronchial stump was tested for any air leaks and it was absent.  The remainder  of the lung in the fissure was divided with a gold-load stapler and the specimen removed through the utilitarian incision.  Additional 10R, 11R and 12R lymph nodes were submitted to Pathology.  A single Blake chest tube was left in place through the port  site.  The incision was closed with interrupted 0 Vicryl, running 3-0 Vicryl, subcutaneous tissue, 4-0 subcuticular stitch in skin edges.  At the start of the procedure, after we have the scope in the chest as pleural, Nupercaine solution was then  injected with a spinal needle along each of the intercostal spaces just above and below our incisions and port sites.  Estimated blood loss was less than 100 mL.    Sponge and needle count was reported as correct at completion of the procedure.    RF tag scanning reported clear code.    The patient was awakened in the operating room and extubated.  She was then transferred to the recovery room in good condition.  AN/NUANCE  D:01/01/2019 T:01/02/2019 JOB:[id number]

## 2019-01-02 NOTE — Progress Notes (Signed)
Patient resting comfortably on 3L [place], vitals are stable. BIPAP not needed. Will continue to monitor patient.

## 2019-01-02 NOTE — Plan of Care (Signed)
Patient is progressing toward all care goals.  Right CT is now to water seal w/o air leak; serosanguinous drainage noted; right upper chest incision is C/D/I.  Patient is using flutter valve regularly independently.  Patient requires a fair bit of encouragement to increase activity tolerance.  She complains of minimal pain but states she has frequent muscle spasms to her right mid back, requesting heat packs for comfort.  However, this "spasmic" pain is reduced when she sits upright as in bedside chair or ambulates to the bathroom.  Patient is encouraged to increase activity as tolerated.  Patient plans to return home w/husband.  Will continue to encourage aggressive pulmonary toileting and increased activity tolerance.

## 2019-01-02 NOTE — Progress Notes (Signed)
Wasted fentanyl 15 ml witness by [employer]. HS [employer]

## 2019-01-02 NOTE — Progress Notes (Addendum)
      [address]       [place] [address]             [phone]      3 Days Post-Op Procedure(s) (LRB): VIDEO BRONCHOSCOPY (N/A) VIDEO ASSISTED THORACOSCOPY (VATS), MINI THORACOTOMY, RIGHT MIDDLE LOBECTOMY WITH LYMPH NODE DISSECTION (Right) Subjective: Feels okay this morning. Asking when she can go home  Objective: Vital signs in last 24 hours: Temp:  [97.6 F (36.4 C)-98.6 F (37 C)] 98.2 F (36.8 C) (06/08 0753) Pulse Rate:  [67-81] 76 (06/08 0753) Cardiac Rhythm: Normal sinus rhythm (06/08 0753) Resp:  [12-26] 12 (06/08 0756) BP: (122-134)/(45-89) 134/45 (06/08 0352) SpO2:  [85 %-99 %] 98 % (06/08 0819)     Intake/Output from previous day: 06/07 0701 - 06/08 0700 In: 454.3 [P.O.:240; I.V.:214.3] Out: 1100 [Urine:1100] Intake/Output this shift: No intake/output data recorded.  General appearance: alert, cooperative and no distress Heart: regular rate and rhythm, S1, S2 normal, no murmur, click, rub or gallop Lungs: clear to auscultation bilaterally Abdomen: soft, non-tender; bowel sounds normal; no masses,  no organomegaly Extremities: extremities normal, atraumatic, no cyanosis or edema Wound: clean and dry  Lab Results: Recent Labs    01/01/19 0400 01/02/19 0425  WBC 13.2* 10.2  HGB 13.2 12.9  HCT 42.9 41.7  PLT 211 208   BMET:  Recent Labs    01/01/19 0400 01/02/19 0425  NA 141 138  K 4.4 4.2  CL 105 102  CO2 29 29  GLUCOSE 126* 109*  BUN 8 12  CREATININE 0.70 0.73  CALCIUM 9.5 9.8    PT/INR: No results for input(s): LABPROT, INR in the last 72 hours. ABG    Component Value Date/Time   PHART 7.381 12/31/2018 0535   HCO3 25.1 12/31/2018 0535   O2SAT 97.5 12/31/2018 0535   CBG (last 3)  Recent Labs    01/01/19 1550 01/01/19 2151 01/02/19 0618  GLUCAP 113* 102* 95    Assessment/Plan: S/P Procedure(s) (LRB): VIDEO BRONCHOSCOPY (N/A) VIDEO ASSISTED THORACOSCOPY (VATS), MINI THORACOTOMY, RIGHT MIDDLE LOBECTOMY  WITH LYMPH NODE DISSECTION (Right)  1. CV-HR in the 70s, BP well controlled 2. Chest tubes- no air leak and CXR stable. Will remove chest tube today. Also remove PCA 3. Pulm- weaning oxygen as able. Currently on 2L [place].  4. Renal-0.73 creatinine, electrolytes stable 5. Endo-BP well controlled.   Plan: remove chest tube today and PCA. Ambulate in the halls. Wean oxygen as able. Encouraged incentive spirometer.    LOS: 3 days    Elgie Collard 01/02/2019  Breathing better today Ct out Path pending  I have seen and examined Shannon [name] and agree with the above assessment  and plan.  Grace Isaac MD Beeper [phone] Office [phone] 01/02/2019 4:15 PM

## 2019-01-02 NOTE — Plan of Care (Signed)

## 2019-01-03 ENCOUNTER — Inpatient Hospital Stay (HOSPITAL_COMMUNITY): Payer: BC Managed Care – PPO

## 2019-01-03 LAB — GLUCOSE, CAPILLARY
Glucose-Capillary: 84 mg/dL (ref 70–99)
Glucose-Capillary: 86 mg/dL (ref 70–99)
Glucose-Capillary: 79 mg/dL (ref 70–99)
Glucose-Capillary: 103 mg/dL — ABNORMAL HIGH (ref 70–99)

## 2019-01-03 MED ORDER — POLYETHYLENE GLYCOL 3350 17 G PO PACK
17.0000 g | PACK | Freq: Every day | ORAL | Status: DC
  Administered 2019-01-03: 17 g via ORAL
  Filled 2019-01-03: qty 1

## 2019-01-03 NOTE — Progress Notes (Signed)
Patient resting comfortably, no BIPAP needed at this time. Will continue to monitor patient

## 2019-01-03 NOTE — Progress Notes (Addendum)
[address]       [place] [address]             [phone]      4 Days Post-Op Procedure(s) (LRB): VIDEO BRONCHOSCOPY (N/A) VIDEO ASSISTED THORACOSCOPY (VATS), MINI THORACOTOMY, RIGHT MIDDLE LOBECTOMY WITH LYMPH NODE DISSECTION (Right) Subjective: She feels okay this morning. Occassionally dizzy when getting up or finishing a walk.   Objective: Vital signs in last 24 hours: Temp:  [98 F (36.7 C)-98.8 F (37.1 C)] 98 F (36.7 C) (06/09 0811) Pulse Rate:  [65-94] 72 (06/09 0811) Cardiac Rhythm: Normal sinus rhythm (06/09 0700) Resp:  [14-17] 17 (06/09 0811) BP: (95-142)/(59-73) 135/59 (06/09 0811) SpO2:  [92 %-99 %] 92 % (06/09 0811)     Intake/Output from previous day: 06/08 0701 - 06/09 0700 In: 172.5 [I.V.:172.5] Out: 1 [Urine:1] Intake/Output this shift: Total I/O In: 240 [P.O.:240] Out: -   General appearance: alert, cooperative and no distress Heart: regular rate and rhythm, S1, S2 normal, no murmur, click, rub or gallop Lungs: clear to auscultation bilaterally Abdomen: soft, non-tender; bowel sounds normal; no masses,  no organomegaly Extremities: right hand edema Wound: clean and dry  Lab Results: Recent Labs    01/01/19 0400 01/02/19 0425  WBC 13.2* 10.2  HGB 13.2 12.9  HCT 42.9 41.7  PLT 211 208   BMET:  Recent Labs    01/01/19 0400 01/02/19 0425  NA 141 138  K 4.4 4.2  CL 105 102  CO2 29 29  GLUCOSE 126* 109*  BUN 8 12  CREATININE 0.70 0.73  CALCIUM 9.5 9.8    PT/INR: No results for input(s): LABPROT, INR in the last 72 hours. ABG    Component Value Date/Time   PHART 7.381 12/31/2018 0535   HCO3 25.1 12/31/2018 0535   O2SAT 97.5 12/31/2018 0535   CBG (last 3)  Recent Labs    01/02/19 1549 01/02/19 2056 01/03/19 0645  GLUCAP 78 84 84    Assessment/Plan: S/P Procedure(s) (LRB): VIDEO BRONCHOSCOPY (N/A) VIDEO ASSISTED THORACOSCOPY (VATS), MINI THORACOTOMY, RIGHT MIDDLE LOBECTOMY WITH LYMPH NODE  DISSECTION (Right)  1. CV- hemodynamically stable and in NSR. BP well controlled. 2. Pulm-tolerating room air with good oxygen saturation. She is using her incentive spirometer 3. Renal-creatinine has been stable, no new labs today 4. H and H stable.  5. Endo-blood glucose well controlled. 6. Dizziness-probably some orthostatic hypotension or dehydration. Encouraged water intake today.  7. Right hand swelling due to IV infiltrate. IV was removed. Apply warm compresses and keep elevated.    Plan: Home tomorrow if things go well today. Patient has not had a bowel movement yet but working on it today. Encouraged ambulation.    LOS: 4 days    Elgie Collard 01/03/2019  Dg Chest 2 View  Result Date: 01/03/2019 CLINICAL DATA:  Chest tube removal; history of lung and rectal cancer, emphysema, pneumonia EXAM: CHEST - 2 VIEW COMPARISON:  01/02/2019 FINDINGS: Interval removal of RIGHT thoracostomy tube. LEFT jugular central venous catheter with tip projecting over SVC. Normal heart size and mediastinal contours. Significant RIGHT basilar atelectasis. Air-fluid level at the anterior inferior RIGHT hemithorax compatible with a loculated hydropneumothorax. Bibasilar pleural effusions and atelectasis. Upper lungs clear. IMPRESSION: Bibasilar effusions and atelectasis. Loculated hydropneumothorax at the anterior RIGHT lung base post chest tube removal. Findings called to OR nurse, 01/03/2019 at 0845 hours (provider in OR); she will relay the findings to ordering provider. Electronically Signed   By:  Lavonia Dana M.D.   On: 01/03/2019 08:50  I have independently reviewed the above radiology studies  and reviewed the findings with the patient.  Films reviewed.  Plan home tomorrow after follow up film I have seen and examined Sherlene Shams and agree with the above assessment  and plan.  Grace Isaac MD Beeper [phone] Office [phone] 01/03/2019 3:18 PM

## 2019-01-04 ENCOUNTER — Inpatient Hospital Stay (HOSPITAL_COMMUNITY): Payer: BC Managed Care – PPO

## 2019-01-04 LAB — GLUCOSE, CAPILLARY: Glucose-Capillary: 100 mg/dL — ABNORMAL HIGH (ref 70–99)

## 2019-01-04 MED ORDER — ACETAMINOPHEN 500 MG PO TABS: 1000.0000 mg | ORAL_TABLET | Freq: Four times a day (QID) | ORAL | 0 refills | Status: DC

## 2019-01-04 MED ORDER — OXYCODONE HCL 5 MG PO TABS: 5.0000 mg | ORAL_TABLET | Freq: Four times a day (QID) | ORAL | 0 refills | Status: DC | PRN

## 2019-01-04 NOTE — Discharge Summary (Addendum)
Physician Discharge Summary  Patient ID: Shannon [name] MRN: [id number] DOB/AGE: Jan 14, 1961 58 y.o.  Admit date: 12/30/2018 Discharge date: 01/04/2019  Admission Diagnoses:  Patient Active Problem List   Diagnosis Date Noted  . 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 07/07/2018    Discharge Diagnoses:  Active Problems:   S/P lobectomy of lung   Discharged Condition: good  HPI:   Shannon [name] 58 y.o. 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  She was originally seen for abnormal PET/ Ga 68 DOTATATE scan , a follow-up CT of the chest to compare to the one she had in August 2019.  the patient had undergone her first routine screening colonoscopy and was found to have a  submucosal mass was found in the posterior rectum approximately 3 to 4 cm from the anal verge. The mass was circular and well-circumscribed with a small central umbilication. IIt diameter measured 12-15 mm.  EUS with submucosal resection was performed July 18, 2018.  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 - LYMPHOVASCULAR INVASION IS IDENTIFIED. - TUMOR IS FOCALLY PRESENT AT THE CAUTERIZED TISSUE EDGE Ki-67 is approximately 1% in the tumor cells   A PEPET/ Ga 68 DOTATATETscan was performed January 22 with a small focus of uptake in the right hilum.  Patient was referred to thoracic surgery for further recommendations.  Patient is a former smoker up to a pack a day for 20 years but quit has been smoke-free for the last 12 years.  She has a history of asthma as a child and currently uses inhalers as needed  Hospital Course:  On 12/30/2018 Ms. Shannon [name] underwent a right video-assisted fluoroscopy,  minithoracotomy, and right middle lobectomy with lymph node dissection by Dr. Servando Snare.  She tolerated the procedure well and was transferred to [facility] for continued care.  She was extubated in a timely manner.  Her chest tubes were removed on 01/02/2019.  We also removed her PCA at this time.  She remained on 2 L nasal cannula for oxygen support which we weaned as able.  She was encouraged to use her incentive spirometer and ambulate around the unit.  Postop day 4 she did have some occasional dizziness when first getting up out of bed or finishing walk.  This improved with hydration and working on her exercise stamina.  She did have an IV infiltrate on her right hand which remains edematous.  Warm compresses were applied and the area was elevated.  Pathology showed Typical Carcinoid Tumor. She was tolerating room air, her incisions were healing well, and she was ready for discharge.  Consults: None  Significant Diagnostic Studies:   CLINICAL DATA:  Chest tube removal; history of lung and rectal cancer, emphysema, pneumonia  EXAM: CHEST - 2 VIEW  COMPARISON:  01/02/2019  FINDINGS: Interval removal of RIGHT thoracostomy tube.  LEFT jugular central venous catheter with tip projecting over SVC.  Normal heart size and mediastinal contours.  Significant RIGHT basilar atelectasis.  Air-fluid level at the anterior inferior RIGHT hemithorax compatible with a loculated hydropneumothorax.  Bibasilar pleural effusions and atelectasis.  Upper lungs clear.  IMPRESSION: Bibasilar effusions and atelectasis.  Loculated hydropneumothorax at the anterior RIGHT lung base post chest tube removal.  Findings called to  OR nurse, 01/03/2019 at 0845 hours (provider in OR); she will relay the findings to ordering provider.   Electronically Signed   By: Lavonia Dana M.D.   On: 01/03/2019 08:50  Treatments:   NAME: RENESMAY, NESBITT MEDICAL RECORD [id number] ACCOUNT [id number] DATE  OF BIRTH:[birth year] FACILITY: [facility] LOCATION: [facility] PHYSICIAN:EDWARD Maryruth Bun, MD  OPERATIVE REPORT  DATE OF PROCEDURE:  12/30/2018  PREOPERATIVE DIAGNOSIS:  Probable carcinoid right middle lobe.  POSTOPERATIVE DIAGNOSIS:  Carcinoid right middle lobe.   PROCEDURE PERFORMED:   1.  Video bronchoscopy. 2.  Right video-assisted thoracoscopy. 3.  Mini thoracotomy.   4.  Right middle lobectomy with primary suture closure of the middle lobe bronchus.  SURGEON:  Lanelle Bal, MD  FIRST ASSISTANT:  Nicholes Rough PA.  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  A Pertechnetate scan was performed which demonstrated an isolated area of uptake near the takeoff of the right middle lobe bronchus.  Previously,  the patient had undergone bronchoscopy, which showed some narrowing and diffuse submucosal area just distal to the takeoff of the right middle lobe.  Initially, this had been suggested to be endobronchial by CT scan, although, there was no definite  endobronchial component.  Because of the submucosal component, we then recommended to the patient that likely she would require right middle lobectomy.  This was just as the [place] pandemic started, so we delayed primary surgery on her until this point.   Risks and options were discussed in detail and she was agreeable and signed informed consent.  Discharge Exam: Blood pressure 140/63, pulse 72, temperature 98.9 F (37.2 C), temperature source Oral, resp. rate (!) 24, height _0  (1.651 m), weight 91.1 kg, SpO2 99 %.   General appearance: alert, cooperative and no distress Heart: regular rate and rhythm, S1, S2 normal, no murmur, click, rub or gallop Lungs: clear to auscultation bilaterally Abdomen: soft, non-tender; bowel sounds normal; no masses,  no organomegaly Extremities: extremities normal, atraumatic, no cyanosis or edema Wound: clean and dry  Disposition:  Discharge disposition: 01-Home or Self Care        Allergies as of 01/04/2019      Reactions   Hydrocodone Nausea Only   "terrible headache"   Other    UNSPECIFIED REACTION  Dog Dander    Levaquin [levofloxacin In D5w] Nausea And Vomiting         Medication List    STOP taking these medications   LORazepam 0.5 MG tablet Commonly known as:  ATIVAN   meloxicam 7.5 MG tablet Commonly known as:  MOBIC   methocarbamol 500 MG tablet Commonly known as:  ROBAXIN     TAKE these medications   acetaminophen 500 MG tablet Commonly known as:  TYLENOL Take 2 tablets (1,000 mg total) by mouth every 6 (six) hours.   albuterol 108 (90 Base) MCG/ACT inhaler Commonly known as:  VENTOLIN HFA Inhale 2 puffs into the lungs every 6 (six) hours as needed for wheezing or shortness of breath.   atorvastatin 20 MG tablet Commonly known as:  LIPITOR Take 20 mg by mouth every evening.   B-12 2500 MCG Tabs Take 2,500 mcg by mouth daily.   busPIRone 10 MG tablet Commonly known as:  BUSPAR Take 5 mg by mouth 2 (two) times daily.   calcium carbonate 500 MG chewable tablet Commonly known as:  TUMS - dosed in mg elemental calcium Chew 1-2 tablets by mouth  daily as needed for indigestion or heartburn.   fluticasone 50 MCG/ACT nasal spray Commonly known as:  FLONASE Place 1 spray into both nostrils daily as needed for allergies or rhinitis.   ibuprofen 200 MG tablet Commonly known as:  ADVIL Take 400 mg by mouth daily as needed for headache or moderate pain.   multivitamin with minerals Tabs tablet Take 1 tablet by mouth daily.   oxyCODONE 5 MG immediate release tablet Commonly known as:  Oxy IR/ROXICODONE Take 1 tablet (5 mg total) by mouth every 6 (six) hours as needed for severe pain.      Follow-up Information    Cox, Kirsten, MD. Call in 1 day(s).   Specialties:  Family Medicine, Interventional Cardiology, Radiology, Anesthesiology Contact information: [address] [place] [address] [phone]        Grace Isaac, MD Follow up.   Specialty:  Cardiothoracic Surgery Why:  Your routine follow-up appointment is on 01/26/2019 at 10:00am. Please arrive at 9:30am for a chest xray located at [facility] which is on the first floor of our building.  Contact information: [address] [place] [address] [phone]        nursing sutre removal appointment and chest xray Follow up.   Why:  Your suture removal appointment is on June 17th at 10:30am. Please arrive at 10:00am for a chest xray at [facility] which is on the first floor of our building.  Contact information: Dr. Everrett Coombe office and [facility].           Signed: Elgie Collard 01/04/2019, 1:15 PM

## 2019-01-04 NOTE — Progress Notes (Signed)
      [address]       [place] [address]             [phone]      5 Days Post-Op Procedure(s) (LRB): VIDEO BRONCHOSCOPY (N/A) VIDEO ASSISTED THORACOSCOPY (VATS), MINI THORACOTOMY, RIGHT MIDDLE LOBECTOMY WITH LYMPH NODE DISSECTION (Right) Subjective: No issues overnight. Feels good this morning.   Objective: Vital signs in last 24 hours: Temp:  [98 F (36.7 C)-98.9 F (37.2 C)] 98.9 F (37.2 C) (06/10 0332) Pulse Rate:  [68-82] 68 (06/10 0332) Cardiac Rhythm: Normal sinus rhythm (06/10 0400) Resp:  [17-25] 17 (06/10 0332) BP: (135-150)/(59-80) 140/71 (06/10 0332) SpO2:  [92 %-99 %] 99 % (06/10 0728)     Intake/Output from previous day: 06/09 0701 - 06/10 0700 In: 240 [P.O.:240] Out: -  Intake/Output this shift: No intake/output data recorded.  General appearance: alert, cooperative and no distress Heart: regular rate and rhythm, S1, S2 normal, no murmur, click, rub or gallop Lungs: clear to auscultation bilaterally Abdomen: soft, non-tender; bowel sounds normal; no masses,  no organomegaly Extremities: extremities normal, atraumatic, no cyanosis or edema Wound: clean and dry  Lab Results: Recent Labs    01/02/19 0425  WBC 10.2  HGB 12.9  HCT 41.7  PLT 208   BMET:  Recent Labs    01/02/19 0425  NA 138  K 4.2  CL 102  CO2 29  GLUCOSE 109*  BUN 12  CREATININE 0.73  CALCIUM 9.8    PT/INR: No results for input(s): LABPROT, INR in the last 72 hours. ABG    Component Value Date/Time   PHART 7.381 12/31/2018 0535   HCO3 25.1 12/31/2018 0535   O2SAT 97.5 12/31/2018 0535   CBG (last 3)  Recent Labs    01/03/19 1628 01/03/19 2140 01/04/19 0614  GLUCAP 79 103* 100*    Assessment/Plan: S/P Procedure(s) (LRB): VIDEO BRONCHOSCOPY (N/A) VIDEO ASSISTED THORACOSCOPY (VATS), MINI THORACOTOMY, RIGHT MIDDLE LOBECTOMY WITH LYMPH NODE DISSECTION (Right)  1. CV- hemodynamically stable and in NSR. BP well controlled. 2.  Pulm-tolerating room air with good oxygen saturation. She is using her incentive spirometer 3. Renal-creatinine has been stable, no new labs today 4. H and H stable.  5. Endo-blood glucose well controlled. 6. Dizziness-Resolved 7. Right hand swelling due to IV infiltrate. IV was removed. Apply warm compresses and keep elevated  Plan: Discharge today. Went over instructions. All questions answered.    LOS: 5 days    Elgie Collard 01/04/2019

## 2019-01-04 NOTE — Discharge Instructions (Signed)

## 2019-01-04 NOTE — Plan of Care (Signed)
  Problem: Education: Goal: Knowledge of General Education information will improve Description Including pain rating scale, medication(s)/side effects and non-pharmacologic comfort measures Outcome: Progressing   Problem: Clinical Measurements: Goal: Ability to maintain clinical measurements within normal limits will improve Outcome: Progressing Goal: Will remain free from infection Outcome: Progressing Goal: Diagnostic test results will improve Outcome: Progressing Goal: Respiratory complications will improve Outcome: Progressing Goal: Cardiovascular complication will be avoided Outcome: Progressing   Problem: Activity: Goal: Risk for activity intolerance will decrease Outcome: Progressing   Problem: Nutrition: Goal: Adequate nutrition will be maintained Outcome: Progressing   Problem: Coping: Goal: Level of anxiety will decrease Outcome: Progressing   Problem: Elimination: Goal: Will not experience complications related to bowel motility Outcome: Progressing Goal: Will not experience complications related to urinary retention Outcome: Progressing   Problem: Pain Managment: Goal: General experience of comfort will improve Outcome: Progressing   Problem: Safety: Goal: Ability to remain free from injury will improve Outcome: Progressing   Problem: Skin Integrity: Goal: Risk for impaired skin integrity will decrease Outcome: Progressing   Problem: Education: Goal: Knowledge of disease or condition will improve Outcome: Progressing Goal: Knowledge of the prescribed therapeutic regimen will improve Outcome: Progressing   Problem: Activity: Goal: Risk for activity intolerance will decrease Outcome: Progressing   Problem: Cardiac: Goal: Will achieve and/or maintain hemodynamic stability Outcome: Progressing   Problem: Clinical Measurements: Goal: Postoperative complications will be avoided or minimized Outcome: Progressing   Problem: Respiratory: Goal:  Respiratory status will improve Outcome: Progressing   Problem: Pain Management: Goal: Pain level will decrease Outcome: Progressing   Problem: Skin Integrity: Goal: Wound healing without signs and symptoms infection will improve Outcome: Progressing

## 2019-01-05 ENCOUNTER — Other Ambulatory Visit: Payer: Self-pay | Admitting: *Deleted

## 2019-01-05 NOTE — Progress Notes (Signed)
The proposed treatment discussed in cancer conference 01/05/2019 is for discussion purpose only and is not a binding recommendation.  The patient was not physically examined nor present for their treatment options.  Therefore, final treatment plans cannot be decided.

## 2019-01-10 ENCOUNTER — Other Ambulatory Visit: Payer: Self-pay

## 2019-01-10 ENCOUNTER — Other Ambulatory Visit: Payer: Self-pay | Admitting: Cardiothoracic Surgery

## 2019-01-10 DIAGNOSIS — Z902 Acquired absence of lung [part of]: Secondary | ICD-10-CM

## 2019-01-11 ENCOUNTER — Encounter (INDEPENDENT_AMBULATORY_CARE_PROVIDER_SITE_OTHER): Payer: Self-pay | Admitting: *Deleted

## 2019-01-11 ENCOUNTER — Ambulatory Visit
Admission: RE | Admit: 2019-01-11 | Discharge: 2019-01-11 | Disposition: A | Discharge status: Home / Self Care | Payer: BC Managed Care – PPO | Source: Ambulatory Visit | Attending: Cardiothoracic Surgery | Admitting: Cardiothoracic Surgery

## 2019-01-11 DIAGNOSIS — Z902 Acquired absence of lung [part of]: Secondary | ICD-10-CM

## 2019-01-11 DIAGNOSIS — Z4802 Encounter for removal of sutures: Secondary | ICD-10-CM

## 2019-01-25 ENCOUNTER — Other Ambulatory Visit: Payer: Self-pay

## 2019-01-25 ENCOUNTER — Other Ambulatory Visit: Payer: Self-pay | Admitting: Cardiothoracic Surgery

## 2019-01-25 DIAGNOSIS — C7A090 Malignant carcinoid tumor of the bronchus and lung: Secondary | ICD-10-CM

## 2019-01-25 NOTE — Progress Notes (Signed)
[address]       [place] [address]             [phone]      Caryssa L Tilmon Malone Medical Record #[id number] Date of Birth: [birth year]  Referring: Irving Copas.* Primary Care: Rochel Brome, MD Primary Cardiologist: No primary care provider on file.   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 POSTOPERATIVE DIAGNOSIS:  Carcinoid right middle lobe.   PROCEDURE PERFORMED:   1.  Video bronchoscopy. 2.  Right video-assisted thoracoscopy. 3.  Mini thoracotomy.   4.  Right middle lobectomy with primary hand sewn suture closure of the middle lobe bronchus.  Cancer Staging No matching staging information was found for the patient.  History of Present Illness:     Patient's primary postop complaint has been itching, she only took 1 pain pill since discharge, has had no medications changed recently.  She is ambulating well without shortness of breath.     Past Medical History:  Diagnosis Date  . Anxiety   . Arthritis   . Asthma   . Cancer ([place])    rectal, Lung  . Dyspnea    with exertion  . Emphysema of lung ([place])    patient denies  . History of kidney stones    passed  . HLD (hyperlipidemia)   . Pneumonia 02/2018  . Rectal tumor      Social History   Tobacco Use  Smoking Status Former Smoker  . Years: 33.00  . Quit date: 2008  . Years since quitting: 12.5  Smokeless Tobacco Never Used    Social History   Substance and Sexual Activity  Alcohol Use Not Currently   Comment: occ- rare      Allergies  Allergen Reactions  . Hydrocodone Nausea Only    "terrible headache"  . Other     UNSPECIFIED REACTION  Dog Dander   . Levaquin [Levofloxacin In D5w] Nausea And Vomiting         Current Outpatient Medications  Medication Sig Dispense Refill  . acetaminophen (TYLENOL) 500 MG tablet Take 2 tablets  (1,000 mg total) by mouth every 6 (six) hours. 30 tablet 0  . albuterol (PROVENTIL HFA;VENTOLIN HFA) 108 (90 Base) MCG/ACT inhaler Inhale 2 puffs into the lungs every 6 (six) hours as needed for wheezing or shortness of breath.    Marland Kitchen atorvastatin (LIPITOR) 20 MG tablet Take 20 mg by mouth every evening.     . busPIRone (BUSPAR) 10 MG tablet Take 5 mg by mouth 2 (two) times daily.     . calcium carbonate (TUMS - DOSED IN MG ELEMENTAL CALCIUM) 500 MG chewable tablet Chew 1-2 tablets by mouth daily as needed for indigestion or heartburn.    . Cyanocobalamin (B-12) 2500 MCG TABS Take 2,500 mcg by mouth daily.    . diphenhydrAMINE (BENADRYL ALLERGY) 25 mg capsule Take 25 mg by mouth every 6 (six) hours as needed for itching.    . fluticasone (FLONASE) 50 MCG/ACT nasal spray Place 1 spray into both nostrils daily as needed for allergies or rhinitis.    Marland Kitchen ibuprofen (ADVIL,MOTRIN) 200 MG tablet Take 400 mg by mouth daily as needed for headache or moderate pain.     . Multiple Vitamin (MULTIVITAMIN WITH MINERALS) TABS tablet Take 1 tablet by mouth daily.     No current facility-administered medications for this  visit.        Physical Exam: BP 140/62   Pulse 77   Temp 97.7 F (36.5 C) (Skin)   Resp 20   Ht 5\' 5"  (1.651 m)   Wt 190 lb (86.2 kg)   SpO2 97% Comment: RA  BMI 31.62 kg/m   General appearance: alert and cooperative Neurologic: intact Heart: regular rate and rhythm, S1, S2 normal, no murmur, click, rub or gallop Lungs: clear to auscultation bilaterally Abdomen: soft, non-tender; bowel sounds normal; no masses,  no organomegaly Extremities: extremities normal, atraumatic, no cyanosis or edema and Homans sign is negative, no sign of DVT Wound: Patient's chest tube sites incisions are well-healed She does have a very fine rash on her back very subtle.,  None associated with her incision   Diagnostic Studies & Laboratory data:     Recent Radiology Findings:   Dg Chest 2 View   Result Date: 01/26/2019 CLINICAL DATA:  History of vats procedure and right middle lobe lobectomy 12/30/2018 EXAM: CHEST - 2 VIEW COMPARISON:  01/11/2019 FINDINGS: Stable prominent right heart border and eventration of the right hemidiaphragm due to prior right middle lobe lobectomy. Linear band of scarring change in the right mid lung is stable. The left lung is clear. Small bilateral pleural effusions persist. IMPRESSION: Stable postoperative changes.  No acute pulmonary findings. Persistent small bilateral pleural effusions. Electronically Signed   By: Marijo Sanes M.D.   On: 01/26/2019 09:47  I have independently reviewed the above radiology studies  and reviewed the findings with the patient.      Recent Lab Findings: Lab Results  Component Value Date   WBC 10.2 01/02/2019   HGB 12.9 01/02/2019   HCT 41.7 01/02/2019   PLT 208 01/02/2019   GLUCOSE 109 (H) 01/02/2019   ALT 19 01/01/2019   AST 30 01/01/2019   NA 138 01/02/2019   K 4.2 01/02/2019   CL 102 01/02/2019   CREATININE 0.73 01/02/2019   BUN 12 01/02/2019   CO2 29 01/02/2019   INR 1.1 12/27/2018      Assessment / Plan:   #1 doing well following right middle lobectomy for incidental finding of bronchial carcinoid #2 history of GI carcinoid  Plan see the patient back in 2 months with a follow-up chest x-ray, she will contact tacked her primary care doctor for further evaluation of itching, she notes that there has been little improvement with Benadryl.  Medication Changes: No orders of the defined types were placed in this encounter.     Grace Isaac MD      [place] [place] [address] Office [phone]   Beeper [phone]  01/26/2019 10:42 AM

## 2019-01-26 ENCOUNTER — Ambulatory Visit (INDEPENDENT_AMBULATORY_CARE_PROVIDER_SITE_OTHER): Payer: Self-pay | Admitting: Cardiothoracic Surgery

## 2019-01-26 ENCOUNTER — Ambulatory Visit
Admission: RE | Admit: 2019-01-26 | Discharge: 2019-01-26 | Disposition: A | Discharge status: Home / Self Care | Payer: BC Managed Care – PPO | Source: Ambulatory Visit | Attending: Cardiothoracic Surgery | Admitting: Cardiothoracic Surgery

## 2019-01-26 VITALS — BP 140/62 | HR 77 | Temp 97.7°F | Resp 20 | Ht 65.0 in | Wt 190.0 lb

## 2019-01-26 DIAGNOSIS — Z902 Acquired absence of lung [part of]: Secondary | ICD-10-CM

## 2019-01-26 DIAGNOSIS — C7A090 Malignant carcinoid tumor of the bronchus and lung: Secondary | ICD-10-CM

## 2019-02-20 ENCOUNTER — Telehealth: Payer: Self-pay | Admitting: Gastroenterology

## 2019-02-20 NOTE — Telephone Encounter (Signed)
The pt has been advised that I have mailed her a copy of her path report as requested

## 2019-03-21 ENCOUNTER — Encounter: Payer: Self-pay | Admitting: Gastroenterology

## 2019-03-21 ENCOUNTER — Ambulatory Visit (INDEPENDENT_AMBULATORY_CARE_PROVIDER_SITE_OTHER): Payer: BC Managed Care – PPO | Admitting: Gastroenterology

## 2019-03-21 VITALS — BP 162/100 | HR 72 | Temp 97.8°F | Ht 64.0 in | Wt 191.5 lb

## 2019-03-21 DIAGNOSIS — D3A090 Benign carcinoid tumor of the bronchus and lung: Secondary | ICD-10-CM | POA: Insufficient documentation

## 2019-03-21 DIAGNOSIS — C7A026 Malignant carcinoid tumor of the rectum: Secondary | ICD-10-CM

## 2019-03-21 NOTE — Progress Notes (Signed)
[place] VISIT   Primary Care Provider Rochel Brome, MD [address] [place] [address] [phone]  Patient Profile: Shannon [name] is a 58 y.o. female with a pmh significant for asthma, anxiety, hyperlipidemia, colon polyps, rectal carcinoid, bronchial carcinoid (s/p resection).  The patient presents to the [facility] for an evaluation and management of problem(s) noted below:  Problem List 1. 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 Bronchial carcinoid tumors     History of Present Illness Please see prior notes for full details of HPI.  Interval History The patient has done well since my last evaluation of her for her last rectal endoscopic ultrasound with EMR.  Thankfully, at the time of that procedure her scar site showed no evidence of recurrent disease, suggesting that the previous resection was complete however because of the alvei that was noted there we had move forward with procedures and imaging that found the bronchial carcinoid.  She is subsequently already gone through treatment for that with Dr. Servando Snare.  She is scheduled to see him in follow-up and will dictate follow-up needs from him at time of next visit.  She is currently without any other significant GI symptoms and just happy that we have been able to help her as best we can with the findings over the course of this last year.  GI Review of Systems Positive as above Negative for dysphagia, odynophagia, change in bowel habits, melena, hematochezia, abdominal pain   Review of Systems General: Denies fevers/chills/weight loss Cardiovascular: Denies chest pain Pulmonary: Denies shortness of breath Gastroenterological: See HPI Genitourinary: Denies darkened urine Hematological: Denies easy bruising/bleeding Dermatological: Denies jaundice Psychological: Mood is stable   Medications Current Outpatient Medications  Medication Sig  Dispense Refill  . albuterol (PROVENTIL HFA;VENTOLIN HFA) 108 (90 Base) MCG/ACT inhaler Inhale 2 puffs into the lungs every 6 (six) hours as needed for wheezing or shortness of breath.    Marland Kitchen atorvastatin (LIPITOR) 20 MG tablet Take 20 mg by mouth every evening.     . busPIRone (BUSPAR) 10 MG tablet Take 5 mg by mouth 2 (two) times daily.     . calcium carbonate (TUMS - DOSED IN MG ELEMENTAL CALCIUM) 500 MG chewable tablet Chew 1-2 tablets by mouth daily as needed for indigestion or heartburn.    . Cyanocobalamin (B-12) 2500 MCG TABS Take 2,500 mcg by mouth daily.    . fluticasone (FLONASE) 50 MCG/ACT nasal spray Place 1 spray into both nostrils daily as needed for allergies or rhinitis.    Marland Kitchen ibuprofen (ADVIL,MOTRIN) 200 MG tablet Take 400 mg by mouth daily as needed for headache or moderate pain.     Marland Kitchen LORazepam (ATIVAN) 0.5 MG tablet Take 1 tablet by mouth 2 (two) times daily as needed.    . Multiple Vitamin (MULTIVITAMIN WITH MINERALS) TABS tablet Take 1 tablet by mouth daily.     No current facility-administered medications for this visit.     Allergies Allergies  Allergen Reactions  . Hydrocodone Nausea Only    "terrible headache"  . Other     UNSPECIFIED REACTION  Dog Dander   . Levaquin [Levofloxacin In D5w] Nausea And Vomiting         Histories Past Medical History:  Diagnosis Date  . Anxiety   . Arthritis   . Asthma   . Cancer ([place])    rectal, Lung  . Dyspnea    with exertion  . Emphysema of lung ([place])  patient denies  . History of kidney stones    passed  . HLD (hyperlipidemia)   . Pneumonia 02/2018  . Rectal tumor    Past Surgical History:  Procedure Laterality Date  . ABLATION     endometrial  . BIOPSY  10/10/2018   Procedure: BIOPSY;  Surgeon: Rush Landmark Telford Nab., MD;  Location: [facility];  Service: Gastroenterology;;  . COLONOSCOPY    . DOPPLER ECHOCARDIOGRAPHY    . ENDOSCOPIC MUCOSAL RESECTION N/A 10/10/2018   Procedure: ENDOSCOPIC MUCOSAL  RESECTION;  Surgeon: Rush Landmark Telford Nab., MD;  Location: [facility];  Service: Gastroenterology;  Laterality: N/A;  . EUS N/A 07/18/2018   Procedure: LOWER ENDOSCOPIC ULTRASOUND (EUS);  Surgeon: Irving Copas., MD;  Location: [facility];  Service: Gastroenterology;  Laterality: N/A;  . EUS N/A 10/10/2018   Procedure: LOWER ENDOSCOPIC ULTRASOUND (EUS);  Surgeon: Irving Copas., MD;  Location: [facility];  Service: Gastroenterology;  Laterality: N/A;  . FLEXIBLE SIGMOIDOSCOPY N/A 07/18/2018   Procedure: FLEXIBLE SIGMOIDOSCOPY;  Surgeon: Irving Copas., MD;  Location: [facility];  Service: Gastroenterology;  Laterality: N/A;  . FLEXIBLE SIGMOIDOSCOPY N/A 10/10/2018   Procedure: FLEXIBLE SIGMOIDOSCOPY;  Surgeon: Rush Landmark Telford Nab., MD;  Location: [facility];  Service: Gastroenterology;  Laterality: N/A;  . HEMOSTASIS CLIP PLACEMENT  10/10/2018   Procedure: HEMOSTASIS CLIP PLACEMENT;  Surgeon: Irving Copas., MD;  Location: [facility];  Service: Gastroenterology;;  . Lia Foyer LIFTING INJECTION  10/10/2018   Procedure: SUBMUCOSAL LIFTING INJECTION;  Surgeon: Irving Copas., MD;  Location: [facility];  Service: Gastroenterology;;  . TEE WITHOUT CARDIOVERSION  02/2018  . TONSILLECTOMY    . TUBAL LIGATION  1987  . VIDEO ASSISTED THORACOSCOPY (VATS)/WEDGE RESECTION Right 12/30/2018   Procedure: VIDEO ASSISTED THORACOSCOPY (VATS), MINI THORACOTOMY, RIGHT MIDDLE LOBECTOMY WITH LYMPH NODE DISSECTION;  Surgeon: Grace Isaac, MD;  Location: [facility];  Service: Thoracic;  Laterality: Right;  Marland Kitchen VIDEO BRONCHOSCOPY N/A 10/12/2018   Procedure: VIDEO BRONCHOSCOPY;  Surgeon: Grace Isaac, MD;  Location: [facility];  Service: Thoracic;  Laterality: N/A;  . VIDEO BRONCHOSCOPY N/A 12/30/2018   Procedure: VIDEO BRONCHOSCOPY;  Surgeon: Grace Isaac, MD;  Location: [facility];  Service: Thoracic;  Laterality: N/A;   Social History   Socioeconomic History   . Marital status: Married    Spouse name: Not on file  . Number of children: Not on file  . Years of education: Not on file  . Highest education level: Not on file  Occupational History  . Not on file  Social Needs  . Financial resource strain: Not on file  . Food insecurity    Worry: Not on file    Inability: Not on file  . Transportation needs    Medical: Not on file    Non-medical: Not on file  Tobacco Use  . Smoking status: Former Smoker    Years: 33.00    Quit date: 2008    Years since quitting: 12.6  . Smokeless tobacco: Never Used  Substance and Sexual Activity  . Alcohol use: Not Currently    Comment: occ- rare   . Drug use: Not Currently  . Sexual activity: Not Currently    Birth control/protection: None  Lifestyle  . Physical activity    Days per week: Not on file    Minutes per session: Not on file  . Stress: Not on file  Relationships  . Social Herbalist on phone: Not on file    Gets  together: Not on file    Attends religious service: Not on file    Active member of club or organization: Not on file    Attends meetings of clubs or organizations: Not on file    Relationship status: Not on file  . Intimate partner violence    Fear of current or ex partner: Not on file    Emotionally abused: Not on file    Physically abused: Not on file    Forced sexual activity: Not on file  Other Topics Concern  . Not on file  Social History Narrative  . Not on file   Family History  Problem Relation Age of Onset  . Heart disease Mother   . COPD Mother   . Emphysema Mother   . Diabetes Father   . Colon cancer Neg Hx   . Colon polyps Neg Hx   . Esophageal cancer Neg Hx   . Rectal cancer Neg Hx   . Stomach cancer Neg Hx   I have reviewed her medical, social, and family history in detail and updated the electronic medical record as necessary.    PHYSICAL EXAMINATION  BP (!) 162/100   Pulse 72   Temp 97.8 F (36.6 C)   Ht 5\' 4"  (1.626 m)  Comment: height measured without shoes  Wt 191 lb 8 oz (86.9 kg)   BMI 32.87 kg/m  Wt Readings from Last 3 Encounters:  03/21/19 191 lb 8 oz (86.9 kg)  01/26/19 190 lb (86.2 kg)  12/30/18 200 lb 13.4 oz (91.1 kg)  GEN: NAD, appears stated age, doesn't appear chronically ill PSYCH: Cooperative, without pressured speech EYE: Conjunctivae pink, sclerae anicteric ENT: MMM CV: RR without R/Gs  RESP: CTAB posteriorly, without wheezing GI: NABS, soft, NT/ND, without rebound or guarding, no HSM appreciated MSK/EXT: No lower extremity edema SKIN: No jaundice NEURO:  Alert & Oriented x 3, no focal deficits   REVIEW OF DATA  I reviewed the following data at the time of this encounter:  GI Procedures and Studies  March 2020 rectal EUS Flexible Sigmoidoscopy Impression: - Diverticulosis in the sigmoid colon and in the descending colon. - Patchy mucosal changes of apthous erosions were found in the rectum, in the rectosigmoid colon and in the sigmoid colon. Biopsied to rule out chronic colitis - likely preparation artifact. - Post mucosectomy scar in the mid rectum. After EUS, mucosal resection performed. Clips (MR conditional) were placed. - Non-bleeding non-thrombosed external and internal hemorrhoids. EUS Impression: - Endosonographic imaging showed no sign of significant pathology at the rectosigmoid junction and in the sigmoid colon. - Local wall thickening was visualized endosonographically in the rectum. This was due to increased thickness of the muscularis propria (Layer 4) and this is at the scar site. - No malignant-appearing lymph nodes were visualized endosonographically in the perirectal region and in the left iliac region. - The internal anal sphincter was visualized endosonographically and appeared normal.  Laboratory Studies  Reviewed in epic  Imaging Studies  June 2020 CT angio chest IMPRESSION: 1. No evidence for acute pulmonary embolus. 2. Patient status post  interval right middle lobectomy. Postsurgical changes within the residual paramediastinal right lung. Medial right upper lobe consolidation may represent postsurgical changes. Superimposed infection not excluded. 3. Right chest tube in place.  Small anterior right pneumothorax. 4. Dependent debris within the posterior tracheal air column and left lung bronchus as can be seen with secretions/mucus plugging. 5. Prominent to mildly enlarged prevascular lymph node. This may be reactive in  etiology. Recommend attention on follow-up.  January 2020 dotatate scan IMPRESSION: 1. No evidence residual neuroendocrine tumor within the rectum 2. No metastatic neuroendocrine tumor adenopathy in the abdomen or pelvis. 3. A single focus of intense uptake about the RIGHT hilum which localizes to a small pulmonary nodule. 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 Recommend follow-up CT in 3 to 6 months to demonstrate stability. I suspect this lesion would be difficult to Resect.   ASSESSMENT  Ms. Carrell is a 58 y.o. female with a pmh significant for asthma, anxiety, hyperlipidemia, colon polyps, rectal carcinoid, bronchial carcinoid (s/p resection).  The patient is seen today for evaluation and management of:  1. 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 Bronchial carcinoid tumors    Overall, the patient is doing extremely well.  She is hemodynamically and clinically stable.  If the patient did not have evidence on dotatate scan of her bronchial carcinoid that was found, I would normally have done a repeat endoscopic ultrasound in approximately 1 year after the follow-up rectal EUS.  At some point I would have obtained imaging studies to ensure that lymph node disease had not been found.  Because this lesion was larger than a centimeter in size she is in a gray zone in regards to whether and what her risk of recurrent diseases.  Because of the lymphovascular invasion that was noted even though  we have not found any recurrent disease in our recent endoscopic ultrasound I do think that she warrants long-term surveillance and follow-up.  That from my accord would normally be cross-sectional imaging every so often and rectal ultrasounds over the course of the coming years.  However, because of the finding of the bronchial carcinoid I think a modified follow-up may be helpful.  I will send my note to thoracic surgery, Dr. Servando Snare so that we can discuss timing of his follow-up and how he would like to present things.  I suspect we may want to get a dotatate scan and a 3 to 19-month interval and we can decide upon the timing of that.  If no evidence of disease then hopefully we can do our rectal ultrasounds and any other additional imaging periodically over the course the next few years.  I also discussed referring the patient to oncology so that we can also have a general point person as to who will be following her in the long-term, for now she wants to hold on that but if we think it will be helpful for her she is up for that referral if needed.  I will relay my note to Dr. Servando Snare and we will decide upon follow-up imaging and ultrasound imaging thereafter.  All patient questions were answered, to the best of my ability, and the patient agrees to the aforementioned plan of action with follow-up as indicated.   PLAN  Laboratories as outlined below Rectal endoscopic ultrasound in October or November We will discuss case with thoracic surgery and decide on dotatate scan and follow-up from their record and decide upon our overall surveillance We will consider oncology referral in future   Orders Placed This Encounter  Procedures  . Chromogranin A  . CBC  . Basic Metabolic Panel (BMET)  . INR/PT    New Prescriptions   No medications on file   Modified Medications   No medications on file    Planned Follow Up: No follow-ups on file.   [name], MD [facility]  Advanced Endoscopy Office # [phone]

## 2019-03-21 NOTE — Patient Instructions (Signed)
It has been recommended to you by your physician that you have a(n) EUS in OCT/NOV 2020 completed.We did not schedule the procedure(s) today. We will contact you at a later time to schedule this.   Your provider has requested that you go to the basement level for lab work 1-2 weeks prior to EUS. Press "B" on the elevator. The lab is located at the first door on the left as you exit the elevator.  If you are age 58 or younger, your body mass index should be between 19-25. Your Body mass index is 32.87 kg/m. If this is out of the aformentioned range listed, please consider follow up with your Primary Care Provider.   Thank you for choosing me and [facility].  Dr. Rush Landmark

## 2019-03-24 ENCOUNTER — Encounter: Payer: Self-pay | Admitting: Gastroenterology

## 2019-03-30 ENCOUNTER — Other Ambulatory Visit: Payer: Self-pay | Admitting: *Deleted

## 2019-03-30 NOTE — Progress Notes (Signed)
The proposed treatment discussed in cancer conference 03/30/19 is for discussion purpose only and is not a binding recommendation.  The patient was not physically examined nor present for their treatment options.  Therefore, final treatment plans cannot be decided.

## 2019-04-13 ENCOUNTER — Encounter: Payer: Self-pay | Admitting: Cardiothoracic Surgery

## 2019-04-13 ENCOUNTER — Other Ambulatory Visit: Payer: Self-pay | Admitting: Cardiothoracic Surgery

## 2019-04-13 ENCOUNTER — Ambulatory Visit (INDEPENDENT_AMBULATORY_CARE_PROVIDER_SITE_OTHER): Payer: BC Managed Care – PPO | Admitting: Cardiothoracic Surgery

## 2019-04-13 ENCOUNTER — Other Ambulatory Visit: Payer: Self-pay

## 2019-04-13 ENCOUNTER — Ambulatory Visit
Admission: RE | Admit: 2019-04-13 | Discharge: 2019-04-13 | Disposition: A | Discharge status: Home / Self Care | Payer: BC Managed Care – PPO | Source: Ambulatory Visit | Attending: Cardiothoracic Surgery | Admitting: Cardiothoracic Surgery

## 2019-04-13 VITALS — BP 149/87 | HR 76 | Temp 96.3°F | Resp 16 | Ht 64.0 in | Wt 190.0 lb

## 2019-04-13 DIAGNOSIS — C7A090 Malignant carcinoid tumor of the bronchus and lung: Secondary | ICD-10-CM | POA: Diagnosis not present

## 2019-04-13 DIAGNOSIS — Z902 Acquired absence of lung [part of]: Secondary | ICD-10-CM

## 2019-04-13 NOTE — Progress Notes (Signed)
[address]       ,[place] [address]             [phone]      Alonni L Shadix Boutte Medical Record #[id number] Date of Birth: 1960/10/27  Referring: Irving Copas.* Primary Care: Rochel Brome, MD Primary Cardiologist: No primary care provider on file.   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 POSTOPERATIVE DIAGNOSIS:  Carcinoid right middle lobe.   PROCEDURE PERFORMED:   1.  Video bronchoscopy. 2.  Right video-assisted thoracoscopy. 3.  Mini thoracotomy.   4.  Right middle lobectomy with primary hand sewn suture closure of the middle lobe bronchus.  Cancer Staging No matching staging information was found for the patient.  History of Present Illness:     Patient returns to the office today in follow-up after resection of the right middle lobe carcinoid on December 30, 2018.  She notes she feels about 90% back to full activity.  Denies any hemoptysis.  Notes that flushing that she had had previously has resolved, she was unsure if this was menopausal symptoms.    Past Medical History:  Diagnosis Date  . Anxiety   . Arthritis   . Asthma   . Cancer ([place])    rectal, Lung  . Dyspnea    with exertion  . Emphysema of lung ([place])    patient denies  . History of kidney stones    passed  . HLD (hyperlipidemia)   . Pneumonia 02/2018  . Rectal tumor      Social History   Tobacco Use  Smoking Status Former Smoker  . Years: 33.00  . Quit date: 2008  . Years since quitting: 12.7  Smokeless Tobacco Never Used    Social History   Substance and Sexual Activity  Alcohol Use Not Currently   Comment: occ- rare      Allergies  Allergen Reactions  . Hydrocodone Nausea Only    "terrible headache"  . Other     UNSPECIFIED REACTION  Dog Dander   . Levaquin [Levofloxacin In D5w] Nausea And Vomiting         Current  Outpatient Medications  Medication Sig Dispense Refill  . albuterol (PROVENTIL HFA;VENTOLIN HFA) 108 (90 Base) MCG/ACT inhaler Inhale 2 puffs into the lungs every 6 (six) hours as needed for wheezing or shortness of breath.    Marland Kitchen atorvastatin (LIPITOR) 20 MG tablet Take 20 mg by mouth every evening.     . busPIRone (BUSPAR) 10 MG tablet Take 5 mg by mouth 2 (two) times daily.     . calcium carbonate (TUMS - DOSED IN MG ELEMENTAL CALCIUM) 500 MG chewable tablet Chew 1-2 tablets by mouth daily as needed for indigestion or heartburn.    . Cyanocobalamin (B-12) 2500 MCG TABS Take 2,500 mcg by mouth daily.    . fluticasone (FLONASE) 50 MCG/ACT nasal spray Place 1 spray into both nostrils daily as needed for allergies or rhinitis.    Marland Kitchen ibuprofen (ADVIL,MOTRIN) 200 MG tablet Take 400 mg by mouth daily as needed for headache or moderate pain.     Marland Kitchen LORazepam (ATIVAN) 0.5 MG tablet Take 1 tablet by mouth 2 (two) times daily as needed.    . Multiple Vitamin (MULTIVITAMIN WITH MINERALS) TABS tablet Take 1 tablet by mouth daily.     No current facility-administered medications for this visit.  Physical Exam: BP (!) 149/87 (BP Location: Left Arm, Patient Position: Sitting, Cuff Size: Normal)   Pulse 76   Temp (!) 96.3 F (35.7 C)   Resp 16   Ht 5\' 4"  (1.626 m)   Wt 190 lb (86.2 kg)   SpO2 98% Comment: RA  BMI 32.61 kg/m   General appearance: alert, cooperative and no distress Head: Normocephalic, without obvious abnormality, atraumatic Neck: no adenopathy, no carotid bruit, no JVD, supple, symmetrical, trachea midline and thyroid not enlarged, symmetric, no tenderness/mass/nodules Lymph nodes: Cervical, supraclavicular, and axillary nodes normal. Resp: clear to auscultation bilaterally Cardio: regular rate and rhythm, S1, S2 normal, no murmur, click, rub or gallop GI: soft, non-tender; bowel sounds normal; no masses,  no organomegaly Extremities: extremities normal, atraumatic, no  cyanosis or edema and Homans sign is negative, no sign of DVT Neurologic: Grossly normal   Diagnostic Studies & Laboratory data:     Recent Radiology Findings:   Dg Chest 2 View  Result Date: 04/13/2019 CLINICAL DATA:  58 year old female with right-sided lobectomy. EXAM: CHEST - 2 VIEW COMPARISON:  Chest radiograph dated 01/26/2019 FINDINGS: Postsurgical changes of right middle lobectomy with surgical sutures over the right hilum. Interval resolution the previously seen linear atelectasis in the right mid lung field. No consolidative changes. There is no pleural effusion pneumothorax. The cardiac silhouette is within normal limits. No acute osseous pathology. IMPRESSION: No active cardiopulmonary disease. Electronically Signed   By: Anner Crete M.D.   On: 04/13/2019 16:10  I have independently reviewed the above radiology studies  and reviewed the findings with the patient.      Recent Lab Findings: Lab Results  Component Value Date   WBC 10.2 01/02/2019   HGB 12.9 01/02/2019   HCT 41.7 01/02/2019   PLT 208 01/02/2019   GLUCOSE 109 (H) 01/02/2019   ALT 19 01/01/2019   AST 30 01/01/2019   NA 138 01/02/2019   K 4.2 01/02/2019   CL 102 01/02/2019   CREATININE 0.73 01/02/2019   BUN 12 01/02/2019   CO2 29 01/02/2019   INR 1.1 12/27/2018      Assessment / Plan:   #1 doing well following right middle lobectomy for incidental finding of bronchial carcinoid-plan follow-up CT of the chest in December 2020 #2 history of GI carcinoid-for repeat colonoscopy with ultrasound in November   Medication Changes: No orders of the defined types were placed in this encounter.     Grace Isaac MD      [address] [place] [address] Office [phone]   [place] [phone]  04/14/2019 8:07 AM

## 2019-04-14 ENCOUNTER — Encounter: Payer: Self-pay | Admitting: Cardiothoracic Surgery

## 2019-04-20 ENCOUNTER — Ambulatory Visit: Payer: BC Managed Care – PPO | Admitting: Cardiothoracic Surgery

## 2019-05-01 ENCOUNTER — Telehealth: Payer: Self-pay | Admitting: Gastroenterology

## 2019-05-01 NOTE — Telephone Encounter (Signed)
Pt called looking to schedule an egd. Per Dr. Donneta Romberg notes pt needs an EUS. Could you please clarify this with pt?

## 2019-05-09 ENCOUNTER — Other Ambulatory Visit: Payer: Self-pay

## 2019-05-09 DIAGNOSIS — C7A026 Malignant carcinoid tumor of the rectum: Secondary | ICD-10-CM

## 2019-05-09 NOTE — Telephone Encounter (Signed)
Left message on machine to call back  

## 2019-05-09 NOTE — Telephone Encounter (Signed)
The pt has been scheduled for a lower EUS on 11/16 and COVID testing on 11/12.  Pt has been instructed and also sent to the pt home address.

## 2019-05-10 ENCOUNTER — Telehealth: Payer: Self-pay | Admitting: Gastroenterology

## 2019-05-10 NOTE — Telephone Encounter (Signed)
Pt needs to r/s Covid test, she states that Thursdays mornings do not work for her, she would like either 11/11 pr 11/14 in the afternoon. Pls call her.

## 2019-05-10 NOTE — Telephone Encounter (Signed)
The pt was advised that she has to have the COVID testing the day scheduled to get the results back in time for her case.  She states she will go as planned.

## 2019-05-24 ENCOUNTER — Other Ambulatory Visit: Payer: Self-pay

## 2019-05-24 ENCOUNTER — Ambulatory Visit (INDEPENDENT_AMBULATORY_CARE_PROVIDER_SITE_OTHER): Payer: BC Managed Care – PPO | Admitting: Podiatry

## 2019-05-24 ENCOUNTER — Ambulatory Visit (INDEPENDENT_AMBULATORY_CARE_PROVIDER_SITE_OTHER): Payer: BC Managed Care – PPO

## 2019-05-24 DIAGNOSIS — M778 Other enthesopathies, not elsewhere classified: Secondary | ICD-10-CM | POA: Diagnosis not present

## 2019-05-24 DIAGNOSIS — B351 Tinea unguium: Secondary | ICD-10-CM

## 2019-05-24 DIAGNOSIS — M79672 Pain in left foot: Secondary | ICD-10-CM

## 2019-05-29 NOTE — Progress Notes (Signed)
   Subjective: 58 y.o. female presenting today as a new patient with a chief complaint of thickening and discoloration of the left 2nd toenail that began about one month ago. She was diagnosed with nail fungus several years ago but has not had any treatment for the symptoms. She has been applying witch hazel at home. She denies modifying factors. Patient is here for further evaluation and treatment.   Past Medical History:  Diagnosis Date  . Anxiety   . Arthritis   . Asthma   . Cancer ([place])    rectal, Lung  . Dyspnea    with exertion  . Emphysema of lung ([place])    patient denies  . History of kidney stones    passed  . HLD (hyperlipidemia)   . Pneumonia 02/2018  . Rectal tumor     Objective: Physical Exam General: The patient is alert and oriented x3 in no acute distress.  Dermatology: Hyperkeratotic, discolored, thickened, onychodystrophy noted to the left 2nd toenail. Skin is warm, dry and supple bilateral lower extremities. Negative for open lesions or macerations.  Vascular: Palpable pedal pulses bilaterally. No edema or erythema noted. Capillary refill within normal limits.  Neurological: Epicritic and protective threshold grossly intact bilaterally.   Musculoskeletal Exam: Range of motion within normal limits to all pedal and ankle joints bilateral. Muscle strength 5/5 in all groups bilateral.   Radiographic Exam:  Normal osseous mineralization. Joint spaces preserved. No fracture/dislocation/boney destruction.    Assessment: #1 Onychomycosis left 2nd toenail #2 Hyperkeratotic nails left 2nd toenail  Plan of Care:  #1 Patient was evaluated. X-Rays reviewed.  #2 Mechanical debridement of the left 2nd toenail performed using a nail nipper. Filed with dremel without incident.  #3 Discussed different treatment options.  #4 Patient opts to not have any treatment for the fungus at this time since it does not bother her.  #5 Return to clinic as needed.     Edrick Kins, DPM [facility]  Dr. Edrick Kins, [address]                                        [place], [place] [address]                Office [phone]  Fax [phone]

## 2019-05-30 ENCOUNTER — Other Ambulatory Visit: Payer: Self-pay | Admitting: Podiatry

## 2019-05-30 DIAGNOSIS — M778 Other enthesopathies, not elsewhere classified: Secondary | ICD-10-CM

## 2019-06-08 ENCOUNTER — Other Ambulatory Visit (HOSPITAL_COMMUNITY)
Admission: RE | Admit: 2019-06-08 | Discharge: 2019-06-08 | Disposition: A | Discharge status: Home / Self Care | Payer: BC Managed Care – PPO | Source: Ambulatory Visit | Attending: Gastroenterology | Admitting: Gastroenterology

## 2019-06-08 DIAGNOSIS — Z20828 Contact with and (suspected) exposure to other viral communicable diseases: Secondary | ICD-10-CM | POA: Diagnosis not present

## 2019-06-08 DIAGNOSIS — Z01812 Encounter for preprocedural laboratory examination: Secondary | ICD-10-CM | POA: Insufficient documentation

## 2019-06-09 ENCOUNTER — Encounter (HOSPITAL_COMMUNITY): Payer: Self-pay | Admitting: *Deleted

## 2019-06-09 LAB — NOVEL CORONAVIRUS, NAA (HOSP ORDER, SEND-OUT TO REF LAB; TAT 18-24 HRS): SARS-CoV-2, NAA: NOT DETECTED

## 2019-06-09 NOTE — Progress Notes (Signed)
Patient denies shortness of breath, fever, cough and chest pain.  PCP -  Dr Rochel Brome Cardiologist - Dr Johnsie Cancel Cardiothoracic - Dr Servando Snare  Pulmonologist - Dr Valeta Harms  Chest x-ray - 04/13/19 EKG - 12/27/18 Stress Test - 03/23/18 ECHO - 03/23/18 Cardiac Cath - denies  Anesthesia review: No  STOP now taking any Aspirin (unless otherwise instructed by your surgeon), Aleve, Naproxen, Ibuprofen, Motrin, Advil, Goody's, BC's, all herbal medications, fish oil, and all vitamins.   Coronavirus Screening Covid test on 06/09/19 was negative.  Patient verbalized understanding of instructions that were given via phone.

## 2019-06-12 ENCOUNTER — Encounter (HOSPITAL_COMMUNITY): Payer: Self-pay | Admitting: Gastroenterology

## 2019-06-12 ENCOUNTER — Ambulatory Visit (HOSPITAL_COMMUNITY): Payer: BC Managed Care – PPO | Admitting: Certified Registered Nurse Anesthetist

## 2019-06-12 ENCOUNTER — Ambulatory Visit (HOSPITAL_COMMUNITY)
Admission: RE | Admit: 2019-06-12 | Discharge: 2019-06-12 | Disposition: A | Discharge status: Home / Self Care | Payer: BC Managed Care – PPO | Attending: Gastroenterology | Admitting: Gastroenterology

## 2019-06-12 ENCOUNTER — Other Ambulatory Visit: Payer: Self-pay

## 2019-06-12 ENCOUNTER — Encounter (HOSPITAL_COMMUNITY)
Admission: RE | Disposition: A | Discharge status: Home / Self Care | Payer: Self-pay | Source: Home / Self Care | Attending: Gastroenterology

## 2019-06-12 DIAGNOSIS — Z9889 Other specified postprocedural states: Secondary | ICD-10-CM | POA: Diagnosis not present

## 2019-06-12 DIAGNOSIS — Z85118 Personal history of other malignant neoplasm of bronchus and lung: Secondary | ICD-10-CM | POA: Diagnosis not present

## 2019-06-12 DIAGNOSIS — K642 Third degree hemorrhoids: Secondary | ICD-10-CM | POA: Insufficient documentation

## 2019-06-12 DIAGNOSIS — E785 Hyperlipidemia, unspecified: Secondary | ICD-10-CM | POA: Insufficient documentation

## 2019-06-12 DIAGNOSIS — J45909 Unspecified asthma, uncomplicated: Secondary | ICD-10-CM | POA: Insufficient documentation

## 2019-06-12 DIAGNOSIS — Z08 Encounter for follow-up examination after completed treatment for malignant neoplasm: Secondary | ICD-10-CM | POA: Insufficient documentation

## 2019-06-12 DIAGNOSIS — Z885 Allergy status to narcotic agent status: Secondary | ICD-10-CM | POA: Insufficient documentation

## 2019-06-12 DIAGNOSIS — Z881 Allergy status to other antibiotic agents status: Secondary | ICD-10-CM | POA: Insufficient documentation

## 2019-06-12 DIAGNOSIS — Z79899 Other long term (current) drug therapy: Secondary | ICD-10-CM | POA: Diagnosis not present

## 2019-06-12 DIAGNOSIS — Z87891 Personal history of nicotine dependence: Secondary | ICD-10-CM | POA: Diagnosis not present

## 2019-06-12 DIAGNOSIS — K644 Residual hemorrhoidal skin tags: Secondary | ICD-10-CM | POA: Diagnosis not present

## 2019-06-12 DIAGNOSIS — M1990 Unspecified osteoarthritis, unspecified site: Secondary | ICD-10-CM | POA: Diagnosis not present

## 2019-06-12 DIAGNOSIS — J449 Chronic obstructive pulmonary disease, unspecified: Secondary | ICD-10-CM | POA: Insufficient documentation

## 2019-06-12 DIAGNOSIS — K5730 Diverticulosis of large intestine without perforation or abscess without bleeding: Secondary | ICD-10-CM | POA: Insufficient documentation

## 2019-06-12 DIAGNOSIS — F419 Anxiety disorder, unspecified: Secondary | ICD-10-CM | POA: Diagnosis not present

## 2019-06-12 DIAGNOSIS — K6289 Other specified diseases of anus and rectum: Secondary | ICD-10-CM

## 2019-06-12 DIAGNOSIS — I899 Noninfective disorder of lymphatic vessels and lymph nodes, unspecified: Secondary | ICD-10-CM

## 2019-06-12 DIAGNOSIS — C7A026 Malignant carcinoid tumor of the rectum: Secondary | ICD-10-CM

## 2019-06-12 DIAGNOSIS — Z85048 Personal history of other malignant neoplasm of rectum, rectosigmoid junction, and anus: Secondary | ICD-10-CM

## 2019-06-12 HISTORY — PX: BIOPSY: SHX5522

## 2019-06-12 HISTORY — DX: Other seasonal allergic rhinitis: J30.2

## 2019-06-12 HISTORY — PX: FLEXIBLE SIGMOIDOSCOPY: SHX5431

## 2019-06-12 HISTORY — PX: EUS: SHX5427

## 2019-06-12 SURGERY — ULTRASOUND, LOWER GI TRACT, ENDOSCOPIC
Anesthesia: Monitor Anesthesia Care

## 2019-06-12 MED ORDER — PROPOFOL 500 MG/50ML IV EMUL
INTRAVENOUS | Status: DC | PRN
  Administered 2019-06-12: 120 ug/kg/min via INTRAVENOUS

## 2019-06-12 MED ORDER — PROPOFOL 10 MG/ML IV BOLUS
INTRAVENOUS | Status: DC | PRN
  Administered 2019-06-12: 20 mg via INTRAVENOUS

## 2019-06-12 MED ORDER — ONDANSETRON HCL 4 MG/2ML IJ SOLN: 4.0000 mg | Freq: Four times a day (QID) | INTRAMUSCULAR | Status: DC | PRN

## 2019-06-12 MED ORDER — OXYCODONE HCL 5 MG PO TABS: 5.0000 mg | ORAL_TABLET | Freq: Once | ORAL | Status: DC | PRN

## 2019-06-12 MED ORDER — OXYCODONE HCL 5 MG/5ML PO SOLN: 5.0000 mg | Freq: Once | ORAL | Status: DC | PRN

## 2019-06-12 MED ORDER — LACTATED RINGERS IV SOLN
INTRAVENOUS | Status: DC | PRN
  Administered 2019-06-12: 11:00:00 via INTRAVENOUS

## 2019-06-12 MED ORDER — SODIUM CHLORIDE 0.9 % IV SOLN: INTRAVENOUS | Status: DC

## 2019-06-12 MED ORDER — FENTANYL CITRATE (PF) 100 MCG/2ML IJ SOLN: 25.0000 ug | INTRAMUSCULAR | Status: DC | PRN

## 2019-06-12 NOTE — Transfer of Care (Signed)
Immediate Anesthesia Transfer of Care Note  Patient: Shannon [name]  Procedure(s) Performed: LOWER ENDOSCOPIC ULTRASOUND (EUS) (N/A ) FLEXIBLE SIGMOIDOSCOPY (N/A ) BIOPSY  Patient Location: PACU  Anesthesia Type:MAC  Level of Consciousness: awake, alert  and oriented  Airway & Oxygen Therapy: Patient Spontanous Breathing  Post-op Assessment: Report given to RN and Post -op Vital signs reviewed and stable  Post vital signs: Reviewed and stable  Last Vitals:  Vitals Value Taken Time  BP    Temp    Pulse    Resp    SpO2      Last Pain:  Vitals:   06/12/19 0949  TempSrc: Oral  PainSc: 0-No pain         Complications: No apparent anesthesia complications

## 2019-06-12 NOTE — Anesthesia Preprocedure Evaluation (Signed)
Anesthesia Evaluation  Patient identified by MRN, date of birth, ID band Patient awake    Reviewed: Allergy & Precautions, H&P , NPO status , Patient's Chart, lab work & pertinent test results  Airway Mallampati: II   Neck ROM: full    Dental   Pulmonary shortness of breath, asthma , COPD, former smoker,    breath sounds clear to auscultation       Cardiovascular negative cardio ROS   Rhythm:regular Rate:Normal     Neuro/Psych PSYCHIATRIC DISORDERS Anxiety    GI/Hepatic Rectal carcinoid   Endo/Other    Renal/GU      Musculoskeletal  (+) Arthritis ,   Abdominal   Peds  Hematology   Anesthesia Other Findings   Reproductive/Obstetrics                             Anesthesia Physical Anesthesia Plan  ASA: III  Anesthesia Plan: MAC   Post-op Pain Management:    Induction: Intravenous  PONV Risk Score and Plan: 2 and Propofol infusion and Treatment may vary due to age or medical condition  Airway Management Planned: Nasal Cannula  Additional Equipment:   Intra-op Plan:   Post-operative Plan:   Informed Consent: I have reviewed the patients History and Physical, chart, labs and discussed the procedure including the risks, benefits and alternatives for the proposed anesthesia with the patient or authorized representative who has indicated his/her understanding and acceptance.       Plan Discussed with: CRNA, Anesthesiologist and Surgeon  Anesthesia Plan Comments:         Anesthesia Quick Evaluation

## 2019-06-12 NOTE — Op Note (Signed)
[facility] Patient Name: Shannon [name] Procedure Date : 06/12/2019 MRN: [id number] Attending MD: [name] , MD Date of Birth: 31-May-1961 CSN: [id number] Age: 58 Admit Type: Outpatient Procedure:                Lower EUS Indications:              Post-op endosonographic surveillance for Rectal                            Carcinoid s/p EMR resection 1-year follow up EUS                            (history of bronchial carcinoid) Providers:                [name], MD, Vista Lawman, RN, Elspeth Cho Tech., Technician, Lerry Paterson, CRNA Referring MD:             Docia Chuck. [name], MD, Lilia Argue. Gerhardt Medicines:                Monitored Anesthesia Care Complications:            No immediate complications. Estimated Blood Loss:     Estimated blood loss was minimal. Procedure:                Pre-Anesthesia Assessment:                           - Prior to the procedure, a History and Physical                            was performed, and patient medications and                            allergies were reviewed. The patient's tolerance of                            previous anesthesia was also reviewed. The risks                            and benefits of the procedure and the sedation                            options and risks were discussed with the patient.                            All questions were answered, and informed consent                            was obtained. Prior Anticoagulants: The patient has                            taken no previous anticoagulant or antiplatelet  agents. ASA Grade Assessment: II - A patient with                            mild systemic disease. After reviewing the risks                            and benefits, the patient was deemed in                            satisfactory condition to undergo the procedure.                           After obtaining informed  consent, the endoscope was                            passed under direct vision. Throughout the                            procedure, the patient's blood pressure, pulse, and                            oxygen saturations were monitored continuously. The                            [id number] ([id number]) Olympus therapeutic                            gastroscope was introduced through the anus and                            advanced to the the left transverse colon. The                            [id number] ([id number]) Olympus Radial EUS scope was                            introduced through the anus and advanced to the the                            sigmoid colon for ultrasound. The lower EUS was                            accomplished without difficulty. The patient                            tolerated the procedure. The quality of the bowel                            preparation was good. Scope In: 11:26:07 AM Scope Out: 11:53:46 AM Total Procedure Duration: 0 hours 27 minutes 39 seconds  Findings:      The digital rectal exam findings include hemorrhoids. Pertinent       negatives include no palpable rectal lesions.      ENDOSONOGRAPHIC FINDING: :  Local wall thickening was found in the rectum. This was encountered from       6 to 7 cm (from the anal verge). This finding appeared to be primarily       due to increased thickness of the deep mucosa (Layer 2) and submucosa       (Layer 3).      No malignant-appearing lymph nodes were visualized in the perirectal       region and in the left iliac region. The nodes were.      The internal anal sphincter was visualized endosonographically and       appeared normal.      ENDOSCOPIC FINDING: :      A medium post mucosectomy scar was found in the distal rectum. The scar       was unremarkable in appearance. Biopsies were taken with a cold forceps       for histology.      Many small and large-mouthed diverticula were found in the  recto-sigmoid       colon, sigmoid colon and descending colon.      Normal mucosa was found in the entire visualized colon.      Non-bleeding non-thrombosed external and internal hemorrhoids were found       during retroflexion, during perianal exam and during digital exam. The       hemorrhoids were Grade III (internal hemorrhoids that prolapse but       require manual reduction). Impression:               EUS Impression:                           - Hemorrhoids found on digital rectal exam.                           - Local wall thickening was visualized                            endosonographically in the rectum. This was due to                            increased thickness of the deep mucosa (Layer 2)                            and submucosa (Layer 3).                           - No malignant-appearing lymph nodes were                            visualized endosonographically in the perirectal                            region and in the left iliac region.                           - The internal anal sphincter was visualized                            endosonographically and appeared normal.  FLEX Impression:                           - Post mucosectomy scar in the distal rectum.                            Biopsied.                           - Diverticulosis in the recto-sigmoid colon, in the                            sigmoid colon and in the descending colon.                           - Normal mucosa in the entire examined colon.                           - Non-bleeding non-thrombosed external and internal                            hemorrhoids. Recommendation:           - The patient will be observed post-procedure,                            until all discharge criteria are met.                           - Discharge patient to home.                           - Patient has a contact number available for                            emergencies. The signs  and symptoms of potential                            delayed complications were discussed with the                            patient. Return to normal activities tomorrow.                            Written discharge instructions were provided to the                            patient.                           - Await path results.                           - High fiber diet.                           - Preparation H Topical for next few days. May  need                            Anusol suppositories in future or CRS evaluation                            for hemorrhoidal issues if persistent.                           - Depending on final results will likely consider                            repeat EUS in 1-year. Will discuss with Dr.                            Servando Snare next evaluation via imaging and potential                            DOTATATE scan.                           - Will discuss potential role of Oncologic follow                            up.                           - The findings and recommendations were discussed                            with the patient. Procedure Code(s):        --- Professional ---                           [phone], Sigmoidoscopy, flexible; with endoscopic                            ultrasound examination                           76734, Sigmoidoscopy, flexible; with biopsy, single                            or multiple Diagnosis Code(s):        --- Professional ---                           K64.2, Third degree hemorrhoids                           K62.89, Other specified diseases of anus and rectum                           I89.9, Noninfective disorder of lymphatic vessels                            and lymph nodes, unspecified  G94.944, Other specified postprocedural states                           Z08, Encounter for follow-up examination after                            completed treatment for malignant neoplasm                            Z85.048, Personal history of other malignant                            neoplasm of rectum, rectosigmoid junction, and anus                           K57.30, Diverticulosis of large intestine without                            perforation or abscess without bleeding CPT copyright 2019 [employer]. All rights reserved. The codes documented in this report are preliminary and upon coder review may  be revised to meet current compliance requirements. [name], MD 06/12/2019 12:13:12 PM Number of Addenda: 0

## 2019-06-12 NOTE — H&P (Signed)
GASTROENTEROLOGY PROCEDURE H&P NOTE   Primary Care Physician: Rochel Brome, MD  HPI: Shannon [name] is a 58 y.o. female who presents for Lower EUS.  Past Medical History:  Diagnosis Date  . Anxiety   . Arthritis   . Asthma   . Cancer ([place])    rectal, Lung  . Dyspnea    with exertion  . Emphysema of lung ([place])    patient denies  . History of kidney stones    passed  . HLD (hyperlipidemia)   . Pneumonia 02/2018  . Rectal tumor    cancer  . Seasonal allergies    Past Surgical History:  Procedure Laterality Date  . ABLATION     endometrial  . BIOPSY  10/10/2018   Procedure: BIOPSY;  Surgeon: Rush Landmark Telford Nab., MD;  Location: [place];  Service: Gastroenterology;;  . COLONOSCOPY    . DOPPLER ECHOCARDIOGRAPHY    . ENDOSCOPIC MUCOSAL RESECTION N/A 10/10/2018   Procedure: ENDOSCOPIC MUCOSAL RESECTION;  Surgeon: Rush Landmark Telford Nab., MD;  Location: [place];  Service: Gastroenterology;  Laterality: N/A;  . EUS N/A 07/18/2018   Procedure: LOWER ENDOSCOPIC ULTRASOUND (EUS);  Surgeon: Irving Copas., MD;  Location: [place];  Service: Gastroenterology;  Laterality: N/A;  . EUS N/A 10/10/2018   Procedure: LOWER ENDOSCOPIC ULTRASOUND (EUS);  Surgeon: Irving Copas., MD;  Location: [place];  Service: Gastroenterology;  Laterality: N/A;  . FLEXIBLE SIGMOIDOSCOPY N/A 07/18/2018   Procedure: FLEXIBLE SIGMOIDOSCOPY;  Surgeon: Irving Copas., MD;  Location: [place];  Service: Gastroenterology;  Laterality: N/A;  . FLEXIBLE SIGMOIDOSCOPY N/A 10/10/2018   Procedure: FLEXIBLE SIGMOIDOSCOPY;  Surgeon: Rush Landmark Telford Nab., MD;  Location: [place];  Service: Gastroenterology;  Laterality: N/A;  . HEMOSTASIS CLIP PLACEMENT  10/10/2018   Procedure: HEMOSTASIS CLIP PLACEMENT;  Surgeon: Irving Copas., MD;  Location: [place];  Service: Gastroenterology;;  . LUNG CANCER SURGERY  12/30/2018   right lun- mid lobe , dr  gerhardt  . SUBMUCOSAL LIFTING INJECTION  10/10/2018   Procedure: SUBMUCOSAL LIFTING INJECTION;  Surgeon: Irving Copas., MD;  Location: [place];  Service: Gastroenterology;;  . TEE WITHOUT CARDIOVERSION  02/2018  . TONSILLECTOMY    . TUBAL LIGATION  1987  . VIDEO ASSISTED THORACOSCOPY (VATS)/WEDGE RESECTION Right 12/30/2018   Procedure: VIDEO ASSISTED THORACOSCOPY (VATS), MINI THORACOTOMY, RIGHT MIDDLE LOBECTOMY WITH LYMPH NODE DISSECTION;  Surgeon: Grace Isaac, MD;  Location: [place];  Service: Thoracic;  Laterality: Right;  Marland Kitchen VIDEO BRONCHOSCOPY N/A 10/12/2018   Procedure: VIDEO BRONCHOSCOPY;  Surgeon: Grace Isaac, MD;  Location: [place];  Service: Thoracic;  Laterality: N/A;  . VIDEO BRONCHOSCOPY N/A 12/30/2018   Procedure: VIDEO BRONCHOSCOPY;  Surgeon: Grace Isaac, MD;  Location: [place];  Service: Thoracic;  Laterality: N/A;  . WISDOM TOOTH EXTRACTION     No current facility-administered medications for this encounter.    Allergies  Allergen Reactions  . Hydrocodone Nausea Only    "terrible headache"  . Other     UNSPECIFIED REACTION  Dog Dander   . Levaquin [Levofloxacin In D5w] Nausea And Vomiting        Family History  Problem Relation Age of Onset  . Heart disease Mother   . COPD Mother   . Emphysema Mother   . Diabetes Father   . Colon cancer Neg Hx   . Colon polyps Neg Hx   . Esophageal cancer Neg Hx   . Rectal cancer Neg Hx   . Stomach  cancer Neg Hx    Social History   Socioeconomic History  . Marital status: Married    Spouse name: Not on file  . Number of children: Not on file  . Years of education: Not on file  . Highest education level: Not on file  Occupational History  . Not on file  Social Needs  . Financial resource strain: Not on file  . Food insecurity    Worry: Not on file    Inability: Not on file  . Transportation needs    Medical: Not on file    Non-medical: Not on file  Tobacco Use  . Smoking status: Former  Smoker    Years: 33.00    Types: Cigarettes    Quit date: 2008    Years since quitting: 12.8  . Smokeless tobacco: Never Used  Substance and Sexual Activity  . Alcohol use: Not Currently    Comment: rare  . Drug use: Not Currently  . Sexual activity: Not Currently    Birth control/protection: Post-menopausal  Lifestyle  . Physical activity    Days per week: Not on file    Minutes per session: Not on file  . Stress: Not on file  Relationships  . Social Herbalist on phone: Not on file    Gets together: Not on file    Attends religious service: Not on file    Active member of club or organization: Not on file    Attends meetings of clubs or organizations: Not on file    Relationship status: Not on file  . Intimate partner violence    Fear of current or ex partner: Not on file    Emotionally abused: Not on file    Physically abused: Not on file    Forced sexual activity: Not on file  Other Topics Concern  . Not on file  Social History Narrative  . Not on file    Physical Exam: Vital signs in last 24 hours:     GEN: NAD EYE: Sclerae anicteric ENT: MMM CV: Non-tachycardic GI: Soft, NT/ND NEURO:  Alert & Oriented x 3  Lab Results: No results for input(s): WBC, HGB, HCT, PLT in the last 72 hours. BMET No results for input(s): NA, K, CL, CO2, GLUCOSE, BUN, CREATININE, CALCIUM in the last 72 hours. LFT No results for input(s): PROT, ALBUMIN, AST, ALT, ALKPHOS, BILITOT, BILIDIR, IBILI in the last 72 hours. PT/INR No results for input(s): LABPROT, INR in the last 72 hours.   Impression / Plan: This is a 58 y.o.female who presents for Lower EUS.  The risks and benefits of endoscopic evaluation were discussed with the patient; these include but are not limited to the risk of perforation, infection, bleeding, missed lesions, lack of diagnosis, severe illness requiring hospitalization, as well as anesthesia and sedation related illnesses.  The patient is  agreeable to proceed.    [name], MD [facility] Office # [phone]

## 2019-06-13 ENCOUNTER — Encounter (HOSPITAL_COMMUNITY): Payer: Self-pay | Admitting: Gastroenterology

## 2019-06-13 LAB — SURGICAL PATHOLOGY

## 2019-06-13 NOTE — Anesthesia Postprocedure Evaluation (Signed)
Anesthesia Post Note  Patient: Shannon [name]  Procedure(s) Performed: LOWER ENDOSCOPIC ULTRASOUND (EUS) (N/A ) FLEXIBLE SIGMOIDOSCOPY (N/A ) BIOPSY     Patient location during evaluation: PACU Anesthesia Type: MAC Level of consciousness: awake and alert Pain management: pain level controlled Vital Signs Assessment: post-procedure vital signs reviewed and stable Respiratory status: spontaneous breathing, nonlabored ventilation, respiratory function stable and patient connected to nasal cannula oxygen Cardiovascular status: stable and blood pressure returned to baseline Postop Assessment: no apparent nausea or vomiting Anesthetic complications: no    Last Vitals:  Vitals:   06/12/19 1205 06/12/19 1220  BP: (!) 137/59   Pulse: 60 60  Resp: 14 14  Temp: (!) 36.4 C 36.6 C  SpO2: 100% 100%    Last Pain:  Vitals:   06/12/19 1220  TempSrc:   PainSc: 0-No pain                 Suraya Vidrine S

## 2019-06-14 ENCOUNTER — Other Ambulatory Visit: Payer: Self-pay | Admitting: Cardiothoracic Surgery

## 2019-06-14 DIAGNOSIS — C3490 Malignant neoplasm of unspecified part of unspecified bronchus or lung: Secondary | ICD-10-CM

## 2019-06-14 LAB — CHROMOGRANIN A: Chromogranin A (ng/mL): 33.9 ng/mL (ref 0.0–101.8)

## 2019-06-15 ENCOUNTER — Encounter: Payer: Self-pay | Admitting: Gastroenterology

## 2019-07-13 ENCOUNTER — Other Ambulatory Visit: Payer: Self-pay

## 2019-07-13 ENCOUNTER — Ambulatory Visit
Admission: RE | Admit: 2019-07-13 | Discharge: 2019-07-13 | Disposition: A | Discharge status: Home / Self Care | Payer: BC Managed Care – PPO | Source: Ambulatory Visit | Attending: Cardiothoracic Surgery | Admitting: Cardiothoracic Surgery

## 2019-07-13 ENCOUNTER — Ambulatory Visit (INDEPENDENT_AMBULATORY_CARE_PROVIDER_SITE_OTHER): Payer: BC Managed Care – PPO | Admitting: Cardiothoracic Surgery

## 2019-07-13 DIAGNOSIS — Z902 Acquired absence of lung [part of]: Secondary | ICD-10-CM

## 2019-07-13 DIAGNOSIS — C3490 Malignant neoplasm of unspecified part of unspecified bronchus or lung: Secondary | ICD-10-CM

## 2019-07-13 DIAGNOSIS — D3A026 Benign carcinoid tumor of the rectum: Secondary | ICD-10-CM

## 2019-07-13 DIAGNOSIS — C7A090 Malignant carcinoid tumor of the bronchus and lung: Secondary | ICD-10-CM

## 2019-07-13 NOTE — Progress Notes (Addendum)
Telehealth Visit     Virtual Visit via Telephone Note   This visit type was conducted due to national recommendations for restrictions regarding the COVID-19 Pandemic (e.g. social distancing) in an effort to limit this patient's exposure and mitigate transmission in our community.  Due to her co-morbid illnesses, this patient is at least at moderate risk for complications without adequate follow up.  This format is felt to be most appropriate for this patient at this time.  The patient did not have access to video technology/had technical difficulties with video requiring transitioning to audio format only (telephone).  All issues noted in this document were discussed and addressed.  No physical exam could be performed with this format.  This visit type was conducted due to national recommendations for restrictions regarding the COVID-19 Pandemic (e.g. social distancing).  This format is felt to be most appropriate for this patient at this time.  All issues noted in this document were discussed and addressed.  No physical exam was performed (except for noted visual exam findings with Video Visits).          [place] Record #[id number] Date of Birth: 04-27-1961  Referring: Irving Copas.* Primary Care: Rochel Brome, MD Primary Cardiologist: No primary care provider on file.   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 POSTOPERATIVE DIAGNOSIS:  Carcinoid right middle lobe.   PROCEDURE PERFORMED:   1.  Video bronchoscopy. 2.  Right video-assisted thoracoscopy. 3.  Mini thoracotomy.   4.  Right middle lobectomy with primary hand sewn suture closure of the middle lobe bronchus.  Cancer Staging Carcinoid bronchial adenoma, right ([place]) Staging form: Lung, AJCC 8th Edition - Clinical: cT1, cN0, cM0 - Signed by Grace Isaac, MD on 04/14/2019 -  Pathologic stage from 04/14/2019: pT1, pN0, cM0 - Signed by Grace Isaac, MD on 04/14/2019  History of Present Illness:     Patient came today with follow up ct of chest  after resection of the right middle lobe carcinoid on December 30, 2018.  Patient doing well postoperatively without specific complaints.  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    Social History   Tobacco Use  Smoking Status Former Smoker  . Years: 33.00  . Types: Cigarettes  . Quit date: 2008  . Years since quitting: 12.9  Smokeless Tobacco Never Used    Social History   Substance and Sexual Activity  Alcohol Use Not Currently   Comment: rare     Allergies  Allergen Reactions  . Hydrocodone Nausea Only    "terrible headache"  . Other     UNSPECIFIED REACTION  Dog Dander   . Levaquin [Levofloxacin In D5w] Nausea And Vomiting         Current Outpatient Medications  Medication Sig Dispense Refill  . albuterol (PROVENTIL HFA;VENTOLIN HFA) 108 (90 Base) MCG/ACT inhaler Inhale 2 puffs into the lungs every 6 (six) hours as needed for wheezing or shortness of breath.    . Ascorbic Acid (VITAMIN C) 1000 MG tablet Take 1,000 mg by mouth every evening.    Marland Kitchen atorvastatin (LIPITOR) 20 MG tablet Take 20 mg by mouth every evening.     . busPIRone (BUSPAR) 10 MG tablet Take 5 mg by mouth 2 (two) times daily.     . calcium carbonate (TUMS - DOSED IN  MG ELEMENTAL CALCIUM) 500 MG chewable tablet Chew 1-2 tablets by mouth daily as needed for indigestion or heartburn.    . Cholecalciferol (VITAMIN D-3) 125 MCG (5000 UT) TABS Take 5,000 Units by mouth every evening.    . Cyanocobalamin (B-12) 2500 MCG TABS Take 2,500 mcg by mouth daily.    . fluticasone (FLONASE) 50 MCG/ACT nasal spray Place 1 spray into both nostrils daily as needed for allergies or rhinitis.    Marland Kitchen ibuprofen (ADVIL,MOTRIN) 200 MG tablet Take 400-600 mg by mouth daily  as needed for headache or moderate pain.     Marland Kitchen LORazepam (ATIVAN) 0.5 MG tablet Take 0.5 mg by mouth at bedtime.     . Multiple Vitamin (MULTIVITAMIN WITH MINERALS) TABS tablet Take 1 tablet by mouth daily.     No current facility-administered medications for this visit.       Physical Exam: LMP  (LMP Unknown)   Diagnostic Studies & Laboratory data:     Recent Radiology Findings:   CT CHEST WO CONTRAST  Result Date: 07/13/2019 CLINICAL DATA:  Follow-up right lung nodule history of right middle lobe lung cancer, post lobectomy in June of 2020. EXAM: CT CHEST WITHOUT CONTRAST TECHNIQUE: Multidetector CT imaging of the chest was performed following the standard protocol without IV contrast. COMPARISON:  01/01/2019 FINDINGS: Cardiovascular: Minimal scattered atherosclerotic changes. Vessels not well evaluated given lack of intravenous contrast. No signs of pericardial effusion. Right hilar distortion following right middle lobectomy. Mediastinum/Nodes: Stable AP window lymph node versus small cystic area (image 65, series 2) this measures 11 mm short axis. No thoracic inlet adenopathy. Right hilar distortion from previous partial lung resection. Lungs/Pleura: Since the previous exam postoperative changes have nearly completely resolved. Chain sutures extend from the anterior right hilum toward the anterior chest with signs of presumed scarring along this band of postoperative change. Background centrilobular emphysematous changes with similar appearance. Small right upper lobe nodule (image 29, series 8) 5 mm unchanged from previous exam. New ill-defined area of nodularity in the anterior right lung apex (image 25, series 8) 3 mm not present on preoperative evaluation. Upper Abdomen: Incidental imaging of upper abdominal contents is unremarkable. Musculoskeletal: No acute bone finding or destructive bone process. IMPRESSION: 1. New ill-defined area of nodularity in the anterior right lung apex (image  25, series 8) not present on preoperative evaluation. This could be related to post operative changes, consider 3 month follow-up for further assessment. 2. Stable 5 mm right upper lobe pulmonary nodule. 3. Stable small mediastinal lymph node versus small pericardial cyst, attention on follow-up 4. Stable emphysematous changes. 5. Emphysema and aortic atherosclerosis. Aortic Atherosclerosis (ICD10-I70.0) and Emphysema (ICD10-J43.9). Electronically Signed   By: Zetta Bills M.D.   On: 07/13/2019 14:00  I have independently reviewed the above radiology studies  and reviewed the findings with the patient.      Recent Lab Findings: Lab Results  Component Value Date   WBC 10.2 01/02/2019   HGB 12.9 01/02/2019   HCT 41.7 01/02/2019   PLT 208 01/02/2019   GLUCOSE 109 (H) 01/02/2019   ALT 19 01/01/2019   AST 30 01/01/2019   NA 138 01/02/2019   K 4.2 01/02/2019   CL 102 01/02/2019   CREATININE 0.73 01/02/2019   BUN 12 01/02/2019   CO2 29 01/02/2019   INR 1.1 12/27/2018      Assessment / Plan:   #1 doing well following right middle lobectomy for incidental finding of bronchial carcinoid-plan follow-up CT of the chest  in December 2020 #2 history of GI carcinoid-for repeat colonoscopy with ultrasound in November-demonstrated no evidence of recurrent carcinoid in the rectum where she had previously had submucosal resection.   I reviewed with the patient the CT scan of the chest done today,  the areas in question at the site of previous lung resection are clear of evidence of recurrence,  there is a new 3 mm nodule high at the right apex this was not  evident on previous scans.  The size is too small to  characterize.  I suggested to the patient that we obtain a follow-up scan CT of the chest in 3 months and consider repeat dotatate scan in 6 months from now at approximately 1 year postop.     Medication Changes: No orders of the defined types were placed in this encounter.     Grace Isaac MD      [place] [place] [address] Office [phone]   Beeper [phone]  07/13/2019 6:54 PM

## 2019-08-25 ENCOUNTER — Other Ambulatory Visit: Payer: Self-pay

## 2019-08-25 ENCOUNTER — Encounter (HOSPITAL_COMMUNITY): Payer: Self-pay | Admitting: Emergency Medicine

## 2019-08-25 ENCOUNTER — Ambulatory Visit (HOSPITAL_COMMUNITY)
Admission: EM | Admit: 2019-08-25 | Discharge: 2019-08-25 | Disposition: A | Discharge status: Home / Self Care | Payer: BC Managed Care – PPO | Attending: Family Medicine | Admitting: Family Medicine

## 2019-08-25 DIAGNOSIS — R8290 Unspecified abnormal findings in urine: Secondary | ICD-10-CM | POA: Diagnosis not present

## 2019-08-25 DIAGNOSIS — R109 Unspecified abdominal pain: Secondary | ICD-10-CM | POA: Diagnosis not present

## 2019-08-25 DIAGNOSIS — N3091 Cystitis, unspecified with hematuria: Secondary | ICD-10-CM | POA: Diagnosis present

## 2019-08-25 LAB — POCT URINALYSIS DIP (DEVICE)
Glucose, UA: 100 mg/dL — AB
Leukocytes,Ua: NEGATIVE
Nitrite: NEGATIVE
Protein, ur: 100 mg/dL — AB
Specific Gravity, Urine: 1.025 (ref 1.005–1.030)
Urobilinogen, UA: 0.2 mg/dL (ref 0.0–1.0)
pH: 6 (ref 5.0–8.0)

## 2019-08-25 LAB — CBG MONITORING, ED: Glucose-Capillary: 79 mg/dL (ref 70–99)

## 2019-08-25 LAB — GLUCOSE, CAPILLARY: Glucose-Capillary: 79 mg/dL (ref 70–99)

## 2019-08-25 MED ORDER — NITROFURANTOIN MONOHYD MACRO 100 MG PO CAPS: 100.0000 mg | ORAL_CAPSULE | Freq: Two times a day (BID) | ORAL | 0 refills | Status: DC

## 2019-08-25 NOTE — ED Provider Notes (Addendum)
[facility]    CSN: [id number] Arrival date & time: 08/25/19  1036      History   Chief Complaint Chief Complaint  Patient presents with  . Hematuria  . Abdominal Cramping    HPI Shannon [name] is a 59 y.o. female.   HPI  Patient presents for evaluation of hematuria and lower abdominal cramping x today. Pain feels similar to prior UTI. Her urine has had a strong odor for a few days. Medical history significant for rectal and lung cancer. No active cancer treatments. Bother tumors were treated surgically. Reports symptoms of lower abdominal cramping developed acutely this morning Noticed scant bleeding with urination and with subsequent urination, blood mixed with urine was present.  No fever, chills, nausea, vomiting or diarrhea. No low back pain or CVA tenderness. Past Medical History:  Diagnosis Date  . Anxiety   . Arthritis   . Asthma   . Cancer ([facility])    rectal, Lung  . Dyspnea    with exertion  . Emphysema of lung ([facility])    patient denies  . History of kidney stones    passed  . HLD (hyperlipidemia)   . Pneumonia 02/2018  . Rectal tumor    cancer  . Seasonal allergies     Patient Active Problem List   Diagnosis Date Noted  . 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 S/P lobectomy of lung 12/30/2018  . 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 Abnormal colonoscopy 07/07/2018    Past Surgical History:  Procedure Laterality Date  . ABLATION     endometrial  . BIOPSY  10/10/2018   Procedure: BIOPSY;  Surgeon: Rush Landmark Telford Nab., MD;  Location: [facility];  Service: Gastroenterology;;  . BIOPSY  06/12/2019   Procedure: BIOPSY;  Surgeon: Irving Copas., MD;  Location: [facility];  Service: Gastroenterology;;  . COLONOSCOPY    . DOPPLER ECHOCARDIOGRAPHY    . ENDOSCOPIC MUCOSAL RESECTION N/A 10/10/2018   Procedure: ENDOSCOPIC MUCOSAL RESECTION;  Surgeon: Rush Landmark Telford Nab.,  MD;  Location: [facility];  Service: Gastroenterology;  Laterality: N/A;  . EUS N/A 07/18/2018   Procedure: LOWER ENDOSCOPIC ULTRASOUND (EUS);  Surgeon: Irving Copas., MD;  Location: [facility];  Service: Gastroenterology;  Laterality: N/A;  . EUS N/A 10/10/2018   Procedure: LOWER ENDOSCOPIC ULTRASOUND (EUS);  Surgeon: Irving Copas., MD;  Location: [facility];  Service: Gastroenterology;  Laterality: N/A;  . EUS N/A 06/12/2019   Procedure: LOWER ENDOSCOPIC ULTRASOUND (EUS);  Surgeon: Irving Copas., MD;  Location: [facility];  Service: Gastroenterology;  Laterality: N/A;  . FLEXIBLE SIGMOIDOSCOPY N/A 07/18/2018   Procedure: FLEXIBLE SIGMOIDOSCOPY;  Surgeon: Irving Copas., MD;  Location: [facility];  Service: Gastroenterology;  Laterality: N/A;  . FLEXIBLE SIGMOIDOSCOPY N/A 10/10/2018   Procedure: FLEXIBLE SIGMOIDOSCOPY;  Surgeon: Rush Landmark Telford Nab., MD;  Location: [facility];  Service: Gastroenterology;  Laterality: N/A;  . FLEXIBLE SIGMOIDOSCOPY N/A 06/12/2019   Procedure: FLEXIBLE SIGMOIDOSCOPY;  Surgeon: Irving Copas., MD;  Location: [facility];  Service: Gastroenterology;  Laterality: N/A;  . HEMOSTASIS CLIP PLACEMENT  10/10/2018   Procedure: HEMOSTASIS CLIP PLACEMENT;  Surgeon: Irving Copas., MD;  Location: [facility];  Service: Gastroenterology;;  . LUNG CANCER SURGERY  12/30/2018   right lun- mid lobe , dr gerhardt  . SUBMUCOSAL LIFTING INJECTION  10/10/2018   Procedure: SUBMUCOSAL LIFTING INJECTION;  Surgeon: Rush Landmark Telford Nab., MD;  Location: [facility];  Service: Gastroenterology;;  .  TEE WITHOUT CARDIOVERSION  02/2018  . TONSILLECTOMY    . TUBAL LIGATION  1987  . VIDEO ASSISTED THORACOSCOPY (VATS)/WEDGE RESECTION Right 12/30/2018   Procedure: VIDEO ASSISTED THORACOSCOPY (VATS), MINI THORACOTOMY, RIGHT MIDDLE LOBECTOMY WITH LYMPH NODE DISSECTION;  Surgeon: Grace Isaac, MD;  Location: [facility];   Service: Thoracic;  Laterality: Right;  Marland Kitchen VIDEO BRONCHOSCOPY N/A 10/12/2018   Procedure: VIDEO BRONCHOSCOPY;  Surgeon: Grace Isaac, MD;  Location: [facility];  Service: Thoracic;  Laterality: N/A;  . VIDEO BRONCHOSCOPY N/A 12/30/2018   Procedure: VIDEO BRONCHOSCOPY;  Surgeon: Grace Isaac, MD;  Location: [facility];  Service: Thoracic;  Laterality: N/A;  . WISDOM TOOTH EXTRACTION      OB History   No obstetric history on file.      Home Medications    Prior to Admission medications   Medication Sig Start Date End Date Taking? Authorizing Provider  Ascorbic Acid (VITAMIN C) 1000 MG tablet Take 1,000 mg by mouth every evening.   Yes [provider]  atorvastatin (LIPITOR) 20 MG tablet Take 20 mg by mouth every evening.    Yes [provider]  busPIRone (BUSPAR) 10 MG tablet Take 5 mg by mouth 2 (two) times daily.  02/07/18  Yes [provider]  Cholecalciferol (VITAMIN D-3) 125 MCG (5000 UT) TABS Take 5,000 Units by mouth every evening.   Yes [provider]  Cyanocobalamin (B-12) 2500 MCG TABS Take 2,500 mcg by mouth daily.   Yes [provider]  LORazepam (ATIVAN) 0.5 MG tablet Take 0.5 mg by mouth at bedtime.  01/30/19  Yes [provider]  Multiple Vitamin (MULTIVITAMIN WITH MINERALS) TABS tablet Take 1 tablet by mouth daily.   Yes [provider]  albuterol (PROVENTIL HFA;VENTOLIN HFA) 108 (90 Base) MCG/ACT inhaler Inhale 2 puffs into the lungs every 6 (six) hours as needed for wheezing or shortness of breath.    [provider]  calcium carbonate (TUMS - DOSED IN MG ELEMENTAL CALCIUM) 500 MG chewable tablet Chew 1-2 tablets by mouth daily as needed for indigestion or heartburn.    [provider]  fluticasone (FLONASE) 50 MCG/ACT nasal spray Place 1 spray into both nostrils daily as needed for allergies or rhinitis.    [provider]  ibuprofen (ADVIL,MOTRIN) 200 MG tablet Take 400-600 mg by mouth  daily as needed for headache or moderate pain.     [provider]    Family History Family History  Problem Relation Age of Onset  . Heart disease Mother   . COPD Mother   . Emphysema Mother   . Diabetes Father   . Colon cancer Neg Hx   . Colon polyps Neg Hx   . Esophageal cancer Neg Hx   . Rectal cancer Neg Hx   . Stomach cancer Neg Hx     Social History Social History   Tobacco Use  . Smoking status: Former Smoker    Years: 33.00    Types: Cigarettes    Quit date: 2008    Years since quitting: 13.0  . Smokeless tobacco: Never Used  Substance Use Topics  . Alcohol use: Not Currently    Comment: rare  . Drug use: Not Currently     Allergies   Hydrocodone, Other, and Levaquin [levofloxacin in d5w]   Review of Systems Review of Systems Pertinent negatives listed in HPI  Physical Exam Triage Vital Signs ED Triage Vitals  Enc Vitals Group     BP 08/25/19  1107 (!) 159/80     Pulse Rate 08/25/19 1107 66     Resp 08/25/19 1107 18     Temp 08/25/19 1107 98.4 F (36.9 C)     Temp Source 08/25/19 1107 Oral     SpO2 08/25/19 1107 98 %     Weight --      Height --      Head Circumference --      Peak Flow --      Pain Score 08/25/19 1104 2     Pain Loc --      Pain Edu? --      Excl. in [place]? --    No data found.  Updated Vital Signs BP (!) 159/80 (BP Location: Right Arm)   Pulse 66   Temp 98.4 F (36.9 C) (Oral)   Resp 18   LMP  (LMP Unknown)   SpO2 98%   Visual Acuity Right Eye Distance:   Left Eye Distance:   Bilateral Distance:    Right Eye Near:   Left Eye Near:    Bilateral Near:     Physical Exam General appearance: alert, well developed, well nourished, cooperative and in no distress Head: Normocephalic, without obvious abnormality, atraumatic Respiratory: Respirations even and unlabored, normal respiratory rate Heart: rate and rhythm normal. No gallop or murmurs noted on exam  Abdomen: BS +, negative CVA tenderness, bilateral  lower abdominal tenderness, no distention, no rebound tenderness, or no mass. Extremities: No gross deformities or edema  Skin: Skin color, texture, turgor normal. No rashes seen  Psych: Appropriate mood and affect. Neurologic: Mental status: Alert, oriented to person, place, and time, thought content appropriate.  UC Treatments / Results  Labs (all labs ordered are listed, but only abnormal results are displayed) Labs Reviewed  POCT URINALYSIS DIP (DEVICE) - Abnormal; Notable for the following components:      Result Value   Glucose, UA 100 (*)    Bilirubin Urine SMALL (*)    Ketones, ur TRACE (*)    Hgb urine dipstick LARGE (*)    Protein, ur 100 (*)    All other components within normal limits  URINE CULTURE    EKG   Radiology No results found.  Procedures Procedures (including critical care time)  Medications Ordered in UC Medications - No data to display  Initial Impression / Assessment and Plan / UC Course  I have reviewed the triage vital signs and the nursing notes.  Pertinent labs & imaging results that were available during my care of the patient were reviewed by me and considered in my medical decision making (see chart for details).   Patient with a history of rectal and lung cancer presents with symptoms of suspected UTI with hematuria. UA significant for hematuria and proteinuria. Will treat empirically with Macrobid BID x  Days. No leukocytes noted on dipstick, however, symptom only presen today. Urine culture pending. Glucose noted in urine, although glucose on fingerstick normal. Strict red flags discussed. Advised to go immediately to ER if urine turns into thick blood,  if blood clots develop, fever develops, and or abdominal cramping worsens. Advised to schedule a follow-up with PCP as symptoms warranted close follow-up. Patient verbalizes understanding and agreement with plan. Final Clinical Impressions(s) / UC Diagnoses   Final diagnoses:  Abdominal  cramping  Hemorrhagic cystitis     Discharge Instructions     If you experience worsening pain with urination or experience urination of blood only, go immediately to the emergency  department. If your abdominal pain worsens, go immediately to the emergency department. Urine culture is pending. If no bacteria grows, I recommend emergent follow-up with your primary care and urologist.      ED Prescriptions    Medication Sig Dispense Auth. Provider   nitrofurantoin, macrocrystal-monohydrate, (MACROBID) 100 MG capsule Take 1 capsule (100 mg total) by mouth 2 (two) times daily. 20 capsule Scot Jun, FNP     PDMP not reviewed this encounter.   Scot Jun, FNP 08/25/19 2152    Scot Jun, FNP 08/25/19 2154

## 2019-08-25 NOTE — ED Triage Notes (Signed)
Pt reports vaginal bleeding and lower abdominal cramping that started this morning.

## 2019-08-25 NOTE — Discharge Instructions (Signed)
If you experience worsening pain with urination or experience urination of blood only, go immediately to the emergency department. If your abdominal pain worsens, go immediately to the emergency department. Urine culture is pending. If no bacteria grows, I recommend emergent follow-up with your primary care and urologist.

## 2019-08-26 LAB — URINE CULTURE: Special Requests: NORMAL

## 2019-08-31 ENCOUNTER — Other Ambulatory Visit: Payer: Self-pay | Admitting: *Deleted

## 2019-08-31 DIAGNOSIS — R918 Other nonspecific abnormal finding of lung field: Secondary | ICD-10-CM

## 2019-08-31 DIAGNOSIS — D3A090 Benign carcinoid tumor of the bronchus and lung: Secondary | ICD-10-CM

## 2019-09-19 ENCOUNTER — Other Ambulatory Visit: Payer: Self-pay

## 2019-09-19 MED ORDER — ATORVASTATIN CALCIUM 20 MG PO TABS: 20.0000 mg | ORAL_TABLET | Freq: Every evening | ORAL | 0 refills | Status: DC

## 2019-10-19 ENCOUNTER — Other Ambulatory Visit: Payer: Self-pay

## 2019-10-19 ENCOUNTER — Ambulatory Visit
Admission: RE | Admit: 2019-10-19 | Discharge: 2019-10-19 | Disposition: A | Discharge status: Home / Self Care | Payer: BC Managed Care – PPO | Source: Ambulatory Visit | Attending: Cardiothoracic Surgery | Admitting: Cardiothoracic Surgery

## 2019-10-19 ENCOUNTER — Ambulatory Visit: Payer: BC Managed Care – PPO | Admitting: Cardiothoracic Surgery

## 2019-10-19 VITALS — BP 150/87 | HR 63 | Temp 97.5°F | Resp 20 | Ht 65.0 in | Wt 192.0 lb

## 2019-10-19 DIAGNOSIS — R918 Other nonspecific abnormal finding of lung field: Secondary | ICD-10-CM

## 2019-10-19 DIAGNOSIS — C7A090 Malignant carcinoid tumor of the bronchus and lung: Secondary | ICD-10-CM

## 2019-10-19 DIAGNOSIS — D3A090 Benign carcinoid tumor of the bronchus and lung: Secondary | ICD-10-CM

## 2019-10-19 DIAGNOSIS — Z902 Acquired absence of lung [part of]: Secondary | ICD-10-CM | POA: Diagnosis not present

## 2019-10-19 DIAGNOSIS — R911 Solitary pulmonary nodule: Secondary | ICD-10-CM

## 2019-10-19 NOTE — Progress Notes (Signed)
[facility] Record #[id number] Date of Birth: December 31, 1960  Referring: Irving Copas.* Primary Care: Rochel Brome, MD Primary Cardiologist: No primary care provider on file.   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 POSTOPERATIVE DIAGNOSIS:  Carcinoid right middle lobe.   PROCEDURE PERFORMED:   1.  Video bronchoscopy. 2.  Right video-assisted thoracoscopy. 3.  Mini thoracotomy.   4.  Right middle lobectomy with primary hand sewn suture closure of the middle lobe bronchus.  Cancer Staging Carcinoid bronchial adenoma, right ([place]) Staging form: Lung, AJCC 8th Edition - Clinical: cT1, cN0, cM0 - Signed by Grace Isaac, MD on 04/14/2019 - Pathologic stage from 04/14/2019: pT1, pN0, cM0 - Signed by Grace Isaac, MD on 04/14/2019  History of Present Illness:     Patient came today with follow up ct of chest  after resection of the right middle lobe carcinoid on December 30, 2018.  Patient doing well postoperatively without specific complaints.  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  Patient notes she has avoided any exposure or symptoms of Covid, she is now had Covid vaccination x2  Social History   Tobacco Use  Smoking Status Former Smoker  . Years: 33.00  . Types: Cigarettes  . Quit date: 2008  . Years since quitting: 13.2  Smokeless Tobacco Never Used    Social History   Substance and Sexual Activity  Alcohol Use Not Currently   Comment: rare     Allergies  Allergen Reactions  . Hydrocodone Nausea Only    "terrible headache"  . Other     UNSPECIFIED REACTION  Dog Dander   . Levaquin [Levofloxacin In D5w] Nausea And Vomiting         Current Outpatient Medications  Medication Sig Dispense Refill  . albuterol (PROVENTIL  HFA;VENTOLIN HFA) 108 (90 Base) MCG/ACT inhaler Inhale 2 puffs into the lungs every 6 (six) hours as needed for wheezing or shortness of breath.    . Ascorbic Acid (VITAMIN C) 1000 MG tablet Take 1,000 mg by mouth every evening.    Marland Kitchen atorvastatin (LIPITOR) 20 MG tablet Take 1 tablet (20 mg total) by mouth every evening. 90 tablet 0  . busPIRone (BUSPAR) 10 MG tablet Take 5 mg by mouth 2 (two) times daily.     . calcium carbonate (TUMS - DOSED IN MG ELEMENTAL CALCIUM) 500 MG chewable tablet Chew 1-2 tablets by mouth daily as needed for indigestion or heartburn.    . Cholecalciferol (VITAMIN D-3) 125 MCG (5000 UT) TABS Take 5,000 Units by mouth every evening.    . Cyanocobalamin (B-12) 2500 MCG TABS Take 2,500 mcg by mouth daily.    . fluticasone (FLONASE) 50 MCG/ACT nasal spray Place 1 spray into both nostrils daily as needed for allergies or rhinitis.    Marland Kitchen ibuprofen (ADVIL,MOTRIN) 200 MG tablet Take 400-600 mg by mouth daily as needed for headache or moderate pain.     Marland Kitchen LORazepam (ATIVAN) 0.5 MG tablet Take 0.5 mg by mouth at bedtime.     . Multiple Vitamin (MULTIVITAMIN WITH MINERALS) TABS tablet Take 1 tablet by mouth daily.    . nitrofurantoin, macrocrystal-monohydrate, (MACROBID) 100 MG capsule Take 1 capsule (100 mg total) by mouth 2 (two) times daily. (Patient not taking: Reported on 10/19/2019) 20 capsule 0  No current facility-administered medications for this visit.       Physical Exam: BP (!) 150/87   Pulse 63   Temp (!) 97.5 F (36.4 C) (Skin)   Resp 20   Ht 5\' 5"  (1.651 m)   Wt 87.1 kg   LMP  (LMP Unknown)   SpO2 96% Comment: RA  BMI 31.95 kg/m  General appearance: alert, cooperative and no distress Head: Normocephalic, without obvious abnormality, atraumatic Neck: no adenopathy, no carotid bruit, no JVD, supple, symmetrical, trachea midline and thyroid not enlarged, symmetric, no tenderness/mass/nodules Lymph nodes: Cervical, supraclavicular, and axillary nodes  normal. Resp: clear to auscultation bilaterally Cardio: regular rate and rhythm, S1, S2 normal, no murmur, click, rub or gallop GI: soft, non-tender; bowel sounds normal; no masses,  no organomegaly Extremities: extremities normal, atraumatic, no cyanosis or edema Neurologic: Grossly normal   Diagnostic Studies & Laboratory data:     Recent Radiology Findings:   CT CHEST WO CONTRAST  Result Date: 10/19/2019 CLINICAL DATA:  Follow-up lung nodule. History of right middle lobectomy June of 2020 EXAM: CT CHEST WITHOUT CONTRAST TECHNIQUE: Multidetector CT imaging of the chest was performed following the standard protocol without IV contrast. COMPARISON:  07/13/2019 FINDINGS: Cardiovascular: Normal heart size. No pericardial effusion. Thoracic aorta is normal in course and caliber. Minimal atherosclerotic calcification of the aortic arch and coronary arteries. Mediastinum/Nodes: AP window node measuring 9 mm short axis (previously 15 mm on 01/01/2019). No new or enlarging nodes. Unremarkable axilla. Postsurgical changes from right middle lobectomy. Unremarkable thyroid, trachea, and esophagus. Small hiatal hernia. Lungs/Pleura: Stable 4-5 mm area of nodularity in the anterior right lung apex (series 8, image 34) with calcification suggesting benign scarring or granuloma. Previously seen area of subtle nodularity in the medial right lung apex has resolved. Linear scarring related to right middle lobectomy. Mild background of centrilobular emphysema. No new pulmonary nodules. No airspace consolidation, pleural effusion, or pneumothorax. Upper Abdomen: No acute abnormality. Musculoskeletal: No chest wall mass or suspicious bone lesions identified. IMPRESSION: 1. Previously seen area of ill-defined nodularity in the right lung apex has resolved. 2. No new pulmonary nodules or suspicious findings within the chest. 3. Stable small right apical nodule with calcification suggesting an area of scarring or granuloma. 4.  Postsurgical changes from right middle lobectomy. 5. Decreased size of AP window lymph node. Electronically Signed   By: Davina Poke D.O.   On: 10/19/2019 15:07     I have independently reviewed the above radiology studies  and reviewed the findings with the patient.    Recent Lab Findings: Lab Results  Component Value Date   WBC 10.2 01/02/2019   HGB 12.9 01/02/2019   HCT 41.7 01/02/2019   PLT 208 01/02/2019   GLUCOSE 109 (H) 01/02/2019   ALT 19 01/01/2019   AST 30 01/01/2019   NA 138 01/02/2019   K 4.2 01/02/2019   CL 102 01/02/2019   CREATININE 0.73 01/02/2019   BUN 12 01/02/2019   CO2 29 01/02/2019   INR 1.1 12/27/2018      Assessment / Plan:   #1 doing well following right middle lobectomy for incidental finding of bronchial carcinoid-plan follow-up CT CT scan today shows no evidence of recurrence in the chest #2 history of GI carcinoid-for repeat colonoscopy with ultrasound in November-demonstrated no evidence of recurrent carcinoid in the rectum where she had previously had submucosal resection.  Patient without evidence of recurrent carcinoid of the chest on follow-up CT, with disappearance of the right lung  apical nodularity  Plan follow-up CT of the chest 6 months  Medication Changes: No orders of the defined types were placed in this encounter.     Grace Isaac MD      [address] [place] [address] Office [phone]   Beeper [phone]  10/19/2019 3:12 PM

## 2019-10-25 ENCOUNTER — Other Ambulatory Visit: Payer: Self-pay

## 2019-10-25 ENCOUNTER — Encounter: Payer: Self-pay | Admitting: Physician Assistant

## 2019-10-25 ENCOUNTER — Ambulatory Visit: Payer: BC Managed Care – PPO | Admitting: Physician Assistant

## 2019-10-25 VITALS — BP 138/80 | HR 61 | Temp 98.3°F | Resp 16 | Wt 191.0 lb

## 2019-10-25 DIAGNOSIS — F419 Anxiety disorder, unspecified: Secondary | ICD-10-CM

## 2019-10-25 DIAGNOSIS — R310 Gross hematuria: Secondary | ICD-10-CM | POA: Insufficient documentation

## 2019-10-25 DIAGNOSIS — E559 Vitamin D deficiency, unspecified: Secondary | ICD-10-CM

## 2019-10-25 DIAGNOSIS — D3A026 Benign carcinoid tumor of the rectum: Secondary | ICD-10-CM

## 2019-10-25 DIAGNOSIS — E782 Mixed hyperlipidemia: Secondary | ICD-10-CM | POA: Diagnosis not present

## 2019-10-25 LAB — POCT URINALYSIS DIPSTICK
Blood, UA: NEGATIVE
Glucose, UA: NEGATIVE
Ketones, UA: NEGATIVE
Leukocytes, UA: NEGATIVE
Nitrite, UA: NEGATIVE
Protein, UA: NEGATIVE
Spec Grav, UA: 1.015 (ref 1.010–1.025)
Urobilinogen, UA: 0.2 E.U./dL
pH, UA: 7.5 (ref 5.0–8.0)

## 2019-10-25 MED ORDER — FLUCONAZOLE 150 MG PO TABS: 150.0000 mg | ORAL_TABLET | Freq: Once | ORAL | 0 refills | Status: AC

## 2019-10-25 NOTE — Assessment & Plan Note (Signed)
Vit D level pending

## 2019-10-25 NOTE — Assessment & Plan Note (Signed)
Continue follow up with specialist as directed

## 2019-10-25 NOTE — Progress Notes (Signed)
Established Patient Office Visit  Subjective:  Patient ID: Shannon [name], female    DOB: [birth year]  Age: 59 y.o. MRN: [id number]  CC:  Chief Complaint  Patient presents with  . Hyperlipidemia  . Depression    HPI Shannon [name] presents for follow up hyperlipidemia  Pt presents with hyperlipidemia.  Compliance with treatment has been good; she does follow up as directed and tries to  She denies experiencing any hypercholesterolemia related symptoms.  Currently taking lipitor 20mg  qd    Follow up of generalized anxiety disorder.  pt doing well on her current medications she takes buspar 10mg  1/2 bid and ativan as needed - says controlling symptoms well    Follow up of personal history of malignant carcinoid tumor of rectum.  pt was found to have carcinoid tumor of rectum on screening colonoscopy which was removed 07/2018 - she follows with Dr  Rush Landmark and just had appt with him - to follow up in one year She also had a place noted on her lungs but recently had chest CT which was stable and will follow up with Dr Pia Mau in 6 months    Pt states she was seen by Urgent Care recently for what she thought was gross hematuria on 3 occasions - was treated with antibiotics but says urine culture was negative --- however in discussion with patient she now states she is unsure if the blood was from her urine or possibly vaginal bleeding - she says that the amount she had seemed more like a period. - given she is postmenopausal and also has history of carcinoid tumor highly recommend GYN follow up Past Medical History:  Diagnosis Date  . Anxiety   . Arthritis   . Asthma   . Cancer ([place])    rectal, Lung  . Dyspnea    with exertion  . Emphysema of lung ([place])    patient denies  . History of kidney stones    passed  . HLD (hyperlipidemia)   . Pneumonia 02/2018  . Rectal tumor    cancer  . Seasonal allergies     Past Surgical History:  Procedure Laterality Date  . ABLATION       endometrial  . BIOPSY  10/10/2018   Procedure: BIOPSY;  Surgeon: Rush Landmark Telford Nab., MD;  Location: [facility];  Service: Gastroenterology;;  . BIOPSY  06/12/2019   Procedure: BIOPSY;  Surgeon: Irving Copas., MD;  Location: [facility];  Service: Gastroenterology;;  . COLONOSCOPY    . DOPPLER ECHOCARDIOGRAPHY    . ENDOSCOPIC MUCOSAL RESECTION N/A 10/10/2018   Procedure: ENDOSCOPIC MUCOSAL RESECTION;  Surgeon: Rush Landmark Telford Nab., MD;  Location: [facility];  Service: Gastroenterology;  Laterality: N/A;  . EUS N/A 07/18/2018   Procedure: LOWER ENDOSCOPIC ULTRASOUND (EUS);  Surgeon: Irving Copas., MD;  Location: [facility];  Service: Gastroenterology;  Laterality: N/A;  . EUS N/A 10/10/2018   Procedure: LOWER ENDOSCOPIC ULTRASOUND (EUS);  Surgeon: Irving Copas., MD;  Location: [facility];  Service: Gastroenterology;  Laterality: N/A;  . EUS N/A 06/12/2019   Procedure: LOWER ENDOSCOPIC ULTRASOUND (EUS);  Surgeon: Irving Copas., MD;  Location: [facility];  Service: Gastroenterology;  Laterality: N/A;  . FLEXIBLE SIGMOIDOSCOPY N/A 07/18/2018   Procedure: FLEXIBLE SIGMOIDOSCOPY;  Surgeon: Irving Copas., MD;  Location: [facility];  Service: Gastroenterology;  Laterality: N/A;  . FLEXIBLE SIGMOIDOSCOPY N/A 10/10/2018   Procedure: FLEXIBLE SIGMOIDOSCOPY;  Surgeon: Rush Landmark Telford Nab., MD;  Location: [facility];  Service: Gastroenterology;  Laterality: N/A;  . FLEXIBLE SIGMOIDOSCOPY N/A 06/12/2019   Procedure: FLEXIBLE SIGMOIDOSCOPY;  Surgeon: Irving Copas., MD;  Location: [facility];  Service: Gastroenterology;  Laterality: N/A;  . HEMOSTASIS CLIP PLACEMENT  10/10/2018   Procedure: HEMOSTASIS CLIP PLACEMENT;  Surgeon: Irving Copas., MD;  Location: [facility];  Service: Gastroenterology;;  . LUNG CANCER SURGERY  12/30/2018   right lun- mid lobe , dr gerhardt  . SUBMUCOSAL LIFTING INJECTION  10/10/2018    Procedure: SUBMUCOSAL LIFTING INJECTION;  Surgeon: Irving Copas., MD;  Location: [facility];  Service: Gastroenterology;;  . TEE WITHOUT CARDIOVERSION  02/2018  . TONSILLECTOMY    . TUBAL LIGATION  1987  . VIDEO ASSISTED THORACOSCOPY (VATS)/WEDGE RESECTION Right 12/30/2018   Procedure: VIDEO ASSISTED THORACOSCOPY (VATS), MINI THORACOTOMY, RIGHT MIDDLE LOBECTOMY WITH LYMPH NODE DISSECTION;  Surgeon: Grace Isaac, MD;  Location: [facility];  Service: Thoracic;  Laterality: Right;  Marland Kitchen VIDEO BRONCHOSCOPY N/A 10/12/2018   Procedure: VIDEO BRONCHOSCOPY;  Surgeon: Grace Isaac, MD;  Location: [employer] [facility];  Service: Thoracic;  Laterality: N/A;  . VIDEO BRONCHOSCOPY N/A 12/30/2018   Procedure: VIDEO BRONCHOSCOPY;  Surgeon: Grace Isaac, MD;  Location: [facility];  Service: Thoracic;  Laterality: N/A;  . WISDOM TOOTH EXTRACTION      Family History  Problem Relation Age of Onset  . Heart disease Mother   . COPD Mother   . Emphysema Mother   . Diabetes Father   . Colon cancer Neg Hx   . Colon polyps Neg Hx   . Esophageal cancer Neg Hx   . Rectal cancer Neg Hx   . Stomach cancer Neg Hx     Social History   Socioeconomic History  . Marital status: Married    Spouse name: Not on file  . Number of children: Not on file  . Years of education: Not on file  . Highest education level: Not on file  Occupational History  . Not on file  Tobacco Use  . Smoking status: Former Smoker    Years: 33.00    Types: Cigarettes    Quit date: 2008    Years since quitting: 13.2  . Smokeless tobacco: Never Used  Substance and Sexual Activity  . Alcohol use: Never    Comment: rare  . Drug use: Never  . Sexual activity: Not Currently    Birth control/protection: Post-menopausal  Other Topics Concern  . Not on file  Social History Narrative  . Not on file   Social Determinants of Health   Financial Resource Strain:   . Difficulty of Paying Living Expenses:   Food Insecurity:   . Worried  About Charity fundraiser in the Last Year:   . Arboriculturist in the Last Year:   Transportation Needs:   . Film/video editor (Medical):   Marland Kitchen Lack of Transportation (Non-Medical):   Physical Activity:   . Days of Exercise per Week:   . Minutes of Exercise per Session:   Stress:   . Feeling of Stress :   Social Connections:   . Frequency of Communication with Friends and Family:   . Frequency of Social Gatherings with Friends and Family:   . Attends Religious Services:   . Active Member of Clubs or Organizations:   . Attends Archivist Meetings:   Marland Kitchen Marital Status:   Intimate Partner Violence:   . Fear of Current or Ex-Partner:   . Emotionally Abused:   Marland Kitchen Physically Abused:   .  Sexually Abused:      Current Outpatient Medications:  .  albuterol (PROVENTIL HFA;VENTOLIN HFA) 108 (90 Base) MCG/ACT inhaler, Inhale 2 puffs into the lungs every 6 (six) hours as needed for wheezing or shortness of breath., Disp: , Rfl:  .  Ascorbic Acid (VITAMIN C) 1000 MG tablet, Take 1,000 mg by mouth every evening., Disp: , Rfl:  .  atorvastatin (LIPITOR) 20 MG tablet, Take 1 tablet (20 mg total) by mouth every evening., Disp: 90 tablet, Rfl: 0 .  busPIRone (BUSPAR) 10 MG tablet, Take 5 mg by mouth 2 (two) times daily. , Disp: , Rfl:  .  calcium carbonate (TUMS - DOSED IN MG ELEMENTAL CALCIUM) 500 MG chewable tablet, Chew 1-2 tablets by mouth daily as needed for indigestion or heartburn., Disp: , Rfl:  .  Cholecalciferol (VITAMIN D-3) 125 MCG (5000 UT) TABS, Take 5,000 Units by mouth every evening., Disp: , Rfl:  .  Cyanocobalamin (B-12) 2500 MCG TABS, Take 2,500 mcg by mouth daily., Disp: , Rfl:  .  fluconazole (DIFLUCAN) 150 MG tablet, Take 1 tablet (150 mg total) by mouth once for 1 dose., Disp: 1 tablet, Rfl: 0 .  fluticasone (FLONASE) 50 MCG/ACT nasal spray, Place 1 spray into both nostrils daily as needed for allergies or rhinitis., Disp: , Rfl:  .  ibuprofen (ADVIL,MOTRIN) 200 MG  tablet, Take 400-600 mg by mouth daily as needed for headache or moderate pain. , Disp: , Rfl:  .  LORazepam (ATIVAN) 0.5 MG tablet, Take 0.5 mg by mouth at bedtime. , Disp: , Rfl:  .  Multiple Vitamin (MULTIVITAMIN WITH MINERALS) TABS tablet, Take 1 tablet by mouth daily., Disp: , Rfl:    Allergies  Allergen Reactions  . Hydrocodone Nausea Only    "terrible headache"  . Other     UNSPECIFIED REACTION  Dog Dander   . Levaquin [Levofloxacin In D5w] Nausea And Vomiting         ROS CONSTITUTIONAL: Negative for chills, fatigue, fever, unintentional weight gain and unintentional weight loss.  E/N/T: Negative for ear pain, nasal congestion and sore throat.  CARDIOVASCULAR: Negative for chest pain, dizziness, palpitations and pedal edema.  RESPIRATORY: Negative for recent cough and dyspnea.  GASTROINTESTINAL: Negative for abdominal pain, acid reflux symptoms, constipation, diarrhea, nausea and vomiting.  GU - no urine symptoms - possible history of vaginal bleeding MSK: Negative for arthralgias and myalgias.  INTEGUMENTARY: Negative for rash.  NEUROLOGICAL: Negative for dizziness and headaches.  PSYCHIATRIC: Negative for sleep disturbance and to question depression screen.  Negative for depression, negative for anhedonia.        Objective:    PHYSICAL EXAM:   VS: BP 138/80   Pulse 61   Temp 98.3 F (36.8 C)   Resp 16   Wt 191 lb (86.6 kg)   LMP  (LMP Unknown)   SpO2 97%   BMI 31.78 kg/m   GEN: Well nourished, well developed, in no acute distress   Cardiac: RRR; no murmurs, rubs, or gallops,no edema - no significant varicosities Respiratory:  normal respiratory rate and pattern with no distress - normal breath sounds with no rales, rhonchi, wheezes or rubs  MS: no deformity or atrophy  Skin: warm and dry, no rash  Neuro:  Alert and Oriented x 3, Strength and sensation are intact - CN II-Xii grossly intact Psych: euthymic mood, appropriate affect and demeanor   Office  Visit on 10/25/2019  Component Date Value Ref Range Status  . Color, UA 10/25/2019 yellow  Final  . Clarity, UA 10/25/2019 clear   Final  . Glucose, UA 10/25/2019 Negative  Negative Final  . Bilirubin, UA 10/25/2019 ne   Final  . Ketones, UA 10/25/2019 neg   Final  . Spec Grav, UA 10/25/2019 1.015  1.010 - 1.025 Final  . Blood, UA 10/25/2019 neg   Final  . pH, UA 10/25/2019 7.5  5.0 - 8.0 Final  . Protein, UA 10/25/2019 Negative  Negative Final  . Urobilinogen, UA 10/25/2019 0.2  0.2 or 1.0 E.U./dL Final  . Nitrite, UA 10/25/2019 neg   Final  . Leukocytes, UA 10/25/2019 Negative  Negative Final  . Appearance 10/25/2019 clear   Final  . Odor 10/25/2019 none   Final    BP 138/80   Pulse 61   Temp 98.3 F (36.8 C)   Resp 16   Wt 191 lb (86.6 kg)   LMP  (LMP Unknown)   SpO2 97%   BMI 31.78 kg/m  Wt Readings from Last 3 Encounters:  10/25/19 191 lb (86.6 kg)  10/19/19 192 lb (87.1 kg)  06/12/19 190 lb (86.2 kg)     Health Maintenance Due  Topic Date Due  . Hepatitis C Screening  Never done  . HIV Screening  Never done  . TETANUS/TDAP  Never done  . PAP SMEAR-Modifier  Never done  . MAMMOGRAM  Never done  . INFLUENZA VACCINE  02/25/2019    There are no preventive care reminders to display for this patient.  No results found for: TSH Lab Results  Component Value Date   WBC 10.2 01/02/2019   HGB 12.9 01/02/2019   HCT 41.7 01/02/2019   MCV 91.9 01/02/2019   PLT 208 01/02/2019   Lab Results  Component Value Date   NA 138 01/02/2019   K 4.2 01/02/2019   CO2 29 01/02/2019   GLUCOSE 109 (H) 01/02/2019   BUN 12 01/02/2019   CREATININE 0.73 01/02/2019   BILITOT 0.8 01/01/2019   ALKPHOS 58 01/01/2019   AST 30 01/01/2019   ALT 19 01/01/2019   PROT 6.1 (L) 01/01/2019   ALBUMIN 3.5 01/01/2019   CALCIUM 9.8 01/02/2019   ANIONGAP 7 01/02/2019   GFR 74.11 07/04/2018   No results found for: CHOL No results found for: HDL No results found for: LDLCALC No results  found for: TRIG No results found for: CHOLHDL No results found for: HGBA1C    Assessment & Plan:   Problem List Items Addressed This Visit      Digestive   Carcinoid tumor of rectum    Continue follow up with specialist as directed      Relevant Orders   CBC with Differential/Platelet   Comprehensive metabolic panel   Lipid panel   TSH     Genitourinary   Gross hematuria    Recommend pt actually follow up with GYN since she is unsure whether the blood was from urine or vagina - esp given her history of carcinoid tumors Pt will make appt      Relevant Orders   POCT urinalysis dipstick (Completed)     Other   Mixed hyperlipidemia - Primary    Well controlled.  No changes to medicines.  Continue to work on eating a healthy diet and exercise.  Labs drawn today.        Relevant Orders   CBC with Differential/Platelet   Comprehensive metabolic panel   Lipid panel   TSH   Vitamin D insufficiency    Vit D level  pending      Relevant Orders   VITAMIN D 25 Hydroxy (Vit-D Deficiency, Fractures)   Anxiety    Continue current meds as directed         Meds ordered this encounter  Medications  . fluconazole (DIFLUCAN) 150 MG tablet    Sig: Take 1 tablet (150 mg total) by mouth once for 1 dose.    Dispense:  1 tablet    Refill:  0    Order Specific Question:   Supervising Provider    Answer:   Shelton Silvas    Follow-up: Return in about 6 months (around 04/25/2020) for chronic fasting follow up.  Pt will call to schedule GYN appt for further evaluation   SARA R Zacarias Krauter, PA-C

## 2019-10-25 NOTE — Assessment & Plan Note (Signed)
Recommend pt actually follow up with GYN since she is unsure whether the blood was from urine or vagina - esp given her history of carcinoid tumors Pt will make appt

## 2019-10-25 NOTE — Assessment & Plan Note (Signed)
Continue current meds as directed 

## 2019-10-25 NOTE — Assessment & Plan Note (Signed)
Well controlled.  ?No changes to medicines.  ?Continue to work on eating a healthy diet and exercise.  ?Labs drawn today.  ?

## 2019-10-26 LAB — LIPID PANEL
Chol/HDL Ratio: 2.7 ratio (ref 0.0–4.4)
Cholesterol, Total: 175 mg/dL (ref 100–199)
HDL: 66 mg/dL (ref >39)
LDL Chol Calc (NIH): 91 mg/dL (ref 0–99)
Triglycerides: 100 mg/dL (ref 0–149)
VLDL Cholesterol Cal: 18 mg/dL (ref 5–40)

## 2019-10-26 LAB — CBC WITH DIFFERENTIAL/PLATELET
Basophils Absolute: 0 10*3/uL (ref 0.0–0.2)
Basos: 0 %
EOS (ABSOLUTE): 0.3 10*3/uL (ref 0.0–0.4)
Eos: 6 %
Hematocrit: 46.3 % (ref 34.0–46.6)
Hemoglobin: 15.4 g/dL (ref 11.1–15.9)
Immature Grans (Abs): 0 10*3/uL (ref 0.0–0.1)
Immature Granulocytes: 0 %
Lymphocytes Absolute: 2.1 10*3/uL (ref 0.7–3.1)
Lymphs: 38 %
MCH: 29 pg (ref 26.6–33.0)
MCHC: 33.3 g/dL (ref 31.5–35.7)
MCV: 87 fL (ref 79–97)
Monocytes Absolute: 0.5 10*3/uL (ref 0.1–0.9)
Monocytes: 9 %
Neutrophils Absolute: 2.5 10*3/uL (ref 1.4–7.0)
Neutrophils: 47 %
Platelets: 221 10*3/uL (ref 150–450)
RBC: 5.31 x10E6/uL — ABNORMAL HIGH (ref 3.77–5.28)
RDW: 13.2 % (ref 11.7–15.4)
WBC: 5.4 10*3/uL (ref 3.4–10.8)

## 2019-10-26 LAB — COMPREHENSIVE METABOLIC PANEL
ALT: 22 IU/L (ref 0–32)
AST: 24 IU/L (ref 0–40)
Albumin/Globulin Ratio: 2.3 — ABNORMAL HIGH (ref 1.2–2.2)
Albumin: 4.5 g/dL (ref 3.8–4.9)
Alkaline Phosphatase: 70 IU/L (ref 39–117)
BUN/Creatinine Ratio: 14 (ref 9–23)
BUN: 12 mg/dL (ref 6–24)
Bilirubin Total: 0.6 mg/dL (ref 0.0–1.2)
CO2: 25 mmol/L (ref 20–29)
Calcium: 10.1 mg/dL (ref 8.7–10.2)
Chloride: 103 mmol/L (ref 96–106)
Creatinine, Ser: 0.86 mg/dL (ref 0.57–1.00)
GFR calc Af Amer: 86 mL/min/{1.73_m2} (ref >59)
GFR calc non Af Amer: 75 mL/min/{1.73_m2} (ref >59)
Globulin, Total: 2 g/dL (ref 1.5–4.5)
Glucose: 86 mg/dL (ref 65–99)
Potassium: 4.9 mmol/L (ref 3.5–5.2)
Sodium: 140 mmol/L (ref 134–144)
Total Protein: 6.5 g/dL (ref 6.0–8.5)

## 2019-10-26 LAB — CARDIOVASCULAR RISK ASSESSMENT

## 2019-10-26 LAB — TSH: TSH: 2.06 u[IU]/mL (ref 0.450–4.500)

## 2019-10-26 LAB — VITAMIN D 25 HYDROXY (VIT D DEFICIENCY, FRACTURES): Vit D, 25-Hydroxy: 48.5 ng/mL (ref 30.0–100.0)

## 2019-11-17 ENCOUNTER — Other Ambulatory Visit: Payer: Self-pay | Admitting: Physician Assistant

## 2020-02-06 ENCOUNTER — Other Ambulatory Visit: Payer: Self-pay | Admitting: Physician Assistant

## 2020-03-19 ENCOUNTER — Other Ambulatory Visit: Payer: Self-pay | Admitting: Cardiothoracic Surgery

## 2020-03-19 DIAGNOSIS — Z902 Acquired absence of lung [part of]: Secondary | ICD-10-CM

## 2020-04-12 ENCOUNTER — Other Ambulatory Visit: Payer: Self-pay | Admitting: Physician Assistant

## 2020-04-14 ENCOUNTER — Other Ambulatory Visit: Payer: Self-pay

## 2020-04-14 ENCOUNTER — Ambulatory Visit
Admission: EM | Admit: 2020-04-14 | Discharge: 2020-04-14 | Disposition: A | Discharge status: Home / Self Care | Payer: BC Managed Care – PPO | Attending: Physician Assistant | Admitting: Physician Assistant

## 2020-04-14 DIAGNOSIS — R059 Cough, unspecified: Secondary | ICD-10-CM

## 2020-04-14 DIAGNOSIS — R05 Cough: Secondary | ICD-10-CM

## 2020-04-14 DIAGNOSIS — R0602 Shortness of breath: Secondary | ICD-10-CM

## 2020-04-14 DIAGNOSIS — R0981 Nasal congestion: Secondary | ICD-10-CM

## 2020-04-14 MED ORDER — BENZONATATE 200 MG PO CAPS: 200.0000 mg | ORAL_CAPSULE | Freq: Three times a day (TID) | ORAL | 0 refills | Status: DC

## 2020-04-14 MED ORDER — DOXYCYCLINE HYCLATE 100 MG PO CAPS: 100.0000 mg | ORAL_CAPSULE | Freq: Two times a day (BID) | ORAL | 0 refills | Status: DC

## 2020-04-14 MED ORDER — HYDROCODONE-HOMATROPINE 5-1.5 MG/5ML PO SYRP: 5.0000 mL | ORAL_SOLUTION | Freq: Four times a day (QID) | ORAL | 0 refills | Status: DC | PRN

## 2020-04-14 NOTE — ED Triage Notes (Signed)
Pt c/o green>yellow productive cough, sore throat and congestion that started x 4 days ago.

## 2020-04-14 NOTE — ED Provider Notes (Signed)
[facility]    CSN: [id number] Arrival date & time: 04/14/20  1450      History   Chief Complaint Chief Complaint  Patient presents with  . Cough  . Nasal Congestion  . Shortness of Breath    HPI Shannon [name] is a 59 y.o. female.   59 year old female with history of COPD, lung cancer s/p right middle lobectomy without chemo/radiation comes in for 4 day of URI symptoms. Productive cough, sore throat, nasal congestion. Denies fever, chills, body aches. Denies abdominal pain, nausea, vomiting, diarrhea. Denies loss of taste/smell. Had some shortness of breath, which resolved after albuterol. Former smoker.     Past Medical History:  Diagnosis Date  . Anxiety   . Arthritis   . Asthma   . Cancer ([place])    rectal, Lung  . Dyspnea    with exertion  . Emphysema of lung ([place])    patient denies  . History of kidney stones    passed  . HLD (hyperlipidemia)   . Pneumonia 02/2018  . Rectal tumor    cancer  . Seasonal allergies     Patient Active Problem List   Diagnosis Date Noted  . Mixed hyperlipidemia 10/25/2019  . Vitamin D insufficiency 10/25/2019  . Gross hematuria 10/25/2019  . Anxiety 10/25/2019  . 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 S/P lobectomy of lung 12/30/2018  . 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 Abnormal colonoscopy 07/07/2018    Past Surgical History:  Procedure Laterality Date  . ABLATION     endometrial  . BIOPSY  10/10/2018   Procedure: BIOPSY;  Surgeon: Rush Landmark Telford Nab., MD;  Location: [place];  Service: Gastroenterology;;  . BIOPSY  06/12/2019   Procedure: BIOPSY;  Surgeon: Irving Copas., MD;  Location: [place];  Service: Gastroenterology;;  . COLONOSCOPY    . DOPPLER ECHOCARDIOGRAPHY    . ENDOSCOPIC MUCOSAL RESECTION N/A 10/10/2018   Procedure: ENDOSCOPIC MUCOSAL RESECTION;  Surgeon: Rush Landmark Telford Nab., MD;  Location: [place];  Service: Gastroenterology;  Laterality: N/A;  . EUS N/A 07/18/2018   Procedure: LOWER ENDOSCOPIC ULTRASOUND (EUS);  Surgeon: Irving Copas., MD;  Location: [place];  Service: Gastroenterology;  Laterality: N/A;  . EUS N/A 10/10/2018   Procedure: LOWER ENDOSCOPIC ULTRASOUND (EUS);  Surgeon: Irving Copas., MD;  Location: [place];  Service: Gastroenterology;  Laterality: N/A;  . EUS N/A 06/12/2019   Procedure: LOWER ENDOSCOPIC ULTRASOUND (EUS);  Surgeon: Irving Copas., MD;  Location: [place];  Service: Gastroenterology;  Laterality: N/A;  . FLEXIBLE SIGMOIDOSCOPY N/A 07/18/2018   Procedure: FLEXIBLE SIGMOIDOSCOPY;  Surgeon: Irving Copas., MD;  Location: [place];  Service: Gastroenterology;  Laterality: N/A;  . FLEXIBLE SIGMOIDOSCOPY N/A 10/10/2018   Procedure: FLEXIBLE SIGMOIDOSCOPY;  Surgeon: Rush Landmark Telford Nab., MD;  Location: [place];  Service: Gastroenterology;  Laterality: N/A;  . FLEXIBLE SIGMOIDOSCOPY N/A 06/12/2019   Procedure: FLEXIBLE SIGMOIDOSCOPY;  Surgeon: Irving Copas., MD;  Location: [place];  Service: Gastroenterology;  Laterality: N/A;  . HEMOSTASIS CLIP PLACEMENT  10/10/2018   Procedure: HEMOSTASIS CLIP PLACEMENT;  Surgeon: Irving Copas., MD;  Location: [place];  Service: Gastroenterology;;  . LUNG CANCER SURGERY  12/30/2018   right lun- mid lobe , dr gerhardt  . SUBMUCOSAL LIFTING INJECTION  10/10/2018   Procedure: SUBMUCOSAL LIFTING INJECTION;  Surgeon: Rush Landmark Telford Nab., MD;  Location: [place];  Service: Gastroenterology;;  . TEE WITHOUT  CARDIOVERSION  02/2018  . TONSILLECTOMY    . TUBAL LIGATION  1987  . VIDEO ASSISTED THORACOSCOPY (VATS)/WEDGE RESECTION Right 12/30/2018   Procedure: VIDEO ASSISTED THORACOSCOPY (VATS), MINI THORACOTOMY, RIGHT MIDDLE LOBECTOMY WITH LYMPH NODE DISSECTION;  Surgeon: Grace Isaac, MD;  Location: [place];  Service: Thoracic;   Laterality: Right;  Marland Kitchen VIDEO BRONCHOSCOPY N/A 10/12/2018   Procedure: VIDEO BRONCHOSCOPY;  Surgeon: Grace Isaac, MD;  Location: [place];  Service: Thoracic;  Laterality: N/A;  . VIDEO BRONCHOSCOPY N/A 12/30/2018   Procedure: VIDEO BRONCHOSCOPY;  Surgeon: Grace Isaac, MD;  Location: [facility] OR;  Service: Thoracic;  Laterality: N/A;  . WISDOM TOOTH EXTRACTION      OB History   No obstetric history on file.      Home Medications    Prior to Admission medications   Medication Sig Start Date End Date Taking? Authorizing Provider  albuterol (PROVENTIL HFA;VENTOLIN HFA) 108 (90 Base) MCG/ACT inhaler Inhale 2 puffs into the lungs every 6 (six) hours as needed for wheezing or shortness of breath.    [provider]  Ascorbic Acid (VITAMIN C) 1000 MG tablet Take 1,000 mg by mouth every evening.    [provider]  atorvastatin (LIPITOR) 20 MG tablet TAKE 1 TABLET BY MOUTH EVERY DAY 04/12/20   Marge Duncans, PA-C  benzonatate (TESSALON) 200 MG capsule Take 1 capsule (200 mg total) by mouth every 8 (eight) hours. 04/14/20   Tasia Catchings, Delmon Andrada V, PA-C  busPIRone (BUSPAR) 10 MG tablet TAKE 1/2 TABLET BY MOUTH TWICE A DAY 04/12/20   Marge Duncans, PA-C  calcium carbonate (TUMS - DOSED IN MG ELEMENTAL CALCIUM) 500 MG chewable tablet Chew 1-2 tablets by mouth daily as needed for indigestion or heartburn.    [provider]  Cholecalciferol (VITAMIN D-3) 125 MCG (5000 UT) TABS Take 5,000 Units by mouth every evening.    [provider]  Cyanocobalamin (B-12) 2500 MCG TABS Take 2,500 mcg by mouth daily.    [provider]  doxycycline (VIBRAMYCIN) 100 MG capsule Take 1 capsule (100 mg total) by mouth 2 (two) times daily. 04/14/20   Tasia Catchings, Anaisabel Pederson V, PA-C  fluticasone (FLONASE) 50 MCG/ACT nasal spray Place 1 spray into both nostrils daily as needed for allergies or rhinitis.    [provider]  HYDROcodone-homatropine (HYCODAN) 5-1.5 MG/5ML syrup Take 5 mLs by mouth every 6  (six) hours as needed for cough. 04/14/20   Tasia Catchings, Jahira Swiss V, PA-C  ibuprofen (ADVIL,MOTRIN) 200 MG tablet Take 400-600 mg by mouth daily as needed for headache or moderate pain.     [provider]  LORazepam (ATIVAN) 0.5 MG tablet TAKE 1 TABLET BY MOUTH TWICE A DAY AS NEEDED FOR ANXIETY 04/12/20   Marge Duncans, PA-C  Multiple Vitamin (MULTIVITAMIN WITH MINERALS) TABS tablet Take 1 tablet by mouth daily.    [provider]    Family History Family History  Problem Relation Age of Onset  . Heart disease Mother   . COPD Mother   . Emphysema Mother   . Diabetes Father   . Colon cancer Neg Hx   . Colon polyps Neg Hx   . Esophageal cancer Neg Hx   . Rectal cancer Neg Hx   . Stomach cancer Neg Hx     Social History Social History   Tobacco Use  . Smoking status: Former Smoker    Years: 33.00    Types: Cigarettes    Quit date: 2008    Years since  quitting: 13.7  . Smokeless tobacco: Never Used  Vaping Use  . Vaping Use: Never used  Substance Use Topics  . Alcohol use: Never    Comment: rare  . Drug use: Never     Allergies   Hydrocodone, Other, and Levaquin [levofloxacin in d5w]   Review of Systems Review of Systems  Reason unable to perform ROS: See HPI as above.     Physical Exam Triage Vital Signs ED Triage Vitals  Enc Vitals Group     BP 04/14/20 1514 (!) 163/84     Pulse Rate 04/14/20 1514 75     Resp 04/14/20 1514 14     Temp 04/14/20 1514 98.7 F (37.1 C)     Temp Source 04/14/20 1514 Oral     SpO2 04/14/20 1514 97 %     Weight --      Height --      Head Circumference --      Peak Flow --      Pain Score 04/14/20 1543 0     Pain Loc --      Pain Edu? --      Excl. in [place]? --    No data found.  Updated Vital Signs BP (!) 163/84 (BP Location: Left Arm)   Pulse 75   Temp 98.7 F (37.1 C) (Oral)   Resp 14   LMP  (LMP Unknown)   SpO2 97%   Physical Exam Constitutional:      General: She is not in acute distress.    Appearance:  Normal appearance. She is not ill-appearing, toxic-appearing or diaphoretic.  HENT:     Head: Normocephalic and atraumatic.     Mouth/Throat:     Mouth: Mucous membranes are moist.     Pharynx: Oropharynx is clear. Uvula midline.  Cardiovascular:     Rate and Rhythm: Normal rate and regular rhythm.     Heart sounds: Normal heart sounds. No murmur heard.  No friction rub. No gallop.   Pulmonary:     Effort: Pulmonary effort is normal. No accessory muscle usage, prolonged expiration, respiratory distress or retractions.     Comments: Lungs clear to auscultation without adventitious lung sounds. Musculoskeletal:     Cervical back: Normal range of motion and neck supple.  Neurological:     General: No focal deficit present.     Mental Status: She is alert and oriented to person, place, and time.      UC Treatments / Results  Labs (all labs ordered are listed, but only abnormal results are displayed) Labs Reviewed  NOVEL CORONAVIRUS, NAA    EKG   Radiology No results found.  Procedures Procedures (including critical care time)  Medications Ordered in UC Medications - No data to display  Initial Impression / Assessment and Plan / UC Course  I have reviewed the triage vital signs and the nursing notes.  Pertinent labs & imaging results that were available during my care of the patient were reviewed by me and considered in my medical decision making (see chart for details).    COVID testing ordered. Start doxycyline as directed. Other symptomatic treatment discussed. If symptoms not improving, may need prednisone use. Return precautions given.  Final Clinical Impressions(s) / UC Diagnoses   Final diagnoses:  Cough  Nasal congestion  Shortness of breath    ED Prescriptions    Medication Sig Dispense Auth. Provider   benzonatate (TESSALON) 200 MG capsule Take 1 capsule (200 mg total) by mouth every  8 (eight) hours. 21 capsule Tasia Catchings, Riley Hallum V, PA-C   doxycycline  (VIBRAMYCIN) 100 MG capsule Take 1 capsule (100 mg total) by mouth 2 (two) times daily. 14 capsule Pallie Swigert V, PA-C   HYDROcodone-homatropine (HYCODAN) 5-1.5 MG/5ML syrup Take 5 mLs by mouth every 6 (six) hours as needed for cough. 60 mL Tasia Catchings, Naksh Radi V, PA-C     I have reviewed the PDMP during this encounter.   Ok Edwards, PA-C 04/14/20 1851

## 2020-04-14 NOTE — Discharge Instructions (Signed)
COVID PCR testing ordered. I would like you to quarantine until testing results. Tessalon for cough. Hycodan as needed for cough at night. Albuterol as directed. You can take over the counter flonase/nasacort to help with nasal congestion/drainage. Tylenol/motrin for pain and fever. Keep hydrated, urine should be clear to pale yellow in color. If experiencing worsening shortness of breath, trouble breathing, go to the emergency department for further evaluation needed.

## 2020-04-15 LAB — SARS-COV-2, NAA 2 DAY TAT

## 2020-04-15 LAB — NOVEL CORONAVIRUS, NAA: SARS-CoV-2, NAA: NOT DETECTED

## 2020-04-18 ENCOUNTER — Ambulatory Visit: Payer: BC Managed Care – PPO | Admitting: Cardiothoracic Surgery

## 2020-04-25 ENCOUNTER — Ambulatory Visit: Payer: BC Managed Care – PPO | Admitting: Physician Assistant

## 2020-04-30 ENCOUNTER — Encounter: Payer: Self-pay | Admitting: Physician Assistant

## 2020-04-30 ENCOUNTER — Ambulatory Visit (INDEPENDENT_AMBULATORY_CARE_PROVIDER_SITE_OTHER): Payer: BC Managed Care – PPO | Admitting: Physician Assistant

## 2020-04-30 ENCOUNTER — Other Ambulatory Visit: Payer: Self-pay

## 2020-04-30 VITALS — BP 138/84 | HR 66 | Temp 97.8°F | Ht 65.0 in | Wt 189.0 lb

## 2020-04-30 DIAGNOSIS — R5383 Other fatigue: Secondary | ICD-10-CM | POA: Diagnosis not present

## 2020-04-30 DIAGNOSIS — E559 Vitamin D deficiency, unspecified: Secondary | ICD-10-CM

## 2020-04-30 DIAGNOSIS — Z23 Encounter for immunization: Secondary | ICD-10-CM | POA: Insufficient documentation

## 2020-04-30 DIAGNOSIS — Z01810 Encounter for preprocedural cardiovascular examination: Secondary | ICD-10-CM | POA: Insufficient documentation

## 2020-04-30 DIAGNOSIS — E782 Mixed hyperlipidemia: Secondary | ICD-10-CM | POA: Diagnosis not present

## 2020-04-30 DIAGNOSIS — Z1159 Encounter for screening for other viral diseases: Secondary | ICD-10-CM | POA: Diagnosis not present

## 2020-04-30 NOTE — Assessment & Plan Note (Signed)
flucelvax given

## 2020-04-30 NOTE — Assessment & Plan Note (Signed)
Well controlled.  ?No changes to medicines.  ?Continue to work on eating a healthy diet and exercise.  ?Labs drawn today.  ?

## 2020-04-30 NOTE — Assessment & Plan Note (Signed)
labwork pending 

## 2020-04-30 NOTE — Progress Notes (Signed)
Established Patient Office Visit  Subjective:  Patient ID: Shannon [name], female    DOB: [birth year]  Age: 59 y.o. MRN: [id number]  CC:  Chief Complaint  Patient presents with  . Hypertension    6 months fasting    HPI Shannon [name] presents for follow up hyperlipidemia  Pt presents with hyperlipidemia.  Compliance with treatment has been good; she does follow up as directed and tries to  She denies experiencing any hypercholesterolemia related symptoms.  Currently taking lipitor 20mg  qd    Follow up of generalized anxiety disorder.  pt doing well on her current medications she takes buspar 10mg  1/2 bid and ativan as needed - says controlling symptoms well    Follow up of personal history of malignant carcinoid tumor of rectum.  pt was found to have carcinoid tumor of rectum on screening colonoscopy which was removed 07/2018 - she follows with Dr  Rush Landmark and just had appt with him - to follow up in one year She follows with Dr Pia Mau regularly  Past Medical History:  Diagnosis Date  . Anxiety   . Arthritis   . Asthma   . Cancer ([place])    rectal, Lung  . Dyspnea    with exertion  . Emphysema of lung ([place])    patient denies  . History of kidney stones    passed  . HLD (hyperlipidemia)   . Pneumonia 02/2018  . Rectal tumor    cancer  . Seasonal allergies     Past Surgical History:  Procedure Laterality Date  . ABLATION     endometrial  . BIOPSY  10/10/2018   Procedure: BIOPSY;  Surgeon: Rush Landmark Telford Nab., MD;  Location: [place];  Service: Gastroenterology;;  . BIOPSY  06/12/2019   Procedure: BIOPSY;  Surgeon: Irving Copas., MD;  Location: [place];  Service: Gastroenterology;;  . COLONOSCOPY    . DOPPLER ECHOCARDIOGRAPHY    . ENDOSCOPIC MUCOSAL RESECTION N/A 10/10/2018   Procedure: ENDOSCOPIC MUCOSAL RESECTION;  Surgeon: Rush Landmark Telford Nab., MD;  Location: [place];  Service: Gastroenterology;  Laterality: N/A;  . EUS N/A  07/18/2018   Procedure: LOWER ENDOSCOPIC ULTRASOUND (EUS);  Surgeon: Irving Copas., MD;  Location: [place];  Service: Gastroenterology;  Laterality: N/A;  . EUS N/A 10/10/2018   Procedure: LOWER ENDOSCOPIC ULTRASOUND (EUS);  Surgeon: Irving Copas., MD;  Location: [place];  Service: Gastroenterology;  Laterality: N/A;  . EUS N/A 06/12/2019   Procedure: LOWER ENDOSCOPIC ULTRASOUND (EUS);  Surgeon: Irving Copas., MD;  Location: [place];  Service: Gastroenterology;  Laterality: N/A;  . FLEXIBLE SIGMOIDOSCOPY N/A 07/18/2018   Procedure: FLEXIBLE SIGMOIDOSCOPY;  Surgeon: Irving Copas., MD;  Location: [place];  Service: Gastroenterology;  Laterality: N/A;  . FLEXIBLE SIGMOIDOSCOPY N/A 10/10/2018   Procedure: FLEXIBLE SIGMOIDOSCOPY;  Surgeon: Rush Landmark Telford Nab., MD;  Location: [place];  Service: Gastroenterology;  Laterality: N/A;  . FLEXIBLE SIGMOIDOSCOPY N/A 06/12/2019   Procedure: FLEXIBLE SIGMOIDOSCOPY;  Surgeon: Irving Copas., MD;  Location: [place];  Service: Gastroenterology;  Laterality: N/A;  . HEMOSTASIS CLIP PLACEMENT  10/10/2018   Procedure: HEMOSTASIS CLIP PLACEMENT;  Surgeon: Irving Copas., MD;  Location: [place];  Service: Gastroenterology;;  . LUNG CANCER SURGERY  12/30/2018   right lun- mid lobe , dr gerhardt  . SUBMUCOSAL LIFTING INJECTION  10/10/2018   Procedure: SUBMUCOSAL LIFTING INJECTION;  Surgeon: Rush Landmark Telford Nab., MD;  Location: [place];  Service: Gastroenterology;;  . TEE WITHOUT CARDIOVERSION  02/2018  . TONSILLECTOMY    . TUBAL LIGATION  1987  . VIDEO ASSISTED THORACOSCOPY (VATS)/WEDGE RESECTION Right 12/30/2018   Procedure: VIDEO ASSISTED THORACOSCOPY (VATS), MINI THORACOTOMY, RIGHT MIDDLE LOBECTOMY WITH LYMPH NODE DISSECTION;  Surgeon: Grace Isaac, MD;  Location: [place];  Service: Thoracic;  Laterality: Right;  Marland Kitchen VIDEO BRONCHOSCOPY N/A 10/12/2018   Procedure:  VIDEO BRONCHOSCOPY;  Surgeon: Grace Isaac, MD;  Location: [place];  Service: Thoracic;  Laterality: N/A;  . VIDEO BRONCHOSCOPY N/A 12/30/2018   Procedure: VIDEO BRONCHOSCOPY;  Surgeon: Grace Isaac, MD;  Location: [facility] OR;  Service: Thoracic;  Laterality: N/A;  . WISDOM TOOTH EXTRACTION      Family History  Problem Relation Age of Onset  . Heart disease Mother   . COPD Mother   . Emphysema Mother   . Diabetes Father   . Colon cancer Neg Hx   . Colon polyps Neg Hx   . Esophageal cancer Neg Hx   . Rectal cancer Neg Hx   . Stomach cancer Neg Hx     Social History   Socioeconomic History  . Marital status: Married    Spouse name: Not on file  . Number of children: Not on file  . Years of education: Not on file  . Highest education level: Not on file  Occupational History  . Not on file  Tobacco Use  . Smoking status: Former Smoker    Years: 33.00    Types: Cigarettes    Quit date: 2008    Years since quitting: 13.7  . Smokeless tobacco: Never Used  Vaping Use  . Vaping Use: Never used  Substance and Sexual Activity  . Alcohol use: Never    Comment: rare  . Drug use: Never  . Sexual activity: Not Currently    Birth control/protection: Post-menopausal  Other Topics Concern  . Not on file  Social History Narrative  . Not on file   Social Determinants of Health   Financial Resource Strain:   . Difficulty of Paying Living Expenses: Not on file  Food Insecurity:   . Worried About Charity fundraiser in the Last Year: Not on file  . Ran Out of Food in the Last Year: Not on file  Transportation Needs:   . Lack of Transportation (Medical): Not on file  . Lack of Transportation (Non-Medical): Not on file  Physical Activity:   . Days of Exercise per Week: Not on file  . Minutes of Exercise per Session: Not on file  Stress:   . Feeling of Stress : Not on file  Social Connections:   . Frequency of Communication with Friends and Family: Not on file  . Frequency  of Social Gatherings with Friends and Family: Not on file  . Attends Religious Services: Not on file  . Active Member of Clubs or Organizations: Not on file  . Attends Archivist Meetings: Not on file  . Marital Status: Not on file  Intimate Partner Violence:   . Fear of Current or Ex-Partner: Not on file  . Emotionally Abused: Not on file  . Physically Abused: Not on file  . Sexually Abused: Not on file     Current Outpatient Medications:  .  albuterol (PROVENTIL HFA;VENTOLIN HFA) 108 (90 Base) MCG/ACT inhaler, Inhale 2 puffs into the lungs every 6 (six) hours as needed for wheezing or shortness of breath., Disp: , Rfl:  .  Ascorbic Acid (VITAMIN C) 1000 MG tablet, Take 1,000 mg  by mouth every evening., Disp: , Rfl:  .  atorvastatin (LIPITOR) 20 MG tablet, TAKE 1 TABLET BY MOUTH EVERY DAY, Disp: 90 tablet, Rfl: 0 .  busPIRone (BUSPAR) 10 MG tablet, TAKE 1/2 TABLET BY MOUTH TWICE A DAY, Disp: 90 tablet, Rfl: 1 .  Cholecalciferol (VITAMIN D-3) 125 MCG (5000 UT) TABS, Take 5,000 Units by mouth every evening., Disp: , Rfl:  .  Cyanocobalamin (B-12) 2500 MCG TABS, Take 2,500 mcg by mouth daily., Disp: , Rfl:  .  fluticasone (FLONASE) 50 MCG/ACT nasal spray, Place 1 spray into both nostrils daily as needed for allergies or rhinitis., Disp: , Rfl:  .  ibuprofen (ADVIL,MOTRIN) 200 MG tablet, Take 400-600 mg by mouth daily as needed for headache or moderate pain. , Disp: , Rfl:  .  LORazepam (ATIVAN) 0.5 MG tablet, TAKE 1 TABLET BY MOUTH TWICE A DAY AS NEEDED FOR ANXIETY, Disp: 60 tablet, Rfl: 1 .  meloxicam (MOBIC) 7.5 MG tablet, meloxicam 7.5 mg tablet  TAKE 1 TABLET BY MOUTH ONCE TO TWICE A DAY AS NEEDED, Disp: , Rfl:  .  Multiple Vitamin (MULTIVITAMIN WITH MINERALS) TABS tablet, Take 1 tablet by mouth daily., Disp: , Rfl:    Allergies  Allergen Reactions  . Hydrocodone Nausea Only    "terrible headache"  . Other     UNSPECIFIED REACTION  Dog Dander   . Levaquin [Levofloxacin  In D5w] Nausea And Vomiting         ROS CONSTITUTIONAL: Negative for chills, fatigue, fever, unintentional weight gain and unintentional weight loss.  E/N/T: Negative for ear pain, nasal congestion and sore throat.  CARDIOVASCULAR: Negative for chest pain, dizziness, palpitations and pedal edema.  RESPIRATORY: Negative for recent cough and dyspnea.  GASTROINTESTINAL: Negative for abdominal pain, acid reflux symptoms, constipation, diarrhea, nausea and vomiting.  GU - no urine symptoms - possible history of vaginal bleeding MSK: Negative for arthralgias and myalgias.  INTEGUMENTARY: Negative for rash.  NEUROLOGICAL: Negative for dizziness and headaches.  PSYCHIATRIC: Negative for sleep disturbance and to question depression screen.  Negative for depression, negative for anhedonia.        Objective:    PHYSICAL EXAM:   VS: BP 138/84 (BP Location: Left Arm, Patient Position: Sitting)   Pulse 66   Temp 97.8 F (36.6 C) (Temporal)   Ht 5\' 5"  (1.651 m)   Wt 189 lb (85.7 kg)   LMP  (LMP Unknown)   SpO2 100%   BMI 31.45 kg/m   GEN: Well nourished, well developed, in no acute distress   Cardiac: RRR; no murmurs, rubs, or gallops,no edema - no significant varicosities Respiratory:  normal respiratory rate and pattern with no distress - normal breath sounds with no rales, rhonchi, wheezes or rubs  MS: no deformity or atrophy  Skin: warm and dry, no rash  Neuro:  Alert and Oriented x 3, Strength and sensation are intact - CN II-Xii grossly intact Psych: euthymic mood, appropriate affect and demeanor   No visits with results within 1 Day(s) from this visit.  Latest known visit with results is:  Admission on 04/14/2020, Discharged on 04/14/2020  Component Date Value Ref Range Status  . SARS-CoV-2, NAA 04/14/2020 Not Detected  Not Detected Final   Comment: This nucleic acid amplification test was developed and its performance characteristics determined by [employer].  Nucleic acid amplification tests include RT-PCR and TMA. This test has not been FDA cleared or approved. This test has been authorized by FDA under an Emergency Use  Authorization (EUA). This test is only authorized for the duration of time the declaration that circumstances exist justifying the authorization of the emergency use of in vitro diagnostic tests for detection of SARS-CoV-2 virus and/or diagnosis of COVID-19 infection under section 564(b)(1) of the Act, 21 U.S.C. 767MCN-4(B) (1), unless the authorization is terminated or revoked sooner. When diagnostic testing is negative, the possibility of a false negative result should be considered in the context of a patient's recent exposures and the presence of clinical signs and symptoms consistent with COVID-19. An individual without symptoms of COVID-19 and who is not shedding SARS-CoV-2 virus wo                          uld expect to have a negative (not detected) result in this assay.   Marland Kitchen SARS-CoV-2, NAA 2 DAY TAT 04/14/2020 Performed   Final    BP 138/84 (BP Location: Left Arm, Patient Position: Sitting)   Pulse 66   Temp 97.8 F (36.6 C) (Temporal)   Ht 5\' 5"  (1.651 m)   Wt 189 lb (85.7 kg)   LMP  (LMP Unknown)   SpO2 100%   BMI 31.45 kg/m  Wt Readings from Last 3 Encounters:  04/30/20 189 lb (85.7 kg)  10/25/19 191 lb (86.6 kg)  10/19/19 192 lb (87.1 kg)     Health Maintenance Due  Topic Date Due  . Hepatitis C Screening  Never done  . PAP SMEAR-Modifier  Never done  . MAMMOGRAM  Never done    There are no preventive care reminders to display for this patient.  Lab Results  Component Value Date   TSH 2.060 10/25/2019   Lab Results  Component Value Date   WBC 5.4 10/25/2019   HGB 15.4 10/25/2019   HCT 46.3 10/25/2019   MCV 87 10/25/2019   PLT 221 10/25/2019   Lab Results  Component Value Date   NA 140 10/25/2019   K 4.9 10/25/2019   CO2 25 10/25/2019   GLUCOSE 86 10/25/2019   BUN 12 10/25/2019    CREATININE 0.86 10/25/2019   BILITOT 0.6 10/25/2019   ALKPHOS 70 10/25/2019   AST 24 10/25/2019   ALT 22 10/25/2019   PROT 6.5 10/25/2019   ALBUMIN 4.5 10/25/2019   CALCIUM 10.1 10/25/2019   ANIONGAP 7 01/02/2019   GFR 74.11 07/04/2018   Lab Results  Component Value Date   CHOL 175 10/25/2019   Lab Results  Component Value Date   HDL 66 10/25/2019   Lab Results  Component Value Date   LDLCALC 91 10/25/2019   Lab Results  Component Value Date   TRIG 100 10/25/2019   Lab Results  Component Value Date   CHOLHDL 2.7 10/25/2019   No results found for: HGBA1C    Assessment & Plan:   Problem List Items Addressed This Visit      Other   Mixed hyperlipidemia - Primary    Well controlled.  No changes to medicines.  Continue to work on eating a healthy diet and exercise.  Labs drawn today.        Relevant Orders   Lipid panel   Vitamin D insufficiency    labwork pending      Relevant Orders   VITAMIN D 25 Hydroxy (Vit-D Deficiency, Fractures)   Encounter for hepatitis C screening test for low risk patient    labwork pending      Relevant Orders   Hepatitis C antibody  Other fatigue    labwork pending      Relevant Orders   CBC with Differential/Platelet   Comprehensive metabolic panel   TSH   Need for prophylactic vaccination and inoculation against influenza    flucelvax given      Relevant Orders   Flu Vaccine MDCK QUAD PF (Completed)      No orders of the defined types were placed in this encounter.   Follow-up: Return in about 6 months (around 10/29/2020) for chronic fasting.  Pt will call to schedule GYN appt for further evaluation   SARA R Padraic Marinos, PA-C

## 2020-05-01 LAB — CBC WITH DIFFERENTIAL/PLATELET
Basophils Absolute: 0 10*3/uL (ref 0.0–0.2)
Basos: 1 %
EOS (ABSOLUTE): 0.4 10*3/uL (ref 0.0–0.4)
Eos: 7 %
Hematocrit: 44.8 % (ref 34.0–46.6)
Hemoglobin: 15 g/dL (ref 11.1–15.9)
Immature Grans (Abs): 0 10*3/uL (ref 0.0–0.1)
Immature Granulocytes: 0 %
Lymphocytes Absolute: 1.8 10*3/uL (ref 0.7–3.1)
Lymphs: 29 %
MCH: 28.9 pg (ref 26.6–33.0)
MCHC: 33.5 g/dL (ref 31.5–35.7)
MCV: 86 fL (ref 79–97)
Monocytes Absolute: 0.5 10*3/uL (ref 0.1–0.9)
Monocytes: 8 %
Neutrophils Absolute: 3.4 10*3/uL (ref 1.4–7.0)
Neutrophils: 55 %
Platelets: 242 10*3/uL (ref 150–450)
RBC: 5.19 x10E6/uL (ref 3.77–5.28)
RDW: 13.1 % (ref 11.7–15.4)
WBC: 6.2 10*3/uL (ref 3.4–10.8)

## 2020-05-01 LAB — COMPREHENSIVE METABOLIC PANEL
ALT: 22 IU/L (ref 0–32)
AST: 20 IU/L (ref 0–40)
Albumin/Globulin Ratio: 2.4 — ABNORMAL HIGH (ref 1.2–2.2)
Albumin: 4.7 g/dL (ref 3.8–4.9)
Alkaline Phosphatase: 73 IU/L (ref 44–121)
BUN/Creatinine Ratio: 19 (ref 9–23)
BUN: 16 mg/dL (ref 6–24)
Bilirubin Total: 0.6 mg/dL (ref 0.0–1.2)
CO2: 27 mmol/L (ref 20–29)
Calcium: 10.4 mg/dL — ABNORMAL HIGH (ref 8.7–10.2)
Chloride: 103 mmol/L (ref 96–106)
Creatinine, Ser: 0.86 mg/dL (ref 0.57–1.00)
GFR calc Af Amer: 86 mL/min/{1.73_m2} (ref >59)
GFR calc non Af Amer: 74 mL/min/{1.73_m2} (ref >59)
Globulin, Total: 2 g/dL (ref 1.5–4.5)
Glucose: 90 mg/dL (ref 65–99)
Potassium: 5.1 mmol/L (ref 3.5–5.2)
Sodium: 141 mmol/L (ref 134–144)
Total Protein: 6.7 g/dL (ref 6.0–8.5)

## 2020-05-01 LAB — HEPATITIS C ANTIBODY: Hep C Virus Ab: <0.1 s/co ratio (ref 0.0–0.9)

## 2020-05-01 LAB — LIPID PANEL
Chol/HDL Ratio: 2.7 ratio (ref 0.0–4.4)
Cholesterol, Total: 175 mg/dL (ref 100–199)
HDL: 64 mg/dL (ref >39)
LDL Chol Calc (NIH): 94 mg/dL (ref 0–99)
Triglycerides: 95 mg/dL (ref 0–149)
VLDL Cholesterol Cal: 17 mg/dL (ref 5–40)

## 2020-05-01 LAB — VITAMIN D 25 HYDROXY (VIT D DEFICIENCY, FRACTURES): Vit D, 25-Hydroxy: 45.2 ng/mL (ref 30.0–100.0)

## 2020-05-01 LAB — TSH: TSH: 1.86 u[IU]/mL (ref 0.450–4.500)

## 2020-05-01 LAB — CARDIOVASCULAR RISK ASSESSMENT

## 2020-05-02 ENCOUNTER — Other Ambulatory Visit: Payer: Self-pay

## 2020-05-02 ENCOUNTER — Encounter: Payer: Self-pay | Admitting: Cardiothoracic Surgery

## 2020-05-02 ENCOUNTER — Ambulatory Visit
Admission: RE | Admit: 2020-05-02 | Discharge: 2020-05-02 | Disposition: A | Discharge status: Home / Self Care | Payer: BC Managed Care – PPO | Source: Ambulatory Visit | Attending: Cardiothoracic Surgery | Admitting: Cardiothoracic Surgery

## 2020-05-02 ENCOUNTER — Ambulatory Visit: Payer: BC Managed Care – PPO | Admitting: Cardiothoracic Surgery

## 2020-05-02 ENCOUNTER — Encounter: Payer: Self-pay | Admitting: Physician Assistant

## 2020-05-02 VITALS — BP 143/88 | HR 69 | Temp 97.0°F | Resp 18 | Ht 65.0 in | Wt 190.0 lb

## 2020-05-02 DIAGNOSIS — C7A090 Malignant carcinoid tumor of the bronchus and lung: Secondary | ICD-10-CM | POA: Diagnosis not present

## 2020-05-02 DIAGNOSIS — Z902 Acquired absence of lung [part of]: Secondary | ICD-10-CM

## 2020-05-02 NOTE — Progress Notes (Signed)
[facility] Record #[id number] Date of Birth: 04/17/1961  Referring: Irving Copas.* Primary Care: Marge Duncans, PA-C Primary Cardiologist: No primary care provider on file.   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 POSTOPERATIVE DIAGNOSIS:  Carcinoid right middle lobe.   PROCEDURE PERFORMED:   1.  Video bronchoscopy. 2.  Right video-assisted thoracoscopy. 3.  Mini thoracotomy.   4.  Right middle lobectomy with primary hand sewn suture closure of the middle lobe bronchus.  Cancer Staging Carcinoid bronchial adenoma, right ([place]) Staging form: Lung, AJCC 8th Edition - Clinical: cT1, cN0, cM0 - Signed by Grace Isaac, MD on 04/14/2019 - Pathologic stage from 04/14/2019: pT1, pN0, cM0 - Signed by Grace Isaac, MD on 04/14/2019  History of Present Illness:     Patient comes in today with a follow-up CT of the chest.  16 months ago the patient had a right middle lobe resection for carcinoid.  She continues to do well without sequelae.  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   Patient notes she has avoided any exposure or symptoms of Covid, she is now had Covid vaccination x2  Social History   Tobacco Use  Smoking Status Former Smoker  . Years: 33.00  . Types: Cigarettes  . Quit date: 2008  . Years since quitting: 13.7  Smokeless Tobacco Never Used    Social History   Substance and Sexual Activity  Alcohol Use Never   Comment: rare     Allergies  Allergen Reactions  . Hydrocodone Nausea Only    "terrible headache"  . Other     UNSPECIFIED REACTION  Dog Dander   . Levaquin [Levofloxacin In D5w] Nausea And Vomiting         Current Outpatient Medications  Medication Sig Dispense Refill  . albuterol (PROVENTIL HFA;VENTOLIN HFA) 108 (90 Base) MCG/ACT inhaler Inhale 2 puffs  into the lungs every 6 (six) hours as needed for wheezing or shortness of breath.    . Ascorbic Acid (VITAMIN C) 1000 MG tablet Take 1,000 mg by mouth every evening.    Marland Kitchen atorvastatin (LIPITOR) 20 MG tablet TAKE 1 TABLET BY MOUTH EVERY DAY 90 tablet 0  . busPIRone (BUSPAR) 10 MG tablet TAKE 1/2 TABLET BY MOUTH TWICE A DAY 90 tablet 1  . Cholecalciferol (VITAMIN D-3) 125 MCG (5000 UT) TABS Take 5,000 Units by mouth every evening.    . Cyanocobalamin (B-12) 2500 MCG TABS Take 2,500 mcg by mouth daily.    . fluticasone (FLONASE) 50 MCG/ACT nasal spray Place 1 spray into both nostrils daily as needed for allergies or rhinitis.    Marland Kitchen ibuprofen (ADVIL,MOTRIN) 200 MG tablet Take 400-600 mg by mouth daily as needed for headache or moderate pain.     Marland Kitchen LORazepam (ATIVAN) 0.5 MG tablet TAKE 1 TABLET BY MOUTH TWICE A DAY AS NEEDED FOR ANXIETY 60 tablet 1  . meloxicam (MOBIC) 7.5 MG tablet meloxicam 7.5 mg tablet  TAKE 1 TABLET BY MOUTH ONCE TO TWICE A DAY AS NEEDED    . Multiple Vitamin (MULTIVITAMIN WITH MINERALS) TABS tablet Take 1 tablet by mouth daily.     No current facility-administered medications for this visit.       Physical Exam: BP (!) 143/88 (BP Location: Right Arm, Patient Position: Sitting, Cuff Size: Normal)   Pulse 69   Temp Marland Kitchen)  97 F (36.1 C) (Skin)   Resp 18   Ht 5\' 5"  (1.651 m)   Wt 190 lb (86.2 kg)   LMP  (LMP Unknown)   SpO2 97% Comment: RA  BMI 31.62 kg/m  General appearance: alert, cooperative and no distress Neck: no adenopathy, no carotid bruit, no JVD, supple, symmetrical, trachea midline and thyroid not enlarged, symmetric, no tenderness/mass/nodules Lymph nodes: Cervical, supraclavicular, and axillary nodes normal. Resp: clear to auscultation bilaterally Cardio: regular rate and rhythm, S1, S2 normal, no murmur, click, rub or gallop GI: soft, non-tender; bowel sounds normal; no masses,  no organomegaly Extremities: extremities normal, atraumatic, no cyanosis or  edema Neurologic: Grossly normal    Diagnostic Studies & Laboratory data:     Recent Radiology Findings:   CT Chest Wo Contrast  Result Date: 05/02/2020 CLINICAL DATA:  History of non-small cell lung cancer status post right middle lobe lobectomy. Prior history of rectal cancer also. EXAM: CT CHEST WITHOUT CONTRAST TECHNIQUE: Multidetector CT imaging of the chest was performed following the standard protocol without IV contrast. COMPARISON:  10/19/2019 FINDINGS: Cardiovascular: The heart is normal in size. No pericardial effusion. Stable scattered aortic calcifications but no aneurysm. Mediastinum/Nodes: Stable 9 mm AP window node on image 66/2. No new or progressive findings. The esophagus is grossly normal. Lungs/Pleura: Stable emphysematous changes and right apical scarring. Stable surgical changes from a right middle lobe lobectomy. No findings suspicious for recurrent tumor. No pulmonary nodules to suggest pulmonary metastatic disease. Upper Abdomen: No significant upper abdominal findings. No hepatic or adrenal gland lesions are identified without contrast. No upper abdominal adenopathy. Stable aortic calcifications. Musculoskeletal: No breast masses, supraclavicular or axillary adenopathy. No significant bony findings. IMPRESSION: 1. Stable surgical changes from a right middle lobe lobectomy. No findings suspicious for recurrent tumor or metastatic disease. 2. Stable emphysematous changes and right apical scarring. 3. Stable 9 mm AP window node. 4. Emphysema and aortic atherosclerosis. Aortic Atherosclerosis (ICD10-I70.0) and Emphysema (ICD10-J43.9). Electronically Signed   By: Marijo Sanes M.D.   On: 05/02/2020 13:31     I have independently reviewed the above radiology studies  and reviewed the findings with the patient.    Recent Lab Findings: Lab Results  Component Value Date   WBC 6.2 04/30/2020   HGB 15.0 04/30/2020   HCT 44.8 04/30/2020   PLT 242 04/30/2020   GLUCOSE 90  04/30/2020   CHOL 175 04/30/2020   TRIG 95 04/30/2020   HDL 64 04/30/2020   LDLCALC 94 04/30/2020   ALT 22 04/30/2020   AST 20 04/30/2020   NA 141 04/30/2020   K 5.1 04/30/2020   CL 103 04/30/2020   CREATININE 0.86 04/30/2020   BUN 16 04/30/2020   CO2 27 04/30/2020   TSH 1.860 04/30/2020   INR 1.1 12/27/2018      Assessment / Plan:   #1 pulmonary carcinoid-resected right middle lobe 16 months ago-current follow-up CT scan shows no recurrence  #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  Continue surveillance discussed with the patient proceeding with the NM PET (NETSPOT GA 68 DOTATATE) SKULL BASE TO MID THIGH (Accession [id number]) in 6 months   Medication Changes: No orders of the defined types were placed in this encounter.     Grace Isaac MD      [address] [place] [place] [address] Office [phone]   Beeper [phone]  05/02/2020 2:48 PM

## 2020-05-03 ENCOUNTER — Telehealth: Payer: Self-pay | Admitting: Gastroenterology

## 2020-05-03 ENCOUNTER — Other Ambulatory Visit: Payer: Self-pay

## 2020-05-03 NOTE — Telephone Encounter (Signed)
Pls call pt, she wants to schedule repeat EUS.

## 2020-05-06 NOTE — Telephone Encounter (Signed)
The pt has been advised that she is on the recall for EUS and will be called as soon as it gets a little closer to her recommended repeat timing.

## 2020-06-15 ENCOUNTER — Other Ambulatory Visit: Payer: Self-pay | Admitting: Physician Assistant

## 2020-07-24 ENCOUNTER — Other Ambulatory Visit: Payer: Self-pay | Admitting: Physician Assistant

## 2020-08-13 ENCOUNTER — Telehealth: Payer: Self-pay | Admitting: Gastroenterology

## 2020-08-13 NOTE — Telephone Encounter (Signed)
Pt is requesting a call back from a nurse to scheduled her EUS.

## 2020-08-14 ENCOUNTER — Other Ambulatory Visit: Payer: Self-pay

## 2020-08-14 DIAGNOSIS — C7A026 Malignant carcinoid tumor of the rectum: Secondary | ICD-10-CM

## 2020-08-14 DIAGNOSIS — D3A026 Benign carcinoid tumor of the rectum: Secondary | ICD-10-CM

## 2020-08-14 NOTE — Telephone Encounter (Signed)
[place] EUS has been scheduled for 09/30/20 at 12 noon at [facility] with Dr Rush Landmark.  COVID test on 09/26/20 at 1020 am.    Left message on machine to call back

## 2020-08-15 NOTE — Telephone Encounter (Signed)
Left message on machine to call back  

## 2020-08-15 NOTE — Telephone Encounter (Signed)
EUS scheduled, pt instructed and medications reviewed.  Patient instructions mailed to home.  Patient to call with any questions or concerns.  

## 2020-08-15 NOTE — Telephone Encounter (Signed)
Patient returned your call, please call patient one more time.   

## 2020-09-18 ENCOUNTER — Ambulatory Visit: Payer: BC Managed Care – PPO | Admitting: Physician Assistant

## 2020-09-18 ENCOUNTER — Encounter: Payer: Self-pay | Admitting: Physician Assistant

## 2020-09-18 ENCOUNTER — Other Ambulatory Visit: Payer: Self-pay

## 2020-09-18 VITALS — BP 136/82 | HR 74 | Temp 96.4°F | Ht 65.0 in | Wt 187.8 lb

## 2020-09-18 DIAGNOSIS — S91209A Unspecified open wound of unspecified toe(s) with damage to nail, initial encounter: Secondary | ICD-10-CM | POA: Diagnosis not present

## 2020-09-18 NOTE — Progress Notes (Signed)
Acute Office Visit  Subjective:    Patient ID: Shannon [name], female    DOB: 11/07/1960, 60 y.o.   MRN: [id number]  Chief Complaint  Patient presents with  . Nail Problem    HPI Patient is in today for toenail is avulsion due to possible growth under left second toe -- pt unsure how long it has been like that and would like referral for removal More concerned because she has recent history of carcinoid tumor  Past Medical History:  Diagnosis Date  . Anxiety   . Arthritis   . Asthma   . Cancer ([place])    rectal, Lung  . Dyspnea    with exertion  . Emphysema of lung ([place])    patient denies  . History of kidney stones    passed  . HLD (hyperlipidemia)   . Pneumonia 02/2018  . Rectal tumor    cancer  . Seasonal allergies     Past Surgical History:  Procedure Laterality Date  . ABLATION     endometrial  . BIOPSY  10/10/2018   Procedure: BIOPSY;  Surgeon: Rush Landmark Telford Nab., MD;  Location: [place];  Service: Gastroenterology;;  . BIOPSY  06/12/2019   Procedure: BIOPSY;  Surgeon: Irving Copas., MD;  Location: [place];  Service: Gastroenterology;;  . COLONOSCOPY    . DOPPLER ECHOCARDIOGRAPHY    . ENDOSCOPIC MUCOSAL RESECTION N/A 10/10/2018   Procedure: ENDOSCOPIC MUCOSAL RESECTION;  Surgeon: Rush Landmark Telford Nab., MD;  Location: [place];  Service: Gastroenterology;  Laterality: N/A;  . EUS N/A 07/18/2018   Procedure: LOWER ENDOSCOPIC ULTRASOUND (EUS);  Surgeon: Irving Copas., MD;  Location: [place];  Service: Gastroenterology;  Laterality: N/A;  . EUS N/A 10/10/2018   Procedure: LOWER ENDOSCOPIC ULTRASOUND (EUS);  Surgeon: Irving Copas., MD;  Location: [place];  Service: Gastroenterology;  Laterality: N/A;  . EUS N/A 06/12/2019   Procedure: LOWER ENDOSCOPIC ULTRASOUND (EUS);  Surgeon: Irving Copas., MD;  Location: [place];  Service: Gastroenterology;  Laterality: N/A;  . FLEXIBLE SIGMOIDOSCOPY  N/A 07/18/2018   Procedure: FLEXIBLE SIGMOIDOSCOPY;  Surgeon: Irving Copas., MD;  Location: [place];  Service: Gastroenterology;  Laterality: N/A;  . FLEXIBLE SIGMOIDOSCOPY N/A 10/10/2018   Procedure: FLEXIBLE SIGMOIDOSCOPY;  Surgeon: Rush Landmark Telford Nab., MD;  Location: [place];  Service: Gastroenterology;  Laterality: N/A;  . FLEXIBLE SIGMOIDOSCOPY N/A 06/12/2019   Procedure: FLEXIBLE SIGMOIDOSCOPY;  Surgeon: Irving Copas., MD;  Location: [place];  Service: Gastroenterology;  Laterality: N/A;  . HEMOSTASIS CLIP PLACEMENT  10/10/2018   Procedure: HEMOSTASIS CLIP PLACEMENT;  Surgeon: Irving Copas., MD;  Location: [place];  Service: Gastroenterology;;  . LUNG CANCER SURGERY  12/30/2018   right lun- mid lobe , dr gerhardt  . SUBMUCOSAL LIFTING INJECTION  10/10/2018   Procedure: SUBMUCOSAL LIFTING INJECTION;  Surgeon: Irving Copas., MD;  Location: [place];  Service: Gastroenterology;;  . TEE WITHOUT CARDIOVERSION  02/2018  . TONSILLECTOMY    . TUBAL LIGATION  1987  . VIDEO ASSISTED THORACOSCOPY (VATS)/WEDGE RESECTION Right 12/30/2018   Procedure: VIDEO ASSISTED THORACOSCOPY (VATS), MINI THORACOTOMY, RIGHT MIDDLE LOBECTOMY WITH LYMPH NODE DISSECTION;  Surgeon: Grace Isaac, MD;  Location: [place];  Service: Thoracic;  Laterality: Right;  Marland Kitchen VIDEO BRONCHOSCOPY N/A 10/12/2018   Procedure: VIDEO BRONCHOSCOPY;  Surgeon: Grace Isaac, MD;  [facility];  Service: Thoracic;  Laterality: N/A;  . VIDEO BRONCHOSCOPY N/A 12/30/2018   Procedure: VIDEO BRONCHOSCOPY;  Surgeon: Grace Isaac,  MD;  Location: [facility] OR;  Service: Thoracic;  Laterality: N/A;  . WISDOM TOOTH EXTRACTION      Family History  Problem Relation Age of Onset  . Heart disease Mother   . COPD Mother   . Emphysema Mother   . Diabetes Father   . Colon cancer Neg Hx   . Colon polyps Neg Hx   . Esophageal cancer Neg Hx   . Rectal cancer Neg Hx   . Stomach  cancer Neg Hx     Social History   Socioeconomic History  . Marital status: Married    Spouse name: Not on file  . Number of children: Not on file  . Years of education: Not on file  . Highest education level: Not on file  Occupational History  . Not on file  Tobacco Use  . Smoking status: Former Smoker    Years: 33.00    Types: Cigarettes    Quit date: 2008    Years since quitting: 14.1  . Smokeless tobacco: Never Used  Vaping Use  . Vaping Use: Never used  Substance and Sexual Activity  . Alcohol use: Never    Comment: rare  . Drug use: Never  . Sexual activity: Not Currently    Birth control/protection: Post-menopausal  Other Topics Concern  . Not on file  Social History Narrative  . Not on file   Social Determinants of Health   Financial Resource Strain: Not on file  Food Insecurity: Not on file  Transportation Needs: Not on file  Physical Activity: Not on file  Stress: Not on file  Social Connections: Not on file  Intimate Partner Violence: Not on file    Outpatient Medications Prior to Visit  Medication Sig Dispense Refill  . albuterol (PROVENTIL HFA;VENTOLIN HFA) 108 (90 Base) MCG/ACT inhaler Inhale 2 puffs into the lungs every 6 (six) hours as needed for wheezing or shortness of breath.    . Ascorbic Acid (VITAMIN C) 1000 MG tablet Take 1,000 mg by mouth every evening.    Marland Kitchen atorvastatin (LIPITOR) 20 MG tablet TAKE 1 TABLET BY MOUTH EVERY DAY 90 tablet 0  . busPIRone (BUSPAR) 10 MG tablet TAKE 1/2 TABLET BY MOUTH TWICE A DAY 90 tablet 1  . Cholecalciferol (VITAMIN D-3) 125 MCG (5000 UT) TABS Take 5,000 Units by mouth every evening.    . Cyanocobalamin (B-12) 2500 MCG TABS Take 2,500 mcg by mouth daily.    . fluticasone (FLONASE) 50 MCG/ACT nasal spray Place 1 spray into both nostrils daily as needed for allergies or rhinitis.    Marland Kitchen ibuprofen (ADVIL,MOTRIN) 200 MG tablet Take 400-600 mg by mouth daily as needed for headache or moderate pain.     Marland Kitchen LORazepam  (ATIVAN) 0.5 MG tablet TAKE 1 TABLET BY MOUTH TWICE A DAY AS NEEDED FOR ANXIETY 60 tablet 1  . meloxicam (MOBIC) 7.5 MG tablet meloxicam 7.5 mg tablet  TAKE 1 TABLET BY MOUTH ONCE TO TWICE A DAY AS NEEDED    . Multiple Vitamin (MULTIVITAMIN WITH MINERALS) TABS tablet Take 1 tablet by mouth daily.     No facility-administered medications prior to visit.    Allergies  Allergen Reactions  . Hydrocodone Nausea Only    "terrible headache"  . Other     UNSPECIFIED REACTION  Dog Dander   . Levaquin [Levofloxacin In D5w] Nausea And Vomiting         Review of Systems CONSTITUTIONAL: Negative for chills, fatigue, fever, unintentional weight gain and unintentional weight  loss.  CARDIOVASCULAR: Negative for chest pain, dizziness, palpitations and pedal edema.  RESPIRATORY: Negative for recent cough and dyspnea.   INTEGUMENTARY: see HPI         Objective:    Physical Exam PHYSICAL EXAM:   VS: BP 136/82 (BP Location: Right Arm, Patient Position: Sitting, Cuff Size: Normal)   Pulse 74   Temp (!) 96.4 F (35.8 C) (Temporal)   Ht 5\' 5"  (1.651 m)   Wt 187 lb 12.8 oz (85.2 kg)   LMP  (LMP Unknown)   SpO2 98%   BMI 31.25 kg/m   GEN: Well nourished, well developed, in no acute distress  Cardiac: RRR; no murmurs, rubs, or gallops,no edema -  Respiratory:  normal respiratory rate and pattern with no distress - normal breath sounds with no rales, rhonchi, wheezes or rubs Skin: left second toe with dark area under nail and is avulsed  BP 136/82 (BP Location: Right Arm, Patient Position: Sitting, Cuff Size: Normal)   Pulse 74   Temp (!) 96.4 F (35.8 C) (Temporal)   Ht 5\' 5"  (1.651 m)   Wt 187 lb 12.8 oz (85.2 kg)   LMP  (LMP Unknown)   SpO2 98%   BMI 31.25 kg/m  Wt Readings from Last 3 Encounters:  09/18/20 187 lb 12.8 oz (85.2 kg)  05/02/20 190 lb (86.2 kg)  04/30/20 189 lb (85.7 kg)    Health Maintenance Due  Topic Date Due  . MAMMOGRAM  Never done  . PAP SMEAR-Modifier   Never done  . COVID-19 Vaccine (3 - Pfizer risk 4-dose series) 11/13/2019    There are no preventive care reminders to display for this patient.   Lab Results  Component Value Date   TSH 1.860 04/30/2020   Lab Results  Component Value Date   WBC 6.2 04/30/2020   HGB 15.0 04/30/2020   HCT 44.8 04/30/2020   MCV 86 04/30/2020   PLT 242 04/30/2020   Lab Results  Component Value Date   NA 141 04/30/2020   K 5.1 04/30/2020   CO2 27 04/30/2020   GLUCOSE 90 04/30/2020   BUN 16 04/30/2020   CREATININE 0.86 04/30/2020   BILITOT 0.6 04/30/2020   ALKPHOS 73 04/30/2020   AST 20 04/30/2020   ALT 22 04/30/2020   PROT 6.7 04/30/2020   ALBUMIN 4.7 04/30/2020   CALCIUM 10.4 (H) 04/30/2020   ANIONGAP 7 01/02/2019   GFR 74.11 07/04/2018   Lab Results  Component Value Date   CHOL 175 04/30/2020   Lab Results  Component Value Date   HDL 64 04/30/2020   Lab Results  Component Value Date   LDLCALC 94 04/30/2020   Lab Results  Component Value Date   TRIG 95 04/30/2020   Lab Results  Component Value Date   CHOLHDL 2.7 04/30/2020   No results found for: HGBA1C     Assessment & Plan:  1. Avulsion of toenail of left foot - Ambulatory referral to Dermatology    No orders of the defined types were placed in this encounter.   Orders Placed This Encounter  Procedures  . Ambulatory referral to Dermatology     Follow-up: No follow-ups on file.  An After Visit Summary was printed and given to the patient.  Yetta Flock [facility] [phone]

## 2020-09-24 ENCOUNTER — Other Ambulatory Visit: Payer: Self-pay

## 2020-09-24 NOTE — Progress Notes (Signed)
Attempted to obtain medical history via telephone, unable to reach at this time. I left a voicemail to return pre surgical testing department's phone call.  

## 2020-09-25 ENCOUNTER — Other Ambulatory Visit: Payer: Self-pay

## 2020-09-25 MED ORDER — MELOXICAM 7.5 MG PO TABS: 7.5000 mg | ORAL_TABLET | Freq: Two times a day (BID) | ORAL | 1 refills | Status: DC | PRN

## 2020-09-25 NOTE — Telephone Encounter (Signed)
Gay Filler please advise.  Pt is asking for a refill for her Meloxicam sent in to her pharmacy to [address] in [place].

## 2020-09-26 ENCOUNTER — Other Ambulatory Visit (HOSPITAL_COMMUNITY)
Admission: RE | Admit: 2020-09-26 | Discharge: 2020-09-26 | Disposition: A | Discharge status: Home / Self Care | Payer: BC Managed Care – PPO | Source: Ambulatory Visit | Attending: Gastroenterology | Admitting: Gastroenterology

## 2020-09-26 DIAGNOSIS — Z20822 Contact with and (suspected) exposure to covid-19: Secondary | ICD-10-CM | POA: Diagnosis not present

## 2020-09-26 DIAGNOSIS — Z01812 Encounter for preprocedural laboratory examination: Secondary | ICD-10-CM | POA: Insufficient documentation

## 2020-09-26 LAB — SARS CORONAVIRUS 2 (TAT 6-24 HRS): SARS Coronavirus 2: NEGATIVE

## 2020-09-30 ENCOUNTER — Ambulatory Visit (HOSPITAL_COMMUNITY): Payer: BC Managed Care – PPO | Admitting: Anesthesiology

## 2020-09-30 ENCOUNTER — Encounter (HOSPITAL_COMMUNITY)
Admission: RE | Disposition: A | Discharge status: Home / Self Care | Payer: Self-pay | Source: Home / Self Care | Attending: Gastroenterology

## 2020-09-30 ENCOUNTER — Encounter (HOSPITAL_COMMUNITY): Payer: Self-pay | Admitting: Gastroenterology

## 2020-09-30 ENCOUNTER — Other Ambulatory Visit: Payer: Self-pay

## 2020-09-30 ENCOUNTER — Ambulatory Visit (HOSPITAL_COMMUNITY)
Admission: RE | Admit: 2020-09-30 | Discharge: 2020-09-30 | Disposition: A | Discharge status: Home / Self Care | Payer: BC Managed Care – PPO | Attending: Gastroenterology | Admitting: Gastroenterology

## 2020-09-30 DIAGNOSIS — K644 Residual hemorrhoidal skin tags: Secondary | ICD-10-CM | POA: Diagnosis not present

## 2020-09-30 DIAGNOSIS — K5150 Left sided colitis without complications: Secondary | ICD-10-CM | POA: Diagnosis not present

## 2020-09-30 DIAGNOSIS — K641 Second degree hemorrhoids: Secondary | ICD-10-CM | POA: Diagnosis not present

## 2020-09-30 DIAGNOSIS — K6289 Other specified diseases of anus and rectum: Secondary | ICD-10-CM | POA: Diagnosis not present

## 2020-09-30 DIAGNOSIS — Z85040 Personal history of malignant carcinoid tumor of rectum: Secondary | ICD-10-CM | POA: Diagnosis present

## 2020-09-30 DIAGNOSIS — Z85110 Personal history of malignant carcinoid tumor of bronchus and lung: Secondary | ICD-10-CM | POA: Insufficient documentation

## 2020-09-30 DIAGNOSIS — Z881 Allergy status to other antibiotic agents status: Secondary | ICD-10-CM | POA: Diagnosis not present

## 2020-09-30 DIAGNOSIS — K5730 Diverticulosis of large intestine without perforation or abscess without bleeding: Secondary | ICD-10-CM

## 2020-09-30 DIAGNOSIS — Z885 Allergy status to narcotic agent status: Secondary | ICD-10-CM | POA: Insufficient documentation

## 2020-09-30 DIAGNOSIS — Z9889 Other specified postprocedural states: Secondary | ICD-10-CM | POA: Diagnosis not present

## 2020-09-30 DIAGNOSIS — D3A026 Benign carcinoid tumor of the rectum: Secondary | ICD-10-CM

## 2020-09-30 DIAGNOSIS — C7A026 Malignant carcinoid tumor of the rectum: Secondary | ICD-10-CM

## 2020-09-30 DIAGNOSIS — Z87891 Personal history of nicotine dependence: Secondary | ICD-10-CM | POA: Insufficient documentation

## 2020-09-30 HISTORY — PX: EUS: SHX5427

## 2020-09-30 HISTORY — PX: BIOPSY: SHX5522

## 2020-09-30 HISTORY — PX: FLEXIBLE SIGMOIDOSCOPY: SHX5431

## 2020-09-30 SURGERY — ULTRASOUND, LOWER GI TRACT, ENDOSCOPIC
Anesthesia: Monitor Anesthesia Care

## 2020-09-30 MED ORDER — PROPOFOL 500 MG/50ML IV EMUL
INTRAVENOUS | Status: AC
  Filled 2020-09-30: qty 50

## 2020-09-30 MED ORDER — LACTATED RINGERS IV SOLN
INTRAVENOUS | Status: DC
  Administered 2020-09-30: 1000 mL via INTRAVENOUS

## 2020-09-30 MED ORDER — PROPOFOL 10 MG/ML IV BOLUS
INTRAVENOUS | Status: DC | PRN
  Administered 2020-09-30: 10 mg via INTRAVENOUS
  Administered 2020-09-30: 20 mg via INTRAVENOUS
  Administered 2020-09-30 (×2): 10 mg via INTRAVENOUS

## 2020-09-30 MED ORDER — SODIUM CHLORIDE 0.9 % IV SOLN: INTRAVENOUS | Status: DC

## 2020-09-30 MED ORDER — LIDOCAINE HCL 1 % IJ SOLN
INTRAMUSCULAR | Status: DC | PRN
  Administered 2020-09-30: 20 mg via INTRADERMAL
  Administered 2020-09-30: 60 mg via INTRADERMAL

## 2020-09-30 MED ORDER — PROPOFOL 500 MG/50ML IV EMUL
INTRAVENOUS | Status: DC | PRN
  Administered 2020-09-30: 110 ug/kg/min via INTRAVENOUS

## 2020-09-30 NOTE — Op Note (Signed)
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 Vanetta Shawl [name], MD, [place] Medicines:                Monitored Anesthesia Care Complications:            No immediate complications. Estimated Blood Loss:     Estimated blood loss was minimal. Procedure:                Pre-Anesthesia Assessment:                           - Prior to the procedure, a History and Physical                            was performed, and patient medications and                            allergies were reviewed. The patient's tolerance of                            previous anesthesia was also reviewed. The risks                            and benefits of the procedure and the sedation                            options and risks were discussed with the patient.                            All questions were answered, and informed consent                            was obtained. Prior Anticoagulants: The patient has  taken no previous anticoagulant or antiplatelet                            agents except for NSAID medication. ASA Grade                            Assessment: II - A patient with mild systemic                            disease. After reviewing the risks and benefits,                            the patient was  deemed in satisfactory condition to                            undergo the procedure.                           After obtaining informed consent, the endoscope was                            passed under direct vision. Throughout the                            procedure, the patient's blood pressure, pulse, and                            oxygen saturations were monitored continuously. The                            [id number]-[id number] ([id number]) Olympus gastroscope was                            introduced through the anus and advanced to the the                            left transverse colon. The [id number] ([id number])                            Olympus was introduced through the anus and                            advanced to the the sigmoid colon for ultrasound.                            The lower EUS was accomplished without difficulty.                            The patient tolerated the procedure. Scope In: 1:27:56 PM Scope Out: 1:52:26 PM Total Procedure Duration: 0 hours 24 minutes 30 seconds  Findings:      ENDOSCOPIC FINDING: :      Segmental mild mucosal changes characterized by erythema were found in       the recto-sigmoid colon and in the sigmoid colon.  Biopsies were taken       with a cold forceps for histology to rule out chronic proctitis vs       preparation artifact.      Many small and large-mouthed diverticula were found in the recto-sigmoid       colon, sigmoid colon, descending colon and transverse colon.      A medium post mucosectomy scar was found from 11 to 13 cm proximal to       the anus.      Non-bleeding non-thrombosed external and internal hemorrhoids were found       during retroflexion, during perianal exam and during digital exam. The       hemorrhoids were Grade II (internal hemorrhoids that prolapse but reduce       spontaneously).      Skin tags were found on perianal exam.      The digital rectal exam findings include hemorrhoids. Pertinent       negatives  include no palpable rectal lesions.      ENDOSONOGRAPHIC FINDING: :      Mild wall thickening was found in the rectum. This was encountered from       11 to 12 cm (from the anal verge) at the scar site as noted above. This       finding appeared to be primarily due to increased thickness of the       luminal interface/superficial mucosa (Layer 1) and deep mucosa (Layer 2).      The rectum was otherwise normal.      The perirectal space was normal.      No malignant-appearing lymph nodes were visualized in the perirectal       region and in the left iliac region.      The internal anal sphincter was visualized endosonographically and       appeared normal. Impression:               Flexible Sigmiodoscopy Impression:                           - Perianal skin tags found on perianal exam.                           - Hemorrhoids found on digital rectal exam.                           - Segmental mild mucosal changes were found in the                            recto-sigmoid colon and in the sigmoid colon                            secondary to left-sided colitis. Biopsied to rule                            out chronic colitis - though likely preparation                            artifact since patient not having symptoms.                           -  Diverticulosis in the recto-sigmoid colon, in the                            sigmoid colon, in the descending colon and in the                            transverse colon.                           - Post mucosectomy scar from 11 to 13 cm proximal                            to the anus.                           - Non-bleeding non-thrombosed external and internal                            hemorrhoids.                           EUS Impression:                           - Wall thickening was visualized                            endosonographically in the rectum. This was due to                            increased thickness of the luminal                             interface/superficial mucosa (Layer 1) and deep                            mucosa (Layer 2). Consistent and in region of                            previous scar site from mucosectomy.                           - Endosonographic images of the rectum were                            otherwise unremarkable.                           - Endosonographic images of the perirectal space                            were unremarkable.                           - No malignant-appearing lymph nodes were  visualized endosonographically in the perirectal                            region and in the left iliac region.                           - The internal anal sphincter was visualized                            endosonographically and appeared normal. Moderate Sedation:      Not Applicable - Patient had care per Anesthesia. Recommendation:           - The patient will be observed post-procedure,                            until all discharge criteria are met.                           - Discharge patient to home.                           - Patient has a contact number available for                            emergencies. The signs and symptoms of potential                            delayed complications were discussed with the                            patient. Return to normal activities tomorrow.                            Written discharge instructions were provided to the                            patient.                           - Resume previous diet.                           - Await path results.                           - Repeat lower endoscopic ultrasound in 1-2 years                            for surveillance.                           - Will plan to obtain Chromogranin A level.                           - Will likely discuss case at [facility] in coming months  to consider follow up. Also need to see who will be                             following [facility]                            since Dr. Servando Snare has retired in regards to follow                            up. Had been considering a DOTATATE scan at some                            point this year (likely in Fall).                           - The findings and recommendations were discussed                            with the patient.                           - The findings and recommendations were discussed                            with the patient's family. Procedure Code(s):        --- Professional ---                           [phone], Sigmoidoscopy, flexible; with endoscopic                            ultrasound examination                           64158, Sigmoidoscopy, flexible; with biopsy, single                            or multiple Diagnosis Code(s):        --- Professional ---                           K51.50, Left sided colitis without complications                           K64.1, Second degree hemorrhoids                           Z98.890, Other specified postprocedural states                           K62.89, Other specified diseases of anus and rectum                           I89.9, Noninfective disorder of lymphatic vessels  and lymph nodes, unspecified                           K64.4, Residual hemorrhoidal skin tags                           K57.30, Diverticulosis of large intestine without                            perforation or abscess without bleeding CPT copyright 2019 American Medical Association. All rights reserved. The codes documented in this report are preliminary and upon coder review may  be revised to meet current compliance requirements. [name], MD 09/30/2020 2:08:16 PM Number of Addenda: 0

## 2020-09-30 NOTE — Anesthesia Preprocedure Evaluation (Addendum)
Anesthesia Evaluation  Patient identified by MRN, date of birth, ID band Patient awake    Reviewed: Allergy & Precautions, H&P , NPO status , Patient's Chart, lab work & pertinent test results  Airway Mallampati: II   Neck ROM: full    Dental   Pulmonary shortness of breath, asthma , pneumonia, resolved, COPD,  COPD inhaler, former smoker,  Hx/o pulmonary carcinoid   breath sounds clear to auscultation       Cardiovascular negative cardio ROS   Rhythm:regular Rate:Normal  EKG 12/27/18 SB, otherwise normal  Echo 03/23/18 Left ventricle: The cavity size was normal. Wall thickness was normal. Systolic function was normal. The estimated ejection fraction was in the range of 55% to 60%. Wall motion was normal; there were no regional wall motion abnormalities. Doppler parameters are consistent with abnormal left ventricular relaxation (grade 1 diastolic dysfunction).   Stress test 03/23/18  There was no ST segment deviation noted during stress.  Pt walked for 9:00 of a Bruce protocol GXT. Peak HR of 153 which is 93% predicted max HR  There were no ST or T wave changes to suggest ischemia  Blood pressure demonstrated a hypertensive response to exercise.  No arrhythmias  This is interpreted as a negagative stress test. no evidence of ischemia      Neuro/Psych PSYCHIATRIC DISORDERS Anxiety    GI/Hepatic Neg liver ROS, Rectal carcinoid   Endo/Other  Obesity  Renal/GU negative Renal ROS  negative genitourinary   Musculoskeletal  (+) Arthritis , Osteoarthritis,    Abdominal   Peds  Hematology negative hematology ROS (+)   Anesthesia Other Findings   Reproductive/Obstetrics                            Anesthesia Physical  Anesthesia Plan  ASA: III  Anesthesia Plan: MAC   Post-op Pain Management:    Induction: Intravenous  PONV Risk Score and Plan: 2 and Propofol infusion and  Treatment may vary due to age or medical condition  Airway Management Planned: Nasal Cannula  Additional Equipment:   Intra-op Plan:   Post-operative Plan:   Informed Consent: I have reviewed the patients History and Physical, chart, labs and discussed the procedure including the risks, benefits and alternatives for the proposed anesthesia with the patient or authorized representative who has indicated his/her understanding and acceptance.       Plan Discussed with: CRNA, Anesthesiologist and Surgeon  Anesthesia Plan Comments:         Anesthesia Quick Evaluation

## 2020-09-30 NOTE — Discharge Instructions (Signed)
YOU HAD AN ENDOSCOPIC PROCEDURE TODAY: Refer to the procedure report and other information in the discharge instructions given to you for any specific questions about what was found during the examination. If this information does not answer your questions, please call [place] office at [phone] to clarify.  ° °YOU SHOULD EXPECT: Some feelings of bloating in the abdomen. Passage of more gas than usual. Walking can help get rid of the air that was put into your GI tract during the procedure and reduce the bloating. If you had a lower endoscopy (such as a colonoscopy or flexible sigmoidoscopy) you may notice spotting of blood in your stool or on the toilet paper. Some abdominal soreness may be present for a day or two, also. ° °DIET: Your first meal following the procedure should be a light meal and then it is ok to progress to your normal diet. A half-sandwich or bowl of soup is an example of a good first meal. Heavy or fried foods are harder to digest and may make you feel nauseous or bloated. Drink plenty of fluids but you should avoid alcoholic beverages for 24 hours. If you had a esophageal dilation, please see attached instructions for diet.   ° °ACTIVITY: Your care partner should take you home directly after the procedure. You should plan to take it easy, moving slowly for the rest of the day. You can resume normal activity the day after the procedure however YOU SHOULD NOT DRIVE, use power tools, machinery or perform tasks that involve climbing or major physical exertion for 24 hours (because of the sedation medicines used during the test).  ° °SYMPTOMS TO REPORT IMMEDIATELY: °A gastroenterologist can be reached at any hour. Please call [phone]  for any of the following symptoms:  °Following lower endoscopy (colonoscopy, flexible sigmoidoscopy) °Excessive amounts of blood in the stool  °Significant tenderness, worsening of abdominal pains  °Swelling of the abdomen that is new, acute  °Fever of 100° or  higher  °Following upper endoscopy (EGD, EUS, ERCP, esophageal dilation) °Vomiting of blood or coffee ground material  °New, significant abdominal pain  °New, significant chest pain or pain under the shoulder blades  °Painful or persistently difficult swallowing  °New shortness of breath  °Black, tarry-looking or red, bloody stools ° °FOLLOW UP:  °If any biopsies were taken you will be contacted by phone or by letter within the next 1-3 weeks. Call [phone]  if you have not heard about the biopsies in 3 weeks.  °Please also call with any specific questions about appointments or follow up tests. ° °

## 2020-09-30 NOTE — Anesthesia Postprocedure Evaluation (Signed)
Anesthesia Post Note  Patient: Shannon [name]  Procedure(s) Performed: LOWER ENDOSCOPIC ULTRASOUND (EUS) (N/A ) BIOPSY     Patient location during evaluation: PACU Anesthesia Type: MAC Level of consciousness: awake and alert and oriented Pain management: pain level controlled Vital Signs Assessment: post-procedure vital signs reviewed and stable Respiratory status: spontaneous breathing, nonlabored ventilation and respiratory function stable Cardiovascular status: stable and blood pressure returned to baseline Postop Assessment: no apparent nausea or vomiting Anesthetic complications: no   No complications documented.  Last Vitals:  Vitals:   09/30/20 1053 09/30/20 1359  BP: (!) 152/61 (!) 159/60  Pulse: 60 60  Resp: 16 13  Temp: 37 C 36.6 C  SpO2: 99% 100%    Last Pain:  Vitals:   09/30/20 1410  TempSrc:   PainSc: 0-No pain                 Aidynn Krenn A.

## 2020-09-30 NOTE — H&P (Signed)
GASTROENTEROLOGY PROCEDURE H&P NOTE   Primary Care Physician: Marge Duncans, PA-C  HPI: Shannon [name] is a 60 y.o. 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  Past Medical History:  Diagnosis Date  . Anxiety   . Arthritis   . Asthma   . Cancer ([place])    rectal, Lung  . Dyspnea    with exertion  . Emphysema of lung ([place])    patient denies  . History of kidney stones    passed  . HLD (hyperlipidemia)   . Pneumonia 02/2018  . Rectal tumor    cancer  . Seasonal allergies    Past Surgical History:  Procedure Laterality Date  . ABLATION     endometrial  . BIOPSY  10/10/2018   Procedure: BIOPSY;  Surgeon: Rush Landmark Telford Nab., MD;  Location: [place];  Service: Gastroenterology;;  . BIOPSY  06/12/2019   Procedure: BIOPSY;  Surgeon: Irving Copas., MD;  Location: [place];  Service: Gastroenterology;;  . COLONOSCOPY    . DOPPLER ECHOCARDIOGRAPHY    . ENDOSCOPIC MUCOSAL RESECTION N/A 10/10/2018   Procedure: ENDOSCOPIC MUCOSAL RESECTION;  Surgeon: Rush Landmark Telford Nab., MD;  Location: [place];  Service: Gastroenterology;  Laterality: N/A;  . EUS N/A 07/18/2018   Procedure: LOWER ENDOSCOPIC ULTRASOUND (EUS);  Surgeon: Irving Copas., MD;  Location: [place];  Service: Gastroenterology;  Laterality: N/A;  . EUS N/A 10/10/2018   Procedure: LOWER ENDOSCOPIC ULTRASOUND (EUS);  Surgeon: Irving Copas., MD;  Location: [place];  Service: Gastroenterology;  Laterality: N/A;  . EUS N/A 06/12/2019   Procedure: LOWER ENDOSCOPIC ULTRASOUND (EUS);  Surgeon: Irving Copas., MD;  Location: [place];  Service: Gastroenterology;  Laterality: N/A;  . FLEXIBLE SIGMOIDOSCOPY N/A 07/18/2018   Procedure: FLEXIBLE SIGMOIDOSCOPY;  Surgeon: Irving Copas., MD;  Location: [place];  Service: Gastroenterology;  Laterality: N/A;  . FLEXIBLE SIGMOIDOSCOPY N/A  10/10/2018   Procedure: FLEXIBLE SIGMOIDOSCOPY;  Surgeon: Rush Landmark Telford Nab., MD;  Location: [place];  Service: Gastroenterology;  Laterality: N/A;  . FLEXIBLE SIGMOIDOSCOPY N/A 06/12/2019   Procedure: FLEXIBLE SIGMOIDOSCOPY;  Surgeon: Irving Copas., MD;  Location: [place];  Service: Gastroenterology;  Laterality: N/A;  . HEMOSTASIS CLIP PLACEMENT  10/10/2018   Procedure: HEMOSTASIS CLIP PLACEMENT;  Surgeon: Irving Copas., MD;  Location: [place];  Service: Gastroenterology;;  . LUNG CANCER SURGERY  12/30/2018   right lun- mid lobe , dr gerhardt  . SUBMUCOSAL LIFTING INJECTION  10/10/2018   Procedure: SUBMUCOSAL LIFTING INJECTION;  Surgeon: Irving Copas., MD;  Location: [place];  Service: Gastroenterology;;  . TEE WITHOUT CARDIOVERSION  02/2018  . TONSILLECTOMY    . TUBAL LIGATION  1987  . VIDEO ASSISTED THORACOSCOPY (VATS)/WEDGE RESECTION Right 12/30/2018   Procedure: VIDEO ASSISTED THORACOSCOPY (VATS), MINI THORACOTOMY, RIGHT MIDDLE LOBECTOMY WITH LYMPH NODE DISSECTION;  Surgeon: Grace Isaac, MD;  Location: [place];  Service: Thoracic;  Laterality: Right;  Marland Kitchen VIDEO BRONCHOSCOPY N/A 10/12/2018   Procedure: VIDEO BRONCHOSCOPY;  Surgeon: Grace Isaac, MD;  Location: [place];  Service: Thoracic;  Laterality: N/A;  . VIDEO BRONCHOSCOPY N/A 12/30/2018   Procedure: VIDEO BRONCHOSCOPY;  Surgeon: Grace Isaac, MD;  Location[facility];  Service: Thoracic;  Laterality: N/A;  . WISDOM TOOTH EXTRACTION     Current Facility-Administered Medications  Medication Dose Route Frequency Provider Last Rate Last Admin  . 0.9 %  sodium chloride infusion  Intravenous Continuous Mansouraty, Telford Nab., MD      . lactated ringers infusion   Intravenous Continuous Mansouraty, Telford Nab., MD 10 mL/hr at 09/30/20 1146 Continued from Pre-op at 09/30/20 1146   Allergies  Allergen Reactions  . Hydrocodone Nausea Only    "terrible headache"  . Other      UNSPECIFIED REACTION  Dog Dander   . Levaquin [Levofloxacin In D5w] Nausea And Vomiting        Family History  Problem Relation Age of Onset  . Heart disease Mother   . COPD Mother   . Emphysema Mother   . Diabetes Father   . Colon cancer Neg Hx   . Colon polyps Neg Hx   . Esophageal cancer Neg Hx   . Rectal cancer Neg Hx   . Stomach cancer Neg Hx    Social History   Socioeconomic History  . Marital status: Married    Spouse name: Not on file  . Number of children: Not on file  . Years of education: Not on file  . Highest education level: Not on file  Occupational History  . Not on file  Tobacco Use  . Smoking status: Former Smoker    Years: 33.00    Types: Cigarettes    Quit date: 2008    Years since quitting: 14.1  . Smokeless tobacco: Never Used  Vaping Use  . Vaping Use: Never used  Substance and Sexual Activity  . Alcohol use: Never    Comment: rare  . Drug use: Never  . Sexual activity: Not Currently    Birth control/protection: Post-menopausal  Other Topics Concern  . Not on file  Social History Narrative  . Not on file   Social Determinants of Health   Financial Resource Strain: Not on file  Food Insecurity: Not on file  Transportation Needs: Not on file  Physical Activity: Not on file  Stress: Not on file  Social Connections: Not on file  Intimate Partner Violence: Not on file    Physical Exam: Vital signs in last 24 hours: Temp:  [98.6 F (37 C)] 98.6 F (37 C) (03/07 1053) Pulse Rate:  [60] 60 (03/07 1053) Resp:  [16] 16 (03/07 1053) BP: (152)/(61) 152/61 (03/07 1053) SpO2:  [99 %] 99 % (03/07 1053)   GEN: NAD EYE: Sclerae anicteric ENT: MMM CV: Non-tachycardic GI: Soft, NT/ND NEURO:  Alert & Oriented x 3  Lab Results: No results for input(s): WBC, HGB, HCT, PLT in the last 72 hours. BMET No results for input(s): NA, K, CL, CO2, GLUCOSE, BUN, CREATININE, CALCIUM in the last 72 hours. LFT No results for input(s): PROT,  ALBUMIN, AST, ALT, ALKPHOS, BILITOT, BILIDIR, IBILI in the last 72 hours. PT/INR No results for input(s): LABPROT, INR in the last 72 hours.   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  The risks and benefits of endoscopic evaluation were discussed with the patient; these include but are not limited to the risk of perforation, infection, bleeding, missed lesions, lack of diagnosis, severe illness requiring hospitalization, as well as anesthesia and sedation related illnesses.  The patient is agreeable to proceed.    [name], MD [facility] Office # [phone]

## 2020-09-30 NOTE — Transfer of Care (Addendum)
Immediate Anesthesia Transfer of Care Note  Patient: Shannon [name]  Procedure(s) Performed: LOWER ENDOSCOPIC ULTRASOUND (EUS) (N/A ) BIOPSY  Patient Location: PACU and Endoscopy Unit  Anesthesia Type:MAC  Level of Consciousness: awake, alert , oriented and patient cooperative  Airway & Oxygen Therapy: Patient Spontanous Breathing and Patient connected to face mask oxygen  Post-op Assessment: Report given to RN and Post -op Vital signs reviewed and stable  Post vital signs: Reviewed and stable  Last Vitals:  Vitals Value Taken Time  BP    Temp    Pulse 61 09/30/20 1400  Resp 11 09/30/20 1400  SpO2 100 % 09/30/20 1400  Vitals shown include unvalidated device data.  Last Pain:  Vitals:   09/30/20 1359  TempSrc:   PainSc: 0-No pain         Complications: No complications documented.

## 2020-10-01 ENCOUNTER — Encounter (HOSPITAL_COMMUNITY): Payer: Self-pay | Admitting: Gastroenterology

## 2020-10-01 LAB — SURGICAL PATHOLOGY

## 2020-10-02 ENCOUNTER — Other Ambulatory Visit: Payer: Self-pay | Admitting: Cardiothoracic Surgery

## 2020-10-02 DIAGNOSIS — Z902 Acquired absence of lung [part of]: Secondary | ICD-10-CM

## 2020-10-08 ENCOUNTER — Encounter: Payer: Self-pay | Admitting: Gastroenterology

## 2020-10-10 ENCOUNTER — Other Ambulatory Visit: Payer: Self-pay | Admitting: *Deleted

## 2020-10-10 ENCOUNTER — Other Ambulatory Visit: Payer: Self-pay | Admitting: Physician Assistant

## 2020-10-10 DIAGNOSIS — Z902 Acquired absence of lung [part of]: Secondary | ICD-10-CM

## 2020-10-10 NOTE — Progress Notes (Signed)
Per Dr. Servando Snare, PET scan order replaced with PET Netspot Dotatate.

## 2020-10-14 ENCOUNTER — Telehealth: Payer: Self-pay

## 2020-10-14 NOTE — Telephone Encounter (Signed)
Patient stated that she has been on other statin medication but could not remember the name of them, She stated that one statin made her hands ache. Patient will calls [place] in 3 weeks and let us know if she is still having the leg cramps while not taking the medication.

## 2020-10-14 NOTE — Telephone Encounter (Signed)
WOULD SEE IF CRAMPS STOP AFTER BEING OFF MED FIRST HOW LONG HAS SHE BEEN ON CHOL MED?  EVER ON ANYTHING ELSE?

## 2020-10-14 NOTE — Telephone Encounter (Signed)
Patient stated that she has stop taking Lipitor 20 mg due to some legs cramps she is having, wanted to know should she make an appointment with you about this?

## 2020-10-16 ENCOUNTER — Other Ambulatory Visit: Payer: Self-pay | Admitting: Physician Assistant

## 2020-10-29 ENCOUNTER — Other Ambulatory Visit: Payer: Self-pay

## 2020-10-29 ENCOUNTER — Ambulatory Visit: Payer: BC Managed Care – PPO | Admitting: Physician Assistant

## 2020-10-29 ENCOUNTER — Encounter: Payer: Self-pay | Admitting: Physician Assistant

## 2020-10-29 VITALS — BP 120/80 | HR 67 | Temp 97.1°F | Resp 16 | Ht 65.5 in | Wt 188.8 lb

## 2020-10-29 DIAGNOSIS — D3A026 Benign carcinoid tumor of the rectum: Secondary | ICD-10-CM

## 2020-10-29 DIAGNOSIS — F419 Anxiety disorder, unspecified: Secondary | ICD-10-CM

## 2020-10-29 DIAGNOSIS — E782 Mixed hyperlipidemia: Secondary | ICD-10-CM

## 2020-10-29 DIAGNOSIS — M79605 Pain in left leg: Secondary | ICD-10-CM

## 2020-10-29 DIAGNOSIS — M79604 Pain in right leg: Secondary | ICD-10-CM

## 2020-10-29 DIAGNOSIS — E559 Vitamin D deficiency, unspecified: Secondary | ICD-10-CM | POA: Diagnosis not present

## 2020-10-29 NOTE — Progress Notes (Signed)
Established Patient Office Visit  Subjective:  Patient ID: Shannon [name], female    DOB: [birth year]  Age: 60 y.o. MRN: [id number]  CC:  Chief Complaint  Patient presents with  . Hyperlipidemia  . Anxiety    HPI MELISSSA [name] presents for follow up hyperlipidemia  Pt presents with hyperlipidemia.  Compliance with treatment has been good; she does follow up as directed and tries to  She denies experiencing any hypercholesterolemia related symptoms.  Pt actually stopped her lipitor because it was causing joint pains    Follow up of generalized anxiety disorder.  pt doing well on her current medications she takes buspar 10mg  1/2 bid and ativan as needed - says controlling symptoms well    Follow up of personal history of malignant carcinoid tumor of rectum.  pt was found to have carcinoid tumor of rectum on screening colonoscopy which was removed 07/2018 - she follows with Dr  Rush Landmark  She follows with Dr Pia Mau regularly  Pt is having some leg pains - thought due to her lipitor and since stopping med has gotten better but not completely gone - states worse after walking - has pain with and without activity - mostly in knee joints  Past Medical History:  Diagnosis Date  . Anxiety   . Arthritis   . Asthma   . Cancer ([place])    rectal, Lung  . Dyspnea    with exertion  . Emphysema of lung ([place])    patient denies  . History of kidney stones    passed  . HLD (hyperlipidemia)   . Pneumonia 02/2018  . Rectal tumor    cancer  . Seasonal allergies     Past Surgical History:  Procedure Laterality Date  . ABLATION     endometrial  . BIOPSY  10/10/2018   Procedure: BIOPSY;  Surgeon: Rush Landmark Telford Nab., MD;  Location: [place];  Service: Gastroenterology;;  . BIOPSY  06/12/2019   Procedure: BIOPSY;  Surgeon: Irving Copas., MD;  Location: [place];  Service: Gastroenterology;;  . BIOPSY  09/30/2020   Procedure: BIOPSY;  Surgeon: Irving Copas., MD;  [facility]: [facility];  Service: Gastroenterology;;  . COLONOSCOPY    . DOPPLER ECHOCARDIOGRAPHY    . ENDOSCOPIC MUCOSAL RESECTION N/A 10/10/2018   Procedure: ENDOSCOPIC MUCOSAL RESECTION;  Surgeon: Rush Landmark Telford Nab., MD;  Location: [employer];  Service: Gastroenterology;  Laterality: N/A;  . EUS N/A 07/18/2018   Procedure: LOWER ENDOSCOPIC ULTRASOUND (EUS);  Surgeon: Irving Copas., MD;  Location: [place];  Service: Gastroenterology;  Laterality: N/A;  . EUS N/A 10/10/2018   Procedure: LOWER ENDOSCOPIC ULTRASOUND (EUS);  Surgeon: Irving Copas., MD;  Location: [place];  Service: Gastroenterology;  Laterality: N/A;  . EUS N/A 06/12/2019   Procedure: LOWER ENDOSCOPIC ULTRASOUND (EUS);  Surgeon: Irving Copas., MD;  Location: [place];  Service: Gastroenterology;  Laterality: N/A;  . EUS N/A 09/30/2020   Procedure: LOWER ENDOSCOPIC ULTRASOUND (EUS);  Surgeon: Irving Copas., MD;  [facility];  Service: Gastroenterology;  Laterality: N/A;  . FLEXIBLE SIGMOIDOSCOPY N/A 07/18/2018   Procedure: FLEXIBLE SIGMOIDOSCOPY;  Surgeon: Irving Copas., MD;  Location: [place];  Service: Gastroenterology;  Laterality: N/A;  . FLEXIBLE SIGMOIDOSCOPY N/A 10/10/2018   Procedure: FLEXIBLE SIGMOIDOSCOPY;  Surgeon: Rush Landmark Telford Nab., MD;  Location: [place];  Service: Gastroenterology;  Laterality: N/A;  . FLEXIBLE SIGMOIDOSCOPY N/A 06/12/2019   Procedure: FLEXIBLE SIGMOIDOSCOPY;  Surgeon: Irving Copas., MD;  Location:  [facility];  Service: Gastroenterology;  Laterality: N/A;  . FLEXIBLE SIGMOIDOSCOPY N/A 09/30/2020   Procedure: FLEXIBLE SIGMOIDOSCOPY;  Surgeon: Rush Landmark Telford Nab., MD;  Location: [facility];  Service: Gastroenterology;  Laterality: N/A;  . HEMOSTASIS CLIP PLACEMENT  10/10/2018   Procedure: HEMOSTASIS CLIP PLACEMENT;  Surgeon: Irving Copas., MD;  Location: [place];   Service: Gastroenterology;;  . LUNG CANCER SURGERY  12/30/2018   right lun- mid lobe , dr gerhardt  . SUBMUCOSAL LIFTING INJECTION  10/10/2018   Procedure: SUBMUCOSAL LIFTING INJECTION;  Surgeon: Irving Copas., MD;  Location: [place];  Service: Gastroenterology;;  . TEE WITHOUT CARDIOVERSION  02/2018  . TONSILLECTOMY    . TUBAL LIGATION  1987  . VIDEO ASSISTED THORACOSCOPY (VATS)/WEDGE RESECTION Right 12/30/2018   Procedure: VIDEO ASSISTED THORACOSCOPY (VATS), MINI THORACOTOMY, RIGHT MIDDLE LOBECTOMY WITH LYMPH NODE DISSECTION;  Surgeon: Grace Isaac, MD;  Location: [place];  Service: Thoracic;  Laterality: Right;  Marland Kitchen VIDEO BRONCHOSCOPY N/A 10/12/2018   Procedure: VIDEO BRONCHOSCOPY;  Surgeon: Grace Isaac, MD;  Location: [place];  Service: Thoracic;  Laterality: N/A;  . VIDEO BRONCHOSCOPY N/A 12/30/2018   Procedure: VIDEO BRONCHOSCOPY;  Surgeon: Grace Isaac, MD;  [facility];  Service: Thoracic;  Laterality: N/A;  . WISDOM TOOTH EXTRACTION      Family History  Problem Relation Age of Onset  . Heart disease Mother   . COPD Mother   . Emphysema Mother   . Diabetes Father   . Colon cancer Neg Hx   . Colon polyps Neg Hx   . Esophageal cancer Neg Hx   . Rectal cancer Neg Hx   . Stomach cancer Neg Hx     Social History   Socioeconomic History  . Marital status: Married    Spouse name: Not on file  . Number of children: Not on file  . Years of education: Not on file  . Highest education level: Not on file  Occupational History  . Not on file  Tobacco Use  . Smoking status: Former Smoker    Years: 33.00    Types: Cigarettes    Quit date: 2008    Years since quitting: 14.2  . Smokeless tobacco: Never Used  Vaping Use  . Vaping Use: Never used  Substance and Sexual Activity  . Alcohol use: Never    Comment: rare  . Drug use: Never  . Sexual activity: Not Currently    Birth control/protection: Post-menopausal  Other Topics Concern  . Not on  file  Social History Narrative  . Not on file   Social Determinants of Health   Financial Resource Strain: Not on file  Food Insecurity: Not on file  Transportation Needs: Not on file  Physical Activity: Not on file  Stress: Not on file  Social Connections: Not on file  Intimate Partner Violence: Not on file     Current Outpatient Medications:  .  acetaminophen (TYLENOL) 325 MG tablet, Take 650 mg by mouth every 6 (six) hours as needed for moderate pain., Disp: , Rfl:  .  albuterol (PROVENTIL HFA;VENTOLIN HFA) 108 (90 Base) MCG/ACT inhaler, Inhale 2 puffs into the lungs every 6 (six) hours as needed for wheezing or shortness of breath., Disp: , Rfl:  .  busPIRone (BUSPAR) 10 MG tablet, Take 0.5 tablets (5 mg total) by mouth 2 (two) times daily., Disp: 60 tablet, Rfl: 2 .  calcium carbonate (TUMS - DOSED IN MG ELEMENTAL CALCIUM) 500 MG chewable tablet, Chew 1  tablet by mouth daily as needed for indigestion or heartburn., Disp: , Rfl:  .  Cholecalciferol (VITAMIN D-3) 125 MCG (5000 UT) TABS, Take 5,000 Units by mouth every evening., Disp: , Rfl:  .  Cyanocobalamin (B-12) 2500 MCG TABS, Take 2,500 mcg by mouth daily., Disp: , Rfl:  .  fluticasone (FLONASE) 50 MCG/ACT nasal spray, Place 1 spray into both nostrils daily as needed for allergies or rhinitis., Disp: , Rfl:  .  LORazepam (ATIVAN) 0.5 MG tablet, TAKE 1 TABLET BY MOUTH TWICE A DAY AS NEEDED FOR ANXIETY, Disp: 60 tablet, Rfl: 1 .  meloxicam (MOBIC) 7.5 MG tablet, Take 1 tablet (7.5 mg total) by mouth 2 (two) times daily as needed for pain., Disp: 60 tablet, Rfl: 1 .  Multiple Vitamin (MULTIVITAMIN WITH MINERALS) TABS tablet, Take 1 tablet by mouth daily., Disp: , Rfl:  .  vitamin C (ASCORBIC ACID) 500 MG tablet, Take 500 mg by mouth daily., Disp: , Rfl:    Allergies  Allergen Reactions  . Hydrocodone Nausea Only    "terrible headache"  . Other     UNSPECIFIED REACTION  Dog Dander   . Levaquin [Levofloxacin In D5w] Nausea And  Vomiting         ROS CONSTITUTIONAL: Negative for chills, fatigue, fever, unintentional weight gain and unintentional weight loss.  E/N/T: Negative for ear pain, nasal congestion and sore throat.  CARDIOVASCULAR: Negative for chest pain, dizziness, palpitations and pedal edema.  RESPIRATORY: Negative for recent cough and dyspnea.  GASTROINTESTINAL: Negative for abdominal pain, acid reflux symptoms, constipation, diarrhea, nausea and vomiting.  GU - no urine symptoms - possible history of vaginal bleeding MSK: see HPI INTEGUMENTARY: Negative for rash.  NEUROLOGICAL: Negative for dizziness and headaches.  PSYCHIATRIC: Negative for sleep disturbance and to question depression screen.  Negative for depression, negative for anhedonia.        Objective:    PHYSICAL EXAM:   VS: BP 120/80 (BP Location: Left Arm, Patient Position: Sitting, Cuff Size: Normal)   Pulse 67   Temp (!) 97.1 F (36.2 C) (Temporal)   Resp 16   Ht 5' 5.5" (1.664 m)   Wt 188 lb 12.8 oz (85.6 kg)   LMP  (LMP Unknown)   SpO2 97%   BMI 30.94 kg/m   PHYSICAL EXAM:   VS: BP 120/80 (BP Location: Left Arm, Patient Position: Sitting, Cuff Size: Normal)   Pulse 67   Temp (!) 97.1 F (36.2 C) (Temporal)   Resp 16   Ht 5' 5.5" (1.664 m)   Wt 188 lb 12.8 oz (85.6 kg)   LMP  (LMP Unknown)   SpO2 97%   BMI 30.94 kg/m   GEN: Well nourished, well developed, in no acute distress  Cardiac: RRR; no murmurs, rubs, or gallops,no edema -  Respiratory:  normal respiratory rate and pattern with no distress - normal breath sounds with no rales, rhonchi, wheezes or rubs MS: no deformity or atrophy  Skin: warm and dry, no rash  Neuro:  Alert and Oriented x 3, Strength and sensation are intact - CN II-Xii grossly intact Psych: euthymic mood, appropriate affect and demeanor    No visits with results within 1 Day(s) from this visit.  Latest known visit with results is:  Admission on 09/30/2020, Discharged on 09/30/2020   Component Date Value Ref Range Status  . SURGICAL PATHOLOGY 09/30/2020    Final-Edited  Value:SURGICAL PATHOLOGY CASE: [id number] PATIENT: Estle Kawahara Surgical Pathology Report     Clinical History: Rectal carcinoid; diverticulosis, hemorrhoids (crm)     FINAL MICROSCOPIC DIAGNOSIS:  A. COLON, LEFT, BIOPSY: - Colonic mucosa with mild hyperemia. - No microscopic colitis, active inflammation or chronic changes.   GROSS DESCRIPTION:  Received in formalin are tan, soft tissue fragments that are submitted in toto. Number: Multiple size: Range from 0.1 to 0.7 cm blocks: 1 Craig Staggers 09/30/2020)    Final Diagnosis performed by Claudette Laws, MD.   Electronically signed 10/01/2020 Technical and / or Professional components performed at [facility], [place] [address], [place], [place] [address].  Immunohistochemistry Technical component (if applicable) was performed at [facility]. [address], [place], [place], [place] [address].   IMMUNOHISTOCHEMISTRY DISCLAIMER (if applicable): Some of these immunohistochemical stain                         s may have been developed and the performance characteristics determine by [facility]. Some may not have been cleared or approved by the U.S. Food and Drug Administration. The FDA has determined that such clearance or approval is not necessary. This test is used for clinical purposes. It should not be regarded as investigational or for research. This laboratory is certified under the [place] (CLIA-88) as qualified to perform high complexity clinical laboratory testing.  The controls stained appropriately.     BP 120/80 (BP Location: Left Arm, Patient Position: Sitting, Cuff Size: Normal)   Pulse 67   Temp (!) 97.1 F (36.2 C) (Temporal)   Resp 16   Ht 5' 5.5" (1.664 m)   Wt 188 lb 12.8 oz (85.6 kg)   LMP  (LMP Unknown)    SpO2 97%   BMI 30.94 kg/m  Wt Readings from Last 3 Encounters:  10/29/20 188 lb 12.8 oz (85.6 kg)  09/24/20 189 lb (85.7 kg)  09/18/20 187 lb 12.8 oz (85.2 kg)     Health Maintenance Due  Topic Date Due  . MAMMOGRAM  Never done  . PAP SMEAR-Modifier  Never done    There are no preventive care reminders to display for this patient.  Lab Results  Component Value Date   TSH 1.860 04/30/2020   Lab Results  Component Value Date   WBC 6.2 04/30/2020   HGB 15.0 04/30/2020   HCT 44.8 04/30/2020   MCV 86 04/30/2020   PLT 242 04/30/2020   Lab Results  Component Value Date   NA 141 04/30/2020   K 5.1 04/30/2020   CO2 27 04/30/2020   GLUCOSE 90 04/30/2020   BUN 16 04/30/2020   CREATININE 0.86 04/30/2020   BILITOT 0.6 04/30/2020   ALKPHOS 73 04/30/2020   AST 20 04/30/2020   ALT 22 04/30/2020   PROT 6.7 04/30/2020   ALBUMIN 4.7 04/30/2020   CALCIUM 10.4 (H) 04/30/2020   ANIONGAP 7 01/02/2019   GFR 74.11 07/04/2018   Lab Results  Component Value Date   CHOL 175 04/30/2020   Lab Results  Component Value Date   HDL 64 04/30/2020   Lab Results  Component Value Date   LDLCALC 94 04/30/2020   Lab Results  Component Value Date   TRIG 95 04/30/2020   Lab Results  Component Value Date   CHOLHDL 2.7 04/30/2020   No results found for: HGBA1C    Assessment & Plan:   Problem List Items Addressed This  Visit      Digestive   Carcinoid tumor of rectum Continue follow up with specialists     Other   Mixed hyperlipidemia - Primary   Relevant Orders   CBC with Differential/Platelet   Comprehensive metabolic panel   Lipid panel Continue meds   Vitamin D insufficiency   Relevant Orders   VITAMIN D 25 Hydroxy (Vit-D Deficiency, Fractures)   Anxiety   Relevant Orders   TSH Continue meds   Pain in both lower extremities Increase mobic to bid - follow up if symptoms persist      No orders of the defined types were placed in this encounter.   Follow-up:  Return in about 6 months (around 04/30/2021) for chronic fasting follow up.  Pt will call to schedule GYN appt for further evaluation   SARA R Maximilliano Kersh, PA-C

## 2020-10-30 ENCOUNTER — Telehealth: Payer: Self-pay

## 2020-10-30 LAB — COMPREHENSIVE METABOLIC PANEL
ALT: 23 IU/L (ref 0–32)
AST: 26 IU/L (ref 0–40)
Albumin/Globulin Ratio: 2 (ref 1.2–2.2)
Albumin: 4.7 g/dL (ref 3.8–4.9)
Alkaline Phosphatase: 82 IU/L (ref 44–121)
BUN/Creatinine Ratio: 25 — ABNORMAL HIGH (ref 9–23)
BUN: 20 mg/dL (ref 6–24)
Bilirubin Total: 0.6 mg/dL (ref 0.0–1.2)
CO2: 27 mmol/L (ref 20–29)
Calcium: 10.1 mg/dL (ref 8.7–10.2)
Chloride: 103 mmol/L (ref 96–106)
Creatinine, Ser: 0.8 mg/dL (ref 0.57–1.00)
Globulin, Total: 2.3 g/dL (ref 1.5–4.5)
Glucose: 91 mg/dL (ref 65–99)
Potassium: 5 mmol/L (ref 3.5–5.2)
Sodium: 142 mmol/L (ref 134–144)
Total Protein: 7 g/dL (ref 6.0–8.5)
eGFR: 85 mL/min/{1.73_m2} (ref >59)

## 2020-10-30 LAB — CBC WITH DIFFERENTIAL/PLATELET
Basophils Absolute: 0 10*3/uL (ref 0.0–0.2)
Basos: 1 %
EOS (ABSOLUTE): 0.4 10*3/uL (ref 0.0–0.4)
Eos: 6 %
Hematocrit: 45.4 % (ref 34.0–46.6)
Hemoglobin: 15.1 g/dL (ref 11.1–15.9)
Immature Grans (Abs): 0 10*3/uL (ref 0.0–0.1)
Immature Granulocytes: 0 %
Lymphocytes Absolute: 2.2 10*3/uL (ref 0.7–3.1)
Lymphs: 38 %
MCH: 28.4 pg (ref 26.6–33.0)
MCHC: 33.3 g/dL (ref 31.5–35.7)
MCV: 86 fL (ref 79–97)
Monocytes Absolute: 0.5 10*3/uL (ref 0.1–0.9)
Monocytes: 8 %
Neutrophils Absolute: 2.7 10*3/uL (ref 1.4–7.0)
Neutrophils: 47 %
Platelets: 234 10*3/uL (ref 150–450)
RBC: 5.31 x10E6/uL — ABNORMAL HIGH (ref 3.77–5.28)
RDW: 13 % (ref 11.7–15.4)
WBC: 5.8 10*3/uL (ref 3.4–10.8)

## 2020-10-30 LAB — LIPID PANEL
Chol/HDL Ratio: 4.3 ratio (ref 0.0–4.4)
Cholesterol, Total: 286 mg/dL — ABNORMAL HIGH (ref 100–199)
HDL: 67 mg/dL (ref >39)
LDL Chol Calc (NIH): 195 mg/dL — ABNORMAL HIGH (ref 0–99)
Triglycerides: 136 mg/dL (ref 0–149)
VLDL Cholesterol Cal: 24 mg/dL (ref 5–40)

## 2020-10-30 LAB — TSH: TSH: 1.93 u[IU]/mL (ref 0.450–4.500)

## 2020-10-30 LAB — CARDIOVASCULAR RISK ASSESSMENT

## 2020-10-30 LAB — VITAMIN D 25 HYDROXY (VIT D DEFICIENCY, FRACTURES): Vit D, 25-Hydroxy: 40.6 ng/mL (ref 30.0–100.0)

## 2020-10-30 NOTE — Telephone Encounter (Signed)
Pt called back stating insurance would cover injectable medication w/ PA.   # for PA: [id number]   [name] Lark 10/30/20 4:08 PM

## 2020-10-31 ENCOUNTER — Other Ambulatory Visit: Payer: Self-pay | Admitting: Physician Assistant

## 2020-10-31 MED ORDER — REPATHA SURECLICK 140 MG/ML ~~LOC~~ SOAJ: SUBCUTANEOUS | 2 refills | Status: DC

## 2020-10-31 NOTE — Telephone Encounter (Signed)
Rx sent to pharmacy and will go from there

## 2020-10-31 NOTE — Telephone Encounter (Signed)
Attempted to call pt to update on status of situation. No answer, left detailed message on identifying VM box.   Shannon [name], [place] 10/31/20 9:40 AM

## 2020-11-08 ENCOUNTER — Ambulatory Visit: Payer: BC Managed Care – PPO | Admitting: Thoracic Surgery (Cardiothoracic Vascular Surgery)

## 2020-11-17 ENCOUNTER — Other Ambulatory Visit: Payer: Self-pay | Admitting: Physician Assistant

## 2020-11-21 ENCOUNTER — Other Ambulatory Visit: Payer: Self-pay | Admitting: Physician Assistant

## 2020-11-21 ENCOUNTER — Other Ambulatory Visit: Payer: Self-pay

## 2020-11-21 ENCOUNTER — Ambulatory Visit (HOSPITAL_COMMUNITY)
Admission: RE | Admit: 2020-11-21 | Discharge: 2020-11-21 | Disposition: A | Discharge status: Home / Self Care | Payer: BC Managed Care – PPO | Source: Ambulatory Visit | Attending: Thoracic Surgery (Cardiothoracic Vascular Surgery) | Admitting: Thoracic Surgery (Cardiothoracic Vascular Surgery)

## 2020-11-21 DIAGNOSIS — Z902 Acquired absence of lung [part of]: Secondary | ICD-10-CM | POA: Insufficient documentation

## 2020-11-21 MED ORDER — GALLIUM GA 68 DOTATATE IV KIT
4.7000 | PACK | Freq: Once | INTRAVENOUS | Status: AC | PRN
  Administered 2020-11-21: 4.7 via INTRAVENOUS

## 2020-11-22 ENCOUNTER — Ambulatory Visit: Payer: BC Managed Care – PPO | Admitting: Thoracic Surgery (Cardiothoracic Vascular Surgery)

## 2020-11-22 ENCOUNTER — Telehealth: Payer: Self-pay | Admitting: Gastroenterology

## 2020-11-22 VITALS — BP 140/80 | HR 73 | Resp 20 | Ht 65.0 in | Wt 185.0 lb

## 2020-11-22 DIAGNOSIS — Z902 Acquired absence of lung [part of]: Secondary | ICD-10-CM

## 2020-11-22 DIAGNOSIS — C7A090 Malignant carcinoid tumor of the bronchus and lung: Secondary | ICD-10-CM

## 2020-11-22 DIAGNOSIS — C7A026 Malignant carcinoid tumor of the rectum: Secondary | ICD-10-CM

## 2020-11-22 NOTE — Progress Notes (Signed)
[address]       [place] [address]             [phone]                    Shannon [name] [place] Medical Record #[id number] Date of Birth: 1961-04-02  Referring: Shannon [name].* Primary Care: Marge Duncans, PA-C Primary Cardiologist: No primary care provider on file.  Chief Complaint:    Chief Complaint  Patient presents with  . carcinoid bronchial ademoma    F/u after PET Scan 11/21/20    History of Present Illness:    Shannon [name] 60 y.o. female with a history of right middle lobe carcinoid tumor that was resected in June 2020.  She presents in her 1 year follow-up with a PET/CT.  She has no complaints today.    Past Medical History:  Diagnosis Date  . Anxiety   . Arthritis   . Asthma   . Cancer ([place])    rectal, Lung  . Dyspnea    with exertion  . Emphysema of lung ([place])    patient denies  . History of kidney stones    passed  . HLD (hyperlipidemia)   . Pneumonia 02/2018  . Rectal tumor    cancer  . Seasonal allergies     Past Surgical History:  Procedure Laterality Date  . ABLATION     endometrial  . BIOPSY  10/10/2018   Procedure: BIOPSY;  Surgeon: Rush Landmark Telford Nab., MD;  Location: [place];  Service: Gastroenterology;;  . BIOPSY  06/12/2019   Procedure: BIOPSY;  Surgeon: Shannon [name]., MD;  Location: [place];  Service: Gastroenterology;;  . BIOPSY  09/30/2020   Procedure: BIOPSY;  Surgeon: Shannon [name]., MD;  [facility];  Service: Gastroenterology;;  . COLONOSCOPY    . DOPPLER ECHOCARDIOGRAPHY    . ENDOSCOPIC MUCOSAL RESECTION N/A 10/10/2018   Procedure: ENDOSCOPIC MUCOSAL RESECTION;  Surgeon: Rush Landmark Telford Nab., MD;  Location: [place];  Service: Gastroenterology;  Laterality: N/A;  . EUS N/A 07/18/2018   Procedure: LOWER ENDOSCOPIC ULTRASOUND (EUS);  Surgeon: Shannon [name]., MD;  Location: [place];  Service: Gastroenterology;  Laterality:  N/A;  . EUS N/A 10/10/2018   Procedure: LOWER ENDOSCOPIC ULTRASOUND (EUS);  Surgeon: Shannon [name]., MD;  Location: [place];  Service: Gastroenterology;  Laterality: N/A;  . EUS N/A 06/12/2019   Procedure: LOWER ENDOSCOPIC ULTRASOUND (EUS);  Surgeon: Shannon [name]., MD;  Location: [place];  Service: Gastroenterology;  Laterality: N/A;  . EUS N/A 09/30/2020   Procedure: LOWER ENDOSCOPIC ULTRASOUND (EUS);  Surgeon: Shannon [name]., MD;  [facility]: [facility];  Service: Gastroenterology;  Laterality: N/A;  . FLEXIBLE SIGMOIDOSCOPY N/A 07/18/2018   Procedure: FLEXIBLE SIGMOIDOSCOPY;  Surgeon: Shannon [name]., MD;  Location: [place];  Service: Gastroenterology;  Laterality: N/A;  . FLEXIBLE SIGMOIDOSCOPY N/A 10/10/2018   Procedure: FLEXIBLE SIGMOIDOSCOPY;  Surgeon: Rush Landmark Telford Nab., MD;  Location: [place];  Service: Gastroenterology;  Laterality: N/A;  . FLEXIBLE SIGMOIDOSCOPY N/A 06/12/2019   Procedure: FLEXIBLE SIGMOIDOSCOPY;  Surgeon: Shannon [name]., MD;  Location: [place];  Service: Gastroenterology;  Laterality: N/A;  . FLEXIBLE SIGMOIDOSCOPY N/A 09/30/2020   Procedure: FLEXIBLE SIGMOIDOSCOPY;  Surgeon: Rush Landmark Telford Nab., MD;  Location: [facility];  Service: Gastroenterology;  Laterality: N/A;  . HEMOSTASIS CLIP PLACEMENT  10/10/2018   Procedure: HEMOSTASIS CLIP PLACEMENT;  Surgeon: Rush Landmark Telford Nab., MD;  Location: [facility];  Service: Gastroenterology;;  . LUNG CANCER SURGERY  12/30/2018   right lun- mid lobe , dr gerhardt  . SUBMUCOSAL LIFTING INJECTION  10/10/2018   Procedure: SUBMUCOSAL LIFTING INJECTION;  Surgeon: Shannon [name]., MD;  Location: [place];  Service: Gastroenterology;;  . TEE WITHOUT CARDIOVERSION  02/2018  . TONSILLECTOMY    . TUBAL LIGATION  1987  . VIDEO ASSISTED THORACOSCOPY (VATS)/WEDGE RESECTION Right 12/30/2018   Procedure: VIDEO ASSISTED THORACOSCOPY (VATS), MINI  THORACOTOMY, RIGHT MIDDLE LOBECTOMY WITH LYMPH NODE DISSECTION;  Surgeon: Grace Isaac, MD;  Location: [place];  Service: Thoracic;  Laterality: Right;  Marland Kitchen VIDEO BRONCHOSCOPY N/A 10/12/2018   Procedure: VIDEO BRONCHOSCOPY;  Surgeon: Grace Isaac, MD;  Location: [place];  Service: Thoracic;  Laterality: N/A;  . VIDEO BRONCHOSCOPY N/A 12/30/2018   Procedure: VIDEO BRONCHOSCOPY;  Surgeon: Grace Isaac, MD;  Location: [facility];  Service: Thoracic;  Laterality: N/A;  . WISDOM TOOTH EXTRACTION      Family History  Problem Relation Age of Onset  . Heart disease Mother   . COPD Mother   . Emphysema Mother   . Diabetes Father   . Colon cancer Neg Hx   . Colon polyps Neg Hx   . Esophageal cancer Neg Hx   . Rectal cancer Neg Hx   . Stomach cancer Neg Hx      Social History   Tobacco Use  Smoking Status Former Smoker  . Years: 33.00  . Types: Cigarettes  . Quit date: 2008  . Years since quitting: 14.3  Smokeless Tobacco Never Used    Social History   Substance and Sexual Activity  Alcohol Use Never   Comment: rare     Allergies  Allergen Reactions  . Hydrocodone Nausea Only    "terrible headache"  . Other     UNSPECIFIED REACTION  Dog Dander   . Levaquin [Levofloxacin In D5w] Nausea And Vomiting         Current Outpatient Medications  Medication Sig Dispense Refill  . acetaminophen (TYLENOL) 325 MG tablet Take 650 mg by mouth every 6 (six) hours as needed for moderate pain.    Marland Kitchen albuterol (PROVENTIL HFA;VENTOLIN HFA) 108 (90 Base) MCG/ACT inhaler Inhale 2 puffs into the lungs every 6 (six) hours as needed for wheezing or shortness of breath.    . busPIRone (BUSPAR) 10 MG tablet Take 0.5 tablets (5 mg total) by mouth 2 (two) times daily. 60 tablet 2  . calcium carbonate (TUMS - DOSED IN MG ELEMENTAL CALCIUM) 500 MG chewable tablet Chew 1 tablet by mouth daily as needed for indigestion or heartburn.    . Cholecalciferol (VITAMIN D-3) 125 MCG (5000 UT) TABS Take  5,000 Units by mouth every evening.    . Cyanocobalamin (B-12) 2500 MCG TABS Take 2,500 mcg by mouth daily.    . fluticasone (FLONASE) 50 MCG/ACT nasal spray Place 1 spray into both nostrils daily as needed for allergies or rhinitis.    Marland Kitchen LORazepam (ATIVAN) 0.5 MG tablet TAKE 1 TABLET BY MOUTH TWICE A DAY AS NEEDED FOR ANXIETY 60 tablet 1  . meloxicam (MOBIC) 7.5 MG tablet TAKE 1 TABLET BY MOUTH 2 TIMES DAILY AS NEEDED FOR PAIN. 60 tablet 1  . Multiple Vitamin (MULTIVITAMIN WITH MINERALS) TABS tablet Take 1 tablet by mouth daily.    Marland Kitchen PRALUENT 75 MG/ML SOAJ INJECT 75MG  ONCE EVERY 2 WEEKS 2 mL 5  . vitamin C (ASCORBIC ACID) 500 MG tablet Take 500 mg by  mouth daily.     No current facility-administered medications for this visit.    Review of Systems  All other systems reviewed and are negative.   PHYSICAL EXAMINATION: BP 140/80   Pulse 73   Resp 20   Ht 5\' 5"  (1.651 m)   Wt 185 lb (83.9 kg)   LMP  (LMP Unknown)   SpO2 97% Comment: RA  BMI 30.79 kg/m   Physical Exam Constitutional:      General: She is not in acute distress.    Appearance: Normal appearance. She is not ill-appearing.  Cardiovascular:     Rate and Rhythm: Normal rate.  Pulmonary:     Effort: Pulmonary effort is normal. No respiratory distress.  Musculoskeletal:     Cervical back: Normal range of motion.  Neurological:     General: No focal deficit present.     Mental Status: She is alert and oriented to person, place, and time.      Diagnostic Studies & Laboratory data:     Recent Radiology Findings:   NM PET (NETSPOT GA 35 DOTATATE) SKULL BASE TO MID THIGH  Result Date: 11/21/2020 CLINICAL DATA:  Neuroendocrine tumor of the lung. History of rectal carcinoma. History of RIGHT middle lobectomy. EXAM: NUCLEAR MEDICINE PET SKULL BASE TO THIGH TECHNIQUE: 4.7 mCi gallium 68 DOTATATE was injected intravenously. Full-ring PET imaging was performed from the skull base to thigh after the radiotracer. CT data was  obtained and used for attenuation correction and anatomic localization. COMPARISON:  Chest CT 05/22/2020 FINDINGS: NECK No radiotracer activity in neck lymph nodes. Incidental CT findings: None CHEST Patient status post RIGHT middle lobectomy for bronchial carcinoid. No nodularity within the lung by CT imaging. No radiotracer avid lesions in the RIGHT lung. No radiotracer avid mediastinal lymph nodes. No new lesions in LEFT lung. Incidental CT finding:None ABDOMEN/PELVIS No radiotracer avid activity within the rectal tissue. No abnormal radiotracer activity in abdominopelvic lymph nodes. No liver metastasis. Physiologic activity noted in the liver, spleen, adrenal glands and kidneys. Incidental CT findings:Large fibroid uterus unchanged SKELETON No focal activity to suggest skeletal metastasis. Incidental CT findings:None IMPRESSION: 1. No evidence of bronchial carcinoid recurrence in the thorax. 2. No evidence of metastatic neuroendocrine tumor on skull base to thigh DOTATATE PET scan. Electronically Signed   By: Suzy Bouchard M.D.   On: 11/21/2020 16:20       I have independently reviewed the above radiology studies  and reviewed the findings with the patient.   Recent Lab Findings: Lab Results  Component Value Date   WBC 5.8 10/29/2020   HGB 15.1 10/29/2020   HCT 45.4 10/29/2020   PLT 234 10/29/2020   GLUCOSE 91 10/29/2020   CHOL 286 (H) 10/29/2020   TRIG 136 10/29/2020   HDL 67 10/29/2020   LDLCALC 195 (H) 10/29/2020   ALT 23 10/29/2020   AST 26 10/29/2020   NA 142 10/29/2020   K 5.0 10/29/2020   CL 103 10/29/2020   CREATININE 0.80 10/29/2020   BUN 20 10/29/2020   CO2 27 10/29/2020   TSH 1.930 10/29/2020   INR 1.1 12/27/2018        Assessment / Plan:   60 year old female who presents with 1 year follow-up for a carcinoid tumor that was removed from the right middle lobe.  She has no complaints.  Will follow-up in 1 year with another CT scan.  Original surgery was in July  2020      Lajuana Matte 11/22/2020 5:49 PM

## 2020-11-22 NOTE — Telephone Encounter (Signed)
Inbound call from patient stating she was supposed to have some labs done after her procedure done in March.  Please advise.

## 2020-11-25 NOTE — Telephone Encounter (Signed)
The pt has been advised that labs have been entered. She will come tomorrow morning for the testing.

## 2020-11-26 ENCOUNTER — Other Ambulatory Visit: Payer: BC Managed Care – PPO

## 2020-11-26 DIAGNOSIS — C7A026 Malignant carcinoid tumor of the rectum: Secondary | ICD-10-CM

## 2020-11-28 LAB — CHROMOGRANIN A: Chromogranin A (ng/mL): 46.7 ng/mL (ref 0.0–101.8)

## 2020-12-23 ENCOUNTER — Other Ambulatory Visit: Payer: Self-pay | Admitting: Physician Assistant

## 2021-01-07 ENCOUNTER — Other Ambulatory Visit: Payer: Self-pay | Admitting: Physician Assistant

## 2021-01-14 ENCOUNTER — Other Ambulatory Visit: Payer: Self-pay | Admitting: Physician Assistant

## 2021-02-07 ENCOUNTER — Other Ambulatory Visit: Payer: Self-pay | Admitting: Physician Assistant

## 2021-02-26 ENCOUNTER — Ambulatory Visit: Payer: BC Managed Care – PPO | Admitting: Dermatology

## 2021-03-17 ENCOUNTER — Encounter: Payer: Self-pay | Admitting: Legal Medicine

## 2021-03-17 ENCOUNTER — Other Ambulatory Visit: Payer: Self-pay

## 2021-03-17 ENCOUNTER — Ambulatory Visit (INDEPENDENT_AMBULATORY_CARE_PROVIDER_SITE_OTHER): Payer: BC Managed Care – PPO | Admitting: Legal Medicine

## 2021-03-17 DIAGNOSIS — R030 Elevated blood-pressure reading, without diagnosis of hypertension: Secondary | ICD-10-CM | POA: Insufficient documentation

## 2021-03-17 NOTE — Progress Notes (Signed)
Established Patient Office Visit  Subjective:  Patient ID: Shannon [name], female    DOB: November 16, 1960  Age: 60 y.o. MRN: [id number]  CC:  Chief Complaint  Patient presents with   Hypertension    At [facility] center on Thursday, patient states BP was 150/102 the first time and second time 154/88. Patient has continued monitor BP since last Thursday.     HPI Shannon [name] presents for hypertension at orthopedics then came down  Past Medical History:  Diagnosis Date   Anxiety    Arthritis    Asthma    Cancer ([place])    rectal, Lung   Dyspnea    with exertion   Emphysema of lung ([place])    patient denies   History of kidney stones    passed   HLD (hyperlipidemia)    Pneumonia 02/2018   Rectal tumor    cancer   Seasonal allergies     Past Surgical History:  Procedure Laterality Date   ABLATION     endometrial   BIOPSY  10/10/2018   Procedure: BIOPSY;  Surgeon: Irving Copas., MD;  Location: [place];  Service: Gastroenterology;;   BIOPSY  06/12/2019   Procedure: BIOPSY;  Surgeon: Irving Copas., MD;  Location: [place];  Service: Gastroenterology;;   BIOPSY  09/30/2020   Procedure: BIOPSY;  Surgeon: Irving Copas., MD;  Location: [facility];  Service: Gastroenterology;;   COLONOSCOPY     DOPPLER ECHOCARDIOGRAPHY     ENDOSCOPIC MUCOSAL RESECTION N/A 10/10/2018   Procedure: ENDOSCOPIC MUCOSAL RESECTION;  Surgeon: Irving Copas., MD;  Location: [place];  Service: Gastroenterology;  Laterality: N/A;   EUS N/A 07/18/2018   Procedure: LOWER ENDOSCOPIC ULTRASOUND (EUS);  Surgeon: Irving Copas., MD;  Location: [place];  Service: Gastroenterology;  Laterality: N/A;   EUS N/A 10/10/2018   Procedure: LOWER ENDOSCOPIC ULTRASOUND (EUS);  Surgeon: Irving Copas., MD;  Location: [place];  Service: Gastroenterology;  Laterality: N/A;   EUS N/A 06/12/2019   Procedure: LOWER ENDOSCOPIC ULTRASOUND (EUS);   Surgeon: Irving Copas., MD;  Location: [place];  Service: Gastroenterology;  Laterality: N/A;   EUS N/A 09/30/2020   Procedure: LOWER ENDOSCOPIC ULTRASOUND (EUS);  Surgeon: Irving Copas., MD;  Location: [facility];  Service: Gastroenterology;  Laterality: N/A;   FLEXIBLE SIGMOIDOSCOPY N/A 07/18/2018   Procedure: FLEXIBLE SIGMOIDOSCOPY;  Surgeon: Rush Landmark Telford Nab., MD;  Location: [place];  Service: Gastroenterology;  Laterality: N/A;   FLEXIBLE SIGMOIDOSCOPY N/A 10/10/2018   Procedure: FLEXIBLE SIGMOIDOSCOPY;  Surgeon: Rush Landmark Telford Nab., MD;  Location: [place];  Service: Gastroenterology;  Laterality: N/A;   FLEXIBLE SIGMOIDOSCOPY N/A 06/12/2019   Procedure: FLEXIBLE SIGMOIDOSCOPY;  Surgeon: Rush Landmark Telford Nab., MD;  Location: [place];  Service: Gastroenterology;  Laterality: N/A;   FLEXIBLE SIGMOIDOSCOPY N/A 09/30/2020   Procedure: FLEXIBLE SIGMOIDOSCOPY;  Surgeon: Rush Landmark Telford Nab., MD;  Location: [facility];  Service: Gastroenterology;  Laterality: N/A;   HEMOSTASIS CLIP PLACEMENT  10/10/2018   Procedure: HEMOSTASIS CLIP PLACEMENT;  Surgeon: Irving Copas., MD;  Location: [place];  Service: Gastroenterology;;   LUNG CANCER SURGERY  12/30/2018   right lun- mid lobe , dr gerhardt   SUBMUCOSAL LIFTING INJECTION  10/10/2018   Procedure: SUBMUCOSAL LIFTING INJECTION;  Surgeon: Irving Copas., MD;  Location: [place];  Service: Gastroenterology;;   TEE WITHOUT CARDIOVERSION  02/2018   TONSILLECTOMY     TUBAL LIGATION  1987   VIDEO ASSISTED THORACOSCOPY (VATS)/WEDGE RESECTION Right 12/30/2018  Procedure: VIDEO ASSISTED THORACOSCOPY (VATS), MINI THORACOTOMY, RIGHT MIDDLE LOBECTOMY WITH LYMPH NODE DISSECTION;  Surgeon: Grace Isaac, MD;  Location: [place];  Service: Thoracic;  Laterality: Right;   VIDEO BRONCHOSCOPY N/A 10/12/2018   Procedure: VIDEO BRONCHOSCOPY;  Surgeon: Grace Isaac, MD;  Location: [facility];   Service: Thoracic;  Laterality: N/A;   VIDEO BRONCHOSCOPY N/A 12/30/2018   Procedure: VIDEO BRONCHOSCOPY;  Surgeon: Grace Isaac, MD;  Location: [facility];  Service: Thoracic;  Laterality: N/A;   WISDOM TOOTH EXTRACTION      Family History  Problem Relation Age of Onset   Heart disease Mother    COPD Mother    Emphysema Mother    Diabetes Father    Colon cancer Neg Hx    Colon polyps Neg Hx    Esophageal cancer Neg Hx    Rectal cancer Neg Hx    Stomach cancer Neg Hx     Social History   Socioeconomic History   Marital status: Married    Spouse name: Not on file   Number of children: Not on file   Years of education: Not on file   Highest education level: Not on file  Occupational History   Not on file  Tobacco Use   Smoking status: Former    Years: 33.00    Types: Cigarettes    Quit date: 2008    Years since quitting: 14.6   Smokeless tobacco: Never  Vaping Use   Vaping Use: Never used  Substance and Sexual Activity   Alcohol use: Never    Comment: rare   Drug use: Never   Sexual activity: Not Currently    Birth control/protection: Post-menopausal  Other Topics Concern   Not on file  Social History Narrative   Not on file   Social Determinants of Health   Financial Resource Strain: Not on file  Food Insecurity: Not on file  Transportation Needs: Not on file  Physical Activity: Not on file  Stress: Not on file  Social Connections: Not on file  Intimate Partner Violence: Not on file    Outpatient Medications Prior to Visit  Medication Sig Dispense Refill   acetaminophen (TYLENOL) 325 MG tablet Take 650 mg by mouth every 6 (six) hours as needed for moderate pain.     albuterol (PROVENTIL HFA;VENTOLIN HFA) 108 (90 Base) MCG/ACT inhaler Inhale 2 puffs into the lungs every 6 (six) hours as needed for wheezing or shortness of breath.     busPIRone (BUSPAR) 10 MG tablet TAKE 0.5 TABLETS BY MOUTH 2 TIMES DAILY. 90 tablet 2   calcium carbonate (TUMS - DOSED IN  MG ELEMENTAL CALCIUM) 500 MG chewable tablet Chew 1 tablet by mouth daily as needed for indigestion or heartburn.     Cholecalciferol (VITAMIN D-3) 125 MCG (5000 UT) TABS Take 5,000 Units by mouth every evening.     Cyanocobalamin (B-12) 2500 MCG TABS Take 2,500 mcg by mouth daily.     fluticasone (FLONASE) 50 MCG/ACT nasal spray Place 1 spray into both nostrils daily as needed for allergies or rhinitis.     LORazepam (ATIVAN) 0.5 MG tablet TAKE 1 TABLET BY MOUTH TWICE A DAY AS NEEDED FOR ANXIETY 60 tablet 1   meloxicam (MOBIC) 7.5 MG tablet TAKE 1 TABLET BY MOUTH TWICE A DAY AS NEEDED FOR PAIN 60 tablet 1   Multiple Vitamin (MULTIVITAMIN WITH MINERALS) TABS tablet Take 1 tablet by mouth daily.     PRALUENT 75 MG/ML SOAJ INJECT 75MG ONCE  EVERY 2 WEEKS 2 mL 5   atorvastatin (LIPITOR) 20 MG tablet TAKE 1 TABLET BY MOUTH EVERY DAY 90 tablet 0   vitamin C (ASCORBIC ACID) 500 MG tablet Take 500 mg by mouth daily.     No facility-administered medications prior to visit.    Allergies  Allergen Reactions   Hydrocodone Nausea Only    "terrible headache"   Other     UNSPECIFIED REACTION  Dog Dander    Levaquin [Levofloxacin In D5w] Nausea And Vomiting         ROS Review of Systems  Constitutional:  Negative for activity change and appetite change.  HENT:  Negative for congestion.   Eyes:  Negative for visual disturbance.  Respiratory:  Negative for chest tightness and shortness of breath.   Cardiovascular:  Negative for chest pain, palpitations and leg swelling.  Gastrointestinal:  Negative for abdominal distention and abdominal pain.  Genitourinary: Negative.   Musculoskeletal:  Negative for arthralgias and back pain.  Neurological: Negative.   Psychiatric/Behavioral: Negative.       Objective:    Physical Exam Vitals reviewed.  Constitutional:      Appearance: Normal appearance.  HENT:     Head: Normocephalic and atraumatic.     Right Ear: Tympanic membrane, ear canal and  external ear normal.     Left Ear: Tympanic membrane, ear canal and external ear normal.     Mouth/Throat:     Mouth: Mucous membranes are moist.     Pharynx: Oropharynx is clear.  Eyes:     Extraocular Movements: Extraocular movements intact.     Conjunctiva/sclera: Conjunctivae normal.     Pupils: Pupils are equal, round, and reactive to light.  Cardiovascular:     Rate and Rhythm: Normal rate and regular rhythm.     Pulses: Normal pulses.     Heart sounds: Normal heart sounds. No murmur heard.   No gallop.  Pulmonary:     Effort: Pulmonary effort is normal. No respiratory distress.     Breath sounds: Normal breath sounds. No wheezing.  Abdominal:     General: Abdomen is flat. Bowel sounds are normal. There is no distension.     Palpations: Abdomen is soft.     Tenderness: There is no abdominal tenderness.  Musculoskeletal:        General: Normal range of motion.     Cervical back: Normal range of motion and neck supple.  Skin:    General: Skin is warm.     Capillary Refill: Capillary refill takes less than 2 seconds.  Neurological:     General: No focal deficit present.     Mental Status: She is alert and oriented to person, place, and time.    BP 130/88   Pulse 75   Temp (!) 97.4 F (36.3 C)   Ht _0  (1.651 m)   Wt 182 lb 6.4 oz (82.7 kg)   LMP  (LMP Unknown)   SpO2 98%   BMI 30.35 kg/m  Wt Readings from Last 3 Encounters:  03/17/21 182 lb 6.4 oz (82.7 kg)  11/22/20 185 lb (83.9 kg)  10/29/20 188 lb 12.8 oz (85.6 kg)     Health Maintenance Due  Topic Date Due   Pneumococcal Vaccine 78-60 Years old (1 - PCV) Never done   MAMMOGRAM  Never done   Zoster Vaccines- Shingrix (1 of 2) Never done   PAP SMEAR-Modifier  Never done   COVID-19 Vaccine (4 - Booster for Pfizer series) 09/25/2020  INFLUENZA VACCINE  02/24/2021    There are no preventive care reminders to display for this patient.  Lab Results  Component Value Date   TSH 1.930 10/29/2020   Lab  Results  Component Value Date   WBC 5.8 10/29/2020   HGB 15.1 10/29/2020   HCT 45.4 10/29/2020   MCV 86 10/29/2020   PLT 234 10/29/2020   Lab Results  Component Value Date   NA 142 10/29/2020   K 5.0 10/29/2020   CO2 27 10/29/2020   GLUCOSE 91 10/29/2020   BUN 20 10/29/2020   CREATININE 0.80 10/29/2020   BILITOT 0.6 10/29/2020   ALKPHOS 82 10/29/2020   AST 26 10/29/2020   ALT 23 10/29/2020   PROT 7.0 10/29/2020   ALBUMIN 4.7 10/29/2020   CALCIUM 10.1 10/29/2020   ANIONGAP 7 01/02/2019   EGFR 85 10/29/2020   GFR 74.11 07/04/2018   Lab Results  Component Value Date   CHOL 286 (H) 10/29/2020   Lab Results  Component Value Date   HDL 67 10/29/2020   Lab Results  Component Value Date   LDLCALC 195 (H) 10/29/2020   Lab Results  Component Value Date   TRIG 136 10/29/2020   Lab Results  Component Value Date   CHOLHDL 4.3 10/29/2020   No results found for: HGBA1C    Assessment & Plan:   Diagnoses and all orders for this visit: Borderline hypertension  Patient has borderline hypertension and patient will continue checking at home    Follow-up: Return in 3 months (on 06/17/2021).    Reinaldo Meeker, MD

## 2021-03-18 ENCOUNTER — Other Ambulatory Visit: Payer: Self-pay | Admitting: Physician Assistant

## 2021-05-06 ENCOUNTER — Other Ambulatory Visit: Payer: Self-pay

## 2021-05-06 MED ORDER — PRALUENT 75 MG/ML ~~LOC~~ SOAJ: SUBCUTANEOUS | 5 refills | Status: DC

## 2021-05-14 ENCOUNTER — Other Ambulatory Visit: Payer: Self-pay

## 2021-05-14 ENCOUNTER — Encounter: Payer: Self-pay | Admitting: Physician Assistant

## 2021-05-14 ENCOUNTER — Ambulatory Visit (INDEPENDENT_AMBULATORY_CARE_PROVIDER_SITE_OTHER): Payer: BC Managed Care – PPO | Admitting: Physician Assistant

## 2021-05-14 VITALS — BP 128/82 | HR 72 | Temp 96.3°F | Ht 65.5 in | Wt 185.0 lb

## 2021-05-14 DIAGNOSIS — N3000 Acute cystitis without hematuria: Secondary | ICD-10-CM | POA: Diagnosis not present

## 2021-05-14 DIAGNOSIS — E782 Mixed hyperlipidemia: Secondary | ICD-10-CM

## 2021-05-14 DIAGNOSIS — E559 Vitamin D deficiency, unspecified: Secondary | ICD-10-CM

## 2021-05-14 DIAGNOSIS — Z23 Encounter for immunization: Secondary | ICD-10-CM | POA: Diagnosis not present

## 2021-05-14 DIAGNOSIS — F419 Anxiety disorder, unspecified: Secondary | ICD-10-CM

## 2021-05-14 DIAGNOSIS — C7A090 Malignant carcinoid tumor of the bronchus and lung: Secondary | ICD-10-CM

## 2021-05-14 LAB — POCT URINALYSIS DIP (CLINITEK)
Bilirubin, UA: NEGATIVE
Blood, UA: NEGATIVE
Glucose, UA: NEGATIVE mg/dL
Ketones, POC UA: NEGATIVE mg/dL
Nitrite, UA: NEGATIVE
POC PROTEIN,UA: NEGATIVE
Spec Grav, UA: 1.01 (ref 1.010–1.025)
Urobilinogen, UA: 0.2 E.U./dL
pH, UA: 7 (ref 5.0–8.0)

## 2021-05-14 MED ORDER — BUSPIRONE HCL 5 MG PO TABS: 5.0000 mg | ORAL_TABLET | Freq: Two times a day (BID) | ORAL | 5 refills | Status: DC

## 2021-05-14 MED ORDER — PRALUENT 75 MG/ML ~~LOC~~ SOAJ: SUBCUTANEOUS | 5 refills | Status: DC

## 2021-05-14 MED ORDER — MELOXICAM 7.5 MG PO TABS: 7.5000 mg | ORAL_TABLET | Freq: Two times a day (BID) | ORAL | 5 refills | Status: DC | PRN

## 2021-05-14 MED ORDER — NITROFURANTOIN MONOHYD MACRO 100 MG PO CAPS: 100.0000 mg | ORAL_CAPSULE | Freq: Two times a day (BID) | ORAL | 0 refills | Status: DC

## 2021-05-14 MED ORDER — LORAZEPAM 0.5 MG PO TABS: ORAL_TABLET | ORAL | 1 refills | Status: DC

## 2021-05-14 NOTE — Progress Notes (Signed)
Established Patient Office Visit  Subjective:  Patient ID: Shannon [name], female    DOB: October 18, 1960  Age: 60 y.o. MRN: [id number]  CC:  Chief Complaint  Patient presents with   Anxiety   Depression   Hyperlipidemia   Urinary Tract Infection    Sxs x 3 days, urgency, frequency, lower back pain, dysuria, feels like her bladder isnt empty    HPI Shannon [name] presents for follow up hyperlipidemia  Pt presents with hyperlipidemia.  Compliance with treatment has been good; she does follow up as directed and tries to  She denies experiencing any hypercholesterolemia related symptoms.  She is now taking praluent and tolerating medication well - due for labwork    Follow up of generalized anxiety disorder.  pt doing well on her current medications she takes buspar 22m 1/2 bid and ativan as needed - says controlling symptoms well    Follow up of personal history of malignant carcinoid tumor of rectum.  pt was found to have carcinoid tumor of rectum on screening colonoscopy which was removed 07/2018 - she follows with Dr  MRush Landmark She follows with Dr GPia Mauregularly  Pt has seen ortho regarding her leg pain - it has improved - she was found to have a tear around L4 - at this time taking mobic - has not been back for further injections because most of pain has resolved  Pt would like flu shot today  Past Medical History:  Diagnosis Date   Anxiety    Arthritis    Asthma    [place]    [place], [place]   Dyspnea    with exertion   [place]    patient denies   History of kidney stones    passed   HLD (hyperlipidemia)    Pneumonia 02/2018   Rectal tumor    cancer   Seasonal allergies     Past Surgical History:  Procedure Laterality Date   ABLATION     endometrial   BIOPSY  10/10/2018   Procedure: BIOPSY;  Surgeon: MIrving Copas, MD;  [place]  Service: Gastroenterology;;   BIOPSY  06/12/2019   Procedure: BIOPSY;  Surgeon:  MIrving Copas, MD;  [place]  Service: [place]astro[place]ology;;   BIOPSY  09/30/2020   Procedure: BIOPSY;  Surgeon: MIrving Copas, MD;  [facility];  Service: Gastroenterology;;   COLONOSCOPY     DOPPLER ECHOCARDIOGRAPHY     ENDOSCOPIC MUCOSAL RESECTION N/A 10/10/2018   Procedure: ENDOSCOPIC MUCOSAL RESECTION;  Surgeon: MIrving Copas, MD;  [place]  Service: Gastroenterology;  Laterality: N/A;   EUS N/A 07/18/2018   Procedure: LOWER ENDOSCOPIC ULTRASOUND (EUS);  Surgeon: MIrving Copas, MD;  [place]  Service: Gastroenterology;  Laterality: N/A;   EUS N/A 10/10/2018   Procedure: LOWER ENDOSCOPIC ULTRASOUND (EUS);  Surgeon: MIrving Copas, MD;  [place]  Service: Gastroenterology;  Laterality: N/A;   EUS N/A 06/12/2019   Procedure: LOWER ENDOSCOPIC ULTRASOUND (EUS);  Surgeon: MIrving Copas, MD;  [place]  Service: Gastroenterology;  Laterality: N/A;   EUS N/A 09/30/2020   Procedure: LOWER ENDOSCOPIC ULTRASOUND (EUS);  Surgeon: MIrving Copas, MD;  [facility];  Service: Gastroenterology;  Laterality: N/A;   FLEXIBLE SIGMOIDOSCOPY N/A 07/18/2018   Procedure: FLEXIBLE SIGMOIDOSCOPY;  Surgeon: MRush LandmarkGTelford Nab, MD;  [place]  Service: Gastroenterology;  Laterality: N/A;   FLEXIBLE SIGMOIDOSCOPY N/A 10/10/2018  Procedure: FLEXIBLE SIGMOIDOSCOPY;  Surgeon: Irving Copas., MD;  Location: [place];  Service: Gastroenterology;  Laterality: N/A;   FLEXIBLE SIGMOIDOSCOPY N/A 06/12/2019   Procedure: FLEXIBLE SIGMOIDOSCOPY;  Surgeon: Rush Landmark Telford Nab., MD;  Location: [place];  Service: Gastroenterology;  Laterality: N/A;   FLEXIBLE SIGMOIDOSCOPY N/A 09/30/2020   Procedure: FLEXIBLE SIGMOIDOSCOPY;  Surgeon: Rush Landmark Telford Nab., MD;  [facility];  Service: Gastroenterology;  Laterality: N/A;    HEMOSTASIS CLIP PLACEMENT  10/10/2018   Procedure: HEMOSTASIS CLIP PLACEMENT;  Surgeon: Irving Copas., MD;  Location: [facility] ENDOSCOPY;  Service: Gastroenterology;;   LUNG CANCER SURGERY  12/30/2018   right lun- mid lobe , dr gerhardt   SUBMUCOSAL LIFTING INJECTION  10/10/2018   Procedure: SUBMUCOSAL LIFTING INJECTION;  Surgeon: Irving Copas., MD;  Location: [facility] ENDOSCOPY;  Service: Gastroenterology;;   TEE WITHOUT CARDIOVERSION  02/2018   TONSILLECTOMY     TUBAL LIGATION  1987   VIDEO ASSISTED THORACOSCOPY (VATS)/WEDGE RESECTION Right 12/30/2018   Procedure: VIDEO ASSISTED THORACOSCOPY (VATS), MINI THORACOTOMY, RIGHT MIDDLE LOBECTOMY WITH LYMPH NODE DISSECTION;  Surgeon: Grace Isaac, MD;  Location: [place];  Service: Thoracic;  Laterality: Right;   VIDEO BRONCHOSCOPY N/A 10/12/2018   Procedure: VIDEO BRONCHOSCOPY;  Surgeon: Grace Isaac, MD;  Location[facility];  Service: Thoracic;  Laterality: N/A;   VIDEO BRONCHOSCOPY N/A 12/30/2018   Procedure: VIDEO BRONCHOSCOPY;  Surgeon: Grace Isaac, MD;  Location: [place];  Service: Thoracic;  Laterality: N/A;   WISDOM TOOTH EXTRACTION      Family History  Problem Relation Age of Onset   Heart disease Mother    COPD Mother    Emphysema Mother    Diabetes Father    Colon cancer Neg Hx    Colon polyps Neg Hx    Esophageal cancer Neg Hx    Rectal cancer Neg Hx    Stomach cancer Neg Hx     Social History   Socioeconomic History   Marital status: Married    Spouse name: Not on file   Number of children: Not on file   Years of education: Not on file   Highest education level: Not on file  Occupational History   Not on file  Tobacco Use   Smoking status: Former    Years: 33.00    Types: Cigarettes    Quit date: 2008    Years since quitting: 14.8   Smokeless tobacco: Never  Vaping Use   Vaping Use: Never used  Substance and Sexual Activity   Alcohol use: Never    Comment: rare   Drug use: Never   Sexual  activity: Not Currently    Birth control/protection: Post-menopausal  Other Topics Concern   Not on file  Social History Narrative   Not on file   Social Determinants of Health   Financial Resource Strain: Not on file  Food Insecurity: Not on file  Transportation Needs: Not on file  Physical Activity: Not on file  Stress: Not on file  Social Connections: Not on file  Intimate Partner Violence: Not on file     Current Outpatient Medications:    busPIRone (BUSPAR) 5 MG tablet, Take 1 tablet (5 mg total) by mouth 2 (two) times daily., Disp: 60 tablet, Rfl: 5   nitrofurantoin, macrocrystal-monohydrate, (MACROBID) 100 MG capsule, Take 1 capsule (100 mg total) by mouth 2 (two) times daily., Disp: 20 capsule, Rfl: 0   acetaminophen (TYLENOL) 325 MG tablet, Take 650 mg by mouth every 6 (  six) hours as needed for moderate pain., Disp: , Rfl:    albuterol (PROVENTIL HFA;VENTOLIN HFA) 108 (90 Base) MCG/ACT inhaler, Inhale 2 puffs into the lungs every 6 (six) hours as needed for wheezing or shortness of breath., Disp: , Rfl:    Alirocumab (PRALUENT) 75 MG/ML SOAJ, Once every 2 weeks, Disp: 2 mL, Rfl: 5   calcium carbonate (TUMS - DOSED IN MG ELEMENTAL CALCIUM) 500 MG chewable tablet, Chew 1 tablet by mouth daily as needed for indigestion or heartburn., Disp: , Rfl:    Cholecalciferol (VITAMIN D-3) 125 MCG (5000 UT) TABS, Take 5,000 Units by mouth every evening., Disp: , Rfl:    Cyanocobalamin (B-12) 2500 MCG TABS, Take 2,500 mcg by mouth daily., Disp: , Rfl:    fluticasone (FLONASE) 50 MCG/ACT nasal spray, Place 1 spray into both nostrils daily as needed for allergies or rhinitis., Disp: , Rfl:    LORazepam (ATIVAN) 0.5 MG tablet, TAKE 1 TABLET BY MOUTH TWICE A DAY AS NEEDED FOR ANXIETY, Disp: 60 tablet, Rfl: 1   meloxicam (MOBIC) 7.5 MG tablet, Take 1 tablet (7.5 mg total) by mouth 2 (two) times daily as needed. for pain, Disp: 60 tablet, Rfl: 5   Multiple Vitamin (MULTIVITAMIN WITH MINERALS) TABS  tablet, Take 1 tablet by mouth daily., Disp: , Rfl:    Allergies  Allergen Reactions   Hydrocodone Nausea Only    "terrible headache"   Other     UNSPECIFIED REACTION  Dog Dander    Levaquin [Levofloxacin In D5w] Nausea And Vomiting        CONSTITUTIONAL: Negative for chills, fatigue, fever, unintentional weight gain and unintentional weight loss.  E/N/T: Negative for ear pain, nasal congestion and sore throat.  CARDIOVASCULAR: Negative for chest pain, dizziness, palpitations and pedal edema.  RESPIRATORY: Negative for recent cough and dyspnea.  MSK: see HPI GU - pt has been having urinary urgency and dysuria for a few day INTEGUMENTARY: Negative for rash.  NEUROLOGICAL: Negative for dizziness and headaches.  PSYCHIATRIC: Negative for sleep disturbance and to question depression screen.  Negative for depression, negative for anhedonia.        Objective:   PHYSICAL EXAM:   VS: BP 128/82   Pulse 72   Temp (!) 96.3 F (35.7 C)   Ht 5' 5.5" (1.664 m)   Wt 185 lb (83.9 kg)   LMP  (LMP Unknown)   SpO2 92%   BMI 30.32 kg/m   GEN: Well nourished, well developed, in no acute distress  Cardiac: RRR; no murmurs, rubs, or gallops,no edema -  Respiratory:  normal respiratory rate and pattern with no distress - normal breath sounds with no rales, rhonchi, wheezes or rubs MS: no deformity or atrophy  Skin: warm and dry, no rash  Psych: euthymic mood, appropriate affect and demeanor Office Visit on 05/14/2021  Component Date Value Ref Range Status   Color, UA 05/14/2021 yellow  yellow Final   Clarity, UA 05/14/2021 clear  clear Final   Glucose, UA 05/14/2021 negative  negative mg/dL Final   Bilirubin, UA 05/14/2021 negative  negative Final   Ketones, POC UA 05/14/2021 negative  negative mg/dL Final   Spec Grav, UA 05/14/2021 1.010  1.010 - 1.025 Final   Blood, UA 05/14/2021 negative  negative Final   pH, UA 05/14/2021 7.0  5.0 - 8.0 Final   POC PROTEIN,UA 05/14/2021 negative   negative, trace Final   Urobilinogen, UA 05/14/2021 0.2  0.2 or 1.0 E.U./dL Final   Nitrite, UA  05/14/2021 Negative  Negative Final   Leukocytes, UA 05/14/2021 Small (1+) (A) Negative Final    Office Visit on 05/14/2021  Component Date Value Ref Range Status   Color, UA 05/14/2021 yellow  yellow Final   Clarity, UA 05/14/2021 clear  clear Final   Glucose, UA 05/14/2021 negative  negative mg/dL Final   Bilirubin, UA 05/14/2021 negative  negative Final   Ketones, POC UA 05/14/2021 negative  negative mg/dL Final   Spec Grav, UA 05/14/2021 1.010  1.010 - 1.025 Final   Blood, UA 05/14/2021 negative  negative Final   pH, UA 05/14/2021 7.0  5.0 - 8.0 Final   POC PROTEIN,UA 05/14/2021 negative  negative, trace Final   Urobilinogen, UA 05/14/2021 0.2  0.2 or 1.0 E.U./dL Final   Nitrite, UA 05/14/2021 Negative  Negative Final   Leukocytes, UA 05/14/2021 Small (1+) (A) Negative Final    BP 128/82   Pulse 72   Temp (!) 96.3 F (35.7 C)   Ht 5' 5.5" (1.664 m)   Wt 185 lb (83.9 kg)   LMP  (LMP Unknown)   SpO2 92%   BMI 30.32 kg/m  Wt Readings from Last 3 Encounters:  05/14/21 185 lb (83.9 kg)  03/17/21 182 lb 6.4 oz (82.7 kg)  11/22/20 185 lb (83.9 kg)     Health Maintenance Due  Topic Date Due   URINE MICROALBUMIN  Never done   MAMMOGRAM  Never done   Zoster Vaccines- Shingrix (1 of 2) Never done   PAP SMEAR-Modifier  Never done   COVID-19 Vaccine (4 - Booster for Pfizer series) 09/19/2020    There are no preventive care reminders to display for this patient.  Lab Results  Component Value Date   TSH 1.930 10/29/2020   Lab Results  Component Value Date   WBC 5.8 10/29/2020   HGB 15.1 10/29/2020   HCT 45.4 10/29/2020   MCV 86 10/29/2020   PLT 234 10/29/2020   Lab Results  Component Value Date   NA 142 10/29/2020   K 5.0 10/29/2020   CO2 27 10/29/2020   GLUCOSE 91 10/29/2020   BUN 20 10/29/2020   CREATININE 0.80 10/29/2020   BILITOT 0.6 10/29/2020   ALKPHOS 82  10/29/2020   AST 26 10/29/2020   ALT 23 10/29/2020   PROT 7.0 10/29/2020   ALBUMIN 4.7 10/29/2020   CALCIUM 10.1 10/29/2020   ANIONGAP 7 01/02/2019   EGFR 85 10/29/2020   GFR 74.11 07/04/2018   Lab Results  Component Value Date   CHOL 286 (H) 10/29/2020   Lab Results  Component Value Date   HDL 67 10/29/2020   Lab Results  Component Value Date   LDLCALC 195 (H) 10/29/2020   Lab Results  Component Value Date   TRIG 136 10/29/2020   Lab Results  Component Value Date   CHOLHDL 4.3 10/29/2020   No results found for: HGBA1C    Assessment & Plan:   Problem List Items Addressed This Visit       Digestive   Carcinoid tumor of rectum Continue follow up with specialists     Other   Mixed hyperlipidemia - Primary   Relevant Orders   CBC with Differential/Platelet   Comprehensive metabolic panel   Lipid panel Continue meds   Vitamin D insufficiency   Relevant Orders   VITAMIN D 25 Hydroxy (Vit-D Deficiency, Fractures)   Anxiety   Relevant Orders   TSH Continue meds   Pain in both lower extremities Follow up with  ortho as directed Need for flu shot Flucelvax given       Meds ordered this encounter  Medications   nitrofurantoin, macrocrystal-monohydrate, (MACROBID) 100 MG capsule    Sig: Take 1 capsule (100 mg total) by mouth 2 (two) times daily.    Dispense:  20 capsule    Refill:  0    Order Specific Question:   Supervising Provider    Answer:   COX, Lynder Parents   Alirocumab (PRALUENT) 59 MG/ML SOAJ    Sig: Once every 2 weeks    Dispense:  2 mL    Refill:  5    Order Specific Question:   Supervising Provider    Answer:   COX, Lynder Parents   busPIRone (BUSPAR) 5 MG tablet    Sig: Take 1 tablet (5 mg total) by mouth 2 (two) times daily.    Dispense:  60 tablet    Refill:  5    Order Specific Question:   Supervising Provider    Answer:   COX, KIRSTEN [[id number]]   LORazepam (ATIVAN) 0.5 MG tablet    Sig: TAKE 1 TABLET BY MOUTH TWICE A DAY AS  NEEDED FOR ANXIETY    Dispense:  60 tablet    Refill:  1    Not to exceed 4 additional fills before 04/14/2021    Order Specific Question:   Supervising Provider    Answer:   Rochel Brome [[id number]]   meloxicam (MOBIC) 7.5 MG tablet    Sig: Take 1 tablet (7.5 mg total) by mouth 2 (two) times daily as needed. for pain    Dispense:  60 tablet    Refill:  5    Order Specific Question:   Supervising Provider    Answer:   Shelton Silvas    Follow-up: Return in about 6 months (around 11/12/2021) for chronic fasting follow up.  Pt will call to schedule GYN appt for further evaluation   SARA R Tinya Cadogan, PA-C

## 2021-05-15 LAB — COMPREHENSIVE METABOLIC PANEL
ALT: 17 IU/L (ref 0–32)
AST: 23 IU/L (ref 0–40)
Albumin/Globulin Ratio: 2.6 — ABNORMAL HIGH (ref 1.2–2.2)
Albumin: 4.7 g/dL (ref 3.8–4.9)
Alkaline Phosphatase: 82 IU/L (ref 44–121)
BUN/Creatinine Ratio: 22 (ref 12–28)
BUN: 18 mg/dL (ref 8–27)
Bilirubin Total: 0.5 mg/dL (ref 0.0–1.2)
CO2: 24 mmol/L (ref 20–29)
Calcium: 10.5 mg/dL — ABNORMAL HIGH (ref 8.7–10.3)
Chloride: 102 mmol/L (ref 96–106)
Creatinine, Ser: 0.83 mg/dL (ref 0.57–1.00)
Globulin, Total: 1.8 g/dL (ref 1.5–4.5)
Glucose: 90 mg/dL (ref 70–99)
Potassium: 5.3 mmol/L — ABNORMAL HIGH (ref 3.5–5.2)
Sodium: 140 mmol/L (ref 134–144)
Total Protein: 6.5 g/dL (ref 6.0–8.5)
eGFR: 81 mL/min/{1.73_m2} (ref >59)

## 2021-05-15 LAB — CBC WITH DIFFERENTIAL/PLATELET
Basophils Absolute: 0 10*3/uL (ref 0.0–0.2)
Basos: 1 %
EOS (ABSOLUTE): 0.4 10*3/uL (ref 0.0–0.4)
Eos: 7 %
Hematocrit: 44.7 % (ref 34.0–46.6)
Hemoglobin: 15.5 g/dL (ref 11.1–15.9)
Immature Grans (Abs): 0 10*3/uL (ref 0.0–0.1)
Immature Granulocytes: 0 %
Lymphocytes Absolute: 1.8 10*3/uL (ref 0.7–3.1)
Lymphs: 34 %
MCH: 29.1 pg (ref 26.6–33.0)
MCHC: 34.7 g/dL (ref 31.5–35.7)
MCV: 84 fL (ref 79–97)
Monocytes Absolute: 0.4 10*3/uL (ref 0.1–0.9)
Monocytes: 8 %
Neutrophils Absolute: 2.7 10*3/uL (ref 1.4–7.0)
Neutrophils: 50 %
Platelets: 263 10*3/uL (ref 150–450)
RBC: 5.32 x10E6/uL — ABNORMAL HIGH (ref 3.77–5.28)
RDW: 13 % (ref 11.7–15.4)
WBC: 5.3 10*3/uL (ref 3.4–10.8)

## 2021-05-15 LAB — LIPID PANEL
Chol/HDL Ratio: 2.8 ratio (ref 0.0–4.4)
Cholesterol, Total: 208 mg/dL — ABNORMAL HIGH (ref 100–199)
HDL: 74 mg/dL (ref >39)
LDL Chol Calc (NIH): 115 mg/dL — ABNORMAL HIGH (ref 0–99)
Triglycerides: 109 mg/dL (ref 0–149)
VLDL Cholesterol Cal: 19 mg/dL (ref 5–40)

## 2021-05-15 LAB — TSH: TSH: 2.36 u[IU]/mL (ref 0.450–4.500)

## 2021-05-15 LAB — CARDIOVASCULAR RISK ASSESSMENT

## 2021-05-15 LAB — VITAMIN D 25 HYDROXY (VIT D DEFICIENCY, FRACTURES): Vit D, 25-Hydroxy: 41.4 ng/mL (ref 30.0–100.0)

## 2021-05-16 LAB — URINE CULTURE

## 2021-05-18 ENCOUNTER — Other Ambulatory Visit: Payer: Self-pay | Admitting: Physician Assistant

## 2021-05-18 DIAGNOSIS — E782 Mixed hyperlipidemia: Secondary | ICD-10-CM

## 2021-06-02 ENCOUNTER — Other Ambulatory Visit: Payer: Self-pay | Admitting: Physician Assistant

## 2021-06-02 DIAGNOSIS — E782 Mixed hyperlipidemia: Secondary | ICD-10-CM

## 2021-06-04 NOTE — Telephone Encounter (Signed)
Patient made aware, verbalized understanding. Patient does not use [facility] anymore

## 2021-08-22 ENCOUNTER — Other Ambulatory Visit: Payer: Self-pay

## 2021-08-22 ENCOUNTER — Ambulatory Visit: Payer: BC Managed Care – PPO | Admitting: Physician Assistant

## 2021-08-22 ENCOUNTER — Encounter: Payer: Self-pay | Admitting: Physician Assistant

## 2021-08-22 VITALS — BP 138/98 | HR 67 | Temp 98.0°F | Ht 65.5 in | Wt 180.0 lb

## 2021-08-22 DIAGNOSIS — I10 Essential (primary) hypertension: Secondary | ICD-10-CM

## 2021-08-22 DIAGNOSIS — F419 Anxiety disorder, unspecified: Secondary | ICD-10-CM

## 2021-08-22 LAB — COMPREHENSIVE METABOLIC PANEL
ALT: 14 IU/L (ref 0–32)
AST: 13 IU/L (ref 0–40)
Albumin/Globulin Ratio: 2.2 (ref 1.2–2.2)
Albumin: 4.9 g/dL (ref 3.8–4.9)
Alkaline Phosphatase: 80 IU/L (ref 44–121)
BUN/Creatinine Ratio: 22 (ref 12–28)
BUN: 20 mg/dL (ref 8–27)
Bilirubin Total: 0.6 mg/dL (ref 0.0–1.2)
CO2: 29 mmol/L (ref 20–29)
Calcium: 10.4 mg/dL — ABNORMAL HIGH (ref 8.7–10.3)
Chloride: 103 mmol/L (ref 96–106)
Creatinine, Ser: 0.9 mg/dL (ref 0.57–1.00)
Globulin, Total: 2.2 g/dL (ref 1.5–4.5)
Glucose: 82 mg/dL (ref 70–99)
Potassium: 5.2 mmol/L (ref 3.5–5.2)
Sodium: 141 mmol/L (ref 134–144)
Total Protein: 7.1 g/dL (ref 6.0–8.5)
eGFR: 73 mL/min/{1.73_m2} (ref >59)

## 2021-08-22 MED ORDER — LISINOPRIL 10 MG PO TABS: 10.0000 mg | ORAL_TABLET | Freq: Every day | ORAL | 2 refills | Status: DC

## 2021-08-22 MED ORDER — LORAZEPAM 1 MG PO TABS: 1.0000 mg | ORAL_TABLET | Freq: Every day | ORAL | 1 refills | Status: DC

## 2021-08-22 NOTE — Progress Notes (Signed)
Subjective:  Patient ID: Shannon [name], female    DOB: 1960-09-30  Age: 61 y.o. MRN: [id number]  Chief Complaint  Patient presents with   Hypertension    HPI  Pt states that yesterday at orthopedic appt her bp was elevated at almost 200/80 - she denies chest pain/sob/edema - states that when she had back injection yesterday bp elevated and had similar episode in August She states she had been checking bp at home and it was normal but has not checked in the past few months Bp today at 138/98 initially ---- recheck 142/102  Current Outpatient Medications on File Prior to Visit  Medication Sig Dispense Refill   acetaminophen (TYLENOL) 325 MG tablet Take 650 mg by mouth every 6 (six) hours as needed for moderate pain.     albuterol (PROVENTIL HFA;VENTOLIN HFA) 108 (90 Base) MCG/ACT inhaler Inhale 2 puffs into the lungs every 6 (six) hours as needed for wheezing or shortness of breath.     Alirocumab (PRALUENT) 75 MG/ML SOAJ INJECT 75MG  ONCE EVERY 2 WEEKS 2 mL 1   busPIRone (BUSPAR) 5 MG tablet Take 1 tablet (5 mg total) by mouth 2 (two) times daily. 60 tablet 5   calcium carbonate (TUMS - DOSED IN MG ELEMENTAL CALCIUM) 500 MG chewable tablet Chew 1 tablet by mouth daily as needed for indigestion or heartburn.     Cholecalciferol (VITAMIN D-3) 125 MCG (5000 UT) TABS Take 5,000 Units by mouth every evening.     Cyanocobalamin (B-12) 2500 MCG TABS Take 2,500 mcg by mouth daily.     meloxicam (MOBIC) 7.5 MG tablet Take 1 tablet (7.5 mg total) by mouth 2 (two) times daily as needed. for pain 60 tablet 5   Multiple Vitamin (MULTIVITAMIN WITH MINERALS) TABS tablet Take 1 tablet by mouth daily.     No current facility-administered medications on file prior to visit.   Past Medical History:  Diagnosis Date   Anxiety    Arthritis    Asthma    Cancer ([place])    rectal, Lung   Dyspnea    with exertion   Emphysema of lung ([place])    patient denies   History of kidney stones    passed   HLD  (hyperlipidemia)    Pneumonia 02/2018   Rectal tumor    cancer   Seasonal allergies    Past Surgical History:  Procedure Laterality Date   ABLATION     endometrial   BIOPSY  10/10/2018   Procedure: BIOPSY;  Surgeon: Irving Copas., MD;  Location: [place];  Service: Gastroenterology;;   BIOPSY  06/12/2019   Procedure: BIOPSY;  Surgeon: Irving Copas., MD;  Location: [place];  Service: Gastroenterology;;   BIOPSY  09/30/2020   Procedure: BIOPSY;  Surgeon: Irving Copas., MD;  [facility];  Service: Gastroenterology;;   COLONOSCOPY     DOPPLER ECHOCARDIOGRAPHY     ENDOSCOPIC MUCOSAL RESECTION N/A 10/10/2018   Procedure: ENDOSCOPIC MUCOSAL RESECTION;  Surgeon: Irving Copas., MD;  Location: [place];  Service: Gastroenterology;  Laterality: N/A;   EUS N/A 07/18/2018   Procedure: LOWER ENDOSCOPIC ULTRASOUND (EUS);  Surgeon: Irving Copas., MD;  Location: [place];  Service: Gastroenterology;  Laterality: N/A;   EUS N/A 10/10/2018   Procedure: LOWER ENDOSCOPIC ULTRASOUND (EUS);  Surgeon: Irving Copas., MD;  Location: [place];  Service: Gastroenterology;  Laterality: N/A;   EUS N/A 06/12/2019   Procedure: LOWER ENDOSCOPIC ULTRASOUND (EUS);  Surgeon: [name]  Brooke Bonito., MD;  Location: [place];  Service: Gastroenterology;  Laterality: N/A;   EUS N/A 09/30/2020   Procedure: LOWER ENDOSCOPIC ULTRASOUND (EUS);  Surgeon: Irving Copas., MD;  [facility];  Service: Gastroenterology;  Laterality: N/A;   FLEXIBLE SIGMOIDOSCOPY N/A 07/18/2018   Procedure: FLEXIBLE SIGMOIDOSCOPY;  Surgeon: Rush Landmark Telford Nab., MD;  Location: [place];  Service: Gastroenterology;  Laterality: N/A;   FLEXIBLE SIGMOIDOSCOPY N/A 10/10/2018   Procedure: FLEXIBLE SIGMOIDOSCOPY;  Surgeon: Rush Landmark Telford Nab., MD;  Location: [place];  Service: Gastroenterology;  Laterality: N/A;   FLEXIBLE  SIGMOIDOSCOPY N/A 06/12/2019   Procedure: FLEXIBLE SIGMOIDOSCOPY;  Surgeon: Rush Landmark Telford Nab., MD;  Location: [place];  Service: Gastroenterology;  Laterality: N/A;   FLEXIBLE SIGMOIDOSCOPY N/A 09/30/2020   Procedure: FLEXIBLE SIGMOIDOSCOPY;  Surgeon: Rush Landmark Telford Nab., MD;  [facility];  Service: Gastroenterology;  Laterality: N/A;   HEMOSTASIS CLIP PLACEMENT  10/10/2018   Procedure: HEMOSTASIS CLIP PLACEMENT;  Surgeon: Irving Copas., MD;  Location: [facility] ENDOSCOPY;  Service: Gastroenterology;;   LUNG CANCER SURGERY  12/30/2018   right lun- mid lobe , dr gerhardt   SUBMUCOSAL LIFTING INJECTION  10/10/2018   Procedure: SUBMUCOSAL LIFTING INJECTION;  Surgeon: Irving Copas., MD;  Location: [facility] ENDOSCOPY;  Service: Gastroenterology;;   TEE WITHOUT CARDIOVERSION  02/2018   TONSILLECTOMY     TUBAL LIGATION  1987   VIDEO ASSISTED THORACOSCOPY (VATS)/WEDGE RESECTION Right 12/30/2018   Procedure: VIDEO ASSISTED THORACOSCOPY (VATS), MINI THORACOTOMY, RIGHT MIDDLE LOBECTOMY WITH LYMPH NODE DISSECTION;  Surgeon: Grace Isaac, MD;  Location: [place];  Service: Thoracic;  Laterality: Right;   VIDEO BRONCHOSCOPY N/A 10/12/2018   Procedure: VIDEO BRONCHOSCOPY;  Surgeon: Grace Isaac, MD;  Location: [facility];  Service: Thoracic;  Laterality: N/A;   VIDEO BRONCHOSCOPY N/A 12/30/2018   Procedure: VIDEO BRONCHOSCOPY;  Surgeon: Grace Isaac, MD;  Location: [place];  Service: Thoracic;  Laterality: N/A;   WISDOM TOOTH EXTRACTION      Family History  Problem Relation Age of Onset   Heart disease Mother    COPD Mother    Emphysema Mother    Diabetes Father    Colon cancer Neg Hx    Colon polyps Neg Hx    Esophageal cancer Neg Hx    Rectal cancer Neg Hx    Stomach cancer Neg Hx    Social History   Socioeconomic History   Marital status: Married    Spouse name: Not on file   Number of children: Not on file   Years of education: Not on file   Highest  education level: Not on file  Occupational History   Not on file  Tobacco Use   Smoking status: Former    Years: 33.00    Types: Cigarettes    Quit date: 2008    Years since quitting: 15.0   Smokeless tobacco: Never  Vaping Use   Vaping Use: Never used  Substance and Sexual Activity   Alcohol use: Never    Comment: rare   Drug use: Never   Sexual activity: Not Currently    Birth control/protection: Post-menopausal  Other Topics Concern   Not on file  Social History Narrative   Not on file   Social Determinants of Health   Financial Resource Strain: Not on file  Food Insecurity: Not on file  Transportation Needs: Not on file  Physical Activity: Not on file  Stress: Not on file  Social Connections: Not on file    Review  of Systems CONSTITUTIONAL: Negative for chills, fatigue, fever, unintentional weight gain and unintentional weight loss.  CARDIOVASCULAR: Negative for chest pain, dizziness, palpitations and pedal edema.  RESPIRATORY: Negative for recent cough and dyspnea.  GASTROINTESTINAL: Negative for abdominal pain, acid reflux symptoms, constipation, diarrhea, nausea and vomiting.        Objective:  BP (!) 138/98 (BP Location: Right Arm, Patient Position: Sitting)    Pulse 67    Temp 98 F (36.7 C) (Temporal)    Ht 5' 5.5" (1.664 m)    Wt 180 lb (81.6 kg)    LMP  (LMP Unknown)    SpO2 98%    BMI 29.50 kg/m   BP/Weight 08/22/2021 05/14/2021 2/77/8242  Systolic BP 353 614 431  Diastolic BP 98 82 88  Wt. (Lbs) 180 185 182.4  BMI 29.5 30.32 30.35   PHYSICAL EXAM:   VS: BP (!) 138/98 (BP Location: Right Arm, Patient Position: Sitting)    Pulse 67    Temp 98 F (36.7 C) (Temporal)    Ht 5' 5.5" (1.664 m)    Wt 180 lb (81.6 kg)    LMP  (LMP Unknown)    SpO2 98%    BMI 29.50 kg/m  Repeat bp - 142/102  GEN: Well nourished, well developed, in no acute distress  Cardiac: RRR; no murmurs, rubs, or gallops,no edema - no significant varicosities Respiratory:  normal  respiratory rate and pattern with no distress - normal breath sounds with no rales, rhonchi, wheezes or rubs GI: normal bowel sounds, no masses or tenderness Psych: euthymic mood, appropriate affect and demeanor  EKG normal Lab Results  Component Value Date   WBC 5.3 05/14/2021   HGB 15.5 05/14/2021   HCT 44.7 05/14/2021   PLT 263 05/14/2021   GLUCOSE 90 05/14/2021   CHOL 208 (H) 05/14/2021   TRIG 109 05/14/2021   HDL 74 05/14/2021   LDLCALC 115 (H) 05/14/2021   ALT 17 05/14/2021   AST 23 05/14/2021   NA 140 05/14/2021   K 5.3 (H) 05/14/2021   CL 102 05/14/2021   CREATININE 0.83 05/14/2021   BUN 18 05/14/2021   CO2 24 05/14/2021   TSH 2.360 05/14/2021   INR 1.1 12/27/2018      Assessment & Plan:   Problem List Items Addressed This Visit       Other   Anxiety   Relevant Medications   LORazepam (ATIVAN) 1 MG tablet   Other Visit Diagnoses     Primary hypertension    -  Primary   Relevant Medications   lisinopril (ZESTRIL) 10 MG tablet   Other Relevant Orders   EKG 12-Lead   Comprehensive metabolic panel     .  Meds ordered this encounter  Medications   LORazepam (ATIVAN) 1 MG tablet    Sig: Take 1 tablet (1 mg total) by mouth at bedtime.    Dispense:  30 tablet    Refill:  1    Order Specific Question:   Supervising Provider    Answer:   Shelton Silvas   lisinopril (ZESTRIL) 10 MG tablet    Sig: Take 1 tablet (10 mg total) by mouth daily.    Dispense:  30 tablet    Refill:  2    Order Specific Question:   Supervising Provider    AnswerShelton Silvas    Orders Placed This Encounter  Procedures   Comprehensive metabolic panel   EKG 54-MGQQ     Follow-up:  Return in about 3 weeks (around 09/12/2021) for chronic fasting follow up.  An After Visit Summary was printed and given to the patient.  Yetta Flock [facility] [phone]

## 2021-08-23 ENCOUNTER — Other Ambulatory Visit: Payer: Self-pay | Admitting: Physician Assistant

## 2021-08-23 DIAGNOSIS — I10 Essential (primary) hypertension: Secondary | ICD-10-CM

## 2021-08-26 ENCOUNTER — Other Ambulatory Visit: Payer: Self-pay | Admitting: Physician Assistant

## 2021-09-11 ENCOUNTER — Other Ambulatory Visit: Payer: Self-pay

## 2021-09-11 ENCOUNTER — Ambulatory Visit: Payer: BC Managed Care – PPO | Admitting: Physician Assistant

## 2021-09-11 ENCOUNTER — Encounter: Payer: Self-pay | Admitting: Physician Assistant

## 2021-09-11 VITALS — BP 128/80 | HR 77 | Temp 97.2°F | Ht 65.5 in | Wt 181.8 lb

## 2021-09-11 DIAGNOSIS — I10 Essential (primary) hypertension: Secondary | ICD-10-CM

## 2021-09-11 DIAGNOSIS — E782 Mixed hyperlipidemia: Secondary | ICD-10-CM

## 2021-09-11 DIAGNOSIS — E559 Vitamin D deficiency, unspecified: Secondary | ICD-10-CM

## 2021-09-11 DIAGNOSIS — Z23 Encounter for immunization: Secondary | ICD-10-CM | POA: Diagnosis not present

## 2021-09-11 NOTE — Progress Notes (Signed)
Subjective:  Patient ID: Shannon [name], female    DOB: 22-Jul-1961  Age: 61 y.o. MRN: [id number]  Chief Complaint  Patient presents with   Hypertension    HPI  Pt in for follow up of hypertension .  She states she has been feeling fine and currently taking zestril 10mg  qd - denies chest pain or shortness of breath Has been checking bp at home which has been stable with readings averaging 120-130/70-80s  Pt would like shingrix vaccine today Current Outpatient Medications on File Prior to Visit  Medication Sig Dispense Refill   acetaminophen (TYLENOL) 325 MG tablet Take 650 mg by mouth every 6 (six) hours as needed for moderate pain.     albuterol (PROVENTIL HFA;VENTOLIN HFA) 108 (90 Base) MCG/ACT inhaler Inhale 2 puffs into the lungs every 6 (six) hours as needed for wheezing or shortness of breath.     Alirocumab (PRALUENT) 75 MG/ML SOAJ INJECT 75MG  ONCE EVERY 2 WEEKS 2 mL 1   busPIRone (BUSPAR) 10 MG tablet TAKE HALF TABLET BY MOUTH TWICE DAILY 90 tablet 1   calcium carbonate (TUMS - DOSED IN MG ELEMENTAL CALCIUM) 500 MG chewable tablet Chew 1 tablet by mouth daily as needed for indigestion or heartburn.     Cholecalciferol (VITAMIN D-3) 125 MCG (5000 UT) TABS Take 5,000 Units by mouth every evening.     Cyanocobalamin (B-12) 2500 MCG TABS Take 2,500 mcg by mouth daily.     lisinopril (ZESTRIL) 10 MG tablet TAKE 1 TABLET(10 MG) BY MOUTH DAILY 30 tablet 2   LORazepam (ATIVAN) 1 MG tablet Take 1 tablet (1 mg total) by mouth at bedtime. 30 tablet 1   meloxicam (MOBIC) 7.5 MG tablet Take 1 tablet (7.5 mg total) by mouth 2 (two) times daily as needed. for pain 60 tablet 5   Multiple Vitamin (MULTIVITAMIN WITH MINERALS) TABS tablet Take 1 tablet by mouth daily.     No current facility-administered medications on file prior to visit.   Past Medical History:  Diagnosis Date   Anxiety    Arthritis    Asthma    Cancer ([place])    rectal, Lung   Dyspnea    with exertion   Emphysema of  lung ([place])    patient denies   History of kidney stones    passed   HLD (hyperlipidemia)    Pneumonia 02/2018   Rectal tumor    cancer   Seasonal allergies    Past Surgical History:  Procedure Laterality Date   ABLATION     endometrial   BIOPSY  10/10/2018   Procedure: BIOPSY;  Surgeon: Irving Copas., MD;  Location: [place];  Service: Gastroenterology;;   BIOPSY  06/12/2019   Procedure: BIOPSY;  Surgeon: Irving Copas., MD;  Location: [place];  Service: Gastroenterology;;   BIOPSY  09/30/2020   Procedure: BIOPSY;  Surgeon: Irving Copas., MD;  Location: [facility];  Service: Gastroenterology;;   COLONOSCOPY     DOPPLER ECHOCARDIOGRAPHY     ENDOSCOPIC MUCOSAL RESECTION N/A 10/10/2018   Procedure: ENDOSCOPIC MUCOSAL RESECTION;  Surgeon: Irving Copas., MD;  Location: [place];  Service: Gastroenterology;  Laterality: N/A;   EUS N/A 07/18/2018   Procedure: LOWER ENDOSCOPIC ULTRASOUND (EUS);  Surgeon: Irving Copas., MD;  Location: [place];  Service: Gastroenterology;  Laterality: N/A;   EUS N/A 10/10/2018   Procedure: LOWER ENDOSCOPIC ULTRASOUND (EUS);  Surgeon: Irving Copas., MD;  Location: [place];  Service: Gastroenterology;  Laterality: N/A;  EUS N/A 06/12/2019   Procedure: LOWER ENDOSCOPIC ULTRASOUND (EUS);  Surgeon: Irving Copas., MD;  Location: [place];  Service: Gastroenterology;  Laterality: N/A;   EUS N/A 09/30/2020   Procedure: LOWER ENDOSCOPIC ULTRASOUND (EUS);  Surgeon: Irving Copas., MD;  Location: [facility];  Service: Gastroenterology;  Laterality: N/A;   FLEXIBLE SIGMOIDOSCOPY N/A 07/18/2018   Procedure: FLEXIBLE SIGMOIDOSCOPY;  Surgeon: Rush Landmark Telford Nab., MD;  Location: [place];  Service: Gastroenterology;  Laterality: N/A;   FLEXIBLE SIGMOIDOSCOPY N/A 10/10/2018   Procedure: FLEXIBLE SIGMOIDOSCOPY;  Surgeon: Rush Landmark Telford Nab., MD;  Location: [place];  Service: Gastroenterology;  Laterality: N/A;   FLEXIBLE SIGMOIDOSCOPY N/A 06/12/2019   Procedure: FLEXIBLE SIGMOIDOSCOPY;  Surgeon: Rush Landmark Telford Nab., MD;  Location: [place];  Service: Gastroenterology;  Laterality: N/A;   FLEXIBLE SIGMOIDOSCOPY N/A 09/30/2020   Procedure: FLEXIBLE SIGMOIDOSCOPY;  Surgeon: Rush Landmark Telford Nab., MD;  Location: [facility];  Service: Gastroenterology;  Laterality: N/A;   HEMOSTASIS CLIP PLACEMENT  10/10/2018   Procedure: HEMOSTASIS CLIP PLACEMENT;  Surgeon: Irving Copas., MD;  Location: [facility];  Service: Gastroenterology;;   LUNG CANCER SURGERY  12/30/2018   right lun- mid lobe , dr gerhardt   SUBMUCOSAL LIFTING INJECTION  10/10/2018   Procedure: SUBMUCOSAL LIFTING INJECTION;  Surgeon: Irving Copas., MD;  Location: [facility];  Service: Gastroenterology;;   TEE WITHOUT CARDIOVERSION  02/2018   TONSILLECTOMY     TUBAL LIGATION  1987   VIDEO ASSISTED THORACOSCOPY (VATS)/WEDGE RESECTION Right 12/30/2018   Procedure: VIDEO ASSISTED THORACOSCOPY (VATS), MINI THORACOTOMY, RIGHT MIDDLE LOBECTOMY WITH LYMPH NODE DISSECTION;  Surgeon: Grace Isaac, MD;  Location: [place];  Service: Thoracic;  Laterality: Right;   VIDEO BRONCHOSCOPY N/A 10/12/2018   Procedure: VIDEO BRONCHOSCOPY;  Surgeon: Grace Isaac, MD;  Location: [facility];  Service: Thoracic;  Laterality: N/A;   VIDEO BRONCHOSCOPY N/A 12/30/2018   Procedure: VIDEO BRONCHOSCOPY;  Surgeon: Grace Isaac, MD;  Location: [place];  Service: Thoracic;  Laterality: N/A;   WISDOM TOOTH EXTRACTION      Family History  Problem Relation Age of Onset   Heart disease Mother    COPD Mother    Emphysema Mother    Diabetes Father    Colon cancer Neg Hx    Colon polyps Neg Hx    Esophageal cancer Neg Hx    Rectal cancer Neg Hx    Stomach cancer Neg Hx    Social History   Socioeconomic History   Marital status: Married    Spouse name: Not on file   Number of  children: Not on file   Years of education: Not on file   Highest education level: Not on file  Occupational History   Not on file  Tobacco Use   Smoking status: Former    Years: 33.00    Types: Cigarettes    Quit date: 2008    Years since quitting: 15.1   Smokeless tobacco: Never  Vaping Use   Vaping Use: Never used  Substance and Sexual Activity   Alcohol use: Never    Comment: rare   Drug use: Never   Sexual activity: Not Currently    Birth control/protection: Post-menopausal  Other Topics Concern   Not on file  Social History Narrative   Not on file   Social Determinants of Health   Financial Resource Strain: Not on file  Food Insecurity: Not on file  Transportation Needs: Not on file  Physical Activity: Not on file  Stress: Not on file  Social Connections: Not on file    Review of Systems CONSTITUTIONAL: Negative for chills, fatigue, fever, unintentional weight gain and unintentional weight loss.  CARDIOVASCULAR: Negative for chest pain, dizziness, palpitations and pedal edema.  RESPIRATORY: Negative for recent cough and dyspnea.  GASTROINTESTINAL: Negative for abdominal pain, acid reflux symptoms, constipation, diarrhea, nausea and vomiting.   INTEGUMENTARY: Negative for rash.        Objective:  PHYSICAL EXAM:   VS: BP 128/80    Pulse 77    Temp (!) 97.2 F (36.2 C)    Ht 5' 5.5" (1.664 m)    Wt 181 lb 12.8 oz (82.5 kg)    LMP  (LMP Unknown)    SpO2 95%    BMI 29.79 kg/m   GEN: Well nourished, well developed, in no acute distress  Cardiac: RRR; no murmurs, rubs, or gallops,no edema -  Respiratory:  normal respiratory rate and pattern with no distress - normal breath sounds with no rales, rhonchi, wheezes or rubs  Skin: warm and dry, no rash   Psych: euthymic mood, appropriate affect and demeanor     Lab Results  Component Value Date   WBC 5.3 05/14/2021   HGB 15.5 05/14/2021   HCT 44.7 05/14/2021   PLT 263 05/14/2021   GLUCOSE 82 08/22/2021    CHOL 208 (H) 05/14/2021   TRIG 109 05/14/2021   HDL 74 05/14/2021   LDLCALC 115 (H) 05/14/2021   ALT 14 08/22/2021   AST 13 08/22/2021   NA 141 08/22/2021   K 5.2 08/22/2021   CL 103 08/22/2021   CREATININE 0.90 08/22/2021   BUN 20 08/22/2021   CO2 29 08/22/2021   TSH 2.360 05/14/2021   INR 1.1 12/27/2018      Assessment & Plan:   Problem List Items Addressed This Visit       Other   Mixed hyperlipidemia   Relevant Orders   Lipid panel   Vitamin D insufficiency   Relevant Orders   VITAMIN D 25 Hydroxy (Vit-D Deficiency, Fractures)   Other Visit Diagnoses     Primary hypertension    -  Primary   Relevant Orders   CBC with Differential/Platelet   Comprehensive metabolic panel   TSH Continue current meds   Need for vaccination for zoster       Relevant Orders   Varicella-zoster vaccine IM (Shingrix) (Completed)     .  No orders of the defined types were placed in this encounter.   Orders Placed This Encounter  Procedures   Varicella-zoster vaccine IM (Shingrix)   CBC with Differential/Platelet   Comprehensive metabolic panel   TSH   Lipid panel   VITAMIN D 25 Hydroxy (Vit-D Deficiency, Fractures)     Follow-up: Return in about 3 months (around 12/09/2021) for chronic fasting follow up  --- 3 weeks nurse visit bp check.  An After Visit Summary was printed and given to the patient.  Yetta Flock [facility] [phone]

## 2021-09-12 LAB — CBC WITH DIFFERENTIAL/PLATELET
Basophils Absolute: 0.1 10*3/uL (ref 0.0–0.2)
Basos: 1 %
EOS (ABSOLUTE): 0.2 10*3/uL (ref 0.0–0.4)
Eos: 3 %
Hematocrit: 43.7 % (ref 34.0–46.6)
Hemoglobin: 14.2 g/dL (ref 11.1–15.9)
Immature Grans (Abs): 0 10*3/uL (ref 0.0–0.1)
Immature Granulocytes: 0 %
Lymphocytes Absolute: 2.1 10*3/uL (ref 0.7–3.1)
Lymphs: 28 %
MCH: 28.2 pg (ref 26.6–33.0)
MCHC: 32.5 g/dL (ref 31.5–35.7)
MCV: 87 fL (ref 79–97)
Monocytes Absolute: 0.5 10*3/uL (ref 0.1–0.9)
Monocytes: 7 %
Neutrophils Absolute: 4.5 10*3/uL (ref 1.4–7.0)
Neutrophils: 61 %
Platelets: 257 10*3/uL (ref 150–450)
RBC: 5.03 x10E6/uL (ref 3.77–5.28)
RDW: 13.1 % (ref 11.7–15.4)
WBC: 7.4 10*3/uL (ref 3.4–10.8)

## 2021-09-12 LAB — COMPREHENSIVE METABOLIC PANEL
ALT: 14 IU/L (ref 0–32)
AST: 21 IU/L (ref 0–40)
Albumin/Globulin Ratio: 2.3 — ABNORMAL HIGH (ref 1.2–2.2)
Albumin: 4.5 g/dL (ref 3.8–4.9)
Alkaline Phosphatase: 73 IU/L (ref 44–121)
BUN/Creatinine Ratio: 26 (ref 12–28)
BUN: 21 mg/dL (ref 8–27)
Bilirubin Total: 0.4 mg/dL (ref 0.0–1.2)
CO2: 26 mmol/L (ref 20–29)
Calcium: 10.3 mg/dL (ref 8.7–10.3)
Chloride: 103 mmol/L (ref 96–106)
Creatinine, Ser: 0.82 mg/dL (ref 0.57–1.00)
Globulin, Total: 2 g/dL (ref 1.5–4.5)
Glucose: 84 mg/dL (ref 70–99)
Potassium: 5 mmol/L (ref 3.5–5.2)
Sodium: 141 mmol/L (ref 134–144)
Total Protein: 6.5 g/dL (ref 6.0–8.5)
eGFR: 82 mL/min/{1.73_m2} (ref >59)

## 2021-09-12 LAB — CARDIOVASCULAR RISK ASSESSMENT

## 2021-09-12 LAB — LIPID PANEL
Chol/HDL Ratio: 2.1 ratio (ref 0.0–4.4)
Cholesterol, Total: 156 mg/dL (ref 100–199)
HDL: 76 mg/dL (ref >39)
LDL Chol Calc (NIH): 65 mg/dL (ref 0–99)
Triglycerides: 78 mg/dL (ref 0–149)
VLDL Cholesterol Cal: 15 mg/dL (ref 5–40)

## 2021-09-12 LAB — TSH: TSH: 1.24 u[IU]/mL (ref 0.450–4.500)

## 2021-09-12 LAB — VITAMIN D 25 HYDROXY (VIT D DEFICIENCY, FRACTURES): Vit D, 25-Hydroxy: 43.5 ng/mL (ref 30.0–100.0)

## 2021-09-16 ENCOUNTER — Other Ambulatory Visit: Payer: Self-pay | Admitting: Physician Assistant

## 2021-09-16 DIAGNOSIS — F419 Anxiety disorder, unspecified: Secondary | ICD-10-CM

## 2021-09-16 MED ORDER — LORAZEPAM 1 MG PO TABS: 1.0000 mg | ORAL_TABLET | Freq: Every day | ORAL | 1 refills | Status: DC

## 2021-09-24 ENCOUNTER — Other Ambulatory Visit: Payer: Self-pay | Admitting: Physician Assistant

## 2021-09-24 DIAGNOSIS — E782 Mixed hyperlipidemia: Secondary | ICD-10-CM

## 2021-10-02 ENCOUNTER — Ambulatory Visit: Payer: BC Managed Care – PPO

## 2021-10-02 VITALS — BP 138/78

## 2021-10-02 DIAGNOSIS — R030 Elevated blood-pressure reading, without diagnosis of hypertension: Secondary | ICD-10-CM

## 2021-10-02 NOTE — Progress Notes (Signed)
Patient in office for BP check. BP in office 138/78. Compared with home machine which read 155/73. Provider aware of both readings and home reading log brought with patient. Patient is to continue lisinopril 10 mg and follow up in 3 weeks with provider. She did complain of sleeplessness, provider recommended she change time of day medication is taken. Patient VU of all recommendations.  ? ?[name] Lark 10/02/21 2:22 PM ? ?

## 2021-10-10 ENCOUNTER — Telehealth: Payer: Self-pay | Admitting: Pulmonary Disease

## 2021-10-10 NOTE — Telephone Encounter (Signed)
Fax received from Dr. Edmonia Lynch with [facility] to perform a Left total hip replacement on patient.  Patient needs surgery clearance. Patient was seen on 06/01/2018. She has been called to schedule an appt. Office protocol is a risk assessment can be sent to surgeon if patient has been seen in 60 days or less.  ? ?Sending to Dr. Valeta Harms for risk assessment or recommendations if patient needs to be seen in office prior to surgical procedure.   ? ?ATC patient to get her scheduled for a CONSULT appt, LMTCB Please schedule when she calls back ?

## 2021-10-10 NOTE — Telephone Encounter (Signed)
Next Appt ?With Pulmonology (Ogilvie, DO) ?11/17/2021 at 3:30 PM ?

## 2021-10-13 ENCOUNTER — Telehealth: Payer: Self-pay

## 2021-10-13 NOTE — Telephone Encounter (Signed)
LMTRC to schedule surgery clearance appointment for left total hip replacement.  ? ?She is to call back and get this scheduled per Gay Filler.  ? ?Shannon Shannon 10/13/21 1:53 PM ? ?

## 2021-10-15 ENCOUNTER — Telehealth: Payer: Self-pay

## 2021-10-15 NOTE — Telephone Encounter (Signed)
error 

## 2021-10-16 ENCOUNTER — Encounter: Payer: Self-pay | Admitting: Pulmonary Disease

## 2021-10-16 ENCOUNTER — Ambulatory Visit (INDEPENDENT_AMBULATORY_CARE_PROVIDER_SITE_OTHER): Payer: BC Managed Care – PPO | Admitting: Pulmonary Disease

## 2021-10-16 ENCOUNTER — Other Ambulatory Visit: Payer: Self-pay

## 2021-10-16 VITALS — BP 142/94 | HR 71 | Temp 98.3°F | Ht 65.0 in | Wt 185.0 lb

## 2021-10-16 DIAGNOSIS — Z01811 Encounter for preprocedural respiratory examination: Secondary | ICD-10-CM

## 2021-10-16 DIAGNOSIS — J432 Centrilobular emphysema: Secondary | ICD-10-CM | POA: Diagnosis not present

## 2021-10-16 NOTE — Progress Notes (Signed)
? ?Synopsis: Referred in September 2019 for shortness of breath by Marge Duncans, PA-C ? ?Subjective:  ? ?PATIENT ID: Shannon [name] GENDER: female DOB: 12/12/1960, MRN: [id number] ? ?Chief Complaint  ?Patient presents with  ? Consult  ?  Patient is here to talk about surgical clearance for hip replacement  ? ? ?She was diagnosed with bronchitis in July followed by pneumonia. She was treated with antibiotics and prednisone. Unsure how long but she felt better after. She was started on symbicort as well. Once she felt better she stopped taking most of her medications. She started taking her symbicort again she felt worse for a little bit. After stopping the inhaler she felt better. Currently she is exercising and walking more. Not using an inhaler.  ? ?Former smoker, quit 12 years ago, history of asthma, lots of second hand smoker, mother was a smoker. She was in the ED as peds for asthma treatments. She did have allergy testing as a child but does not remember any details. Normally had 1-2 episodes of bronchitis with when she was smoking. This has stopped since she stopped smoking.  ? 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  Previously seen by Dr. Valeta Harms however lost to follow-up with last visit in 2019. Previously walked 3 miles daily however limited due to left hip pain. Denies shortness of breath, cough or wheezing. Shortness of breath with strenuous activity. 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 ? ?Quit smoking 15 years ago. Started at 27 and stopped in her 4s. 1 ppd x30 years. ? ?Past Medical History:  ?Diagnosis Date  ? Anxiety   ? Arthritis   ? Asthma   ? Cancer [facility])   ? rectal, Lung  ? Dyspnea   ? with exertion  ? Emphysema of lung ([place])   ? patient denies  ? History of kidney stones   ? passed  ? HLD (hyperlipidemia)   ? Pneumonia 02/2018  ? Rectal tumor   ? cancer  ?  Seasonal allergies   ?  ? ?Family History  ?Problem Relation Age of Onset  ? Heart disease Mother   ? COPD Mother   ? Emphysema Mother   ? Diabetes Father   ? Colon cancer Neg Hx   ? Colon polyps Neg Hx   ? Esophageal cancer Neg Hx   ? Rectal cancer Neg Hx   ? Stomach cancer Neg Hx   ?  ? ?Past Surgical History:  ?Procedure Laterality Date  ? ABLATION    ? endometrial  ? BIOPSY  10/10/2018  ? Procedure: BIOPSY;  Surgeon: Irving Copas., MD;  Location: [place];  Service: Gastroenterology;;  ? BIOPSY  06/12/2019  ? Procedure: BIOPSY;  Surgeon: Irving Copas., MD;  Location: [place];  Service: Gastroenterology;;  ? BIOPSY  09/30/2020  ? Procedure: BIOPSY;  Surgeon: Irving Copas., MD;  Location: [facility];  Service: Gastroenterology;;  ? COLONOSCOPY    ? DOPPLER ECHOCARDIOGRAPHY    ? ENDOSCOPIC MUCOSAL RESECTION N/A 10/10/2018  ? Procedure: ENDOSCOPIC MUCOSAL RESECTION;  Surgeon: Rush Landmark Telford Nab., MD;  Location: [place];  Service: Gastroenterology;  Laterality: N/A;  ? EUS N/A 07/18/2018  ? Procedure: LOWER ENDOSCOPIC ULTRASOUND (EUS);  Surgeon: Irving Copas., MD;  Location: [place];  Service: Gastroenterology;  Laterality: N/A;  ? EUS N/A 10/10/2018  ? Procedure: LOWER ENDOSCOPIC ULTRASOUND (EUS);  Surgeon: [name]  Brooke Bonito., MD;  Location: [place];  Service: Gastroenterology;  Laterality: N/A;  ? EUS N/A 06/12/2019  ? Procedure: LOWER ENDOSCOPIC ULTRASOUND (EUS);  Surgeon: Irving Copas., MD;  Location: [place];  Service: Gastroenterology;  Laterality: N/A;  ? EUS N/A 09/30/2020  ? Procedure: LOWER ENDOSCOPIC ULTRASOUND (EUS);  Surgeon: Irving Copas., MD;  Location: [facility];  Service: Gastroenterology;  Laterality: N/A;  ? FLEXIBLE SIGMOIDOSCOPY N/A 07/18/2018  ? Procedure: FLEXIBLE SIGMOIDOSCOPY;  Surgeon: Irving Copas., MD;  Location: [place];  Service: Gastroenterology;  Laterality: N/A;  ? FLEXIBLE  SIGMOIDOSCOPY N/A 10/10/2018  ? Procedure: FLEXIBLE SIGMOIDOSCOPY;  Surgeon: Irving Copas., MD;  Location: [place];  Service: Gastroenterology;  Laterality: N/A;  ? FLEXIBLE SIGMOIDOSCOPY N/A 06/12/2019  ? Procedure: FLEXIBLE SIGMOIDOSCOPY;  Surgeon: Irving Copas., MD;  Location: [place];  Service: Gastroenterology;  Laterality: N/A;  ? FLEXIBLE SIGMOIDOSCOPY N/A 09/30/2020  ? Procedure: FLEXIBLE SIGMOIDOSCOPY;  Surgeon: Irving Copas., MD;  Location: [facility];  Service: Gastroenterology;  Laterality: N/A;  ? HEMOSTASIS CLIP PLACEMENT  10/10/2018  ? Procedure: HEMOSTASIS CLIP PLACEMENT;  Surgeon: Irving Copas., MD;  Location: [place];  Service: Gastroenterology;;  ? LUNG CANCER SURGERY  12/30/2018  ? right lun- mid lobe , dr gerhardt  ? SUBMUCOSAL LIFTING INJECTION  10/10/2018  ? Procedure: SUBMUCOSAL LIFTING INJECTION;  Surgeon: Irving Copas., MD;  Location: [place];  Service: Gastroenterology;;  ? TEE WITHOUT CARDIOVERSION  02/2018  ? TONSILLECTOMY    ? TUBAL LIGATION  1987  ? VIDEO ASSISTED THORACOSCOPY (VATS)/WEDGE RESECTION Right 12/30/2018  ? Procedure: VIDEO ASSISTED THORACOSCOPY (VATS), MINI THORACOTOMY, RIGHT MIDDLE LOBECTOMY WITH LYMPH NODE DISSECTION;  Surgeon: Grace Isaac, MD;  Location: [place];  Service: Thoracic;  Laterality: Right;  ? VIDEO BRONCHOSCOPY N/A 10/12/2018  ? Procedure: VIDEO BRONCHOSCOPY;  Surgeon: Grace Isaac, MD;  Location: [place];  Service: Thoracic;  Laterality: N/A;  ? VIDEO BRONCHOSCOPY N/A 12/30/2018  ? Procedure: VIDEO BRONCHOSCOPY;  Surgeon: Grace Isaac, MD;  Location: [place];  Service: Thoracic;  Laterality: N/A;  ? WISDOM TOOTH EXTRACTION    ? ? ?Social History  ? ?Socioeconomic History  ? Marital status: Married  ?  Spouse name: Not on file  ? Number of children: Not on file  ? Years of education: Not on file  ? Highest education level: Not on file  ?Occupational History  ? Not on file  ?Tobacco  Use  ? Smoking status: Former  ?  Years: 33.00  ?  Types: Cigarettes  ?  Quit date: 2008  ?  Years since quitting: 15.2  ? Smokeless tobacco: Never  ?Vaping Use  ? Vaping Use: Never used  ?Substance and Sexual Activity  ? Alcohol use: Never  ?  Comment: rare  ? Drug use: Never  ? Sexual activity: Not Currently  ?  Birth control/protection: Post-menopausal  ?Other Topics Concern  ? Not on file  ?Social History Narrative  ? Not on file  ? ?Social Determinants of Health  ? ?Financial Resource Strain: Not on file  ?Food Insecurity: Not on file  ?Transportation Needs: Not on file  ?Physical Activity: Not on file  ?Stress: Not on file  ?Social Connections: Not on file  ?Intimate Partner Violence: Not on file  ?  ? ?Allergies  ?Allergen Reactions  ? Hydrocodone Nausea Only  ?  "terrible headache"  ? Pravastatin Other (See Comments)  ? Other   ?  UNSPECIFIED REACTION  ?Dog  Dander   ? Levaquin [Levofloxacin In D5w] Nausea And Vomiting  ?   ?  ?  ? ?Outpatient Medications Prior to Visit  ?Medication Sig Dispense Refill  ? acetaminophen (TYLENOL) 325 MG tablet Take 650 mg by mouth every 6 (six) hours as needed for moderate pain.    ? albuterol (PROVENTIL HFA;VENTOLIN HFA) 108 (90 Base) MCG/ACT inhaler Inhale 2 puffs into the lungs every 6 (six) hours as needed for wheezing or shortness of breath.    ? Alirocumab (PRALUENT) 75 MG/ML SOAJ INJECT CONTENTS OF 1 PEN EVERY 2 WEEKS 2 mL 1  ? busPIRone (BUSPAR) 10 MG tablet TAKE HALF TABLET BY MOUTH TWICE DAILY 90 tablet 1  ? calcium carbonate (TUMS - DOSED IN MG ELEMENTAL CALCIUM) 500 MG chewable tablet Chew 1 tablet by mouth daily as needed for indigestion or heartburn.    ? Cholecalciferol (VITAMIN D-3) 125 MCG (5000 UT) TABS Take 5,000 Units by mouth every evening.    ? Cyanocobalamin (B-12) 2500 MCG TABS Take 2,500 mcg by mouth daily.    ? lisinopril (ZESTRIL) 10 MG tablet TAKE 1 TABLET(10 MG) BY MOUTH DAILY 30 tablet 2  ? LORazepam (ATIVAN) 1 MG tablet Take 1 tablet (1 mg total)  by mouth at bedtime. 30 tablet 1  ? meloxicam (MOBIC) 7.5 MG tablet Take 1 tablet (7.5 mg total) by mouth 2 (two) times daily as needed. for pain 60 tablet 5  ? Multiple Vitamin (MULTIVITAMIN WITH MINERAL

## 2021-10-16 NOTE — Patient Instructions (Signed)
?  Emphysema ?Overall asymptomatic ?No indication for bronchodilators ? ?Peri-operative Assessment of Pulmonary Risk for Non-Thoracic Surgery: ? ?For Ms. Shannon [name], risk of perioperative pulmonary complications is increased by: ? [   ] Age greater than 4 years ? [X] COPD ? [   ]Serum albumin <3.5 ? [   ]Smoking ? [   ]Obstructive sleep apnea ? [   ]NYHA Class II Pulmonary Hypertension ? ?Respiratory complications generally occur in 1% of ASA Class I patients, 5% of ASA Class II and 10% of ASA Class III-IV patients These complications rarely result in mortality and include postoperative pneumonia, atelectasis, pulmonary embolism, ARDS and increased time requiring postoperative mechanical ventilation. ? ?Overall, I recommend proceeding with the surgery if the risk for respiratory complications are outweighed by the potential benefits. This will need to be discussed between the patient and surgeon. ? ?To reduce risks of respiratory complications, I recommend: ?--Pre- and [name]-operative incentive spirometry performed frequently while awake ?--Avoiding use of pancuronium during anesthesia. ? ?I have discussed the risk factors and recommendations above with the patient. ? ?Follow-up with me as needed ?

## 2021-10-17 NOTE — Telephone Encounter (Signed)
Patient seen yesterday by Dr. Loanne Drilling  ?

## 2021-10-20 ENCOUNTER — Encounter: Payer: Self-pay | Admitting: Pulmonary Disease

## 2021-10-20 DIAGNOSIS — J432 Centrilobular emphysema: Secondary | ICD-10-CM | POA: Insufficient documentation

## 2021-10-20 NOTE — Telephone Encounter (Signed)
OV notes and clearance form have been faxed back to [facility]. Nothing further needed at this time.  °

## 2021-10-22 ENCOUNTER — Ambulatory Visit: Payer: BC Managed Care – PPO | Admitting: Physician Assistant

## 2021-10-22 ENCOUNTER — Encounter: Payer: Self-pay | Admitting: Physician Assistant

## 2021-10-22 VITALS — BP 138/88 | HR 76 | Temp 97.8°F | Ht 65.0 in | Wt 183.8 lb

## 2021-10-22 DIAGNOSIS — M25552 Pain in left hip: Secondary | ICD-10-CM | POA: Diagnosis not present

## 2021-10-22 DIAGNOSIS — I10 Essential (primary) hypertension: Secondary | ICD-10-CM

## 2021-10-22 DIAGNOSIS — Z01810 Encounter for preprocedural cardiovascular examination: Secondary | ICD-10-CM | POA: Diagnosis not present

## 2021-10-22 MED ORDER — LISINOPRIL 20 MG PO TABS: 20.0000 mg | ORAL_TABLET | Freq: Every day | ORAL | 3 refills | Status: DC

## 2021-10-22 NOTE — Progress Notes (Signed)
? ?Subjective:  ?Patient ID: Shannon [name], female    DOB: 1961/04/20  Age: 61 y.o. MRN: [id number] ? ?Chief Complaint  ?Patient presents with  ? Hypertension  ? ? ?HPI ? Pt with history of hypertension - has been elevated at home when she is checking ?Denies chest pain/sob - is currently on lisinopril 10mg  qd ? ?Pt here for preop checkup - will be having a total left hip replacement with Dr Noemi Chapel ?They have requested cardiac and pulmonology records as well ?Current Outpatient Medications on File Prior to Visit  ?Medication Sig Dispense Refill  ? acetaminophen (TYLENOL) 325 MG tablet Take 650 mg by mouth every 6 (six) hours as needed for moderate pain.    ? albuterol (PROVENTIL HFA;VENTOLIN HFA) 108 (90 Base) MCG/ACT inhaler Inhale 2 puffs into the lungs every 6 (six) hours as needed for wheezing or shortness of breath.    ? Alirocumab (PRALUENT) 75 MG/ML SOAJ INJECT CONTENTS OF 1 PEN EVERY 2 WEEKS 2 mL 1  ? busPIRone (BUSPAR) 10 MG tablet TAKE HALF TABLET BY MOUTH TWICE DAILY 90 tablet 1  ? calcium carbonate (TUMS - DOSED IN MG ELEMENTAL CALCIUM) 500 MG chewable tablet Chew 1 tablet by mouth daily as needed for indigestion or heartburn.    ? Cholecalciferol (VITAMIN D-3) 125 MCG (5000 UT) TABS Take 5,000 Units by mouth every evening.    ? Cyanocobalamin (B-12) 2500 MCG TABS Take 2,500 mcg by mouth daily.    ? LORazepam (ATIVAN) 1 MG tablet Take 1 tablet (1 mg total) by mouth at bedtime. 30 tablet 1  ? meloxicam (MOBIC) 7.5 MG tablet Take 1 tablet (7.5 mg total) by mouth 2 (two) times daily as needed. for pain 60 tablet 5  ? Multiple Vitamin (MULTIVITAMIN WITH MINERALS) TABS tablet Take 1 tablet by mouth daily.    ? ?No current facility-administered medications on file prior to visit.  ? ?Past Medical History:  ?Diagnosis Date  ? Anxiety   ? Arthritis   ? Asthma   ? Cancer [facility])   ? rectal, Lung  ? Dyspnea   ? with exertion  ? Emphysema of lung ([place])   ? patient denies  ? History of kidney stones   ? passed  ?  HLD (hyperlipidemia)   ? Pneumonia 02/2018  ? Rectal tumor   ? cancer  ? Seasonal allergies   ? ?Past Surgical History:  ?Procedure Laterality Date  ? ABLATION    ? endometrial  ? BIOPSY  10/10/2018  ? Procedure: BIOPSY;  Surgeon: Irving Copas., MD;  Location: [place];  Service: Gastroenterology;;  ? BIOPSY  06/12/2019  ? Procedure: BIOPSY;  Surgeon: Irving Copas., MD;  Location: [place];  Service: Gastroenterology;;  ? BIOPSY  09/30/2020  ? Procedure: BIOPSY;  Surgeon: Irving Copas., MD;  Location: [facility];  Service: Gastroenterology;;  ? COLONOSCOPY    ? DOPPLER ECHOCARDIOGRAPHY    ? ENDOSCOPIC MUCOSAL RESECTION N/A 10/10/2018  ? Procedure: ENDOSCOPIC MUCOSAL RESECTION;  Surgeon: Rush Landmark Telford Nab., MD;  Location: [place];  Service: Gastroenterology;  Laterality: N/A;  ? EUS N/A 07/18/2018  ? Procedure: LOWER ENDOSCOPIC ULTRASOUND (EUS);  Surgeon: Irving Copas., MD;  Location: [place];  Service: Gastroenterology;  Laterality: N/A;  ? EUS N/A 10/10/2018  ? Procedure: LOWER ENDOSCOPIC ULTRASOUND (EUS);  Surgeon: Irving Copas., MD;  Location: [place];  Service: Gastroenterology;  Laterality: N/A;  ? EUS N/A 06/12/2019  ? Procedure: LOWER ENDOSCOPIC ULTRASOUND (EUS);  Surgeon:  Mansouraty, Telford Nab., MD;  Location: [place];  Service: Gastroenterology;  Laterality: N/A;  ? EUS N/A 09/30/2020  ? Procedure: LOWER ENDOSCOPIC ULTRASOUND (EUS);  Surgeon: Irving Copas., MD;  Location: [facility];  Service: Gastroenterology;  Laterality: N/A;  ? FLEXIBLE SIGMOIDOSCOPY N/A 07/18/2018  ? Procedure: FLEXIBLE SIGMOIDOSCOPY;  Surgeon: Irving Copas., MD;  Location: [place];  Service: Gastroenterology;  Laterality: N/A;  ? FLEXIBLE SIGMOIDOSCOPY N/A 10/10/2018  ? Procedure: FLEXIBLE SIGMOIDOSCOPY;  Surgeon: Irving Copas., MD;  Location: [place];  Service: Gastroenterology;  Laterality: N/A;  ? FLEXIBLE  SIGMOIDOSCOPY N/A 06/12/2019  ? Procedure: FLEXIBLE SIGMOIDOSCOPY;  Surgeon: Irving Copas., MD;  Location: [place];  Service: Gastroenterology;  Laterality: N/A;  ? FLEXIBLE SIGMOIDOSCOPY N/A 09/30/2020  ? Procedure: FLEXIBLE SIGMOIDOSCOPY;  Surgeon: Irving Copas., MD;  Location: [facility];  Service: Gastroenterology;  Laterality: N/A;  ? HEMOSTASIS CLIP PLACEMENT  10/10/2018  ? Procedure: HEMOSTASIS CLIP PLACEMENT;  Surgeon: Irving Copas., MD;  Location: [place];  Service: Gastroenterology;;  ? LUNG CANCER SURGERY  12/30/2018  ? right lun- mid lobe , dr gerhardt  ? SUBMUCOSAL LIFTING INJECTION  10/10/2018  ? Procedure: SUBMUCOSAL LIFTING INJECTION;  Surgeon: Irving Copas., MD;  Location: [place];  Service: Gastroenterology;;  ? TEE WITHOUT CARDIOVERSION  02/2018  ? TONSILLECTOMY    ? TUBAL LIGATION  1987  ? VIDEO ASSISTED THORACOSCOPY (VATS)/WEDGE RESECTION Right 12/30/2018  ? Procedure: VIDEO ASSISTED THORACOSCOPY (VATS), MINI THORACOTOMY, RIGHT MIDDLE LOBECTOMY WITH LYMPH NODE DISSECTION;  Surgeon: Grace Isaac, MD;  Location: [place];  Service: Thoracic;  Laterality: Right;  ? VIDEO BRONCHOSCOPY N/A 10/12/2018  ? Procedure: VIDEO BRONCHOSCOPY;  Surgeon: Grace Isaac, MD;  Location: [place];  Service: Thoracic;  Laterality: N/A;  ? VIDEO BRONCHOSCOPY N/A 12/30/2018  ? Procedure: VIDEO BRONCHOSCOPY;  Surgeon: Grace Isaac, MD;  Location: [place];  Service: Thoracic;  Laterality: N/A;  ? WISDOM TOOTH EXTRACTION    ?  ?Family History  ?Problem Relation Age of Onset  ? Heart disease Mother   ? COPD Mother   ? Emphysema Mother   ? Diabetes Father   ? Colon cancer Neg Hx   ? Colon polyps Neg Hx   ? Esophageal cancer Neg Hx   ? Rectal cancer Neg Hx   ? Stomach cancer Neg Hx   ? ?Social History  ? ?Socioeconomic History  ? Marital status: Married  ?  Spouse name: Not on file  ? Number of children: Not on file  ? Years of education: Not on file  ? Highest  education level: Not on file  ?Occupational History  ? Not on file  ?Tobacco Use  ? Smoking status: Former  ?  Years: 33.00  ?  Types: Cigarettes  ?  Quit date: 2008  ?  Years since quitting: 15.2  ? Smokeless tobacco: Never  ?Vaping Use  ? Vaping Use: Never used  ?Substance and Sexual Activity  ? Alcohol use: Never  ?  Comment: rare  ? Drug use: Never  ? Sexual activity: Not Currently  ?  Birth control/protection: Post-menopausal  ?Other Topics Concern  ? Not on file  ?Social History Narrative  ? Not on file  ? ?Social Determinants of Health  ? ?Financial Resource Strain: Not on file  ?Food Insecurity: Not on file  ?Transportation Needs: Not on file  ?Physical Activity: Not on file  ?Stress: Not on file  ?Social Connections: Not on file  ? ? ?  Review of Systems ?CONSTITUTIONAL: Negative for chills, fatigue, fever, unintentional weight gain and unintentional weight loss.  ?CARDIOVASCULAR: Negative for chest pain, dizziness, palpitations and pedal edema.  ?RESPIRATORY: Negative for recent cough and dyspnea.  ?GASTROINTESTINAL: Negative for abdominal pain, acid reflux symptoms, constipation, diarrhea, nausea and vomiting.  ?MSK: see HPI  ?INTEGUMENTARY: Negative for rash.  ? ? ?Objective:  ?BP 138/88   Pulse 76   Temp 97.8 ?F (36.6 ?C)   Ht 5\' 5"  (1.651 m)   Wt 183 lb 12.8 oz (83.4 kg)   LMP  (LMP Unknown)   SpO2 98%   BMI 30.59 kg/m?  ? ? ?  10/22/2021  ?  3:03 PM 10/16/2021  ?  2:52 PM 10/02/2021  ?  2:20 PM  ?BP/Weight  ?Systolic BP 291 916 606  ?Diastolic BP 88 94 78  ?Wt. (Lbs) 183.8 185   ?BMI 30.59 kg/m2 30.79 kg/m2   ? ? ?Physical Exam ?PHYSICAL EXAM:  ? ?VS: BP 138/88   Pulse 76   Temp 97.8 ?F (36.6 ?C)   Ht 5\' 5"  (1.651 m)   Wt 183 lb 12.8 oz (83.4 kg)   LMP  (LMP Unknown)   SpO2 98%   BMI 30.59 kg/m?  ? ?GEN: Well nourished, well developed, in no acute distress  ?HEENT: normal external ears and nose - normal external auditory canals and TMS -  - Lips, Teeth and Gums - normal  ?Oropharynx - normal  mucosa, palate, and posterior pharynx ? ?Cardiac: RRR; no murmurs, rubs, or gallops,no edema -  ?Respiratory:  normal respiratory rate and pattern with no distress - normal breath sounds with no rales, rhonchi, wheezes or rubs ? ?

## 2021-10-23 LAB — CBC WITH DIFFERENTIAL/PLATELET
Basophils Absolute: 0 10*3/uL (ref 0.0–0.2)
Basos: 1 %
EOS (ABSOLUTE): 0.5 10*3/uL — ABNORMAL HIGH (ref 0.0–0.4)
Eos: 6 %
Hematocrit: 43.4 % (ref 34.0–46.6)
Hemoglobin: 14.1 g/dL (ref 11.1–15.9)
Immature Grans (Abs): 0 10*3/uL (ref 0.0–0.1)
Immature Granulocytes: 0 %
Lymphocytes Absolute: 2.1 10*3/uL (ref 0.7–3.1)
Lymphs: 27 %
MCH: 27.9 pg (ref 26.6–33.0)
MCHC: 32.5 g/dL (ref 31.5–35.7)
MCV: 86 fL (ref 79–97)
Monocytes Absolute: 0.7 10*3/uL (ref 0.1–0.9)
Monocytes: 8 %
Neutrophils Absolute: 4.5 10*3/uL (ref 1.4–7.0)
Neutrophils: 58 %
Platelets: 269 10*3/uL (ref 150–450)
RBC: 5.05 x10E6/uL (ref 3.77–5.28)
RDW: 13.1 % (ref 11.7–15.4)
WBC: 7.8 10*3/uL (ref 3.4–10.8)

## 2021-10-23 LAB — COMPREHENSIVE METABOLIC PANEL
ALT: 15 IU/L (ref 0–32)
AST: 13 IU/L (ref 0–40)
Albumin/Globulin Ratio: 2.6 — ABNORMAL HIGH (ref 1.2–2.2)
Albumin: 4.6 g/dL (ref 3.8–4.9)
Alkaline Phosphatase: 77 IU/L (ref 44–121)
BUN/Creatinine Ratio: 28 (ref 12–28)
BUN: 21 mg/dL (ref 8–27)
Bilirubin Total: 0.3 mg/dL (ref 0.0–1.2)
CO2: 26 mmol/L (ref 20–29)
Calcium: 10.1 mg/dL (ref 8.7–10.3)
Chloride: 104 mmol/L (ref 96–106)
Creatinine, Ser: 0.76 mg/dL (ref 0.57–1.00)
Globulin, Total: 1.8 g/dL (ref 1.5–4.5)
Glucose: 90 mg/dL (ref 70–99)
Potassium: 4.9 mmol/L (ref 3.5–5.2)
Sodium: 141 mmol/L (ref 134–144)
Total Protein: 6.4 g/dL (ref 6.0–8.5)
eGFR: 90 mL/min/{1.73_m2} (ref >59)

## 2021-10-23 LAB — HEMOGLOBIN A1C
Est. average glucose Bld gHb Est-mCnc: 111 mg/dL
Hgb A1c MFr Bld: 5.5 % (ref 4.8–5.6)

## 2021-10-30 ENCOUNTER — Other Ambulatory Visit: Payer: Self-pay | Admitting: Thoracic Surgery (Cardiothoracic Vascular Surgery)

## 2021-10-30 DIAGNOSIS — C7A090 Malignant carcinoid tumor of the bronchus and lung: Secondary | ICD-10-CM

## 2021-11-05 ENCOUNTER — Other Ambulatory Visit: Payer: Self-pay | Admitting: Physician Assistant

## 2021-11-05 DIAGNOSIS — E782 Mixed hyperlipidemia: Secondary | ICD-10-CM

## 2021-11-08 ENCOUNTER — Other Ambulatory Visit: Payer: Self-pay | Admitting: Physician Assistant

## 2021-11-08 DIAGNOSIS — F419 Anxiety disorder, unspecified: Secondary | ICD-10-CM

## 2021-11-10 ENCOUNTER — Other Ambulatory Visit: Payer: Self-pay | Admitting: Physician Assistant

## 2021-11-10 MED ORDER — BUSPIRONE HCL 10 MG PO TABS: ORAL_TABLET | ORAL | 1 refills | Status: DC

## 2021-11-17 ENCOUNTER — Institutional Professional Consult (permissible substitution): Payer: BC Managed Care – PPO | Admitting: Pulmonary Disease

## 2021-11-19 ENCOUNTER — Ambulatory Visit: Payer: BC Managed Care – PPO | Admitting: Physician Assistant

## 2021-11-19 ENCOUNTER — Encounter: Payer: Self-pay | Admitting: Physician Assistant

## 2021-11-19 VITALS — BP 126/84 | HR 71 | Temp 97.8°F | Ht 65.0 in | Wt 180.8 lb

## 2021-11-19 DIAGNOSIS — E782 Mixed hyperlipidemia: Secondary | ICD-10-CM | POA: Diagnosis not present

## 2021-11-19 DIAGNOSIS — E559 Vitamin D deficiency, unspecified: Secondary | ICD-10-CM

## 2021-11-19 DIAGNOSIS — N3001 Acute cystitis with hematuria: Secondary | ICD-10-CM

## 2021-11-19 DIAGNOSIS — I10 Essential (primary) hypertension: Secondary | ICD-10-CM

## 2021-11-19 DIAGNOSIS — F419 Anxiety disorder, unspecified: Secondary | ICD-10-CM

## 2021-11-19 LAB — POCT URINALYSIS DIP (CLINITEK)
Bilirubin, UA: NEGATIVE
Glucose, UA: NEGATIVE mg/dL
Ketones, POC UA: NEGATIVE mg/dL
Leukocytes, UA: NEGATIVE
Nitrite, UA: NEGATIVE
POC PROTEIN,UA: NEGATIVE
Spec Grav, UA: 1.015 (ref 1.010–1.025)
Urobilinogen, UA: 0.2 E.U./dL
pH, UA: 7 (ref 5.0–8.0)

## 2021-11-19 MED ORDER — ALBUTEROL SULFATE HFA 108 (90 BASE) MCG/ACT IN AERS
2.0000 | INHALATION_SPRAY | Freq: Four times a day (QID) | RESPIRATORY_TRACT | 2 refills | Status: DC | PRN
Start: 2021-11-19 — End: 2022-02-09

## 2021-11-19 MED ORDER — NITROFURANTOIN MONOHYD MACRO 100 MG PO CAPS: 100.0000 mg | ORAL_CAPSULE | Freq: Two times a day (BID) | ORAL | 0 refills | Status: AC

## 2021-11-19 MED ORDER — LORAZEPAM 1 MG PO TABS: 1.0000 mg | ORAL_TABLET | Freq: Every day | ORAL | 1 refills | Status: DC

## 2021-11-19 NOTE — Progress Notes (Signed)
? ?Subjective:  ?Patient ID: Shannon [name], female    DOB: 06/12/1961  Age: 61 y.o. MRN: [id number] ? ?Chief Complaint  ?Patient presents with  ?   ? Hypertension  ? ? ?HPI ? Pt presents for follow up of hypertension.  The patient is tolerating the medication well without side effects. Compliance with treatment has been good; including taking medication as directed , maintains a healthy diet and regular exercise regimen , and following up as directed. She has been monitoring at home and has been ranging 120-130/60-70s ? ?Mixed hyperlipidemia  ?Pt presents with hyperlipidemia. Compliance with treatment has been good The patient is compliant with medications, maintains a low cholesterol diet , follows up as directed , and maintains an exercise regimen . The patient denies experiencing any hypercholesterolemia related symptoms.  She is currently on praluent ? ?Pt is scheduled for hip replacement next week with Dr Percell Miller - states she is currently taking gabapentin, flexeril and meloxicam ? ?Pt with history of anxiety - stable on ativan and buspar - requests refill of ativan ? ?Pt with history of emphysema - states she is not having any trouble with her breathing - denies cough/congestion - requests refill of albuterol inhaler ? ?Pt complains of urine urgency and pressure over the past few days - denies hematuria or nocturia ?No fever or back pain ? ? ? ?Current Outpatient Medications on File Prior to Visit  ?Medication Sig Dispense Refill  ? acetaminophen (TYLENOL) 325 MG tablet Take 650 mg by mouth every 6 (six) hours as needed for moderate pain.    ? busPIRone (BUSPAR) 10 MG tablet TAKE HALF TABLET BY MOUTH TWICE DAILY 60 tablet 1  ? calcium carbonate (TUMS - DOSED IN MG ELEMENTAL CALCIUM) 500 MG chewable tablet Chew 1 tablet by mouth daily as needed for indigestion or heartburn.    ? Cholecalciferol (VITAMIN D-3) 125 MCG (5000 UT) TABS Take 5,000 Units by mouth every evening.    ? Cyanocobalamin (B-12) 2500 MCG  TABS Take 2,500 mcg by mouth daily.    ? cyclobenzaprine (FLEXERIL) 10 MG tablet Take 10 mg by mouth 3 (three) times daily as needed.    ? gabapentin (NEURONTIN) 300 MG capsule Take 300 mg by mouth daily as needed.    ? lisinopril (ZESTRIL) 20 MG tablet Take 1 tablet (20 mg total) by mouth daily. 90 tablet 3  ? meloxicam (MOBIC) 7.5 MG tablet TAKE 1 TABLET(7.5 MG) BY MOUTH TWICE DAILY AS NEEDED FOR PAIN 60 tablet 5  ? PRALUENT 75 MG/ML SOAJ INJECT CONTENTS OF 1 PEN EVERY 2 WEEKS 2 mL 1  ? ?No current facility-administered medications on file prior to visit.  ? ?Past Medical History:  ?Diagnosis Date  ? Anxiety   ? Arthritis   ? Asthma   ? Cancer [facility])   ? rectal, Lung  ? Dyspnea   ? with exertion  ? Emphysema of lung ([place])   ? patient denies  ? History of kidney stones   ? passed  ? HLD (hyperlipidemia)   ? Pneumonia 02/2018  ? Rectal tumor   ? cancer  ? Seasonal allergies   ? ?Past Surgical History:  ?Procedure Laterality Date  ? ABLATION    ? endometrial  ? BIOPSY  10/10/2018  ? Procedure: BIOPSY;  Surgeon: Irving Copas., MD;  Location: [place];  Service: Gastroenterology;;  ? BIOPSY  06/12/2019  ? Procedure: BIOPSY;  Surgeon: Irving Copas., MD;  Location: [place];  Service: Gastroenterology;;  ?  BIOPSY  09/30/2020  ? Procedure: BIOPSY;  Surgeon: Irving Copas., MD;  Location: [facility];  Service: Gastroenterology;;  ? COLONOSCOPY    ? DOPPLER ECHOCARDIOGRAPHY    ? ENDOSCOPIC MUCOSAL RESECTION N/A 10/10/2018  ? Procedure: ENDOSCOPIC MUCOSAL RESECTION;  Surgeon: Rush Landmark Telford Nab., MD;  Location: [place];  Service: Gastroenterology;  Laterality: N/A;  ? EUS N/A 07/18/2018  ? Procedure: LOWER ENDOSCOPIC ULTRASOUND (EUS);  Surgeon: Irving Copas., MD;  Location: [place];  Service: Gastroenterology;  Laterality: N/A;  ? EUS N/A 10/10/2018  ? Procedure: LOWER ENDOSCOPIC ULTRASOUND (EUS);  Surgeon: Irving Copas., MD;  Location: [place];   Service: Gastroenterology;  Laterality: N/A;  ? EUS N/A 06/12/2019  ? Procedure: LOWER ENDOSCOPIC ULTRASOUND (EUS);  Surgeon: Irving Copas., MD;  Location: [place];  Service: Gastroenterology;  Laterality: N/A;  ? EUS N/A 09/30/2020  ? Procedure: LOWER ENDOSCOPIC ULTRASOUND (EUS);  Surgeon: Irving Copas., MD;  Location: [facility];  Service: Gastroenterology;  Laterality: N/A;  ? FLEXIBLE SIGMOIDOSCOPY N/A 07/18/2018  ? Procedure: FLEXIBLE SIGMOIDOSCOPY;  Surgeon: Irving Copas., MD;  Location: [place];  Service: Gastroenterology;  Laterality: N/A;  ? FLEXIBLE SIGMOIDOSCOPY N/A 10/10/2018  ? Procedure: FLEXIBLE SIGMOIDOSCOPY;  Surgeon: Irving Copas., MD;  Location: [place];  Service: Gastroenterology;  Laterality: N/A;  ? FLEXIBLE SIGMOIDOSCOPY N/A 06/12/2019  ? Procedure: FLEXIBLE SIGMOIDOSCOPY;  Surgeon: Irving Copas., MD;  Location: [place];  Service: Gastroenterology;  Laterality: N/A;  ? FLEXIBLE SIGMOIDOSCOPY N/A 09/30/2020  ? Procedure: FLEXIBLE SIGMOIDOSCOPY;  Surgeon: Irving Copas., MD;  Location: [facility];  Service: Gastroenterology;  Laterality: N/A;  ? HEMOSTASIS CLIP PLACEMENT  10/10/2018  ? Procedure: HEMOSTASIS CLIP PLACEMENT;  Surgeon: Irving Copas., MD;  Location: [place];  Service: Gastroenterology;;  ? LUNG CANCER SURGERY  12/30/2018  ? right lun- mid lobe , dr gerhardt  ? SUBMUCOSAL LIFTING INJECTION  10/10/2018  ? Procedure: SUBMUCOSAL LIFTING INJECTION;  Surgeon: Irving Copas., MD;  Location: [place];  Service: Gastroenterology;;  ? TEE WITHOUT CARDIOVERSION  02/2018  ? TONSILLECTOMY    ? TUBAL LIGATION  1987  ? VIDEO ASSISTED THORACOSCOPY (VATS)/WEDGE RESECTION Right 12/30/2018  ? Procedure: VIDEO ASSISTED THORACOSCOPY (VATS), MINI THORACOTOMY, RIGHT MIDDLE LOBECTOMY WITH LYMPH NODE DISSECTION;  Surgeon: Grace Isaac, MD;  Location: [place];  Service: Thoracic;  Laterality: Right;  ?  VIDEO BRONCHOSCOPY N/A 10/12/2018  ? Procedure: VIDEO BRONCHOSCOPY;  Surgeon: Grace Isaac, MD;  Location: [place];  Service: Thoracic;  Laterality: N/A;  ? VIDEO BRONCHOSCOPY N/A 12/30/2018  ? Procedure: VIDEO BRONCHOSCOPY;  Surgeon: Grace Isaac, MD;  Location: [place];  Service: Thoracic;  Laterality: N/A;  ? WISDOM TOOTH EXTRACTION    ?  ?Family History  ?Problem Relation Age of Onset  ? Heart disease Mother   ? COPD Mother   ? Emphysema Mother   ? Diabetes Father   ? Colon cancer Neg Hx   ? Colon polyps Neg Hx   ? Esophageal cancer Neg Hx   ? Rectal cancer Neg Hx   ? Stomach cancer Neg Hx   ? ?Social History  ? ?Socioeconomic History  ? Marital status: Married  ?  Spouse name: Not on file  ? Number of children: Not on file  ? Years of education: Not on file  ? Highest education level: Not on file  ?Occupational History  ? Not on file  ?Tobacco Use  ? Smoking status: Former  ?  Years: 33.00  ?  Types: Cigarettes  ?  Quit date: 2008  ?  Years since quitting: 15.3  ? Smokeless tobacco: Never  ?Vaping Use  ? Vaping Use: Never used  ?Substance and Sexual Activity  ? Alcohol use: Never  ?  Comment: rare  ? Drug use: Never  ? Sexual activity: Not Currently  ?  Birth control/protection: Post-menopausal  ?Other Topics Concern  ? Not on file  ?Social History Narrative  ? Not on file  ? ?Social Determinants of Health  ? ?Financial Resource Strain: Not on file  ?Food Insecurity: Not on file  ?Transportation Needs: Not on file  ?Physical Activity: Not on file  ?Stress: Not on file  ?Social Connections: Not on file  ? ? ?Review of Systems ?CONSTITUTIONAL: Negative for chills, fatigue, fever, unintentional weight gain and unintentional weight loss.  ?E/N/T: Negative for ear pain, nasal congestion and sore throat.  ?CARDIOVASCULAR: Negative for chest pain, dizziness, palpitations and pedal edema.  ?RESPIRATORY: Negative for recent cough and dyspnea.  ?GASTROINTESTINAL: Negative for abdominal pain, acid reflux symptoms,  constipation, diarrhea, nausea and vomiting.  ?GU - see HPI ?MSK: see HPI ?INTEGUMENTARY: Negative for rash.  ?NEUROLOGICAL: Negative for dizziness and headaches.  ?PSYCHIATRIC: Negative for sleep disturbance and

## 2021-11-20 LAB — COMPREHENSIVE METABOLIC PANEL
ALT: 11 IU/L (ref 0–32)
AST: 14 IU/L (ref 0–40)
Albumin/Globulin Ratio: 1.9 (ref 1.2–2.2)
Albumin: 4.3 g/dL (ref 3.8–4.8)
Alkaline Phosphatase: 78 IU/L (ref 44–121)
BUN/Creatinine Ratio: 17 (ref 12–28)
BUN: 15 mg/dL (ref 8–27)
Bilirubin Total: 0.4 mg/dL (ref 0.0–1.2)
CO2: 26 mmol/L (ref 20–29)
Calcium: 10.5 mg/dL — ABNORMAL HIGH (ref 8.7–10.3)
Chloride: 101 mmol/L (ref 96–106)
Creatinine, Ser: 0.89 mg/dL (ref 0.57–1.00)
Globulin, Total: 2.3 g/dL (ref 1.5–4.5)
Glucose: 90 mg/dL (ref 70–99)
Potassium: 5.2 mmol/L (ref 3.5–5.2)
Sodium: 141 mmol/L (ref 134–144)
Total Protein: 6.6 g/dL (ref 6.0–8.5)
eGFR: 74 mL/min/{1.73_m2} (ref >59)

## 2021-11-20 LAB — CBC WITH DIFFERENTIAL/PLATELET
Basophils Absolute: 0 10*3/uL (ref 0.0–0.2)
Basos: 1 %
EOS (ABSOLUTE): 0.3 10*3/uL (ref 0.0–0.4)
Eos: 5 %
Hematocrit: 42.1 % (ref 34.0–46.6)
Hemoglobin: 13.7 g/dL (ref 11.1–15.9)
Immature Grans (Abs): 0 10*3/uL (ref 0.0–0.1)
Immature Granulocytes: 0 %
Lymphocytes Absolute: 1.9 10*3/uL (ref 0.7–3.1)
Lymphs: 31 %
MCH: 28.7 pg (ref 26.6–33.0)
MCHC: 32.5 g/dL (ref 31.5–35.7)
MCV: 88 fL (ref 79–97)
Monocytes Absolute: 0.4 10*3/uL (ref 0.1–0.9)
Monocytes: 6 %
Neutrophils Absolute: 3.6 10*3/uL (ref 1.4–7.0)
Neutrophils: 57 %
Platelets: 272 10*3/uL (ref 150–450)
RBC: 4.77 x10E6/uL (ref 3.77–5.28)
RDW: 12.6 % (ref 11.7–15.4)
WBC: 6.3 10*3/uL (ref 3.4–10.8)

## 2021-11-20 LAB — LIPID PANEL
Chol/HDL Ratio: 2.9 ratio (ref 0.0–4.4)
Cholesterol, Total: 180 mg/dL (ref 100–199)
HDL: 62 mg/dL (ref >39)
LDL Chol Calc (NIH): 92 mg/dL (ref 0–99)
Triglycerides: 152 mg/dL — ABNORMAL HIGH (ref 0–149)
VLDL Cholesterol Cal: 26 mg/dL (ref 5–40)

## 2021-11-20 LAB — CARDIOVASCULAR RISK ASSESSMENT

## 2021-11-20 LAB — VITAMIN D 25 HYDROXY (VIT D DEFICIENCY, FRACTURES): Vit D, 25-Hydroxy: 33.2 ng/mL (ref 30.0–100.0)

## 2021-11-20 LAB — TSH: TSH: 1.44 u[IU]/mL (ref 0.450–4.500)

## 2021-11-20 IMAGING — CT CT CHEST W/O CM
2 of 4 series · 11 of 36 positions shown, 13 images · non-contrast
Comparison: 01/01/2019

CLINICAL DATA: Follow-up right lung nodule history of right middle
lobe lung cancer, post lobectomy in Wednesday December, 2018.

EXAM:
CT CHEST WITHOUT CONTRAST
TECHNIQUE: Multidetector CT imaging of the chest was performed following the
standard protocol without IV contrast.

[Series 2: chest 2.00 br40 s3 · axial · 0.61mm/px · z∈[+1584,+1819]mm · 8 of 140 slices shown, 10 images (1 of 2)]
[im 11/140  mediastinal]
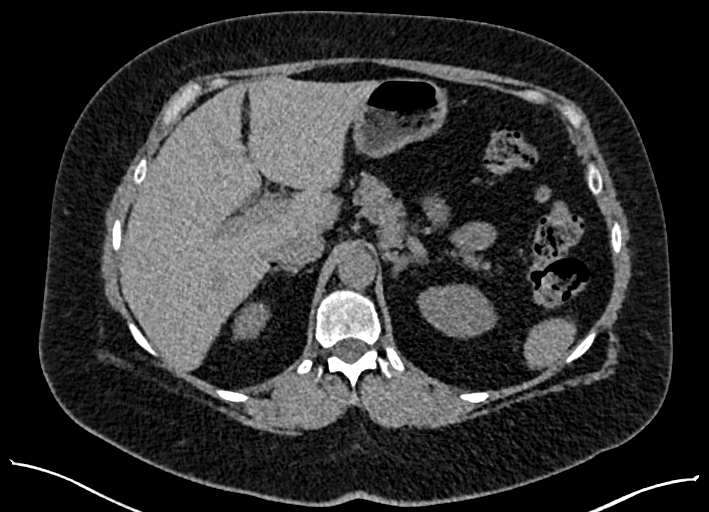
[im 11/140  lung]
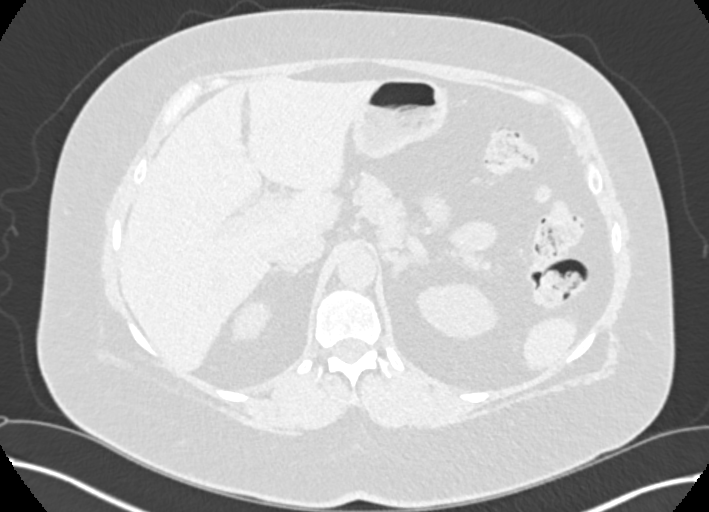
[im 33/140  lung]
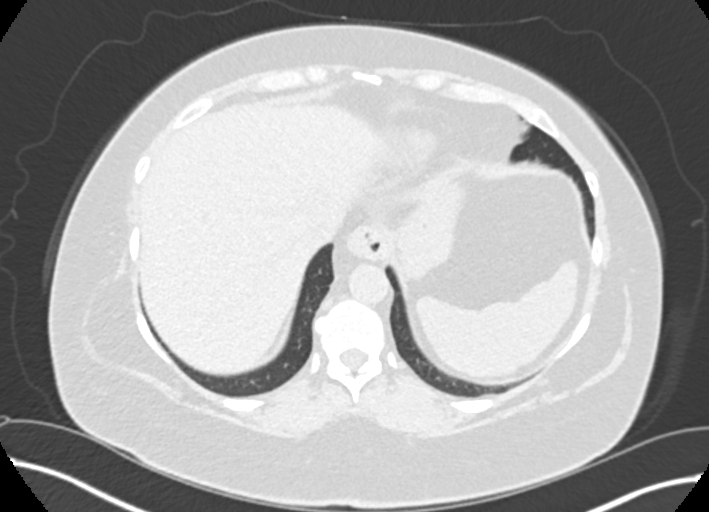
[im 43/140  lung]
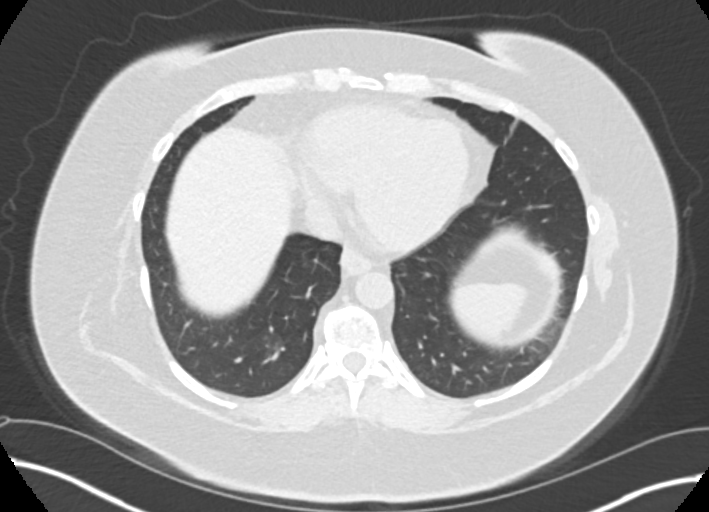
[im 65/140  lung]
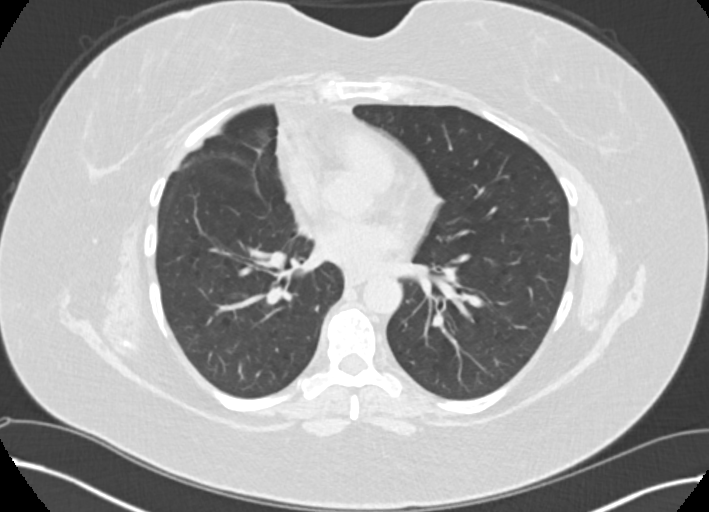
[im 75/140  mediastinal]
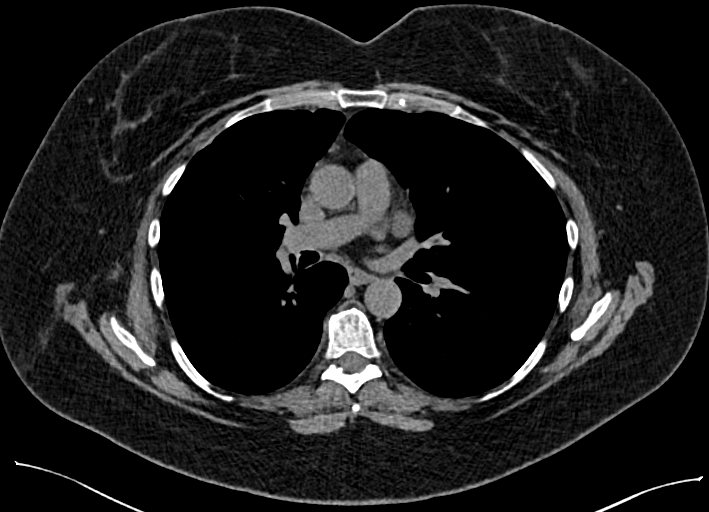
[im 75/140  lung]
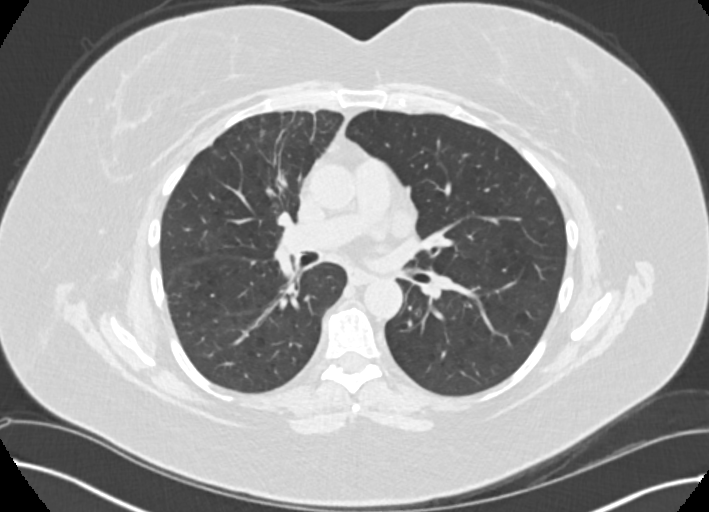
[im 97/140  lung]
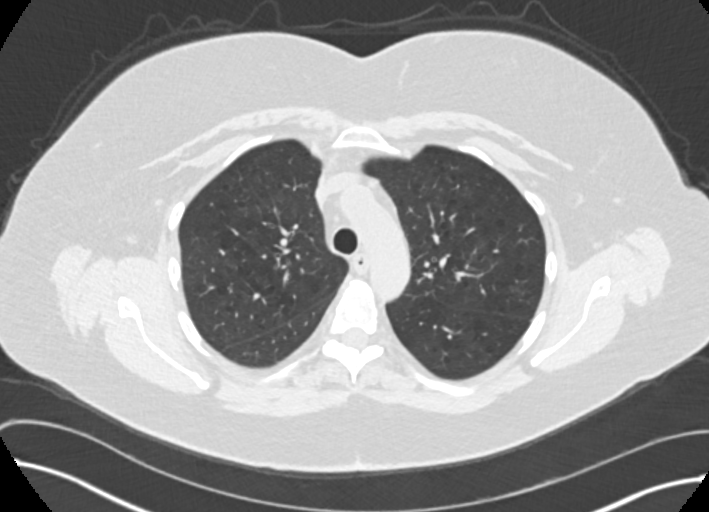
[im 107/140  lung]
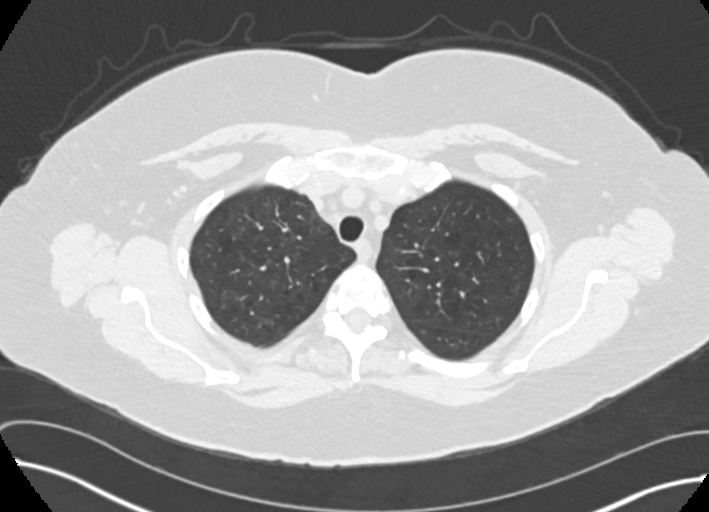
[im 129/140  lung]
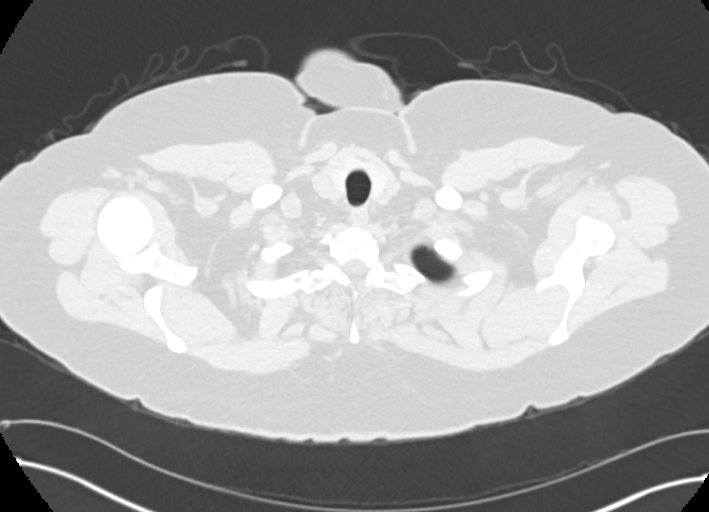

[Series 4: chest 2.00 br40 s3 · coronal · 0.55mm/px · 3 of 155 slices shown (2 of 2)]
[im 31/155  lung]
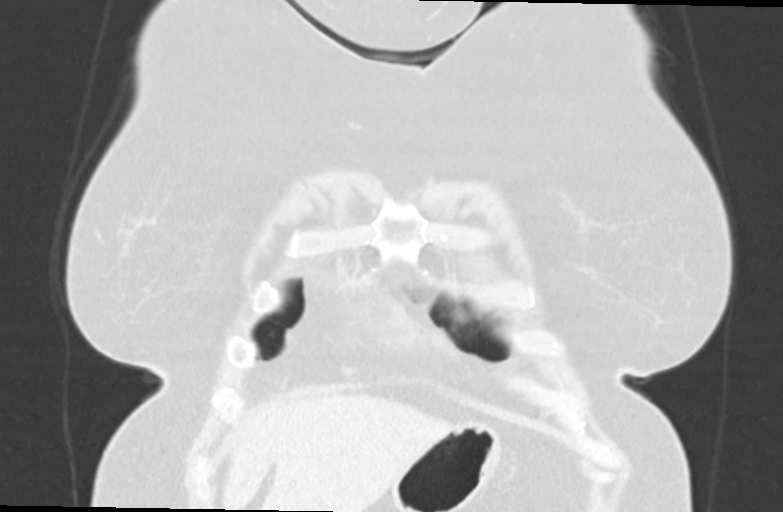
[im 62/155  lung]
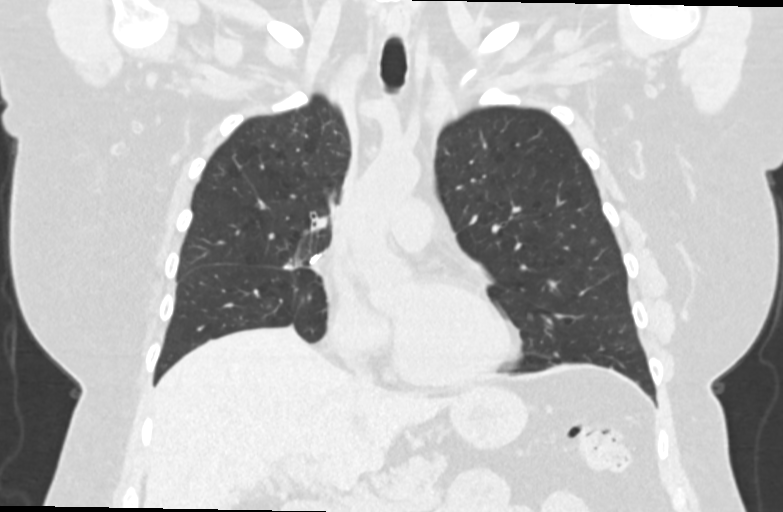
[im 93/155  lung]
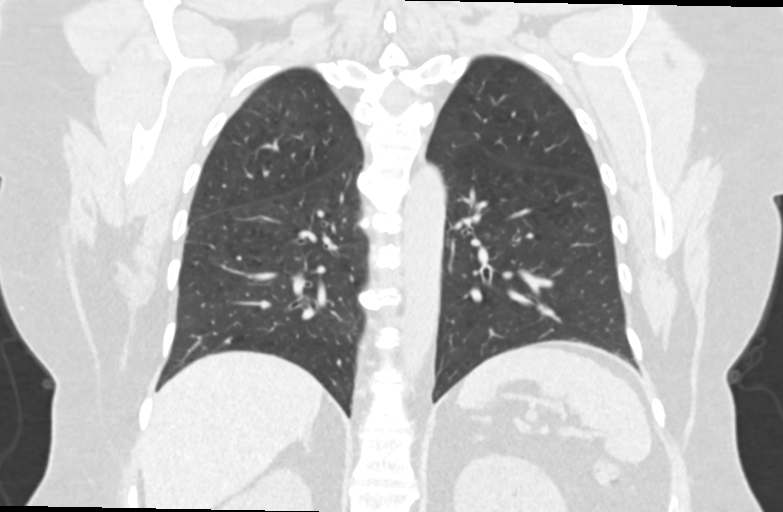

[11 of 36 positions shown; findings below may reference images not displayed]

FINDINGS: Cardiovascular: Minimal scattered atherosclerotic changes. Vessels
not well evaluated given lack of intravenous contrast. No signs of
pericardial effusion. Right hilar distortion following right middle
lobectomy.

Mediastinum/Nodes: Stable AP window lymph node versus small cystic
area (image 65, series 2) this measures 11 mm short axis. No
thoracic inlet adenopathy.

Right hilar distortion from previous partial lung resection.

Lungs/Pleura: Since the previous exam postoperative changes have
nearly completely resolved. Chain sutures extend from the anterior
right hilum toward the anterior chest with signs of presumed
scarring along this band of postoperative change.

Background centrilobular emphysematous changes with similar
appearance.

Small right upper lobe nodule (image 29, series 8) 5 mm unchanged
from previous exam.

New ill-defined area of nodularity in the anterior right lung apex
(image 25, series 8) 3 mm not present on preoperative evaluation.

Upper Abdomen: Incidental imaging of upper abdominal contents is
unremarkable.

Musculoskeletal: No acute bone finding or destructive bone process.
IMPRESSION: 1. New ill-defined area of nodularity in the anterior right lung
apex (image 25, series 8) not present on preoperative evaluation.
This could be related to post operative changes, consider 3 month
follow-up for further assessment.
2. Stable 5 mm right upper lobe pulmonary nodule.
3. Stable small mediastinal lymph node versus small pericardial
cyst, attention on follow-up
4. Stable emphysematous changes.
5. Emphysema and aortic atherosclerosis.

Aortic Atherosclerosis (8MQLF-AHQ.Q) and Emphysema (8MQLF-1U1.8).

## 2021-11-21 LAB — URINE CULTURE

## 2021-11-24 HISTORY — PX: TOTAL HIP ARTHROPLASTY: SHX124

## 2021-12-08 ENCOUNTER — Other Ambulatory Visit: Payer: Self-pay | Admitting: Physician Assistant

## 2021-12-08 DIAGNOSIS — F419 Anxiety disorder, unspecified: Secondary | ICD-10-CM

## 2021-12-12 ENCOUNTER — Telehealth: Payer: BC Managed Care – PPO | Admitting: Thoracic Surgery (Cardiothoracic Vascular Surgery)

## 2021-12-12 ENCOUNTER — Other Ambulatory Visit: Payer: BC Managed Care – PPO

## 2021-12-18 ENCOUNTER — Ambulatory Visit: Payer: BC Managed Care – PPO | Admitting: Physician Assistant

## 2022-01-07 ENCOUNTER — Ambulatory Visit: Payer: BC Managed Care – PPO | Admitting: Pulmonary Disease

## 2022-01-15 ENCOUNTER — Telehealth: Payer: Self-pay | Admitting: Gastroenterology

## 2022-01-28 NOTE — Telephone Encounter (Signed)
The pt has been advised per path letter from 09/2020 she is not due for lower EUS until 3/24.  She will f/u with Dr Servando Snare later this month.

## 2022-02-05 ENCOUNTER — Encounter: Payer: Self-pay | Admitting: Pulmonary Disease

## 2022-02-05 ENCOUNTER — Ambulatory Visit: Payer: BC Managed Care – PPO | Admitting: Pulmonary Disease

## 2022-02-05 VITALS — BP 116/80 | HR 64 | Temp 98.0°F | Ht 65.5 in | Wt 183.4 lb

## 2022-02-05 DIAGNOSIS — J432 Centrilobular emphysema: Secondary | ICD-10-CM | POA: Diagnosis not present

## 2022-02-05 MED ORDER — SPIRIVA RESPIMAT 2.5 MCG/ACT IN AERS: 2.0000 | INHALATION_SPRAY | Freq: Every day | RESPIRATORY_TRACT | 0 refills | Status: DC

## 2022-02-05 MED ORDER — MONTELUKAST SODIUM 10 MG PO TABS: 10.0000 mg | ORAL_TABLET | Freq: Every day | ORAL | 2 refills | Status: DC

## 2022-02-05 NOTE — Progress Notes (Signed)
Synopsis: Referred in September 2019 for shortness of breath by Marge Duncans, PA-C  Subjective:   PATIENT ID: Shannon [name] GENDER: female DOB: 1960/11/23, MRN: [id number]  Chief Complaint  Patient presents with   Follow-up    Generally feel off a bit.  Using the Albuterol more often.  Concerned about lab result flags.    She was diagnosed with bronchitis in July followed by pneumonia. She was treated with antibiotics and prednisone. Unsure how long but she felt better after. She was started on symbicort as well. Once she felt better she stopped taking most of her medications. She started taking her symbicort again she felt worse for a little bit. After stopping the inhaler she felt better. Currently she is exercising and walking more. Not using an inhaler.   Former smoker, quit 12 years ago, history of asthma, lots of second hand smoker, mother was a smoker. She was in the ED as peds for asthma treatments. She did have allergy testing as a child but does not remember any details. Normally had 1-2 episodes of bronchitis with when she was smoking. This has stopped since she stopped smoking.    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  Previously seen by Dr. Valeta Harms however lost to follow-up with last visit in 2019. Previously walked 3 miles daily however limited due to left hip pain. Denies shortness of breath, cough or wheezing. Shortness of breath with strenuous activity. 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  02/05/22 In the last 6-12 months, she reports shortness of breath and chest tightness that can be triggered by perfumes, strong odors and changes in humidity. She is using her inhaler every other month and has noticed her voice cracking.   Quit smoking 15 years ago. Started at 34 and stopped in her 60s. 1 ppd x30 years.  Past Medical History:   Diagnosis Date   Anxiety    Arthritis    Asthma    Cancer ([place])    rectal, Lung   Dyspnea    with exertion   Emphysema of lung ([place])    patient denies   History of kidney stones    passed   HLD (hyperlipidemia)    Pneumonia 02/2018   Rectal tumor    cancer   Seasonal allergies      Family History  Problem Relation Age of Onset   Heart disease Mother    COPD Mother    Emphysema Mother    Diabetes Father    Colon cancer Neg Hx    Colon polyps Neg Hx    Esophageal cancer Neg Hx    Rectal cancer Neg Hx    Stomach cancer Neg Hx      Past Surgical History:  Procedure Laterality Date   ABLATION     endometrial   BIOPSY  10/10/2018   Procedure: BIOPSY;  Surgeon: Irving Copas., MD;  Location: [place];  Service: Gastroenterology;;   BIOPSY  06/12/2019   Procedure: BIOPSY;  Surgeon: Irving Copas., MD;  Location: [place];  Service: Gastroenterology;;   BIOPSY  09/30/2020   Procedure: BIOPSY;  Surgeon: Irving Copas., MD;  Location: [facility];  Service: Gastroenterology;;   COLONOSCOPY     DOPPLER ECHOCARDIOGRAPHY     ENDOSCOPIC MUCOSAL RESECTION N/A 10/10/2018   Procedure: ENDOSCOPIC MUCOSAL RESECTION;  Surgeon: Irving Copas., MD;  Location: [facility];  Service: Gastroenterology;  Laterality: N/A;   EUS N/A 07/18/2018   Procedure: LOWER ENDOSCOPIC ULTRASOUND (EUS);  Surgeon: Irving Copas., MD;  Location: [place];  Service: Gastroenterology;  Laterality: N/A;   EUS N/A 10/10/2018   Procedure: LOWER ENDOSCOPIC ULTRASOUND (EUS);  Surgeon: Irving Copas., MD;  Location: [place];  Service: Gastroenterology;  Laterality: N/A;   EUS N/A 06/12/2019   Procedure: LOWER ENDOSCOPIC ULTRASOUND (EUS);  Surgeon: Irving Copas., MD;  Location: [place];  Service: Gastroenterology;  Laterality: N/A;   EUS N/A 09/30/2020   Procedure: LOWER ENDOSCOPIC ULTRASOUND (EUS);  Surgeon: Irving Copas.,  MD;  Location: [facility];  Service: Gastroenterology;  Laterality: N/A;   FLEXIBLE SIGMOIDOSCOPY N/A 07/18/2018   Procedure: FLEXIBLE SIGMOIDOSCOPY;  Surgeon: Rush Landmark Telford Nab., MD;  Location: [place];  Service: Gastroenterology;  Laterality: N/A;   FLEXIBLE SIGMOIDOSCOPY N/A 10/10/2018   Procedure: FLEXIBLE SIGMOIDOSCOPY;  Surgeon: Rush Landmark Telford Nab., MD;  Location: [place];  Service: Gastroenterology;  Laterality: N/A;   FLEXIBLE SIGMOIDOSCOPY N/A 06/12/2019   Procedure: FLEXIBLE SIGMOIDOSCOPY;  Surgeon: Rush Landmark Telford Nab., MD;  Location: [place];  Service: Gastroenterology;  Laterality: N/A;   FLEXIBLE SIGMOIDOSCOPY N/A 09/30/2020   Procedure: FLEXIBLE SIGMOIDOSCOPY;  Surgeon: Rush Landmark Telford Nab., MD;  Location: [facility];  Service: Gastroenterology;  Laterality: N/A;   HEMOSTASIS CLIP PLACEMENT  10/10/2018   Procedure: HEMOSTASIS CLIP PLACEMENT;  Surgeon: Irving Copas., MD;  Location: [place];  Service: Gastroenterology;;   LUNG CANCER SURGERY  12/30/2018   right lun- mid lobe , dr gerhardt   SUBMUCOSAL LIFTING INJECTION  10/10/2018   Procedure: SUBMUCOSAL LIFTING INJECTION;  Surgeon: Irving Copas., MD;  Location: [facility];  Service: Gastroenterology;;   TEE WITHOUT CARDIOVERSION  02/2018   TONSILLECTOMY     TUBAL LIGATION  1987   VIDEO ASSISTED THORACOSCOPY (VATS)/WEDGE RESECTION Right 12/30/2018   Procedure: VIDEO ASSISTED THORACOSCOPY (VATS), MINI THORACOTOMY, RIGHT MIDDLE LOBECTOMY WITH LYMPH NODE DISSECTION;  Surgeon: Grace Isaac, MD;  Location: [place];  Service: Thoracic;  Laterality: Right;   VIDEO BRONCHOSCOPY N/A 10/12/2018   Procedure: VIDEO BRONCHOSCOPY;  Surgeon: Grace Isaac, MD;  Location: [place];  Service: Thoracic;  Laterality: N/A;   VIDEO BRONCHOSCOPY N/A 12/30/2018   Procedure: VIDEO BRONCHOSCOPY;  Surgeon: Grace Isaac, MD;  Location: [place];  Service: Thoracic;  Laterality: N/A;   WISDOM TOOTH  EXTRACTION      Social History   Socioeconomic History   Marital status: Married    Spouse name: Not on file   Number of children: Not on file   Years of education: Not on file   Highest education level: Not on file  Occupational History   Not on file  Tobacco Use   Smoking status: Former    Years: 33.00    Types: Cigarettes    Quit date: 2008    Years since quitting: 15.5   Smokeless tobacco: Never  Vaping Use   Vaping Use: Never used  Substance and Sexual Activity   Alcohol use: Never    Comment: rare   Drug use: Never   Sexual activity: Not Currently    Birth control/protection: Post-menopausal  Other Topics Concern   Not on file  Social History Narrative   Not on file   Social Determinants of Health   Financial Resource Strain: Not on file  Food Insecurity: Not on file  Transportation Needs: Not on file  Physical Activity: Not on file  Stress: Not on  file  Social Connections: Not on file  Intimate Partner Violence: Not on file     Allergies  Allergen Reactions   Hydrocodone Nausea Only    "terrible headache"   Pravastatin Other (See Comments)   Other     UNSPECIFIED REACTION  Dog Dander    Levaquin [Levofloxacin In D5w] Nausea And Vomiting          Outpatient Medications Prior to Visit  Medication Sig Dispense Refill   acetaminophen (TYLENOL) 325 MG tablet Take 650 mg by mouth every 6 (six) hours as needed for moderate pain.     albuterol (VENTOLIN HFA) 108 (90 Base) MCG/ACT inhaler Inhale 2 puffs into the lungs every 6 (six) hours as needed for wheezing or shortness of breath. 1 each 2   busPIRone (BUSPAR) 10 MG tablet TAKE HALF TABLET BY MOUTH TWICE DAILY 60 tablet 1   calcium carbonate (TUMS - DOSED IN MG ELEMENTAL CALCIUM) 500 MG chewable tablet Chew 1 tablet by mouth daily as needed for indigestion or heartburn.     Cholecalciferol (VITAMIN D-3) 125 MCG (5000 UT) TABS Take 5,000 Units by mouth every evening.     Cyanocobalamin (B-12) 2500 MCG  TABS Take 2,500 mcg by mouth daily.     gabapentin (NEURONTIN) 300 MG capsule Take 300 mg by mouth daily as needed.     lisinopril (ZESTRIL) 20 MG tablet Take 1 tablet (20 mg total) by mouth daily. 90 tablet 3   LORazepam (ATIVAN) 1 MG tablet TAKE 1 TABLET(1 MG) BY MOUTH AT BEDTIME 30 tablet 1   meloxicam (MOBIC) 7.5 MG tablet TAKE 1 TABLET(7.5 MG) BY MOUTH TWICE DAILY AS NEEDED FOR PAIN 60 tablet 5   PRALUENT 75 MG/ML SOAJ INJECT CONTENTS OF 1 PEN EVERY 2 WEEKS 2 mL 1   cyclobenzaprine (FLEXERIL) 10 MG tablet Take 10 mg by mouth 3 (three) times daily as needed.     No facility-administered medications prior to visit.    Review of Systems  Constitutional:  Negative for chills, diaphoresis, fever, malaise/fatigue and weight loss.  HENT:  Negative for congestion.   Respiratory:  Positive for shortness of breath. Negative for cough, hemoptysis, sputum production and wheezing.   Cardiovascular:  Negative for chest pain, palpitations and leg swelling.     Objective:   Blood pressure 116/80, pulse 64, temperature 98 F (36.7 C), temperature source Oral, height 5' 5.5" (1.664 m), weight 183 lb 6.4 oz (83.2 kg), SpO2 98 %.  Physical Exam: General: Well-appearing, no acute distress HENT: [place], AT Eyes: EOMI, no scleral icterus Respiratory: Clear to auscultation bilaterally.  No crackles, wheezing or rales Cardiovascular: RRR, -M/R/G, no JVD Extremities:-Edema,-tenderness Neuro: AAO x4, CNII-XII grossly intact Psych: Normal mood, normal affect  Data Reviewed:  Chest Imaging: CT chest 03/02/2018: No evidence of PE, mild centrilobular emphysema, small hiatal hernia. CT chest 05/02/2020-emphysema and right apical scarring.  Status post right middle lobectomy. PET DOTATATE 11/21/2020-no evidence of recurrence of bronchial carcinoid  PFT: 06/11/18 FVC 3.05 (88%) FEV1 2.17 (80%) ratio 79 TLC 114% RV/TLC 131% DLCO 89% Interpretation: No obstructive or restrictive defect present based on  spirometric values.  No significant bronchodilator response however does not preclude the benefit of inhaler therapy.  Air-trapping present however unclear significance in the absence of obstruction.  F-V loops suggestive of airway obstruction.   Cardiac: Echocardiogram 03/23/2018: Left ventricle: The cavity size was normal. Wall thickness was   normal. Systolic function was normal. The estimated ejection   fraction was in the  range of 55% to 60%. Wall motion was normal;   there were no regional wall motion abnormalities. Doppler   parameters are consistent with abnormal left ventricular   relaxation (grade 1 diastolic dysfunction).  Treadmill exercise stress test: 03/23/2018: Normal heart rate response with Bruce protocol.  Labs: CBC    Component Value Date/Time   WBC 6.3 11/19/2021 0848   WBC 10.2 01/02/2019 0425   RBC 4.77 11/19/2021 0848   RBC 4.54 01/02/2019 0425   HGB 13.7 11/19/2021 0848   HCT 42.1 11/19/2021 0848   PLT 272 11/19/2021 0848   MCV 88 11/19/2021 0848   MCH 28.7 11/19/2021 0848   MCH 28.4 01/02/2019 0425   MCHC 32.5 11/19/2021 0848   MCHC 30.9 01/02/2019 0425   RDW 12.6 11/19/2021 0848   LYMPHSABS 1.9 11/19/2021 0848   MONOABS 0.6 04/11/2018 1417   EOSABS 0.3 11/19/2021 0848   BASOSABS 0.0 11/19/2021 0848      Latest Ref Rng & Units 11/19/2021    8:48 AM 10/22/2021    3:34 PM 09/11/2021    1:56 PM  CMP  Glucose 70 - 99 mg/dL 90  90  84   BUN 8 - 27 mg/dL 15  21  21    Creatinine 0.57 - 1.00 mg/dL 0.89  0.76  0.82   Sodium 134 - 144 mmol/L 141  141  141   Potassium 3.5 - 5.2 mmol/L 5.2  4.9  5.0   Chloride 96 - 106 mmol/L 101  104  103   CO2 20 - 29 mmol/L 26  26  26    Calcium 8.7 - 10.3 mg/dL 10.5  10.1  10.3   Total Protein 6.0 - 8.5 g/dL 6.6  6.4  6.5   Total Bilirubin 0.0 - 1.2 mg/dL 0.4  0.3  0.4   Alkaline Phos 44 - 121 IU/L 78  77  73   AST 0 - 40 IU/L 14  13  21    ALT 0 - 32 IU/L 11  15  14       Assessment & Plan:   Centrilobular  emphysema (HCC)  Discussion: 61 year old female with childhood asthma, history of bronchial carcinoid s/p RML lobectomy without recurrence and recurrent URIs who presents for follow-up. Intermittent symptoms triggered by odors. Discussed bronchodilators including ICS/LABA vs LAMA. Plan to start Singulair first followed by trial of LAMA. 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  Intermittent shortness of breath/wheezing Emphysema --CONTINUE Albuterol AS NEEDED for shortness of breath or wheezing --START Singulair 10 mg nightly. This is your EVERYDAY medication  --START Spiriva TWO puffs daily when you are ready. If this is effective, please call for prescription  Bronchial carcinoid s/p RML lobectomy 2020 No reoccurrence Followed by CTS annually   Current Outpatient Medications:    acetaminophen (TYLENOL) 325 MG tablet, Take 650 mg by mouth every 6 (six) hours as needed for moderate pain., Disp: , Rfl:    albuterol (VENTOLIN HFA) 108 (90 Base) MCG/ACT inhaler, Inhale 2 puffs into the lungs every 6 (six) hours as needed for wheezing or shortness of breath., Disp: 1 each, Rfl: 2   busPIRone (BUSPAR) 10 MG tablet, TAKE HALF TABLET BY MOUTH TWICE DAILY, Disp: 60 tablet, Rfl: 1   calcium carbonate (TUMS - DOSED IN MG ELEMENTAL CALCIUM) 500 MG chewable tablet, Chew 1 tablet by mouth daily as needed for indigestion or heartburn., Disp: , Rfl:    Cholecalciferol (VITAMIN D-3) 125  MCG (5000 UT) TABS, Take 5,000 Units by mouth every evening., Disp: , Rfl:    Cyanocobalamin (B-12) 2500 MCG TABS, Take 2,500 mcg by mouth daily., Disp: , Rfl:    gabapentin (NEURONTIN) 300 MG capsule, Take 300 mg by mouth daily as needed., Disp: , Rfl:    lisinopril (ZESTRIL) 20 MG tablet, Take 1 tablet (20 mg total) by mouth daily., Disp: 90 tablet, Rfl: 3   LORazepam (ATIVAN) 1 MG tablet, TAKE 1 TABLET(1 MG) BY MOUTH AT BEDTIME, Disp: 30 tablet,  Rfl: 1   meloxicam (MOBIC) 7.5 MG tablet, TAKE 1 TABLET(7.5 MG) BY MOUTH TWICE DAILY AS NEEDED FOR PAIN, Disp: 60 tablet, Rfl: 5   montelukast (SINGULAIR) 10 MG tablet, Take 1 tablet (10 mg total) by mouth at bedtime., Disp: 30 tablet, Rfl: 2   PRALUENT 75 MG/ML SOAJ, INJECT CONTENTS OF 1 PEN EVERY 2 WEEKS, Disp: 2 mL, Rfl: 1   Tiotropium Bromide Monohydrate (SPIRIVA RESPIMAT) 2.5 MCG/ACT AERS, Inhale 2 puffs into the lungs daily., Disp: 1 each, Rfl: 0   cyclobenzaprine (FLEXERIL) 10 MG tablet, Take 10 mg by mouth 3 (three) times daily as needed., Disp: , Rfl:   I have spent a total time of 30-minutes on the day of the appointment including chart review, data review, collecting history, coordinating care and discussing medical diagnosis and plan with the patient/family. Past medical history, allergies, medications were reviewed. Pertinent imaging, labs and tests included in this note have been reviewed and interpreted independently by me.  Trentan Trippe Rodman Pickle, MD [facility] 02/05/2022 11:02 AM

## 2022-02-05 NOTE — Patient Instructions (Addendum)
Intermittent shortness of breath/wheezing Emphysema --CONTINUE Albuterol AS NEEDED for shortness of breath or wheezing --START Singulair 10 mg nightly. This is your EVERYDAY medication  --START Spiriva TWO puffs daily when you are ready. If this is effective, please call for prescription  Follow-up with me in 6 months

## 2022-02-06 ENCOUNTER — Ambulatory Visit
Admission: RE | Admit: 2022-02-06 | Discharge: 2022-02-06 | Disposition: A | Discharge status: Home / Self Care | Payer: BC Managed Care – PPO | Source: Ambulatory Visit | Attending: Thoracic Surgery (Cardiothoracic Vascular Surgery) | Admitting: Thoracic Surgery (Cardiothoracic Vascular Surgery)

## 2022-02-06 ENCOUNTER — Ambulatory Visit (INDEPENDENT_AMBULATORY_CARE_PROVIDER_SITE_OTHER): Payer: BC Managed Care – PPO | Admitting: Thoracic Surgery (Cardiothoracic Vascular Surgery)

## 2022-02-06 DIAGNOSIS — C7A090 Malignant carcinoid tumor of the bronchus and lung: Secondary | ICD-10-CM

## 2022-02-06 NOTE — Progress Notes (Signed)
     [address]       [place] [address]             [phone]       Patient: Home Provider: Office Consent for Telemedicine visit obtained.  Today's visit was completed via a real-time telehealth (see specific modality noted below). The patient/authorized person provided oral consent at the time of the visit to engage in a telemedicine encounter with the present provider at [facility]. The patient/authorized person was informed of the potential benefits, limitations, and risks of telemedicine. The patient/authorized person expressed understanding that the laws that protect confidentiality also apply to telemedicine. The patient/authorized person acknowledged understanding that telemedicine does not provide emergency services and that he or she would need to call 911 or proceed to the nearest hospital for help if such a need arose.   Total time spent in the clinical discussion 10 minutes.  Telehealth Modality: Phone visit (audio only)  I had a telephone visit with Shannon [name].  Overall she is doing well.  She has no complaints.  Her CT scan is stable, without any evidence of recurrence.  She will continue her follow-up with the cancer center.  She will follow-up with [place] as needed.   IMPRESSION: 1. Stable postsurgical changes from a right middle lobe lobectomy. No evidence of recurrent tumor or metastatic disease. 2. Stable 10 mm AP window node.   3.  Emphysema and mild aortic atherosclerosis.

## 2022-02-09 ENCOUNTER — Telehealth: Payer: Self-pay

## 2022-02-09 ENCOUNTER — Other Ambulatory Visit: Payer: Self-pay

## 2022-02-09 ENCOUNTER — Telehealth: Payer: Self-pay | Admitting: Internal Medicine

## 2022-02-09 ENCOUNTER — Other Ambulatory Visit: Payer: Self-pay | Admitting: *Deleted

## 2022-02-09 MED ORDER — ALBUTEROL SULFATE HFA 108 (90 BASE) MCG/ACT IN AERS: 2.0000 | INHALATION_SPRAY | Freq: Four times a day (QID) | RESPIRATORY_TRACT | 2 refills | Status: DC | PRN

## 2022-02-09 NOTE — Telephone Encounter (Signed)
Scheduled appt per j7/17 referral. Pt is aware of appt date and time. Pt is aware to arrive 15 mins prior to appt time and to bring and updated insurance card. Pt is aware of appt location.

## 2022-02-09 NOTE — Telephone Encounter (Signed)
Tracee called to make Shannon [name] aware that her Praluent cost is $400.  She spoke to her insurance company and Repatha is their preferred drug.  They are going to fax prior authorization information for repatha.

## 2022-02-17 ENCOUNTER — Telehealth: Payer: Self-pay | Admitting: Medical Oncology

## 2022-02-17 NOTE — Telephone Encounter (Signed)
err

## 2022-02-18 ENCOUNTER — Other Ambulatory Visit: Payer: Self-pay

## 2022-02-18 ENCOUNTER — Inpatient Hospital Stay: Payer: BC Managed Care – PPO

## 2022-02-18 ENCOUNTER — Inpatient Hospital Stay: Payer: BC Managed Care – PPO | Attending: Internal Medicine | Admitting: Internal Medicine

## 2022-02-18 VITALS — BP 142/82 | HR 69 | Temp 97.0°F | Resp 19 | Wt 186.0 lb

## 2022-02-18 DIAGNOSIS — Z902 Acquired absence of lung [part of]: Secondary | ICD-10-CM | POA: Diagnosis not present

## 2022-02-18 DIAGNOSIS — I10 Essential (primary) hypertension: Secondary | ICD-10-CM | POA: Insufficient documentation

## 2022-02-18 DIAGNOSIS — F419 Anxiety disorder, unspecified: Secondary | ICD-10-CM | POA: Insufficient documentation

## 2022-02-18 DIAGNOSIS — E785 Hyperlipidemia, unspecified: Secondary | ICD-10-CM | POA: Insufficient documentation

## 2022-02-18 DIAGNOSIS — C7A090 Malignant carcinoid tumor of the bronchus and lung: Secondary | ICD-10-CM

## 2022-02-18 DIAGNOSIS — Z79899 Other long term (current) drug therapy: Secondary | ICD-10-CM | POA: Insufficient documentation

## 2022-02-18 DIAGNOSIS — J449 Chronic obstructive pulmonary disease, unspecified: Secondary | ICD-10-CM | POA: Insufficient documentation

## 2022-02-18 LAB — CBC WITH DIFFERENTIAL (CANCER CENTER ONLY)
Abs Immature Granulocytes: 0.01 10*3/uL (ref 0.00–0.07)
Basophils Absolute: 0 10*3/uL (ref 0.0–0.1)
Basophils Relative: 1 %
Eosinophils Absolute: 0.2 10*3/uL (ref 0.0–0.5)
Eosinophils Relative: 4 %
HCT: 40.3 % (ref 36.0–46.0)
Hemoglobin: 13.1 g/dL (ref 12.0–15.0)
Immature Granulocytes: 0 %
Lymphocytes Relative: 36 %
Lymphs Abs: 2.2 10*3/uL (ref 0.7–4.0)
MCH: 28 pg (ref 26.0–34.0)
MCHC: 32.5 g/dL (ref 30.0–36.0)
MCV: 86.1 fL (ref 80.0–100.0)
Monocytes Absolute: 0.5 10*3/uL (ref 0.1–1.0)
Monocytes Relative: 8 %
Neutro Abs: 3.2 10*3/uL (ref 1.7–7.7)
Neutrophils Relative %: 51 %
Platelet Count: 224 10*3/uL (ref 150–400)
RBC: 4.68 MIL/uL (ref 3.87–5.11)
RDW: 13.5 % (ref 11.5–15.5)
WBC Count: 6.2 10*3/uL (ref 4.0–10.5)
nRBC: 0 % (ref 0.0–0.2)

## 2022-02-18 LAB — CMP (CANCER CENTER ONLY)
ALT: 16 U/L (ref 0–44)
AST: 18 U/L (ref 15–41)
Albumin: 4.1 g/dL (ref 3.5–5.0)
Alkaline Phosphatase: 63 U/L (ref 38–126)
Anion gap: 6 (ref 5–15)
BUN: 26 mg/dL — ABNORMAL HIGH (ref 8–23)
CO2: 30 mmol/L (ref 22–32)
Calcium: 10.5 mg/dL — ABNORMAL HIGH (ref 8.9–10.3)
Chloride: 105 mmol/L (ref 98–111)
Creatinine: 0.82 mg/dL (ref 0.44–1.00)
GFR, Estimated: >60 mL/min (ref >60)
Glucose, Bld: 91 mg/dL (ref 70–99)
Potassium: 4.9 mmol/L (ref 3.5–5.1)
Sodium: 141 mmol/L (ref 135–145)
Total Bilirubin: 0.4 mg/dL (ref 0.3–1.2)
Total Protein: 6.7 g/dL (ref 6.5–8.1)

## 2022-02-18 NOTE — Progress Notes (Signed)
[place] [phone]   [phone]  CONSULT NOTE  REFERRING PHYSICIAN: Dr. Melodie Bouillon  REASON FOR CONSULTATION:  61 years old white female with history of carcinoid tumor  HPI DELONDA COLEY is a 61 y.o. female with past medical history significant for anxiety, asthma, COPD, dyslipidemia, kidney stone, hypertension and recent left hip replacement.  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  She was followed by observation and repeat imaging studies.  She was noted on CT scan in August 2019 to have a suspicious lesion in the right hilar area.  She had PET dotatate scan done on August 17, 2018 and that showed no evidence of residual neuroendocrine tumor within the rectum and no metastatic endocrine tumor adenopathy in the abdomen or pelvis but there was a single focus of intense uptake about the right hilum which is localized to a small pulmonary nodule concerning for bronchial carcinoid.  The patient was referred to Dr. Servando Snare and she underwent right middle lobectomy on December 30, 2018.  The final pathology ([id number]) showed typical carcinoid tumor with negative resection margin and no evidence for lymphovascular or visceral pleural invasion.  The tumor measured 1.0 cm.  The dissected lymph nodes were negative for malignancy. Immunohistochemistry for CK8/18, Synaptophysin, Chromogranin and TTF-1 is positive. CDX-2 is negative. Ki-67 proliferation index is low. The immunophenotype is compatible with typical carcinoid tumor, lung primary. She was followed by observation by Dr. Servando Snare but after his retirement she was seen by Dr. Kipp Brood.  She had another PET dotatate scan on 11/21/2020 and that showed no evidence for bronchial carcinoid recurrence in the thorax and no evidence of metastatic neuroendocrine tumor on the PET scan. On February 06, 2022 the patient had CT scan of the chest  without contrast and that showed stable postsurgical changes from the right middle lobectomy with no evidence of recurrent tumor or metastatic disease.  There was a stable 1.0 cm AP window lymph node. Dr. Kipp Brood kindly referred the patient to me today for evaluation and close monitoring of her condition. When seen today she is feeling fine except for the mild pain from the recent left hip surgery.  She denied having any chest pain but has occasional shortness of breath with exertion with no cough or hemoptysis.  She has occasional hot flashes no diarrhea or weight loss.  She has no nausea, vomiting, diarrhea or constipation.  She has no headache or visual changes.  She has no chest pain, cough or hemoptysis. Family history significant for mother with heart disease, COPD and uterine dysplasia.  Father had diabetes mellitus. The patient is married and has 2 children a son and daughter.  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  She has a history of smoking 1 pack/day for around 33 years and quit 15 years ago.  She has no history of alcohol or drug abuse.  HPI  Past Medical History:  Diagnosis Date   Anxiety    Arthritis    Asthma    Cancer ([place])    rectal, Lung   Dyspnea    with exertion   Emphysema of lung ([place])    patient denies   History of kidney stones    passed   HLD (hyperlipidemia)    Pneumonia 02/2018   Rectal tumor    cancer   Seasonal allergies     Past Surgical History:  Procedure Laterality  Date   ABLATION     endometrial   BIOPSY  10/10/2018   Procedure: BIOPSY;  Surgeon: Rush Landmark Telford Nab., MD;  Location: [facility];  Service: Gastroenterology;;   BIOPSY  06/12/2019   Procedure: BIOPSY;  Surgeon: Irving Copas., MD;  Location: [facility];  Service: Gastroenterology;;   BIOPSY  09/30/2020   Procedure: BIOPSY;  Surgeon: Irving Copas., MD;  Location: [facility];  Service: Gastroenterology;;   COLONOSCOPY      DOPPLER ECHOCARDIOGRAPHY     ENDOSCOPIC MUCOSAL RESECTION N/A 10/10/2018   Procedure: ENDOSCOPIC MUCOSAL RESECTION;  Surgeon: Irving Copas., MD;  Location: [place];  Service: Gastroenterology;  Laterality: N/A;   EUS N/A 07/18/2018   Procedure: LOWER ENDOSCOPIC ULTRASOUND (EUS);  Surgeon: Irving Copas., MD;  Location: [place];  Service: Gastroenterology;  Laterality: N/A;   EUS N/A 10/10/2018   Procedure: LOWER ENDOSCOPIC ULTRASOUND (EUS);  Surgeon: Irving Copas., MD;  Location: [place];  Service: Gastroenterology;  Laterality: N/A;   EUS N/A 06/12/2019   Procedure: LOWER ENDOSCOPIC ULTRASOUND (EUS);  Surgeon: Irving Copas., MD;  Location: [place];  Service: Gastroenterology;  Laterality: N/A;   EUS N/A 09/30/2020   Procedure: LOWER ENDOSCOPIC ULTRASOUND (EUS);  Surgeon: Irving Copas., MD;  Location: [facility];  Service: Gastroenterology;  Laterality: N/A;   FLEXIBLE SIGMOIDOSCOPY N/A 07/18/2018   Procedure: FLEXIBLE SIGMOIDOSCOPY;  Surgeon: Rush Landmark Telford Nab., MD;  Location: [place];  Service: Gastroenterology;  Laterality: N/A;   FLEXIBLE SIGMOIDOSCOPY N/A 10/10/2018   Procedure: FLEXIBLE SIGMOIDOSCOPY;  Surgeon: Rush Landmark Telford Nab., MD;  Location: [place];  Service: Gastroenterology;  Laterality: N/A;   FLEXIBLE SIGMOIDOSCOPY N/A 06/12/2019   Procedure: FLEXIBLE SIGMOIDOSCOPY;  Surgeon: Rush Landmark Telford Nab., MD;  Location: [place];  Service: Gastroenterology;  Laterality: N/A;   FLEXIBLE SIGMOIDOSCOPY N/A 09/30/2020   Procedure: FLEXIBLE SIGMOIDOSCOPY;  Surgeon: Rush Landmark Telford Nab., MD;  Location: [facility];  Service: Gastroenterology;  Laterality: N/A;   HEMOSTASIS CLIP PLACEMENT  10/10/2018   Procedure: HEMOSTASIS CLIP PLACEMENT;  Surgeon: Irving Copas., MD;  Location: [facility];  Service: Gastroenterology;;   LUNG CANCER SURGERY  12/30/2018   right lun- mid lobe , dr gerhardt    SUBMUCOSAL LIFTING INJECTION  10/10/2018   Procedure: SUBMUCOSAL LIFTING INJECTION;  Surgeon: Irving Copas., MD;  Location: [facility];  Service: Gastroenterology;;   TEE WITHOUT CARDIOVERSION  02/2018   TONSILLECTOMY     TUBAL LIGATION  1987   VIDEO ASSISTED THORACOSCOPY (VATS)/WEDGE RESECTION Right 12/30/2018   Procedure: VIDEO ASSISTED THORACOSCOPY (VATS), MINI THORACOTOMY, RIGHT MIDDLE LOBECTOMY WITH LYMPH NODE DISSECTION;  Surgeon: Grace Isaac, MD;  Location: [place];  Service: Thoracic;  Laterality: Right;   VIDEO BRONCHOSCOPY N/A 10/12/2018   Procedure: VIDEO BRONCHOSCOPY;  Surgeon: Grace Isaac, MD;  Location: [place];  Service: Thoracic;  Laterality: N/A;   VIDEO BRONCHOSCOPY N/A 12/30/2018   Procedure: VIDEO BRONCHOSCOPY;  Surgeon: Grace Isaac, MD;  Location: [facility];  Service: Thoracic;  Laterality: N/A;   WISDOM TOOTH EXTRACTION      Family History  Problem Relation Age of Onset   Heart disease Mother    COPD Mother    Emphysema Mother    Diabetes Father    Colon cancer Neg Hx    Colon polyps Neg Hx    Esophageal cancer Neg Hx    Rectal cancer Neg Hx    Stomach cancer Neg Hx     Social History Social History  Tobacco Use   Smoking status: Former    Years: 33.00    Types: Cigarettes    Quit date: 2008    Years since quitting: 15.5   Smokeless tobacco: Never  Vaping Use   Vaping Use: Never used  Substance Use Topics   Alcohol use: Never    Comment: rare   Drug use: Never    Allergies  Allergen Reactions   Hydrocodone Nausea Only    "terrible headache"   Pravastatin Other (See Comments)   Other     UNSPECIFIED REACTION  Dog Dander    Levaquin [Levofloxacin In D5w] Nausea And Vomiting         Current Outpatient Medications  Medication Sig Dispense Refill   acetaminophen (TYLENOL) 325 MG tablet Take 650 mg by mouth every 6 (six) hours as needed for moderate pain.     albuterol (VENTOLIN HFA) 108 (90 Base) MCG/ACT inhaler Inhale  2 puffs into the lungs every 6 (six) hours as needed for wheezing or shortness of breath. 1 each 2   busPIRone (BUSPAR) 10 MG tablet TAKE HALF TABLET BY MOUTH TWICE DAILY 60 tablet 1   calcium carbonate (TUMS - DOSED IN MG ELEMENTAL CALCIUM) 500 MG chewable tablet Chew 1 tablet by mouth daily as needed for indigestion or heartburn.     Cholecalciferol (VITAMIN D-3) 125 MCG (5000 UT) TABS Take 5,000 Units by mouth every evening.     Cyanocobalamin (B-12) 2500 MCG TABS Take 2,500 mcg by mouth daily.     cyclobenzaprine (FLEXERIL) 10 MG tablet Take 10 mg by mouth 3 (three) times daily as needed.     gabapentin (NEURONTIN) 300 MG capsule Take 300 mg by mouth daily as needed.     lisinopril (ZESTRIL) 20 MG tablet Take 1 tablet (20 mg total) by mouth daily. 90 tablet 3   LORazepam (ATIVAN) 1 MG tablet TAKE 1 TABLET(1 MG) BY MOUTH AT BEDTIME 30 tablet 1   meloxicam (MOBIC) 7.5 MG tablet TAKE 1 TABLET(7.5 MG) BY MOUTH TWICE DAILY AS NEEDED FOR PAIN 60 tablet 5   montelukast (SINGULAIR) 10 MG tablet Take 1 tablet (10 mg total) by mouth at bedtime. 30 tablet 2   PRALUENT 75 MG/ML SOAJ INJECT CONTENTS OF 1 PEN EVERY 2 WEEKS 2 mL 1   Tiotropium Bromide Monohydrate (SPIRIVA RESPIMAT) 2.5 MCG/ACT AERS Inhale 2 puffs into the lungs daily. 1 each 0   No current facility-administered medications for this visit.    Review of Systems  Constitutional: negative Eyes: negative Ears, nose, mouth, throat, and face: negative Respiratory: positive for dyspnea on exertion Cardiovascular: negative Gastrointestinal: negative Genitourinary:negative Integument/breast: negative Hematologic/lymphatic: negative Musculoskeletal:positive for arthralgias Neurological: negative Behavioral/Psych: negative Endocrine: negative Allergic/Immunologic: negative  Physical Exam  BPP:HKFEX, healthy, no distress, well nourished, well developed, and anxious SKIN: skin color, texture, turgor are normal, no rashes or significant  lesions HEAD: Normocephalic, No masses, lesions, tenderness or abnormalities EYES: normal, PERRLA, Conjunctiva are pink and non-injected EARS: External ears normal, Canals clear OROPHARYNX:no exudate, no erythema, and lips, buccal mucosa, and tongue normal  NECK: supple, no adenopathy, no JVD LYMPH:  no palpable lymphadenopathy, no hepatosplenomegaly BREAST:not examined LUNGS: clear to auscultation , and palpation HEART: regular rate & rhythm, no murmurs, and no gallops ABDOMEN:abdomen soft, non-tender, normal bowel sounds, and no masses or organomegaly BACK: Back symmetric, no curvature., No CVA tenderness EXTREMITIES:no joint deformities, effusion, or inflammation, no edema  NEURO: alert & oriented x 3 with fluent speech, no focal motor/sensory deficits  PERFORMANCE STATUS: ECOG 1  LABORATORY DATA: Lab Results  Component Value Date   WBC 6.2 02/18/2022   HGB 13.1 02/18/2022   HCT 40.3 02/18/2022   MCV 86.1 02/18/2022   PLT 224 02/18/2022      Chemistry      Component Value Date/Time   NA 141 11/19/2021 0848   K 5.2 11/19/2021 0848   CL 101 11/19/2021 0848   CO2 26 11/19/2021 0848   BUN 15 11/19/2021 0848   CREATININE 0.89 11/19/2021 0848      Component Value Date/Time   CALCIUM 10.5 (H) 11/19/2021 0848   ALKPHOS 78 11/19/2021 0848   AST 14 11/19/2021 0848   ALT 11 11/19/2021 0848   BILITOT 0.4 11/19/2021 0848       RADIOGRAPHIC STUDIES: CT CHEST WO CONTRAST  Result Date: 02/06/2022 CLINICAL DATA:  Lung nodule, < 32m, high cancer risk. Patient with carcinoid bronchial adenoma, status post right middle lobectomy. Rectal cancer, status post surgery. Former smoker. EXAM: CT CHEST WITHOUT CONTRAST TECHNIQUE: Multidetector CT imaging of the chest was performed following the standard protocol without IV contrast. RADIATION DOSE REDUCTION: This exam was performed according to the departmental dose-optimization program which includes automated exposure control, adjustment  of the mA and/or kV according to patient size and/or use of iterative reconstruction technique. COMPARISON:  May 02, 2020 FINDINGS: Cardiovascular: No significant vascular findings. Normal heart size. No pericardial effusion. Mediastinum/Nodes: Stable 1 cm AP window node (image 67/2). No mass or new lymph node seen. Lungs/Pleura: Emphysema. Stable surgical changes from a right middle lobe lobectomy. No findings of recurrent tumor. Lungs are clear without a discrete pulmonary nodule or consolidation. Upper Abdomen: No hepatic or adrenal gland lesions. Solid organs have a normal appearance. Musculoskeletal: Moderate thoracic spondylosis. No supraclavicular or axillary lymphadenopathy. No breast masses. IMPRESSION: 1. Stable postsurgical changes from a right middle lobe lobectomy. No evidence of recurrent tumor or metastatic disease. 2. Stable 10 mm AP window node. 3.  Emphysema and mild aortic atherosclerosis. Electronically Signed   By: AFrazier RichardsM.D.   On: 02/06/2022 13:52    ASSESSMENT: This is a very pleasant 61years old white female with history of stage Ia (T1a, N0, M0) typical carcinoid of the lung status post right middle lobectomy with lymph node dissection on December 30, 2018 under the care of Dr. GServando Snare  The patient also has a history of rectal carcinoid tumor status post surgical resection in 2019.   PLAN: I had a lengthy discussion with the patient today about her condition.  I explained to the patient that she had surgical resection which is a curative option for her condition and there is no benefit for additional treatment as long the patient is asymptomatic. I recommended for her to continue on observation with repeat CT scan of the chest, abdomen and pelvis in 1 year. If you see anything concerning on the scan imaging, we will consider her for PET dotatate scan. The patient was advised to call immediately if she has any other concerning symptoms in the interval. The patient voices  understanding of current disease status and treatment options and is in agreement with the current care plan.  All questions were answered. The patient knows to call the clinic with any problems, questions or concerns. We can certainly see the patient much sooner if necessary.  Thank you so much for allowing me to participate in the care of CDESHAE DICKISON I will continue to follow up the patient with you  and assist in her care.  The total time spent in the appointment was 60 minutes.  Disclaimer: This note was dictated with voice recognition software. Similar sounding words can inadvertently be transcribed and may not be corrected upon review.   Eilleen Kempf February 18, 2022, 2:33 PM

## 2022-02-25 ENCOUNTER — Ambulatory Visit: Payer: BC Managed Care – PPO | Admitting: Physician Assistant

## 2022-02-25 ENCOUNTER — Telehealth: Payer: Self-pay

## 2022-02-25 ENCOUNTER — Other Ambulatory Visit: Payer: Self-pay | Admitting: Physician Assistant

## 2022-02-25 ENCOUNTER — Encounter: Payer: Self-pay | Admitting: Physician Assistant

## 2022-02-25 VITALS — BP 130/80 | HR 71 | Temp 96.9°F | Ht 65.5 in | Wt 183.4 lb

## 2022-02-25 DIAGNOSIS — N3001 Acute cystitis with hematuria: Secondary | ICD-10-CM

## 2022-02-25 DIAGNOSIS — D3A026 Benign carcinoid tumor of the rectum: Secondary | ICD-10-CM

## 2022-02-25 DIAGNOSIS — C7A090 Malignant carcinoid tumor of the bronchus and lung: Secondary | ICD-10-CM

## 2022-02-25 DIAGNOSIS — E782 Mixed hyperlipidemia: Secondary | ICD-10-CM | POA: Diagnosis not present

## 2022-02-25 DIAGNOSIS — I10 Essential (primary) hypertension: Secondary | ICD-10-CM

## 2022-02-25 DIAGNOSIS — Z23 Encounter for immunization: Secondary | ICD-10-CM | POA: Diagnosis not present

## 2022-02-25 DIAGNOSIS — F419 Anxiety disorder, unspecified: Secondary | ICD-10-CM

## 2022-02-25 DIAGNOSIS — E559 Vitamin D deficiency, unspecified: Secondary | ICD-10-CM

## 2022-02-25 LAB — POCT URINALYSIS DIP (CLINITEK)
Bilirubin, UA: NEGATIVE
Glucose, UA: NEGATIVE mg/dL
Ketones, POC UA: NEGATIVE mg/dL
Nitrite, UA: NEGATIVE
Spec Grav, UA: 1.02 (ref 1.010–1.025)
Urobilinogen, UA: 0.2 E.U./dL
pH, UA: 6 (ref 5.0–8.0)

## 2022-02-25 MED ORDER — LORAZEPAM 1 MG PO TABS: ORAL_TABLET | ORAL | 1 refills | Status: DC

## 2022-02-25 MED ORDER — REPATHA 140 MG/ML ~~LOC~~ SOSY: 140.0000 mg | PREFILLED_SYRINGE | SUBCUTANEOUS | 2 refills | Status: DC

## 2022-02-25 MED ORDER — NITROFURANTOIN MONOHYD MACRO 100 MG PO CAPS: 100.0000 mg | ORAL_CAPSULE | Freq: Two times a day (BID) | ORAL | 0 refills | Status: DC

## 2022-02-25 NOTE — Telephone Encounter (Signed)
Patient left VM, she spoke with insurance regarding cholesterol injectable. Insurance will not cover praluent but will cover repatha.   Royce Macadamia, [place] 02/25/22 3:25 PM

## 2022-02-25 NOTE — Progress Notes (Signed)
Subjective:  Patient ID: Shannon [name], female    DOB: [birth year]  Age: 61 y.o. MRN: [id number]  Chief Complaint  Patient presents with      Hypertension      Pt presents for follow up of hypertension.  The patient is tolerating the medication well without side effects. Compliance with treatment has been good; including taking medication as directed , maintains a healthy diet and regular exercise regimen , and following up as directed. She is currently on zestril 20mg qd  Mixed hyperlipidemia  Pt presents with hyperlipidemia. Compliance with treatment has been good She maintains a low cholesterol diet , follows up as directed , and maintains an exercise regimen . The patient denies experiencing any hypercholesterolemia related symptoms.  Patient has been approved for both Praluent and Repatha however she is not able to afford her out of pocket cost - will double check with insurance about coverage and cost  Pt did have left hip replacement since last visit and is doing well.  She still is on meloxicam and uses flexeril and gabapentin on as needed basis  Pt with history of anxiety - stable on ativan and buspar - requests refill of ativan  Pt with history of emphysema - she did see her pulmonologist recently who placed her on singulair and spiriva in addition to albuterol.  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     albuterol (VENTOLIN HFA) 108 (90 Base) MCG/ACT inhaler Inhale 2 puffs into the lungs every 6 (six) hours as needed for wheezing or shortness of breath. 1 each 2   busPIRone (BUSPAR) 10 MG tablet TAKE HALF TABLET BY MOUTH TWICE DAILY 60 tablet 1   calcium carbonate (TUMS - DOSED IN MG ELEMENTAL CALCIUM) 500 MG chewable tablet Chew 1 tablet by mouth daily as needed for indigestion or heartburn.     Cholecalciferol (VITAMIN D-3) 125 MCG (5000 UT) TABS Take 5,000 Units by mouth every evening.     Cyanocobalamin (B-12) 2500 MCG TABS Take 2,500 mcg by mouth daily.     cyclobenzaprine (FLEXERIL) 10 MG tablet Take 10 mg by mouth 3 (three) times daily as needed.     gabapentin (NEURONTIN) 300 MG capsule Take 300 mg by mouth daily as needed.     lisinopril (ZESTRIL) 20 MG tablet Take 1 tablet (20 mg total) by mouth daily. 90 tablet 3   meloxicam (MOBIC) 7.5 MG tablet TAKE 1 TABLET(7.5 MG) BY MOUTH TWICE DAILY AS NEEDED FOR PAIN 60 tablet 5   montelukast (SINGULAIR) 10 MG tablet Take 1 tablet (10 mg total) by mouth at bedtime. 30 tablet 2   PRALUENT 75 MG/ML SOAJ INJECT CONTENTS OF 1 PEN EVERY 2 WEEKS 2 mL 1   No current facility-administered medications on file prior to visit.   Past  Medical History:  Diagnosis Date   Anxiety    Arthritis    Asthma    Cancer ([place])    rectal, Lung   Dyspnea    with exertion   Emphysema of lung ([place])    patient denies   History of kidney stones    passed   HLD (hyperlipidemia)    Pneumonia 02/2018   Rectal tumor    cancer   Seasonal allergies    Past Surgical History:  Procedure Laterality Date   ABLATION     endometrial   BIOPSY  10/10/2018   Procedure: BIOPSY;  Surgeon: Irving Copas., MD;  Location: [place];  Service: Gastroenterology;;   BIOPSY  06/12/2019   Procedure: BIOPSY;  Surgeon: Irving Copas., MD;  Location:  [place];  Service: Gastroenterology;;   BIOPSY  09/30/2020   Procedure: BIOPSY;  Surgeon: Irving Copas., MD;  Location: [facility];  Service: Gastroenterology;;   COLONOSCOPY     DOPPLER ECHOCARDIOGRAPHY     ENDOSCOPIC MUCOSAL RESECTION N/A 10/10/2018   Procedure: ENDOSCOPIC MUCOSAL RESECTION;  Surgeon: Irving Copas., MD;  Location: [place];  Service: Gastroenterology;  Laterality: N/A;   EUS N/A 07/18/2018   Procedure: LOWER ENDOSCOPIC ULTRASOUND (EUS);  Surgeon: Irving Copas., MD;  Location: [place];  Service: Gastroenterology;  Laterality: N/A;   EUS N/A 10/10/2018   Procedure: LOWER ENDOSCOPIC ULTRASOUND (EUS);  Surgeon: Irving Copas., MD;  Location: [place];  Service: Gastroenterology;  Laterality: N/A;   EUS N/A 06/12/2019   Procedure: LOWER ENDOSCOPIC ULTRASOUND (EUS);  Surgeon: Irving Copas., MD;  Location: [place];  Service: Gastroenterology;  Laterality: N/A;   EUS N/A 09/30/2020   Procedure: LOWER ENDOSCOPIC ULTRASOUND (EUS);  Surgeon: Irving Copas., MD;  Location: [facility];  Service: Gastroenterology;  Laterality: N/A;   FLEXIBLE SIGMOIDOSCOPY N/A 07/18/2018   Procedure: FLEXIBLE SIGMOIDOSCOPY;  Surgeon: Rush Landmark Telford Nab., MD;  Location: [place];  Service: Gastroenterology;  Laterality: N/A;   FLEXIBLE SIGMOIDOSCOPY N/A 10/10/2018   Procedure: FLEXIBLE SIGMOIDOSCOPY;  Surgeon: Rush Landmark Telford Nab., MD;  Location: [place];  Service: Gastroenterology;  Laterality: N/A;   FLEXIBLE SIGMOIDOSCOPY N/A 06/12/2019   Procedure: FLEXIBLE SIGMOIDOSCOPY;  Surgeon: Rush Landmark Telford Nab., MD;  Location: [place];  Service: Gastroenterology;  Laterality: N/A;   FLEXIBLE SIGMOIDOSCOPY N/A 09/30/2020   Procedure: FLEXIBLE SIGMOIDOSCOPY;  Surgeon: Rush Landmark Telford Nab., MD;  Location: [facility];  Service: Gastroenterology;  Laterality: N/A;   HEMOSTASIS CLIP PLACEMENT  10/10/2018    Procedure: HEMOSTASIS CLIP PLACEMENT;  Surgeon: Irving Copas., MD;  Location: [place];  Service: Gastroenterology;;   LUNG CANCER SURGERY  12/30/2018   right lun- mid lobe , dr gerhardt   SUBMUCOSAL LIFTING INJECTION  10/10/2018   Procedure: SUBMUCOSAL LIFTING INJECTION;  Surgeon: Irving Copas., MD;  Location: [place];  Service: Gastroenterology;;   TEE WITHOUT CARDIOVERSION  02/2018   TONSILLECTOMY     TUBAL LIGATION  1987   VIDEO ASSISTED THORACOSCOPY (VATS)/WEDGE RESECTION Right 12/30/2018   Procedure: VIDEO ASSISTED THORACOSCOPY (VATS), MINI THORACOTOMY, RIGHT MIDDLE LOBECTOMY WITH LYMPH NODE DISSECTION;  Surgeon: Grace Isaac, MD;  Location: [facility];  Service: Thoracic;  Laterality: Right;   VIDEO BRONCHOSCOPY N/A 10/12/2018   Procedure: VIDEO BRONCHOSCOPY;  Surgeon: Grace Isaac, MD;  [facility];  Service: Thoracic;  Laterality: N/A;   VIDEO BRONCHOSCOPY N/A 12/30/2018   Procedure: VIDEO BRONCHOSCOPY;  Surgeon: Grace Isaac, MD;  Location: [place];  Service: Thoracic;  Laterality: N/A;   WISDOM TOOTH EXTRACTION      Family History  Problem Relation Age of Onset   Heart disease Mother    COPD Mother    Emphysema Mother    Diabetes Father    Colon cancer Neg Hx    Colon polyps Neg Hx    Esophageal cancer Neg Hx    Rectal cancer Neg Hx    Stomach cancer Neg Hx    Social History   Socioeconomic History   Marital status: Married    Spouse name: Not on file   Number of children: Not on file   Years of education: Not on file   Highest education level: Not on file  Occupational History   Not on file  Tobacco Use   Smoking status: Former    Years: 33.00    Types: Cigarettes    Quit date: 2008    Years since quitting: 15.5   Smokeless tobacco: Never  Vaping Use   Vaping Use: Never used  Substance and Sexual Activity   Alcohol use: Never    Comment: rare   Drug use: Never   Sexual activity: Not Currently    Birth  control/protection: Post-menopausal  Other Topics Concern   Not on file  Social History Narrative   Not on file   Social Determinants of Health   Financial Resource Strain: Not on file  Food Insecurity: Not on file  Transportation Needs: Not on file  Physical Activity: Not on file  Stress: Not on file  Social Connections: Not on file  CONSTITUTIONAL: Negative for chills, fatigue, fever, unintentional weight gain and unintentional weight loss.  E/N/T: Negative for ear pain, nasal congestion and sore throat.  CARDIOVASCULAR: Negative for chest pain, dizziness, palpitations and pedal edema.  RESPIRATORY: see HPI GASTROINTESTINAL: Negative for abdominal pain, acid reflux symptoms, constipation, diarrhea, nausea and vomiting.  GU - see HPI MSK: see HPI INTEGUMENTARY: Negative for rash.   PSYCHIATRIC: Negative for sleep disturbance and to question depression screen.  Negative for depression, negative for anhedonia.       Objective:  PHYSICAL EXAM:   VS: BP 130/80 (BP Location: Left Arm, Patient Position: Sitting, Cuff Size: Normal)   Pulse 71   Temp (!) 96.9 F (36.1 C) (Temporal)   Ht 5' 5.5" (1.664 m)   Wt 183 lb 6.4 oz (83.2 kg)   LMP  (LMP Unknown)   SpO2 98%   BMI 30.06 kg/m   GEN: Well nourished, well developed, in no acute distress   Cardiac: RRR; no murmurs, rubs, or gallops,no edema - Respiratory:  normal respiratory rate and pattern with no distress - normal breath sounds with no rales, rhonchi, wheezes or rubs  MS: no deformity or atrophy  Skin: warm and dry, no rash   Psych: euthymic mood, appropriate affect and demeanor  Office Visit on 02/25/2022  Component Date Value Ref Range Status   Color, UA 02/25/2022 yellow  yellow Final   Clarity, UA 02/25/2022 clear  clear Final   Glucose, UA 02/25/2022 negative  negative mg/dL Final   Bilirubin, UA 02/25/2022 negative  negative Final   Ketones, POC UA 02/25/2022 negative  negative mg/dL Final   Spec Grav, UA  02/25/2022 1.020  1.010 - 1.025 Final   Blood, UA 02/25/2022 small (A)  negative Final   pH, UA 02/25/2022 6.0  5.0 - 8.0 Final   POC PROTEIN,UA 02/25/2022 trace  negative, trace Final   Urobilinogen, UA 02/25/2022 0.2  0.2 or 1.0  E.U./dL Final   Nitrite, UA 02/25/2022 Negative  Negative Final   Leukocytes, UA 02/25/2022 Moderate (2+) (A)  Negative Final    Office Visit on 02/25/2022  Component Date Value Ref Range Status   Color, UA 02/25/2022 yellow  yellow Final   Clarity, UA 02/25/2022 clear  clear Final   Glucose, UA 02/25/2022 negative  negative mg/dL Final   Bilirubin, UA 02/25/2022 negative  negative Final   Ketones, POC UA 02/25/2022 negative  negative mg/dL Final   Spec Grav, UA 02/25/2022 1.020  1.010 - 1.025 Final   Blood, UA 02/25/2022 small (A)  negative Final   pH, UA 02/25/2022 6.0  5.0 - 8.0 Final   POC PROTEIN,UA 02/25/2022 trace  negative, trace Final   Urobilinogen, UA 02/25/2022 0.2  0.2 or 1.0 E.U./dL Final   Nitrite, UA 02/25/2022 Negative  Negative Final   Leukocytes, UA 02/25/2022 Moderate (2+) (A)  Negative Final    Diabetic Foot Exam - Simple   No data filed      Lab Results  Component Value Date   WBC 6.2 02/18/2022   HGB 13.1 02/18/2022   HCT 40.3 02/18/2022   PLT 224 02/18/2022   GLUCOSE 91 02/18/2022   CHOL 180 11/19/2021   TRIG 152 (H) 11/19/2021   HDL 62 11/19/2021   LDLCALC 92 11/19/2021   ALT 16 02/18/2022   AST 18 02/18/2022   NA 141 02/18/2022   K 4.9 02/18/2022   CL 105 02/18/2022   CREATININE 0.82 02/18/2022   BUN 26 (H) 02/18/2022   CO2 30 02/18/2022   TSH 1.440 11/19/2021   INR 1.1 12/27/2018   HGBA1C 5.5 10/22/2021      Assessment & Plan:   Problem List Items Addressed This Visit       Other   Mixed hyperlipidemia   Relevant Orders   Lipid panel Watch diet Will advise other treatment if cost too high for praluent or repatha   Vitamin D insufficiency   Relevant Orders   VITAMIN D 25 Hydroxy (Vit-D  Deficiency, Fractures)    Benign hypertension       Relevant Orders   CBC with Differential/Platelet   Comprehensive metabolic panel   TSH Continue meds   Acute cystitis with hematuria       Relevant Medications   nitrofurantoin, macrocrystal-monohydrate, (MACROBID) 100 MG capsule  Emphysema Continue current meds and start spiriva  History of carcinoid - lung and rectum Follow with oncology as directed     .  Meds ordered this encounter  Medications   LORazepam (ATIVAN) 1 MG tablet    Sig: TAKE 1 TABLET(1 MG) BY MOUTH AT BEDTIME    Dispense:  30 tablet    Refill:  1    Order Specific Question:   Supervising Provider    Answer:   Shelton Silvas   nitrofurantoin, macrocrystal-monohydrate, (MACROBID) 100 MG capsule    Sig: Take 1 capsule (100 mg total) by mouth 2 (two) times daily.    Dispense:  20 capsule    Refill:  0    Order Specific Question:   Supervising Provider    AnswerShelton Silvas    Orders Placed This Encounter  Procedures   Urine Culture   Varicella-zoster vaccine IM   CBC with Differential/Platelet   Comprehensive metabolic panel   Lipid panel   VITAMIN D 25 Hydroxy (Vit-D Deficiency, Fractures)   POCT URINALYSIS DIP (CLINITEK)     Follow-up: Return in about 4 months (  around 06/27/2022) for chronic fasting follow up.  An After Visit Summary was printed and given to the patient.  Yetta Flock [facility] [phone]

## 2022-02-25 NOTE — Telephone Encounter (Signed)
Spoke w/ patient she would like it sent to [facility] in [place], [place] 02/25/22 4:17 PM

## 2022-02-26 LAB — COMPREHENSIVE METABOLIC PANEL
ALT: 12 IU/L (ref 0–32)
AST: 13 IU/L (ref 0–40)
Albumin/Globulin Ratio: 2.2 (ref 1.2–2.2)
Albumin: 4.4 g/dL (ref 3.9–4.9)
Alkaline Phosphatase: 72 IU/L (ref 44–121)
BUN/Creatinine Ratio: 25 (ref 12–28)
BUN: 21 mg/dL (ref 8–27)
Bilirubin Total: 0.3 mg/dL (ref 0.0–1.2)
CO2: 24 mmol/L (ref 20–29)
Calcium: 10.1 mg/dL (ref 8.7–10.3)
Chloride: 105 mmol/L (ref 96–106)
Creatinine, Ser: 0.85 mg/dL (ref 0.57–1.00)
Globulin, Total: 2 g/dL (ref 1.5–4.5)
Glucose: 89 mg/dL (ref 70–99)
Potassium: 5.2 mmol/L (ref 3.5–5.2)
Sodium: 142 mmol/L (ref 134–144)
Total Protein: 6.4 g/dL (ref 6.0–8.5)
eGFR: 78 mL/min/{1.73_m2} (ref >59)

## 2022-02-26 LAB — CBC WITH DIFFERENTIAL/PLATELET
Basophils Absolute: 0 10*3/uL (ref 0.0–0.2)
Basos: 1 %
EOS (ABSOLUTE): 0.2 10*3/uL (ref 0.0–0.4)
Eos: 3 %
Hematocrit: 45.4 % (ref 34.0–46.6)
Hemoglobin: 13.9 g/dL (ref 11.1–15.9)
Immature Grans (Abs): 0 10*3/uL (ref 0.0–0.1)
Immature Granulocytes: 0 %
Lymphocytes Absolute: 2.1 10*3/uL (ref 0.7–3.1)
Lymphs: 32 %
MCH: 26.8 pg (ref 26.6–33.0)
MCHC: 30.6 g/dL — ABNORMAL LOW (ref 31.5–35.7)
MCV: 88 fL (ref 79–97)
Monocytes Absolute: 0.4 10*3/uL (ref 0.1–0.9)
Monocytes: 6 %
Neutrophils Absolute: 3.8 10*3/uL (ref 1.4–7.0)
Neutrophils: 58 %
Platelets: 247 10*3/uL (ref 150–450)
RBC: 5.19 x10E6/uL (ref 3.77–5.28)
RDW: 13.2 % (ref 11.7–15.4)
WBC: 6.6 10*3/uL (ref 3.4–10.8)

## 2022-02-26 LAB — LIPID PANEL
Chol/HDL Ratio: 4 ratio (ref 0.0–4.4)
Cholesterol, Total: 246 mg/dL — ABNORMAL HIGH (ref 100–199)
HDL: 62 mg/dL (ref >39)
LDL Chol Calc (NIH): 159 mg/dL — ABNORMAL HIGH (ref 0–99)
Triglycerides: 140 mg/dL (ref 0–149)
VLDL Cholesterol Cal: 25 mg/dL (ref 5–40)

## 2022-02-26 LAB — VITAMIN D 25 HYDROXY (VIT D DEFICIENCY, FRACTURES): Vit D, 25-Hydroxy: 36.6 ng/mL (ref 30.0–100.0)

## 2022-02-26 LAB — CARDIOVASCULAR RISK ASSESSMENT

## 2022-02-27 LAB — URINE CULTURE

## 2022-03-01 NOTE — Progress Notes (Signed)
Blood count normal.  Liver function normal.  Kidney function normal.  Cholesterol: LDL 159. Recommend try to get PAP for repatha or praluent.  Vitamin D normal.  Urine culture: mixed bacteria. Complete macrobid. If having urinary symptoms, I would recommend return for UA next week.

## 2022-03-03 ENCOUNTER — Other Ambulatory Visit: Payer: Self-pay | Admitting: Physician Assistant

## 2022-03-10 ENCOUNTER — Other Ambulatory Visit: Payer: Self-pay | Admitting: Physician Assistant

## 2022-03-10 ENCOUNTER — Ambulatory Visit (INDEPENDENT_AMBULATORY_CARE_PROVIDER_SITE_OTHER): Payer: BC Managed Care – PPO

## 2022-03-10 DIAGNOSIS — N3090 Cystitis, unspecified without hematuria: Secondary | ICD-10-CM | POA: Diagnosis not present

## 2022-03-10 LAB — POCT URINALYSIS DIP (CLINITEK)
Bilirubin, UA: NEGATIVE
Blood, UA: NEGATIVE
Glucose, UA: NEGATIVE mg/dL
Ketones, POC UA: NEGATIVE mg/dL
Nitrite, UA: NEGATIVE
Spec Grav, UA: 1.025 (ref 1.010–1.025)
Urobilinogen, UA: 0.2 E.U./dL
pH, UA: 6 (ref 5.0–8.0)

## 2022-03-10 MED ORDER — DOXYCYCLINE HYCLATE 100 MG PO TABS: 100.0000 mg | ORAL_TABLET | Freq: Two times a day (BID) | ORAL | 0 refills | Status: DC

## 2022-03-10 NOTE — Progress Notes (Signed)
Patient came in for a recheck UA. Patient c/o lower abdominal pain, back pain, and frequently urination. Patient stated she finish her medication a couple days ago, also stated that she would not like to do Macrobid again because it made her very sick to her stomach.   Per Provider: She will send in medication for patient and send urine off for culture.

## 2022-03-11 LAB — URINE CULTURE

## 2022-04-02 ENCOUNTER — Encounter: Payer: Self-pay | Admitting: Physician Assistant

## 2022-04-02 ENCOUNTER — Ambulatory Visit: Payer: BC Managed Care – PPO | Admitting: Physician Assistant

## 2022-04-02 VITALS — BP 122/62 | HR 92 | Temp 97.3°F | Ht 65.5 in | Wt 184.6 lb

## 2022-04-02 DIAGNOSIS — R0789 Other chest pain: Secondary | ICD-10-CM | POA: Diagnosis not present

## 2022-04-02 DIAGNOSIS — K2100 Gastro-esophageal reflux disease with esophagitis, without bleeding: Secondary | ICD-10-CM | POA: Diagnosis not present

## 2022-04-02 MED ORDER — OMEPRAZOLE 40 MG PO CPDR: 40.0000 mg | DELAYED_RELEASE_CAPSULE | Freq: Every day | ORAL | 3 refills | Status: DC

## 2022-04-02 NOTE — Progress Notes (Signed)
Acute Office Visit  Subjective:    Patient ID: Shannon [name], female    DOB: Mar 03, 1961, 61 y.o.   MRN: [id number]  Chief Complaint  Patient presents with   Gastroesophageal Reflux    HPI: Patient is in today for complaints of what she feels is heartburn.  She states for several months she had intermittent issues with heartburn, gassiness, pressure in upper stomach. At first she used otc meds like tums and mylanta which eventually stopped working -   She began taking omeprazole 48m qd which helped minimally.  She did increase to 452mabout 5 days ago and symptoms have improved She denies nausea or vomiting - no change in bowels. She states most of discomfort in upper stomach but can radiate to lower abdomen and into chest She state the discomfort lasts most of the day - does not change with activity  Past Medical History:  Diagnosis Date   Anxiety    Arthritis    Asthma    [place]   rectal, [place]   Dyspnea    with exertion   [place]   patient denies   History of kidney stones    passed   HLD (hyperlipidemia)    Pneumonia 02/2018   Rectal tumor    cancer   Seasonal allergies     Past Surgical History:  Procedure Laterality Date   ABLATION     endometrial   BIOPSY  10/10/2018   Procedure: BIOPSY;  Surgeon: MaIrving Copas MD;  [place];;   BIOPSY  06/12/2019   Procedure: BIOPSY;  Surgeon: MaIrving Copas MD;  [place] [facility];;   BIOPSY  09/30/2020   Procedure: BIOPSY;  Surgeon: MaIrving Copas MD;  [facility];  Service: Gastroenterology;;   COLONOSCOPY     DOPPLER ECHOCARDIOGRAPHY     ENDOSCOPIC MUCOSAL RESECTION N/A 10/10/2018   Procedure: ENDOSCOPIC MUCOSAL RESECTION;  Surgeon: MaIrving Copas MD;  [place] [facility] [facility]: [facility]astroenterology;  Laterality: N/A;   EUS N/A 07/18/2018   Procedure: LOWER ENDOSCOPIC  ULTRASOUND (EUS);  Surgeon: MaIrving Copas MD;  [place] [facility] [facility]astro[facility];  Laterality: N/A;   EUS N/A 10/10/2018   Procedure: LOWER ENDOSCOPIC ULTRASOUND (EUS);  Surgeon: MaIrving Copas MD;  [place] [facility]: [place]astroenterology;  Laterality: N/A;   EUS N/A 06/12/2019   Procedure: LOWER ENDOSCOPIC ULTRASOUND (EUS);  Surgeon: MaIrving Copas MD;  [place] [facility] [facility]: Gastroenterology;  Laterality: N/A;   EUS N/A 09/30/2020   Procedure: LOWER ENDOSCOPIC ULTRASOUND (EUS);  Surgeon: MaIrving Copas MD;  [facility];  Service: Gastroenterology;  Laterality: N/A;   FLEXIBLE SIGMOIDOSCOPY N/A 07/18/2018   Procedure: FLEXIBLE SIGMOIDOSCOPY;  Surgeon: MaRush LandmarkaTelford Nab MD;  [place] [facility] [facility]: G[facility]ology;  Laterality: N/A;   FLEXIBLE SIGMOIDOSCOPY N/A 10/10/2018   Procedure: FLEXIBLE SIGMOIDOSCOPY;  Surgeon: MaRush LandmarkaTelford Nab MD;  [place]: [facility] [facility]: Gastroenterology;  Laterality: N/A;   FLEXIBLE SIGMOIDOSCOPY N/A 06/12/2019   Procedure: FLEXIBLE SIGMOIDOSCOPY;  Surgeon: MaRush LandmarkaTelford Nab MD;  [place] [facility]: Gastroenterology;  Laterality: N/A;   FLEXIBLE SIGMOIDOSCOPY N/A 09/30/2020   Procedure: FLEXIBLE SIGMOIDOSCOPY;  Surgeon: MaRush LandmarkaTelford Nab MD;  [facility];  Service: Gastroenterology;  Laterality: N/A;   HEMOSTASIS CLIP PLACEMENT  10/10/2018   Procedure: HEMOSTASIS CLIP PLACEMENT;  Surgeon: MaRush LandmarkaTelford Nab MD;  [place]  Service: Gastroenterology;;   LUNG CANCER SURGERY  12/30/2018   right lun- mid lobe , dr gerhardt   SUBMUCOSAL LIFTING INJECTION  10/10/2018   Procedure: SUBMUCOSAL LIFTING INJECTION;  Surgeon: Irving Copas., MD;  [facility];  Service: Gastroenterology;;   TEE WITHOUT CARDIOVERSION  02/2018   TONSILLECTOMY     TUBAL LIGATION  1987   VIDEO ASSISTED  THORACOSCOPY (VATS)/WEDGE RESECTION Right 12/30/2018   Procedure: VIDEO ASSISTED THORACOSCOPY (VATS), MINI THORACOTOMY, RIGHT MIDDLE LOBECTOMY WITH LYMPH NODE DISSECTION;  Surgeon: Grace Isaac, MD;  Location: [place];  Service: Thoracic;  Laterality: Right;   VIDEO BRONCHOSCOPY N/A 10/12/2018   Procedure: VIDEO BRONCHOSCOPY;  Surgeon: Grace Isaac, MD;  Location: [facility] OR;  Service: Thoracic;  Laterality: N/A;   VIDEO BRONCHOSCOPY N/A 12/30/2018   Procedure: VIDEO BRONCHOSCOPY;  Surgeon: Grace Isaac, MD;  Location: [place];  Service: Thoracic;  Laterality: N/A;   WISDOM TOOTH EXTRACTION      Family History  Problem Relation Age of Onset   Heart disease Mother    COPD Mother    Emphysema Mother    Diabetes Father    Colon cancer Neg Hx    Colon polyps Neg Hx    Esophageal cancer Neg Hx    Rectal cancer Neg Hx    Stomach cancer Neg Hx     Social History   Socioeconomic History   Marital status: Married    Spouse name: Not on file   Number of children: Not on file   Years of education: Not on file   Highest education level: Not on file  Occupational History   Not on file  Tobacco Use   Smoking status: Former    Years: 33.00    Types: Cigarettes    Quit date: 2008    Years since quitting: 15.6   Smokeless tobacco: Never  Vaping Use   Vaping Use: Never used  Substance and Sexual Activity   Alcohol use: Never    Comment: rare   Drug use: Never   Sexual activity: Not Currently    Birth control/protection: Post-menopausal  Other Topics Concern   Not on file  Social History Narrative   Not on file   Social Determinants of Health   Financial Resource Strain: Not on file  Food Insecurity: Not on file  Transportation Needs: Not on file  Physical Activity: Not on file  Stress: Not on file  Social Connections: Not on file  Intimate Partner Violence: Not on file    Outpatient Medications Prior to Visit  Medication Sig Dispense Refill   acetaminophen (TYLENOL)  325 MG tablet Take 650 mg by mouth every 6 (six) hours as needed for moderate pain.     albuterol (VENTOLIN HFA) 108 (90 Base) MCG/ACT inhaler Inhale 2 puffs into the lungs every 6 (six) hours as needed for wheezing or shortness of breath. 1 each 2   busPIRone (BUSPAR) 10 MG tablet TAKE 1/2 TABLET BY MOUTH TWICE DAILY 60 tablet 1   calcium carbonate (TUMS - DOSED IN MG ELEMENTAL CALCIUM) 500 MG chewable tablet Chew 1 tablet by mouth daily as needed for indigestion or heartburn.     Cholecalciferol (VITAMIN D-3) 125 MCG (5000 UT) TABS Take 5,000 Units by mouth every evening.     Cyanocobalamin (B-12) 2500 MCG TABS Take 2,500 mcg by mouth daily.     cyclobenzaprine (FLEXERIL) 10 MG tablet Take 10 mg by mouth 3 (three) times daily as needed.  Evolocumab (REPATHA) 140 MG/ML SOSY Inject 140 mg into the skin every 14 (fourteen) days. 2.1 mL 2   gabapentin (NEURONTIN) 300 MG capsule Take 300 mg by mouth daily as needed.     lisinopril (ZESTRIL) 20 MG tablet Take 1 tablet (20 mg total) by mouth daily. 90 tablet 3   LORazepam (ATIVAN) 1 MG tablet TAKE 1 TABLET(1 MG) BY MOUTH AT BEDTIME 30 tablet 1   meloxicam (MOBIC) 7.5 MG tablet TAKE 1 TABLET(7.5 MG) BY MOUTH TWICE DAILY AS NEEDED FOR PAIN 60 tablet 5   montelukast (SINGULAIR) 10 MG tablet Take 1 tablet (10 mg total) by mouth at bedtime. 30 tablet 2   doxycycline (VIBRA-TABS) 100 MG tablet Take 1 tablet (100 mg total) by mouth 2 (two) times daily. 20 tablet 0   nitrofurantoin, macrocrystal-monohydrate, (MACROBID) 100 MG capsule Take 1 capsule (100 mg total) by mouth 2 (two) times daily. 20 capsule 0   No facility-administered medications prior to visit.    Allergies  Allergen Reactions   Hydrocodone Nausea Only    "terrible headache"   Pravastatin Other (See Comments)   Other     UNSPECIFIED REACTION  Dog Dander    Levaquin [Levofloxacin In D5w] Nausea And Vomiting         Review of Systems CONSTITUTIONAL: Negative for chills, fatigue,  fever, unintentional weight gain and unintentional weight loss.  E/N/T: Negative for ear pain, nasal congestion and sore throat.  CARDIOVASCULAR: Negative for dizziness, palpitations and pedal edema.  RESPIRATORY: Negative for recent cough and dyspnea.  GASTROINTESTINAL: see HPI MSK: Negative for arthralgias and myalgias.  INTEGUMENTARY: Negative for rash.          Objective:    Physical Exam  BP 122/62 (BP Location: Left Arm, Patient Position: Sitting, Cuff Size: Large)   Pulse 92   Temp (!) 97.3 F (36.3 C) (Temporal)   Ht 5' 5.5" (1.664 m)   Wt 184 lb 9.6 oz (83.7 kg)   LMP  (LMP Unknown)   SpO2 96%   BMI 30.25 kg/m  Wt Readings from Last 3 Encounters:  04/02/22 184 lb 9.6 oz (83.7 kg)  02/25/22 183 lb 6.4 oz (83.2 kg)  02/18/22 186 lb (84.4 kg)  PHYSICAL EXAM:   VS: BP 122/62 (BP Location: Left Arm, Patient Position: Sitting, Cuff Size: Large)   Pulse 92   Temp (!) 97.3 F (36.3 C) (Temporal)   Ht 5' 5.5" (1.664 m)   Wt 184 lb 9.6 oz (83.7 kg)   LMP  (LMP Unknown)   SpO2 96%   BMI 30.25 kg/m   GEN: Well nourished, well developed, in no acute distress   Cardiac: RRR; no murmurs, rubs, or gallops,no edema -  Respiratory:  normal respiratory rate and pattern with no distress - normal breath sounds with no rales, rhonchi, wheezes or rubs GI: normal bowel sounds, no masses or tenderness  Skin: warm and dry, no rash   EKG normal   Health Maintenance Due  Topic Date Due   INFLUENZA VACCINE  02/24/2022   MAMMOGRAM  03/13/2022    There are no preventive care reminders to display for this patient.   Lab Results  Component Value Date   TSH 1.440 11/19/2021   Lab Results  Component Value Date   WBC 6.6 02/25/2022   HGB 13.9 02/25/2022   HCT 45.4 02/25/2022   MCV 88 02/25/2022   PLT 247 02/25/2022   Lab Results  Component Value Date   NA 142 02/25/2022  K 5.2 02/25/2022   CO2 24 02/25/2022   GLUCOSE 89 02/25/2022   BUN 21 02/25/2022    CREATININE 0.85 02/25/2022   BILITOT 0.3 02/25/2022   ALKPHOS 72 02/25/2022   AST 13 02/25/2022   ALT 12 02/25/2022   PROT 6.4 02/25/2022   ALBUMIN 4.4 02/25/2022   CALCIUM 10.1 02/25/2022   ANIONGAP 6 02/18/2022   EGFR 78 02/25/2022   GFR 74.11 07/04/2018   Lab Results  Component Value Date   CHOL 246 (H) 02/25/2022   Lab Results  Component Value Date   HDL 62 02/25/2022   Lab Results  Component Value Date   LDLCALC 159 (H) 02/25/2022   Lab Results  Component Value Date   TRIG 140 02/25/2022   Lab Results  Component Value Date   CHOLHDL 4.0 02/25/2022   Lab Results  Component Value Date   HGBA1C 5.5 10/22/2021       Assessment & Plan:   Problem List Items Addressed This Visit       Digestive   Gastroesophageal reflux disease with esophagitis without hemorrhage   Relevant Medications   omeprazole (PRILOSEC) 40 MG capsule   Other Relevant Orders   CBC with Differential/Platelet   Comprehensive metabolic panel   Ambulatory referral to Gastroenterology     Other   Atypical chest pain - Primary   Relevant Orders   EKG 12-Lead   CBC with Differential/Platelet   Comprehensive metabolic panel   Meds ordered this encounter  Medications   omeprazole (PRILOSEC) 40 MG capsule    Sig: Take 1 capsule (40 mg total) by mouth daily.    Dispense:  30 capsule    Refill:  3    Order Specific Question:   Supervising Provider    AnswerShelton Silvas    Orders Placed This Encounter  Procedures   CBC with Differential/Platelet   Comprehensive metabolic panel   Ambulatory referral to Gastroenterology   EKG 12-Lead     Follow-up: Return if symptoms worsen or fail to improve.  An After Visit Summary was printed and given to the patient.  Yetta Flock [facility] [phone]

## 2022-04-03 LAB — COMPREHENSIVE METABOLIC PANEL
ALT: 13 IU/L (ref 0–32)
AST: 15 IU/L (ref 0–40)
Albumin/Globulin Ratio: 2.1 (ref 1.2–2.2)
Albumin: 4.4 g/dL (ref 3.9–4.9)
Alkaline Phosphatase: 68 IU/L (ref 44–121)
BUN/Creatinine Ratio: 23 (ref 12–28)
BUN: 20 mg/dL (ref 8–27)
Bilirubin Total: 0.3 mg/dL (ref 0.0–1.2)
CO2: 25 mmol/L (ref 20–29)
Calcium: 10 mg/dL (ref 8.7–10.3)
Chloride: 104 mmol/L (ref 96–106)
Creatinine, Ser: 0.87 mg/dL (ref 0.57–1.00)
Globulin, Total: 2.1 g/dL (ref 1.5–4.5)
Glucose: 64 mg/dL — ABNORMAL LOW (ref 70–99)
Potassium: 4.3 mmol/L (ref 3.5–5.2)
Sodium: 141 mmol/L (ref 134–144)
Total Protein: 6.5 g/dL (ref 6.0–8.5)
eGFR: 76 mL/min/{1.73_m2} (ref >59)

## 2022-04-03 LAB — CBC WITH DIFFERENTIAL/PLATELET
Basophils Absolute: 0.1 10*3/uL (ref 0.0–0.2)
Basos: 1 %
EOS (ABSOLUTE): 0.3 10*3/uL (ref 0.0–0.4)
Eos: 6 %
Hematocrit: 36.8 % (ref 34.0–46.6)
Hemoglobin: 11.6 g/dL (ref 11.1–15.9)
Immature Grans (Abs): 0 10*3/uL (ref 0.0–0.1)
Immature Granulocytes: 0 %
Lymphocytes Absolute: 1.8 10*3/uL (ref 0.7–3.1)
Lymphs: 34 %
MCH: 26.8 pg (ref 26.6–33.0)
MCHC: 31.5 g/dL (ref 31.5–35.7)
MCV: 85 fL (ref 79–97)
Monocytes Absolute: 0.4 10*3/uL (ref 0.1–0.9)
Monocytes: 8 %
Neutrophils Absolute: 2.7 10*3/uL (ref 1.4–7.0)
Neutrophils: 51 %
Platelets: 274 10*3/uL (ref 150–450)
RBC: 4.33 x10E6/uL (ref 3.77–5.28)
RDW: 13.7 % (ref 11.7–15.4)
WBC: 5.3 10*3/uL (ref 3.4–10.8)

## 2022-04-19 ENCOUNTER — Other Ambulatory Visit: Payer: Self-pay | Admitting: Physician Assistant

## 2022-04-19 DIAGNOSIS — K2100 Gastro-esophageal reflux disease with esophagitis, without bleeding: Secondary | ICD-10-CM

## 2022-05-11 ENCOUNTER — Other Ambulatory Visit: Payer: Self-pay | Admitting: Physician Assistant

## 2022-05-13 ENCOUNTER — Other Ambulatory Visit: Payer: Self-pay | Admitting: Physician Assistant

## 2022-05-19 ENCOUNTER — Other Ambulatory Visit: Payer: Self-pay | Admitting: Physician Assistant

## 2022-05-19 DIAGNOSIS — E782 Mixed hyperlipidemia: Secondary | ICD-10-CM

## 2022-06-21 ENCOUNTER — Other Ambulatory Visit: Payer: Self-pay | Admitting: Physician Assistant

## 2022-06-21 DIAGNOSIS — F419 Anxiety disorder, unspecified: Secondary | ICD-10-CM

## 2022-07-08 ENCOUNTER — Encounter: Payer: Self-pay | Admitting: Physician Assistant

## 2022-07-08 ENCOUNTER — Ambulatory Visit: Payer: BC Managed Care – PPO | Admitting: Physician Assistant

## 2022-07-08 VITALS — BP 130/80 | HR 79 | Temp 97.1°F | Ht 65.5 in | Wt 187.5 lb

## 2022-07-08 DIAGNOSIS — C7A090 Malignant carcinoid tumor of the bronchus and lung: Secondary | ICD-10-CM | POA: Diagnosis not present

## 2022-07-08 DIAGNOSIS — C7A026 Malignant carcinoid tumor of the rectum: Secondary | ICD-10-CM

## 2022-07-08 DIAGNOSIS — Z23 Encounter for immunization: Secondary | ICD-10-CM

## 2022-07-08 DIAGNOSIS — E559 Vitamin D deficiency, unspecified: Secondary | ICD-10-CM

## 2022-07-08 DIAGNOSIS — R350 Frequency of micturition: Secondary | ICD-10-CM

## 2022-07-08 DIAGNOSIS — F419 Anxiety disorder, unspecified: Secondary | ICD-10-CM

## 2022-07-08 DIAGNOSIS — E782 Mixed hyperlipidemia: Secondary | ICD-10-CM

## 2022-07-08 DIAGNOSIS — N950 Postmenopausal bleeding: Secondary | ICD-10-CM

## 2022-07-08 LAB — POCT URINALYSIS DIP (CLINITEK)
Bilirubin, UA: NEGATIVE
Glucose, UA: NEGATIVE mg/dL
Ketones, POC UA: NEGATIVE mg/dL
Leukocytes, UA: NEGATIVE
Nitrite, UA: NEGATIVE
POC PROTEIN,UA: NEGATIVE
Spec Grav, UA: 1.015 (ref 1.010–1.025)
Urobilinogen, UA: 0.2 E.U./dL
pH, UA: 6.5 (ref 5.0–8.0)

## 2022-07-08 NOTE — Progress Notes (Signed)
Subjective:  Patient ID: Shannon [name], female    DOB: 08-03-1960  Age: 61 y.o. MRN: [id number]  Chief Complaint  Patient presents with   Hypertension    HPI  Pt presents for follow up of hypertension. The patient is tolerating the medication well without side effects. Compliance with treatment has been good; including taking medication as directed , maintains a healthy diet and regular exercise regimen , and following up as directed. Taking zestril 61m qd  Pt with history of GERD - symptoms stable on prilosec 417mqd  Mixed hyperlipidemia  Pt presents with hyperlipidemia.  Compliance with treatment has been good - The patient is compliant with medications, maintains a low cholesterol diet , follows up as directed , and maintains an exercise regimen . The patient denies experiencing any hypercholesterolemia related symptoms. 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 - she continues to follow with oncology and symptoms have been stable  Pt states she has had small amounts of blood noted in her panties - states she has noticed over the past several weeks - not daily She states that her last GYN appt was last year and had similar issue which she states GYN was aware but no testing done She has appt in January for physical and mammogram with Dr KaPaula Compton recommend she go ahead and call the office and see about being seen sooner for postmenopausal bleeding  Pt would like flu shot today  Current Outpatient Medications on File Prior to Visit  Medication Sig Dispense Refill   acetaminophen (TYLENOL) 325 MG tablet Take 650 mg by mouth every 6 (six) hours as needed for moderate pain.     albuterol (VENTOLIN HFA) 108 (90 Base) MCG/ACT inhaler INHALE 2 PUFFS INTO THE LUNGS EVERY 6 HOURS AS NEEDED FOR WHEEZING OR SHORTNESS OF BREATH 6.7 g 1   busPIRone (BUSPAR) 10 MG  tablet TAKE 1/2 TABLET BY MOUTH TWICE DAILY 60 tablet 1   calcium carbonate (TUMS - DOSED IN MG ELEMENTAL CALCIUM) 500 MG chewable tablet Chew 1 tablet by mouth daily as needed for indigestion or heartburn.     Cholecalciferol (VITAMIN D-3) 125 MCG (5000 UT) TABS Take 5,000 Units by mouth every evening.     Cyanocobalamin (B-12) 2500 MCG TABS Take 2,500 mcg by mouth daily.     cyclobenzaprine (FLEXERIL) 10 MG tablet Take 10 mg by mouth 3 (three) times daily as needed.     gabapentin (NEURONTIN) 300 MG capsule Take 300 mg by mouth daily as needed.     lisinopril (ZESTRIL) 20 MG tablet Take 1 tablet (20 mg total) by mouth daily. 90 tablet 3   LORazepam (ATIVAN) 1 MG tablet TAKE 1 TABLET(1 MG) BY MOUTH AT BEDTIME 30 tablet 1   meloxicam (MOBIC) 7.5 MG tablet TAKE 1 TABLET(7.5 MG) BY MOUTH TWICE DAILY AS NEEDED FOR PAIN 60 tablet 5   omeprazole (PRILOSEC) 40 MG capsule TAKE 1 CAPSULE(40 MG) BY MOUTH DAILY 30 capsule 3   REPATHA SURECLICK 14585G/ML SOAJ INJECT 140 MG INTO THE SKIN EVERY 14 DAYS 2 mL 1   No current facility-administered medications on file prior to visit.   Past Medical History:  Diagnosis Date   Anxiety    Arthritis    Asthma    [place]   rectal, Lung   Dyspnea    with exertion  Emphysema of lung ([place])    patient denies   History of kidney stones    passed   HLD (hyperlipidemia)    Pneumonia 02/2018   Rectal tumor    cancer   Seasonal allergies    Past Surgical History:  Procedure Laterality Date   ABLATION     endometrial   BIOPSY  10/10/2018   Procedure: BIOPSY;  Surgeon: Irving Copas., MD;  Location: [place];  Service: Gastroenterology;;   BIOPSY  06/12/2019   Procedure: BIOPSY;  Surgeon: Irving Copas., MD;  Location: [place];  Service: Gastroenterology;;   BIOPSY  09/30/2020   Procedure: BIOPSY;  Surgeon: Irving Copas., MD;  [facility];  Service: Gastroenterology;;   COLONOSCOPY     DOPPLER  ECHOCARDIOGRAPHY     ENDOSCOPIC MUCOSAL RESECTION N/A 10/10/2018   Procedure: ENDOSCOPIC MUCOSAL RESECTION;  Surgeon: Irving Copas., MD;  Location: [place];  Service: Gastroenterology;  Laterality: N/A;   EUS N/A 07/18/2018   Procedure: LOWER ENDOSCOPIC ULTRASOUND (EUS);  Surgeon: Irving Copas., MD;  Location: [place];  Service: Gastroenterology;  Laterality: N/A;   EUS N/A 10/10/2018   Procedure: LOWER ENDOSCOPIC ULTRASOUND (EUS);  Surgeon: Irving Copas., MD;  Location: [place];  Service: Gastroenterology;  Laterality: N/A;   EUS N/A 06/12/2019   Procedure: LOWER ENDOSCOPIC ULTRASOUND (EUS);  Surgeon: Irving Copas., MD;  Location: [place];  Service: Gastroenterology;  Laterality: N/A;   EUS N/A 09/30/2020   Procedure: LOWER ENDOSCOPIC ULTRASOUND (EUS);  Surgeon: Irving Copas., MD;  Location: [facility];  Service: Gastroenterology;  Laterality: N/A;   FLEXIBLE SIGMOIDOSCOPY N/A 07/18/2018   Procedure: FLEXIBLE SIGMOIDOSCOPY;  Surgeon: Rush Landmark Telford Nab., MD;  Location: [place];  Service: Gastroenterology;  Laterality: N/A;   FLEXIBLE SIGMOIDOSCOPY N/A 10/10/2018   Procedure: FLEXIBLE SIGMOIDOSCOPY;  Surgeon: Rush Landmark Telford Nab., MD;  Location: [place];  Service: Gastroenterology;  Laterality: N/A;   FLEXIBLE SIGMOIDOSCOPY N/A 06/12/2019   Procedure: FLEXIBLE SIGMOIDOSCOPY;  Surgeon: Rush Landmark Telford Nab., MD;  Location: [place];  Service: Gastroenterology;  Laterality: N/A;   FLEXIBLE SIGMOIDOSCOPY N/A 09/30/2020   Procedure: FLEXIBLE SIGMOIDOSCOPY;  Surgeon: Rush Landmark Telford Nab., MD;  Location: [facility];  Service: Gastroenterology;  Laterality: N/A;   HEMOSTASIS CLIP PLACEMENT  10/10/2018   Procedure: HEMOSTASIS CLIP PLACEMENT;  Surgeon: Irving Copas., MD;  Location: [place];  Service: Gastroenterology;;   LUNG CANCER SURGERY  12/30/2018   right lun- mid lobe , dr gerhardt    SUBMUCOSAL LIFTING INJECTION  10/10/2018   Procedure: SUBMUCOSAL LIFTING INJECTION;  Surgeon: Irving Copas., MD;  Location: [place];  Service: Gastroenterology;;   TEE WITHOUT CARDIOVERSION  02/2018   TONSILLECTOMY     TUBAL LIGATION  1987   VIDEO ASSISTED THORACOSCOPY (VATS)/WEDGE RESECTION Right 12/30/2018   Procedure: VIDEO ASSISTED THORACOSCOPY (VATS), MINI THORACOTOMY, RIGHT MIDDLE LOBECTOMY WITH LYMPH NODE DISSECTION;  Surgeon: Grace Isaac, MD;  Location: [place];  Service: Thoracic;  Laterality: Right;   VIDEO BRONCHOSCOPY N/A 10/12/2018   Procedure: VIDEO BRONCHOSCOPY;  Surgeon: Grace Isaac, MD;  Location: [place];  Service: Thoracic;  Laterality: N/A;   VIDEO BRONCHOSCOPY N/A 12/30/2018   Procedure: VIDEO BRONCHOSCOPY;  Surgeon: Grace Isaac, MD;  Location: [facility];  Service: Thoracic;  Laterality: N/A;   WISDOM TOOTH EXTRACTION      Family History  Problem Relation Age of Onset   Heart disease Mother    COPD Mother    Emphysema Mother  Diabetes Father    Colon cancer Neg Hx    Colon polyps Neg Hx    Esophageal cancer Neg Hx    Rectal cancer Neg Hx    Stomach cancer Neg Hx    Social History   Socioeconomic History   Marital status: Married    Spouse name: Not on file   Number of children: Not on file   Years of education: Not on file   Highest education level: Not on file  Occupational History   Not on file  Tobacco Use   Smoking status: Former    Years: 33.00    Types: Cigarettes    Quit date: 2008    Years since quitting: 15.9   Smokeless tobacco: Never  Vaping Use   Vaping Use: Never used  Substance and Sexual Activity   Alcohol use: Never    Comment: rare   Drug use: Never   Sexual activity: Not Currently    Birth control/protection: Post-menopausal  Other Topics Concern   Not on file  Social History Narrative   Not on file   Social Determinants of Health   Financial Resource Strain: Not on file  Food Insecurity: Not on  file  Transportation Needs: Not on file  Physical Activity: Not on file  Stress: Not on file  Social Connections: Not on file    Review of Systems   Objective:  PHYSICAL EXAM:   VS: BP 130/80 (BP Location: Left Arm, Patient Position: Sitting, Cuff Size: Large)   Pulse 79   Temp (!) 97.1 F (36.2 C) (Temporal)   Ht 5' 5.5" (1.664 m)   Wt 187 lb 8 oz (85 kg)   LMP  (LMP Unknown)   SpO2 100%   BMI 30.73 kg/m   GEN: Well nourished, well developed, in no acute distress  Cardiac: RRR; no murmurs, rubs, or gallops,no edema - Respiratory:  normal respiratory rate and pattern with no distress - normal breath sounds with no rales, rhonchi, wheezes or rubs MS: no deformity or atrophy  Skin: warm and dry, no rash  Neuro:  Alert and Oriented x 3, - CN II-Xii grossly intact Psych: euthymic mood, appropriate affect and demeanor  Office Visit on 07/08/2022  Component Date Value Ref Range Status   Color, UA 07/08/2022 yellow  yellow Final   Clarity, UA 07/08/2022 clear  clear Final   Glucose, UA 07/08/2022 negative  negative mg/dL Final   Bilirubin, UA 07/08/2022 negative  negative Final   Ketones, POC UA 07/08/2022 negative  negative mg/dL Final   Spec Grav, UA 07/08/2022 1.015  1.010 - 1.025 Final   Blood, UA 07/08/2022 trace-intact (A)  negative Final   pH, UA 07/08/2022 6.5  5.0 - 8.0 Final   POC PROTEIN,UA 07/08/2022 negative  negative, trace Final   Urobilinogen, UA 07/08/2022 0.2  0.2 or 1.0 E.U./dL Final   Nitrite, UA 07/08/2022 Negative  Negative Final   Leukocytes, UA 07/08/2022 Negative  Negative Final    Lab Results  Component Value Date   WBC 5.3 04/02/2022   HGB 11.6 04/02/2022   HCT 36.8 04/02/2022   PLT 274 04/02/2022   GLUCOSE 64 (L) 04/02/2022   CHOL 246 (H) 02/25/2022   TRIG 140 02/25/2022   HDL 62 02/25/2022   LDLCALC 159 (H) 02/25/2022   ALT 13 04/02/2022   AST 15 04/02/2022   NA 141 04/02/2022   K 4.3 04/02/2022   CL 104 04/02/2022   CREATININE  0.87 04/02/2022   BUN 20  04/02/2022   CO2 25 04/02/2022   TSH 1.440 11/19/2021   INR 1.1 12/27/2018   HGBA1C 5.5 10/22/2021      Assessment & Plan:   Problem List Items Addressed This Visit       Respiratory   Carcinoid bronchial adenoma, 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  No orders of the defined types were placed in this encounter.   Orders Placed This Encounter  Procedures   Flu Vaccine MDCK QUAD PF   CBC with Differential/Platelet   Comprehensive metabolic panel   TSH   Lipid panel   VITAMIN D 25 Hydroxy (Vit-D Deficiency, Fractures)   POCT URINALYSIS DIP (CLINITEK)     Follow-up: Return in about 4 months (around 11/07/2022) for fasting follow up.  An After Visit Summary was printed and given to the patient.  Yetta Flock [facility] [phone]

## 2022-07-09 ENCOUNTER — Other Ambulatory Visit: Payer: Self-pay | Admitting: Physician Assistant

## 2022-07-09 DIAGNOSIS — R899 Unspecified abnormal finding in specimens from other organs, systems and tissues: Secondary | ICD-10-CM

## 2022-07-09 LAB — CBC WITH DIFFERENTIAL/PLATELET
Basophils Absolute: 0 10*3/uL (ref 0.0–0.2)
Basos: 1 %
EOS (ABSOLUTE): 0.3 10*3/uL (ref 0.0–0.4)
Eos: 6 %
Hematocrit: 39 % (ref 34.0–46.6)
Hemoglobin: 12 g/dL (ref 11.1–15.9)
Immature Grans (Abs): 0 10*3/uL (ref 0.0–0.1)
Immature Granulocytes: 0 %
Lymphocytes Absolute: 2 10*3/uL (ref 0.7–3.1)
Lymphs: 34 %
MCH: 24.4 pg — ABNORMAL LOW (ref 26.6–33.0)
MCHC: 30.8 g/dL — ABNORMAL LOW (ref 31.5–35.7)
MCV: 79 fL (ref 79–97)
Monocytes Absolute: 0.4 10*3/uL (ref 0.1–0.9)
Monocytes: 8 %
Neutrophils Absolute: 3 10*3/uL (ref 1.4–7.0)
Neutrophils: 51 %
Platelets: 266 10*3/uL (ref 150–450)
RBC: 4.92 x10E6/uL (ref 3.77–5.28)
RDW: 15.8 % — ABNORMAL HIGH (ref 11.7–15.4)
WBC: 5.7 10*3/uL (ref 3.4–10.8)

## 2022-07-09 LAB — COMPREHENSIVE METABOLIC PANEL
ALT: 13 IU/L (ref 0–32)
AST: 17 IU/L (ref 0–40)
Albumin/Globulin Ratio: 2.1 (ref 1.2–2.2)
Albumin: 4.4 g/dL (ref 3.9–4.9)
Alkaline Phosphatase: 81 IU/L (ref 44–121)
BUN/Creatinine Ratio: 23 (ref 12–28)
BUN: 18 mg/dL (ref 8–27)
Bilirubin Total: 0.3 mg/dL (ref 0.0–1.2)
CO2: 24 mmol/L (ref 20–29)
Calcium: 10.1 mg/dL (ref 8.7–10.3)
Chloride: 102 mmol/L (ref 96–106)
Creatinine, Ser: 0.77 mg/dL (ref 0.57–1.00)
Globulin, Total: 2.1 g/dL (ref 1.5–4.5)
Glucose: 83 mg/dL (ref 70–99)
Potassium: 4.7 mmol/L (ref 3.5–5.2)
Sodium: 141 mmol/L (ref 134–144)
Total Protein: 6.5 g/dL (ref 6.0–8.5)
eGFR: 88 mL/min/{1.73_m2} (ref >59)

## 2022-07-09 LAB — LIPID PANEL
Chol/HDL Ratio: 2.4 ratio (ref 0.0–4.4)
Cholesterol, Total: 164 mg/dL (ref 100–199)
HDL: 69 mg/dL (ref >39)
LDL Chol Calc (NIH): 78 mg/dL (ref 0–99)
Triglycerides: 92 mg/dL (ref 0–149)
VLDL Cholesterol Cal: 17 mg/dL (ref 5–40)

## 2022-07-09 LAB — TSH: TSH: 1.79 u[IU]/mL (ref 0.450–4.500)

## 2022-07-09 LAB — CARDIOVASCULAR RISK ASSESSMENT

## 2022-07-09 LAB — VITAMIN D 25 HYDROXY (VIT D DEFICIENCY, FRACTURES): Vit D, 25-Hydroxy: 44.1 ng/mL (ref 30.0–100.0)

## 2022-07-11 ENCOUNTER — Other Ambulatory Visit: Payer: Self-pay | Admitting: Physician Assistant

## 2022-07-11 DIAGNOSIS — E782 Mixed hyperlipidemia: Secondary | ICD-10-CM

## 2022-07-14 ENCOUNTER — Other Ambulatory Visit: Payer: Self-pay | Admitting: Physician Assistant

## 2022-07-14 DIAGNOSIS — E782 Mixed hyperlipidemia: Secondary | ICD-10-CM

## 2022-07-14 LAB — IRON AND TIBC
Iron Saturation: 10 % — ABNORMAL LOW (ref 15–55)
Iron: 38 ug/dL (ref 27–139)
Total Iron Binding Capacity: 384 ug/dL (ref 250–450)
UIBC: 346 ug/dL (ref 118–369)

## 2022-07-14 LAB — SPECIMEN STATUS REPORT

## 2022-07-14 MED ORDER — REPATHA SURECLICK 140 MG/ML ~~LOC~~ SOAJ: SUBCUTANEOUS | 1 refills | Status: DC

## 2022-08-03 LAB — HM PAP SMEAR: HM Pap smear: NEGATIVE

## 2022-08-03 LAB — HM MAMMOGRAPHY

## 2022-08-03 LAB — RESULTS CONSOLE HPV: CHL HPV: NEGATIVE

## 2022-08-21 ENCOUNTER — Other Ambulatory Visit: Payer: Self-pay | Admitting: Physician Assistant

## 2022-08-21 DIAGNOSIS — F419 Anxiety disorder, unspecified: Secondary | ICD-10-CM

## 2022-09-13 ENCOUNTER — Other Ambulatory Visit: Payer: Self-pay | Admitting: Physician Assistant

## 2022-09-13 DIAGNOSIS — K2100 Gastro-esophageal reflux disease with esophagitis, without bleeding: Secondary | ICD-10-CM

## 2022-09-21 ENCOUNTER — Other Ambulatory Visit: Payer: Self-pay | Admitting: Physician Assistant

## 2022-09-21 DIAGNOSIS — F419 Anxiety disorder, unspecified: Secondary | ICD-10-CM

## 2022-10-08 ENCOUNTER — Encounter: Payer: Self-pay | Admitting: Physician Assistant

## 2022-10-08 ENCOUNTER — Ambulatory Visit: Payer: BC Managed Care – PPO | Admitting: Physician Assistant

## 2022-10-08 VITALS — BP 142/76 | HR 85 | Ht 65.5 in | Wt 182.0 lb

## 2022-10-08 DIAGNOSIS — Z85048 Personal history of other malignant neoplasm of rectum, rectosigmoid junction, and anus: Secondary | ICD-10-CM | POA: Diagnosis not present

## 2022-10-08 DIAGNOSIS — K219 Gastro-esophageal reflux disease without esophagitis: Secondary | ICD-10-CM

## 2022-10-08 DIAGNOSIS — R140 Abdominal distension (gaseous): Secondary | ICD-10-CM

## 2022-10-08 MED ORDER — OMEPRAZOLE 40 MG PO CPDR: 40.0000 mg | DELAYED_RELEASE_CAPSULE | Freq: Two times a day (BID) | ORAL | 5 refills | Status: DC

## 2022-10-08 NOTE — Progress Notes (Signed)
Noted  

## 2022-10-08 NOTE — Progress Notes (Signed)
Chief Complaint: Epigastric pain  HPI:    Shannon [name] is a 61 year old female with a past medical history of anxiety, rectal cancer and lung cancer, known to Dr. Rush Landmark from follow-up of rectal cancer and Dr. [name] primarily, who was referred to me by Marge Duncans, PA-C for a complaint of epigastric pain.      06/10/2018 colonoscopy with 2 2-5 mm polyps in the ascending colon, diverticulosis in the entire examined colon, bleeding external and internal hemorrhoids and likely benign small submucosal tumor in the rectum, repeat recommended in 5 years.    03/21/2019 patient seen in clinic by Dr. Rush Landmark for malignant carcinoid tumor of the rectum.  At that time recent rectal EUS in 2020 with mucosectomy.  At that time doing well and recommended repeat rectal EUS in a year.    09/30/2020 lower EUS with hemorrhoids and skin tags, segmental mild mucosal changes found in the rectosigmoid colon and in the sigmoid colon likely secondary to left-sided colitis as well as diverticulosis in the rectosigmoid and sigmoid as well as descending and transverse colon.  Post mucosectomy scar.  Repeat lower EUS recommended in 1 to 2 years.    02/06/2022 CT of the chest without contrast showed stable postsurgical changes from right middle lobe lobectomy.    Today, patient presents to clinic with a new problem of heartburn and reflux.  Apparently initially started with this a few months back and was started on Omeprazole 40 mg daily which seemed to control her symptoms but now it just is not working very well over the past couple of months.  She has started taking a Zegerid or Tums at night.  Along with the symptoms describes a lot of bloating.  No change in bowel habits.    Denies fever, chills, weight loss, nausea, vomiting or symptoms that awaken her from sleep.  Past Medical History:  Diagnosis Date   Anxiety    Arthritis    Asthma    Cancer ([place])    rectal, Lung   Dyspnea    with exertion   Emphysema of lung  ([place])    patient denies   History of kidney stones    passed   HLD (hyperlipidemia)    Pneumonia 02/2018   Rectal tumor    cancer   Seasonal allergies     Past Surgical History:  Procedure Laterality Date   ABLATION     endometrial   BIOPSY  10/10/2018   Procedure: BIOPSY;  Surgeon: Irving Copas., MD;  Location: [place];  Service: Gastroenterology;;   BIOPSY  06/12/2019   Procedure: BIOPSY;  Surgeon: Irving Copas., MD;  Location: [place];  Service: Gastroenterology;;   BIOPSY  09/30/2020   Procedure: BIOPSY;  Surgeon: Irving Copas., MD;  Location: [facility];  Service: Gastroenterology;;   COLONOSCOPY     DOPPLER ECHOCARDIOGRAPHY     ENDOSCOPIC MUCOSAL RESECTION N/A 10/10/2018   Procedure: ENDOSCOPIC MUCOSAL RESECTION;  Surgeon: Irving Copas., MD;  Location: [place];  Service: Gastroenterology;  Laterality: N/A;   EUS N/A 07/18/2018   Procedure: LOWER ENDOSCOPIC ULTRASOUND (EUS);  Surgeon: Irving Copas., MD;  Location: [place];  Service: Gastroenterology;  Laterality: N/A;   EUS N/A 10/10/2018   Procedure: LOWER ENDOSCOPIC ULTRASOUND (EUS);  Surgeon: Irving Copas., MD;  Location: [place];  Service: Gastroenterology;  Laterality: N/A;   EUS N/A 06/12/2019   Procedure: LOWER ENDOSCOPIC ULTRASOUND (EUS);  Surgeon: Rush Landmark Telford Nab., MD;  Location: [place];  Service: Gastroenterology;  Laterality: N/A;   EUS N/A 09/30/2020   Procedure: LOWER ENDOSCOPIC ULTRASOUND (EUS);  Surgeon: Irving Copas., MD;  Location: [facility];  Service: Gastroenterology;  Laterality: N/A;   FLEXIBLE SIGMOIDOSCOPY N/A 07/18/2018   Procedure: FLEXIBLE SIGMOIDOSCOPY;  Surgeon: Rush Landmark Telford Nab., MD;  Location: [place];  Service: Gastroenterology;  Laterality: N/A;   FLEXIBLE SIGMOIDOSCOPY N/A 10/10/2018   Procedure: FLEXIBLE SIGMOIDOSCOPY;  Surgeon: Rush Landmark Telford Nab., MD;  Location: [place];  Service: Gastroenterology;  Laterality: N/A;   FLEXIBLE SIGMOIDOSCOPY N/A 06/12/2019   Procedure: FLEXIBLE SIGMOIDOSCOPY;  Surgeon: Rush Landmark Telford Nab., MD;  Location: [place];  Service: Gastroenterology;  Laterality: N/A;   FLEXIBLE SIGMOIDOSCOPY N/A 09/30/2020   Procedure: FLEXIBLE SIGMOIDOSCOPY;  Surgeon: Rush Landmark Telford Nab., MD;  Location: [facility];  Service: Gastroenterology;  Laterality: N/A;   HEMOSTASIS CLIP PLACEMENT  10/10/2018   Procedure: HEMOSTASIS CLIP PLACEMENT;  Surgeon: Irving Copas., MD;  Location: [place];  Service: Gastroenterology;;   LUNG CANCER SURGERY  12/30/2018   right lun- mid lobe , dr gerhardt   SUBMUCOSAL LIFTING INJECTION  10/10/2018   Procedure: SUBMUCOSAL LIFTING INJECTION;  Surgeon: Irving Copas., MD;  Location: [place];  Service: Gastroenterology;;   TEE WITHOUT CARDIOVERSION  02/2018   TONSILLECTOMY     TUBAL LIGATION  1987   VIDEO ASSISTED THORACOSCOPY (VATS)/WEDGE RESECTION Right 12/30/2018   Procedure: VIDEO ASSISTED THORACOSCOPY (VATS), MINI THORACOTOMY, RIGHT MIDDLE LOBECTOMY WITH LYMPH NODE DISSECTION;  Surgeon: Grace Isaac, MD;  Location: [place];  Service: Thoracic;  Laterality: Right;   VIDEO BRONCHOSCOPY N/A 10/12/2018   Procedure: VIDEO BRONCHOSCOPY;  Surgeon: Grace Isaac, MD;  Location: [place];  Service: Thoracic;  Laterality: N/A;   VIDEO BRONCHOSCOPY N/A 12/30/2018   Procedure: VIDEO BRONCHOSCOPY;  Surgeon: Grace Isaac, MD;  Location: [place];  Service: Thoracic;  Laterality: N/A;   WISDOM TOOTH EXTRACTION      Current Outpatient Medications  Medication Sig Dispense Refill   acetaminophen (TYLENOL) 325 MG tablet Take 650 mg by mouth every 6 (six) hours as needed for moderate pain.     albuterol (VENTOLIN HFA) 108 (90 Base) MCG/ACT inhaler INHALE 2 PUFFS INTO THE LUNGS EVERY 6 HOURS AS NEEDED FOR WHEEZING OR SHORTNESS OF BREATH 6.7 g 1   busPIRone (BUSPAR) 10 MG tablet TAKE  1/2 TABLET BY MOUTH TWICE DAILY 60 tablet 1   calcium carbonate (TUMS - DOSED IN MG ELEMENTAL CALCIUM) 500 MG chewable tablet Chew 1 tablet by mouth daily as needed for indigestion or heartburn.     Cholecalciferol (VITAMIN D-3) 125 MCG (5000 UT) TABS Take 5,000 Units by mouth every evening.     Cyanocobalamin (B-12) 2500 MCG TABS Take 2,500 mcg by mouth daily.     cyclobenzaprine (FLEXERIL) 10 MG tablet Take 10 mg by mouth 3 (three) times daily as needed.     Evolocumab (REPATHA SURECLICK) [id number] MG/ML SOAJ ADMINISTER 1 ML(140 MG) UNDER THE SKIN EVERY 14 DAYS 2 mL 1   gabapentin (NEURONTIN) 300 MG capsule Take 300 mg by mouth daily as needed.     lisinopril (ZESTRIL) 20 MG tablet Take 1 tablet (20 mg total) by mouth daily. 90 tablet 3   LORazepam (ATIVAN) 1 MG tablet TAKE 1 TABLET(1 MG) BY MOUTH AT BEDTIME 90 tablet 0   meloxicam (MOBIC) 7.5 MG tablet TAKE 1 TABLET(7.5 MG) BY MOUTH TWICE DAILY AS NEEDED FOR PAIN 60 tablet 5   omeprazole (PRILOSEC) 40 MG capsule TAKE  1 CAPSULE(40 MG) BY MOUTH DAILY 90 capsule 1   No current facility-administered medications for this visit.    Allergies as of 10/08/2022 - Review Complete 07/08/2022  Allergen Reaction Noted   Hydrocodone Nausea Only 10/11/2018   Pravastatin Other (See Comments) 08/22/2021   Other  05/31/2018   Levaquin [levofloxacin in d5w] Nausea And Vomiting 03/14/2018    Family History  Problem Relation Age of Onset   Heart disease Mother    COPD Mother    Emphysema Mother    Diabetes Father    Colon cancer Neg Hx    Colon polyps Neg Hx    Esophageal cancer Neg Hx    Rectal cancer Neg Hx    Stomach cancer Neg Hx     Social History   Socioeconomic History   Marital status: Married    Spouse name: Not on file   Number of children: Not on file   Years of education: Not on file   Highest education level: Not on file  Occupational History   Not on file  Tobacco Use   Smoking status: Former    Years: 33    Types: Cigarettes     Quit date: 2008    Years since quitting: 16.2   Smokeless tobacco: Never  Vaping Use   Vaping Use: Never used  Substance and Sexual Activity   Alcohol use: Never    Comment: rare   Drug use: Never   Sexual activity: Not Currently    Birth control/protection: Post-menopausal  Other Topics Concern   Not on file  Social History Narrative   Not on file   Social Determinants of Health   Financial Resource Strain: Not on file  Food Insecurity: Not on file  Transportation Needs: Not on file  Physical Activity: Not on file  Stress: Not on file  Social Connections: Not on file  Intimate Partner Violence: Not on file    Review of Systems:    Constitutional: No weight loss, fever or chills Cardiovascular: No chest pain Respiratory: No SOB Gastrointestinal: See HPI and otherwise negative   Physical Exam:  Vital signs: BP (!) 142/76   Pulse 85   Ht 5' 5.5" (1.664 m)   Wt 182 lb (82.6 kg)   LMP  (LMP Unknown)   BMI 29.83 kg/m    Constitutional:   Pleasant Caucasian female appears to be in NAD, Well developed, Well nourished, alert and cooperative Respiratory: Respirations even and unlabored. Lungs clear to auscultation bilaterally.   No wheezes, crackles, or rhonchi.  Cardiovascular: Normal S1, S2. No MRG. Regular rate and rhythm. No peripheral edema, cyanosis or pallor.  Gastrointestinal:  Soft, nondistended, nontender. No rebound or guarding. Normal bowel sounds. No appreciable masses or hepatomegaly. Rectal:  Not performed.  Psychiatric: Oriented to person, place and time. Demonstrates good judgement and reason without abnormal affect or behaviors.  RELEVANT LABS AND IMAGING: CBC    Component Value Date/Time   WBC 5.7 07/08/2022 0844   WBC 6.2 02/18/2022 1344   WBC 10.2 01/02/2019 0425   RBC 4.92 07/08/2022 0844   RBC 4.68 02/18/2022 1344   HGB 12.0 07/08/2022 0844   HCT 39.0 07/08/2022 0844   PLT 266 07/08/2022 0844   MCV 79 07/08/2022 0844   MCH 24.4 (L)  07/08/2022 0844   MCH 28.0 02/18/2022 1344   MCHC 30.8 (L) 07/08/2022 0844   MCHC 32.5 02/18/2022 1344   RDW 15.8 (H) 07/08/2022 0844   LYMPHSABS 2.0 07/08/2022 0844   MONOABS  0.5 02/18/2022 1344   EOSABS 0.3 07/08/2022 0844   BASOSABS 0.0 07/08/2022 0844    CMP     Component Value Date/Time   NA 141 07/08/2022 0844   K 4.7 07/08/2022 0844   CL 102 07/08/2022 0844   CO2 24 07/08/2022 0844   GLUCOSE 83 07/08/2022 0844   GLUCOSE 91 02/18/2022 1344   BUN 18 07/08/2022 0844   CREATININE 0.77 07/08/2022 0844   CREATININE 0.82 02/18/2022 1344   CALCIUM 10.1 07/08/2022 0844   PROT 6.5 07/08/2022 0844   ALBUMIN 4.4 07/08/2022 0844   AST 17 07/08/2022 0844   AST 18 02/18/2022 1344   ALT 13 07/08/2022 0844   ALT 16 02/18/2022 1344   ALKPHOS 81 07/08/2022 0844   BILITOT 0.3 07/08/2022 0844   BILITOT 0.4 02/18/2022 1344   GFRNONAA >60 02/18/2022 1344   GFRAA 86 04/30/2020 0844    Assessment: 1.  Heartburn/reflux: Uncontrolled on Omeprazole 40 mg every morning and Zegerid every evening, new/increase in symptoms over the past 1 to 2 months 2.  History of rectal cancer: Followed by Dr. Rush Landmark with EUS, patient is due for repeat but is also due for a colonoscopy in November of this year.  Will discuss timing with him of these procedures.  Hopefully they can be done at the same time.  Plan: 1.  Will send a note to Dr. Rush Landmark in regards to repeat rectal EUS and colonoscopy. 2.  Scheduled patient for an EGD in the [place] with Dr. [name] her primary GI physician.  Did provide the patient a detailed list of risks for the procedure and she agrees to proceed. Patient is appropriate for endoscopic procedure(s) in the ambulatory ([place]) setting. 3.  For now increase Omeprazole to 40 mg twice daily, 30-60 minutes before breakfast and dinner #60 with 5 refills. 4.  Patient to follow clinic per recommendations after EGD.  Ellouise Newer, PA-C [facility] 10/08/2022, 1:56 PM  Cc:  Marge Duncans, PA-C

## 2022-10-08 NOTE — Patient Instructions (Addendum)
_______________________________________________________  If your blood pressure at your visit was 140/90 or greater, please contact your primary care physician to follow up on this.  _______________________________________________________  If you are age 61 or older, your body mass index should be between 23-30. Your Body mass index is 29.83 kg/m. If this is out of the aforementioned range listed, please consider follow up with your Primary Care Provider.  If you are age 8 or younger, your body mass index should be between 19-25. Your Body mass index is 29.83 kg/m. If this is out of the aformentioned range listed, please consider follow up with your Primary Care Provider.   ________________________________________________________  The [facility] providers would like to encourage you to use [facility] to communicate with providers for non-urgent requests or questions.  Due to long hold times on the telephone, sending your provider a message by [facility] may be a faster and more efficient way to get a response.  Please allow 48 business hours for a response.  Please remember that this is for non-urgent requests.  _______________________________________________________  It has been recommended to you by your physician that you have a(n) EGD completed. Per your request, we did not schedule the procedure(s) today. Please contact our office at [phone] should you decide to have the procedure completed. You will be scheduled for a pre-visit and procedure at that time.  We have sent the following medications to your pharmacy for you to pick up at your convenience: Omeprazole '40mg'$  2 times a day   Call with any questions or concerns.  It was a pleasure to see you today!  Thank you for trusting me with your gastrointestinal care!

## 2022-10-09 ENCOUNTER — Other Ambulatory Visit: Payer: Self-pay

## 2022-10-09 ENCOUNTER — Telehealth: Payer: Self-pay

## 2022-10-09 DIAGNOSIS — Z85048 Personal history of other malignant neoplasm of rectum, rectosigmoid junction, and anus: Secondary | ICD-10-CM

## 2022-10-09 MED ORDER — NA SULFATE-K SULFATE-MG SULF 17.5-3.13-1.6 GM/177ML PO SOLN: 1.0000 | Freq: Once | ORAL | 0 refills | Status: AC

## 2022-10-09 NOTE — Telephone Encounter (Signed)
-----   Message from Irving Copas., MD sent at 10/08/2022 11:25 PM EDT ----- Regarding: RE: Rectal EUS Team, Happy to just go ahead and do the full colonoscopy at the time of the lower EUS and then will minimize multiple procedures.  Maleiah Dula, This patient's lower EUS can be scheduled with full colonoscopy this year. If she is due or nearly due, you can go ahead and get her scheduled for that as well in the hospital.  This will be a 60-minute lower EUS. Thanks. GM ----- Message ----- From: Irene Shipper, MD Sent: 10/08/2022   5:24 PM EDT To: Levin Erp, PA; # Subject: RE: Rectal EUS                                 Different procedures in different settings for different reasons. I will do EGD.  I will do surveillance colonoscopy when due. Chester Holstein will do EUS when due, for history of rectal carcinoma Thanks for checking JP ----- Message ----- From: Levin Erp, PA Sent: 10/08/2022   2:34 PM EDT To: Irene Shipper, MD; Irving Copas., MD Subject: Rectal EUS                                     Patient due for surveillance rectal EUS with you.  She is also due though in November for a full colonoscopy.  Just wondering how you would like this done, possibly we could delay EUS and do both together?  She is scheduled for an EGD with Dr. [name] given new onset heartburn and reflux worsening.  Went ahead and schedule this as it was her chief complaint today.  Thanks, JL L

## 2022-10-09 NOTE — Telephone Encounter (Signed)
Patient has returned your phone call.  Please call her back.

## 2022-10-09 NOTE — Telephone Encounter (Signed)
Colon Lower EUS scheduled, pt instructed and medications reviewed.  Patient instructions mailed to home.  Patient to call with any questions or concerns.

## 2022-10-09 NOTE — Telephone Encounter (Signed)
The pt has been scheduled for colon Lower EUS on 11/18/22 at 1015 am at [facility] with GM   Left message on machine to call back

## 2022-10-16 ENCOUNTER — Other Ambulatory Visit: Payer: Self-pay | Admitting: Physician Assistant

## 2022-10-16 DIAGNOSIS — I10 Essential (primary) hypertension: Secondary | ICD-10-CM

## 2022-10-19 ENCOUNTER — Telehealth: Payer: Self-pay | Admitting: Gastroenterology

## 2022-10-19 ENCOUNTER — Other Ambulatory Visit: Payer: Self-pay | Admitting: Physician Assistant

## 2022-10-19 DIAGNOSIS — Z85048 Personal history of other malignant neoplasm of rectum, rectosigmoid junction, and anus: Secondary | ICD-10-CM

## 2022-10-19 DIAGNOSIS — I10 Essential (primary) hypertension: Secondary | ICD-10-CM

## 2022-10-19 DIAGNOSIS — K219 Gastro-esophageal reflux disease without esophagitis: Secondary | ICD-10-CM

## 2022-10-19 DIAGNOSIS — C7A026 Malignant carcinoid tumor of the rectum: Secondary | ICD-10-CM

## 2022-10-19 MED ORDER — LISINOPRIL 20 MG PO TABS: 20.0000 mg | ORAL_TABLET | Freq: Every day | ORAL | 1 refills | Status: DC

## 2022-10-19 NOTE — Telephone Encounter (Signed)
Dr.Mansouraty-   Return call to patient. Patient is requesting that EGD be done at the same time as the Colon +Lower EUS @ [facility] on 11/18/22. Patient would prefer to be put under anesthesia once.  I will have to check to see if the time can be added to the already scheduled procedures. If timing can be worked out ,please advise, if EGD can be added?

## 2022-10-19 NOTE — Telephone Encounter (Signed)
Shannon [name], If the time permits and happy to do it. If it does not permit, then she will need to be rescheduled to another day in May or June.  Thanks. GM

## 2022-10-19 NOTE — Telephone Encounter (Signed)
PT has a colonoscopy scheduled for 4/24 at [facility] and wants to know will they be checking to see if she has an ulcer. Please advise.

## 2022-10-20 NOTE — Telephone Encounter (Signed)
EGD has been added to Colon+EUS on 11/18/22 @ [facility]. Updated amb ref has been sent for insurance purposes. Pt has been informed and was very thankful.

## 2022-10-20 NOTE — Addendum Note (Signed)
Addended byDebbe Mounts on: 10/20/2022 02:01 PM   Modules accepted: Orders

## 2022-10-21 NOTE — Telephone Encounter (Signed)
Thank you for update. GM 

## 2022-10-22 ENCOUNTER — Encounter: Payer: Self-pay | Admitting: Physician Assistant

## 2022-11-04 ENCOUNTER — Telehealth: Payer: Self-pay | Admitting: Internal Medicine

## 2022-11-04 NOTE — Telephone Encounter (Signed)
Rescheduled July appointments per in-basket message, patient is notified.

## 2022-11-06 ENCOUNTER — Other Ambulatory Visit: Payer: Self-pay | Admitting: Physician Assistant

## 2022-11-06 DIAGNOSIS — E782 Mixed hyperlipidemia: Secondary | ICD-10-CM

## 2022-11-06 NOTE — Telephone Encounter (Signed)
Patient is calling having questions regarding a back injection she is scheduled to have on 4/17 is not sure if it will interfere with her procedure. Please advise

## 2022-11-06 NOTE — Telephone Encounter (Signed)
I have spoken to the pt and she tells me that she is getting an injection for her back pain and wants to know if it will interfere with her colon.  I have told her to call the MD that is doing the injection to make sure they do not have any concerns. The pt has been advised of the information and verbalized understanding.

## 2022-11-11 ENCOUNTER — Encounter (HOSPITAL_COMMUNITY): Payer: Self-pay | Admitting: Gastroenterology

## 2022-11-11 ENCOUNTER — Encounter: Payer: Self-pay | Admitting: Gastroenterology

## 2022-11-11 NOTE — Progress Notes (Signed)
Attempted to obtain medical history via telephone, unable to reach at this time. HIPAA compliant voicemail message left requesting return call to pre surgical testing department. 

## 2022-11-12 ENCOUNTER — Other Ambulatory Visit: Payer: Self-pay | Admitting: Physician Assistant

## 2022-11-16 ENCOUNTER — Ambulatory Visit: Payer: BC Managed Care – PPO | Admitting: Physician Assistant

## 2022-11-16 ENCOUNTER — Encounter: Payer: Self-pay | Admitting: Physician Assistant

## 2022-11-16 VITALS — BP 132/84 | HR 65 | Temp 96.9°F | Ht 65.5 in | Wt 181.0 lb

## 2022-11-16 DIAGNOSIS — D508 Other iron deficiency anemias: Secondary | ICD-10-CM

## 2022-11-16 DIAGNOSIS — E782 Mixed hyperlipidemia: Secondary | ICD-10-CM

## 2022-11-16 DIAGNOSIS — I10 Essential (primary) hypertension: Secondary | ICD-10-CM

## 2022-11-16 DIAGNOSIS — F419 Anxiety disorder, unspecified: Secondary | ICD-10-CM

## 2022-11-16 DIAGNOSIS — C7A090 Malignant carcinoid tumor of the bronchus and lung: Secondary | ICD-10-CM

## 2022-11-16 DIAGNOSIS — C7A026 Malignant carcinoid tumor of the rectum: Secondary | ICD-10-CM

## 2022-11-16 DIAGNOSIS — E559 Vitamin D deficiency, unspecified: Secondary | ICD-10-CM

## 2022-11-16 DIAGNOSIS — R3915 Urgency of urination: Secondary | ICD-10-CM | POA: Diagnosis not present

## 2022-11-16 LAB — POCT URINALYSIS DIP (CLINITEK)
Bilirubin, UA: NEGATIVE
Blood, UA: NEGATIVE
Glucose, UA: NEGATIVE mg/dL
Ketones, POC UA: NEGATIVE mg/dL
Nitrite, UA: NEGATIVE
POC PROTEIN,UA: NEGATIVE
Spec Grav, UA: 1.015 (ref 1.010–1.025)
Urobilinogen, UA: 0.2 E.U./dL
pH, UA: 6.5 (ref 5.0–8.0)

## 2022-11-16 NOTE — Progress Notes (Signed)
Subjective:  Patient ID: Shannon [name], female    DOB: [birth year]  Age: 62 y.o. MRN: [id number]  Chief Complaint  Patient presents with   Hyperlipidemia   Hypertension    Hyperlipidemia  Hypertension    Pt presents for follow up of hypertension. The patient is tolerating the medication well without side effects. Compliance with treatment has been good; including taking medication as directed , maintains a healthy diet and regular exercise regimen , and following up as directed. Taking zestril 20mg  qd  Pt with history of GERD - symptoms stable on prilosec 40mg  qd  Mixed hyperlipidemia  Pt presents with hyperlipidemia.  Compliance with treatment has been good - The patient is compliant with medications, maintains a low cholesterol diet , follows up as directed , and maintains an exercise regimen . The patient denies experiencing any hypercholesterolemia related symptoms. 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 - she continues to follow with oncology and symptoms have been stable She sees pulmonologist Dr [name] and has upcoming appt She follows GI Dr Georgie Chard and has appt this Wednesday for endoscopy and colonoscopy  Pt is following with Dr Huel Cote GYN and will be scheduled for total hysterectomy later this  summer - has been found to have uterine polyp  Pt states that she has had mild urgency/pressure with urine but states she always feels that - unsure if pressure from uterus Current Outpatient Medications on File Prior to Visit  Medication Sig Dispense Refill   acetaminophen (TYLENOL) 325 MG tablet Take 650 mg by mouth every 6 (six) hours as needed for moderate pain.     albuterol (VENTOLIN HFA) 108 (90 Base) MCG/ACT inhaler INHALE 2 PUFFS INTO THE LUNGS EVERY 6 HOURS AS NEEDED FOR WHEEZING OR SHORTNESS OF BREATH 6.7 g 1   busPIRone  (BUSPAR) 10 MG tablet TAKE 1/2 TABLET BY MOUTH TWICE DAILY 60 tablet 1   calcium carbonate (TUMS - DOSED IN MG ELEMENTAL CALCIUM) 500 MG chewable tablet Chew 1 tablet by mouth daily as needed for indigestion or heartburn.     Cholecalciferol (VITAMIN D-3) 125 MCG (5000 UT) TABS Take 5,000 Units by mouth every evening.     Cyanocobalamin (B-12) 2500 MCG TABS Take 2,500 mcg by mouth daily.     cyclobenzaprine (FLEXERIL) 10 MG tablet Take 10 mg by mouth 3 (three) times daily as needed for muscle spasms.     D-Mannose 500 MG CAPS Take 1,000 mg by mouth daily.     famotidine (PEPCID) 20 MG tablet Take 20 mg by mouth every evening.     ferrous sulfate 324 MG TBEC Take 324 mg by mouth daily with breakfast.     gabapentin (NEURONTIN) 300 MG capsule Take 300 mg by mouth at bedtime as needed (pain).     lisinopril (ZESTRIL) 20 MG tablet Take 1 tablet (20 mg total) by mouth daily. 90 tablet 1   LORazepam (ATIVAN) 1 MG tablet TAKE 1 TABLET(1 MG) BY MOUTH AT BEDTIME 90 tablet 0   meloxicam (MOBIC) 7.5 MG tablet TAKE 1 TABLET(7.5 MG) BY MOUTH TWICE DAILY AS NEEDED FOR PAIN 60 tablet 5   omeprazole (PRILOSEC) 40 MG capsule Take 1 capsule (40 mg total) by mouth 2 (two) times daily. Take 30-60 minutes before meals (Patient taking differently: Take 40 mg by mouth daily. Take 30-60 minutes before meals) 60 capsule  5   REPATHA SURECLICK 140 MG/ML SOAJ ADMINISTER 1 ML(140 MG) UNDER THE SKIN EVERY 14 DAYS 2 mL 1   No current facility-administered medications on file prior to visit.   Past Medical History:  Diagnosis Date   Anxiety    Arthritis    Asthma    Cancer    rectal, Lung   Dyspnea    with exertion   Emphysema of lung    patient denies   History of kidney stones    passed   HLD (hyperlipidemia)    Pneumonia 02/2018   Rectal tumor    cancer   Seasonal allergies    Past Surgical History:  Procedure Laterality Date   ABLATION     endometrial   BIOPSY  10/10/2018   Procedure: BIOPSY;  Surgeon:  Lemar Lofty., MD;  Location: [facility] ENDOSCOPY;  Service: Gastroenterology;;   BIOPSY  06/12/2019   Procedure: BIOPSY;  Surgeon: Lemar Lofty., MD;  Location: [facility] ENDOSCOPY;  Service: Gastroenterology;;   BIOPSY  09/30/2020   Procedure: BIOPSY;  Surgeon: Lemar Lofty., MD;  Location: [facility] ENDOSCOPY;  Service: Gastroenterology;;   COLONOSCOPY     DOPPLER ECHOCARDIOGRAPHY     ENDOSCOPIC MUCOSAL RESECTION N/A 10/10/2018   Procedure: ENDOSCOPIC MUCOSAL RESECTION;  Surgeon: Lemar Lofty., MD;  Location: [facility] ENDOSCOPY;  Service: Gastroenterology;  Laterality: N/A;   EUS N/A 07/18/2018   Procedure: LOWER ENDOSCOPIC ULTRASOUND (EUS);  Surgeon: Lemar Lofty., MD;  Location: [facility] ENDOSCOPY;  Service: Gastroenterology;  Laterality: N/A;   EUS N/A 10/10/2018   Procedure: LOWER ENDOSCOPIC ULTRASOUND (EUS);  Surgeon: Lemar Lofty., MD;  Location: [facility] ENDOSCOPY;  Service: Gastroenterology;  Laterality: N/A;   EUS N/A 06/12/2019   Procedure: LOWER ENDOSCOPIC ULTRASOUND (EUS);  Surgeon: Lemar Lofty., MD;  Location: [facility] ENDOSCOPY;  Service: Gastroenterology;  Laterality: N/A;   EUS N/A 09/30/2020   Procedure: LOWER ENDOSCOPIC ULTRASOUND (EUS);  Surgeon: Lemar Lofty., MD;  Location: [facility] ENDOSCOPY;  Service: Gastroenterology;  Laterality: N/A;   FLEXIBLE SIGMOIDOSCOPY N/A 07/18/2018   Procedure: FLEXIBLE SIGMOIDOSCOPY;  Surgeon: Meridee Score Netty Starring., MD;  Location: [facility] ENDOSCOPY;  Service: Gastroenterology;  Laterality: N/A;   FLEXIBLE SIGMOIDOSCOPY N/A 10/10/2018   Procedure: FLEXIBLE SIGMOIDOSCOPY;  Surgeon: Meridee Score Netty Starring., MD;  Location: [facility] ENDOSCOPY;  Service: Gastroenterology;  Laterality: N/A;   FLEXIBLE SIGMOIDOSCOPY N/A 06/12/2019   Procedure: FLEXIBLE SIGMOIDOSCOPY;  Surgeon: Meridee Score Netty Starring., MD;  Location: [facility] ENDOSCOPY;  Service: Gastroenterology;  Laterality: N/A;   FLEXIBLE SIGMOIDOSCOPY N/A 09/30/2020    Procedure: FLEXIBLE SIGMOIDOSCOPY;  Surgeon: Meridee Score Netty Starring., MD;  Location: [facility] ENDOSCOPY;  Service: Gastroenterology;  Laterality: N/A;   HEMOSTASIS CLIP PLACEMENT  10/10/2018   Procedure: HEMOSTASIS CLIP PLACEMENT;  Surgeon: Lemar Lofty., MD;  Location: [facility] ENDOSCOPY;  Service: Gastroenterology;;   LUNG CANCER SURGERY  12/30/2018   right lun- mid lobe , dr gerhardt   SUBMUCOSAL LIFTING INJECTION  10/10/2018   Procedure: SUBMUCOSAL LIFTING INJECTION;  Surgeon: Lemar Lofty., MD;  Location: [facility] ENDOSCOPY;  Service: Gastroenterology;;   TEE WITHOUT CARDIOVERSION  02/2018   TONSILLECTOMY     TOTAL HIP ARTHROPLASTY Left 11/2021   TUBAL LIGATION  1987   VIDEO ASSISTED THORACOSCOPY (VATS)/WEDGE RESECTION Right 12/30/2018   Procedure: VIDEO ASSISTED THORACOSCOPY (VATS), MINI THORACOTOMY, RIGHT MIDDLE LOBECTOMY WITH LYMPH NODE DISSECTION;  Surgeon: Delight Ovens, MD;  Location: [facility] OR;  Service: Thoracic;  Laterality: Right;   VIDEO BRONCHOSCOPY N/A 10/12/2018   Procedure: VIDEO BRONCHOSCOPY;  Surgeon: Delight Ovens, MD;  Location: [facility] OR;  Service: Thoracic;  Laterality: N/A;   VIDEO BRONCHOSCOPY N/A 12/30/2018   Procedure: VIDEO BRONCHOSCOPY;  Surgeon: Delight Ovens, MD;  Location: [facility] OR;  Service: Thoracic;  Laterality: N/A;   WISDOM TOOTH EXTRACTION      Family History  Problem Relation Age of Onset   Heart disease Mother    COPD Mother    Emphysema Mother    Diabetes Father    Colon cancer Neg Hx    Colon polyps Neg Hx    Esophageal cancer Neg Hx    Rectal cancer Neg Hx    Stomach cancer Neg Hx    Social History   Socioeconomic History   Marital status: Married    Spouse name: Not on file   Number of children: Not on file   Years of education: Not on file   Highest education level: GED or equivalent  Occupational History   Not on file  Tobacco Use   Smoking status: Former    Years: 33    Types: Cigarettes    Quit date: 2008    Years  since quitting: 16.3   Smokeless tobacco: Never  Vaping Use   Vaping Use: Never used  Substance and Sexual Activity   Alcohol use: Never    Comment: rare   Drug use: Never   Sexual activity: Not Currently    Birth control/protection: Post-menopausal  Other Topics Concern   Not on file  Social History Narrative   Not on file   Social Determinants of Health   Financial Resource Strain: Low Risk  (11/12/2022)   Overall Financial Resource Strain (CARDIA)    Difficulty of Paying Living Expenses: Not hard at all  Food Insecurity: No Food Insecurity (11/12/2022)   Hunger Vital Sign    Worried About Running Out of Food in the Last Year: Never true    Ran Out of Food in the Last Year: Never true  Transportation Needs: No Transportation Needs (11/12/2022)   PRAPARE - Administrator, Civil Service (Medical): No    Lack of Transportation (Non-Medical): No  Physical Activity: Sufficiently Active (11/12/2022)   Exercise Vital Sign    Days of Exercise per Week: 7 days    Minutes of Exercise per Session: 30 min  Stress: Stress Concern Present (11/12/2022)   [employer] of Occupational Health - Occupational Stress Questionnaire    Feeling of Stress : To some extent  Social Connections: Moderately Isolated (11/12/2022)   Social Connection and Isolation Panel [NHANES]    Frequency of Communication with Friends and Family: Three times a week    Frequency of Social Gatherings with Friends and Family: Once a week    Attends Religious Services: Never    Database administrator or Organizations: No    Attends Engineer, structural: Not on file    Marital Status: Married   CONSTITUTIONAL: Negative for chills, fatigue, fever, unintentional weight gain and unintentional weight loss.  E/N/T: Negative for ear pain, nasal congestion and sore throat.  CARDIOVASCULAR: Negative for chest pain, dizziness, palpitations and pedal edema.  RESPIRATORY: Negative for recent cough and  dyspnea.  GASTROINTESTINAL: Negative for abdominal pain, acid reflux symptoms, constipation, diarrhea, nausea and vomiting.  GU - see HPI MSK: Negative for arthralgias and myalgias.  INTEGUMENTARY: Negative for rash.  NEUROLOGICAL: Negative for dizziness and headaches.  PSYCHIATRIC: Negative for sleep disturbance and to question depression screen.  Negative for depression, negative  for anhedonia.       Objective:  PHYSICAL EXAM:   VS: BP 132/84 (BP Location: Left Arm, Patient Position: Sitting, Cuff Size: Large)   Pulse 65   Temp (!) 96.9 F (36.1 C) (Temporal)   Ht 5' 5.5" (1.664 m)   Wt 181 lb (82.1 kg)   LMP  (LMP Unknown)   SpO2 98%   BMI 29.66 kg/m   GEN: Well nourished, well developed, in no acute distress  Cardiac: RRR; no murmurs, rubs, or gallops,no edema - no significant varicosities Respiratory:  normal respiratory rate and pattern with no distress - normal breath sounds with no rales, rhonchi, wheezes or rubs MS: no deformity or atrophy  Skin: warm and dry, no rash  Neuro:  Alert and Oriented x 3, - CN II-Xii grossly intact Psych: euthymic mood, appropriate affect and demeanor  Office Visit on 11/16/2022  Component Date Value Ref Range Status   Color, UA 11/16/2022 yellow  yellow Final   Clarity, UA 11/16/2022 clear  clear Final   Glucose, UA 11/16/2022 negative  negative mg/dL Final   Bilirubin, UA 40/98/1191 negative  negative Final   Ketones, POC UA 11/16/2022 negative  negative mg/dL Final   Spec Grav, UA 47/82/9562 1.015  1.010 - 1.025 Final   Blood, UA 11/16/2022 negative  negative Final   pH, UA 11/16/2022 6.5  5.0 - 8.0 Final   POC PROTEIN,UA 11/16/2022 negative  negative, trace Final   Urobilinogen, UA 11/16/2022 0.2  0.2 or 1.0 E.U./dL Final   Nitrite, UA 13/02/6577 Negative  Negative Final   Leukocytes, UA 11/16/2022 Small (1+) (A)  Negative Final    Office Visit on 11/16/2022  Component Date Value Ref Range Status   Color, UA 11/16/2022 yellow   yellow Final   Clarity, UA 11/16/2022 clear  clear Final   Glucose, UA 11/16/2022 negative  negative mg/dL Final   Bilirubin, UA 46/96/2952 negative  negative Final   Ketones, POC UA 11/16/2022 negative  negative mg/dL Final   Spec Grav, UA 84/13/2440 1.015  1.010 - 1.025 Final   Blood, UA 11/16/2022 negative  negative Final   pH, UA 11/16/2022 6.5  5.0 - 8.0 Final   POC PROTEIN,UA 11/16/2022 negative  negative, trace Final   Urobilinogen, UA 11/16/2022 0.2  0.2 or 1.0 E.U./dL Final   Nitrite, UA 05/23/2535 Negative  Negative Final   Leukocytes, UA 11/16/2022 Small (1+) (A)  Negative Final    Lab Results  Component Value Date   WBC 5.7 07/08/2022   HGB 12.0 07/08/2022   HCT 39.0 07/08/2022   PLT 266 07/08/2022   GLUCOSE 83 07/08/2022   CHOL 164 07/08/2022   TRIG 92 07/08/2022   HDL 69 07/08/2022   LDLCALC 78 07/08/2022   ALT 13 07/08/2022   AST 17 07/08/2022   NA 141 07/08/2022   K 4.7 07/08/2022   CL 102 07/08/2022   CREATININE 0.77 07/08/2022   BUN 18 07/08/2022   CO2 24 07/08/2022   TSH 1.790 07/08/2022   INR 1.1 12/27/2018   HGBA1C 5.5 10/22/2021      Assessment & Plan:   Problem List Items Addressed This Visit       Respiratory   Carcinoid bronchial adenoma, right (HCC) Follow with oncology     Digestive   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 Follow up with GI as well - scheduled for upcoming colonoscopy and endoscopy     Other   Mixed hyperlipidemia - Primary   Relevant  Orders   CBC with Differential/Platelet   Comprehensive metabolic panel   Lipid panel Continue repatha   Vitamin D insufficiency   Relevant Orders   VITAMIN D 25 Hydroxy (Vit-D Deficiency, Fractures)   Anxiety   Relevant Orders   TSH Continue meds      Iron def anemia Continue iron supplements Labwork pending      Urine urgency   Relevant Orders   POCT URINALYSIS DIP (CLINITEK) (Completed) Urine culture pending   Postmenopausal bleeding Continue follow up  with GYN and plan for hysterectomy as recommended  .  No orders of the defined types were placed in this encounter.   Orders Placed This Encounter  Procedures   Urine Culture   CBC with Differential/Platelet   Comprehensive metabolic panel   Lipid panel   Iron, TIBC and Ferritin Panel   VITAMIN D 25 Hydroxy (Vit-D Deficiency, Fractures)   POCT URINALYSIS DIP (CLINITEK)     Follow-up: Return in about 6 months (around 05/18/2023) for chronic fasting follow up.  An After Visit Summary was printed and given to the patient.  Jettie Pagan [facility] [phone]

## 2022-11-17 LAB — CBC WITH DIFFERENTIAL/PLATELET
Basophils Absolute: 0 10*3/uL (ref 0.0–0.2)
Basos: 1 %
EOS (ABSOLUTE): 0.5 10*3/uL — ABNORMAL HIGH (ref 0.0–0.4)
Eos: 8 %
Hematocrit: 48.7 % — ABNORMAL HIGH (ref 34.0–46.6)
Hemoglobin: 15.3 g/dL (ref 11.1–15.9)
Immature Grans (Abs): 0 10*3/uL (ref 0.0–0.1)
Immature Granulocytes: 1 %
Lymphocytes Absolute: 2.3 10*3/uL (ref 0.7–3.1)
Lymphs: 35 %
MCH: 27.2 pg (ref 26.6–33.0)
MCHC: 31.4 g/dL — ABNORMAL LOW (ref 31.5–35.7)
MCV: 87 fL (ref 79–97)
Monocytes Absolute: 0.4 10*3/uL (ref 0.1–0.9)
Monocytes: 6 %
Neutrophils Absolute: 3.3 10*3/uL (ref 1.4–7.0)
Neutrophils: 49 %
Platelets: 243 10*3/uL (ref 150–450)
RBC: 5.63 x10E6/uL — ABNORMAL HIGH (ref 3.77–5.28)
RDW: 15 % (ref 11.7–15.4)
WBC: 6.6 10*3/uL (ref 3.4–10.8)

## 2022-11-17 LAB — IRON,TIBC AND FERRITIN PANEL
Ferritin: 32 ng/mL (ref 15–150)
Iron Saturation: 29 % (ref 15–55)
Iron: 104 ug/dL (ref 27–139)
Total Iron Binding Capacity: 364 ug/dL (ref 250–450)
UIBC: 260 ug/dL (ref 118–369)

## 2022-11-17 LAB — LIPID PANEL
Chol/HDL Ratio: 3 ratio (ref 0.0–4.4)
Cholesterol, Total: 195 mg/dL (ref 100–199)
HDL: 65 mg/dL (ref >39)
LDL Chol Calc (NIH): 93 mg/dL (ref 0–99)
Triglycerides: 221 mg/dL — ABNORMAL HIGH (ref 0–149)
VLDL Cholesterol Cal: 37 mg/dL (ref 5–40)

## 2022-11-17 LAB — COMPREHENSIVE METABOLIC PANEL
ALT: 17 IU/L (ref 0–32)
AST: 17 IU/L (ref 0–40)
Albumin/Globulin Ratio: 2.1 (ref 1.2–2.2)
Albumin: 4.6 g/dL (ref 3.9–4.9)
Alkaline Phosphatase: 79 IU/L (ref 44–121)
BUN/Creatinine Ratio: 17 (ref 12–28)
BUN: 14 mg/dL (ref 8–27)
Bilirubin Total: 0.4 mg/dL (ref 0.0–1.2)
CO2: 25 mmol/L (ref 20–29)
Calcium: 10.2 mg/dL (ref 8.7–10.3)
Chloride: 103 mmol/L (ref 96–106)
Creatinine, Ser: 0.81 mg/dL (ref 0.57–1.00)
Globulin, Total: 2.2 g/dL (ref 1.5–4.5)
Glucose: 88 mg/dL (ref 70–99)
Potassium: 5.3 mmol/L — ABNORMAL HIGH (ref 3.5–5.2)
Sodium: 142 mmol/L (ref 134–144)
Total Protein: 6.8 g/dL (ref 6.0–8.5)
eGFR: 82 mL/min/{1.73_m2} (ref >59)

## 2022-11-17 LAB — CARDIOVASCULAR RISK ASSESSMENT

## 2022-11-17 LAB — VITAMIN D 25 HYDROXY (VIT D DEFICIENCY, FRACTURES): Vit D, 25-Hydroxy: 38.3 ng/mL (ref 30.0–100.0)

## 2022-11-17 NOTE — Anesthesia Preprocedure Evaluation (Signed)
Anesthesia Evaluation  Patient identified by MRN, date of birth, ID band Patient awake    Reviewed: Allergy & Precautions, H&P , NPO status , Patient's Chart, lab work & pertinent test results  History of Anesthesia Complications Negative for: history of anesthetic complications  Airway Mallampati: III  TM Distance: >3 FB Neck ROM: Full    Dental no notable dental hx. (+) Dental Advisory Given   Pulmonary shortness of breath, asthma , pneumonia, resolved, COPD,  COPD inhaler, former smoker Hx/o pulmonary carcinoid   Pulmonary exam normal        Cardiovascular negative cardio ROS Normal cardiovascular exam  EKG 12/27/18 SB, otherwise normal  Echo 03/23/18 Left ventricle: The cavity size was normal. Wall thickness was normal. Systolic function was normal. The estimated ejection fraction was in the range of 55% to 60%. Wall motion was normal; there were no regional wall motion abnormalities. Doppler parameters are consistent with abnormal left ventricular relaxation (grade 1 diastolic dysfunction).   Stress test 03/23/18  There was no ST segment deviation noted during stress.  Pt walked for 9:00 of a Bruce protocol GXT. Peak HR of 153 which is 93% predicted max HR  There were no ST or T wave changes to suggest ischemia  Blood pressure demonstrated a hypertensive response to exercise.  No arrhythmias  This is interpreted as a negagative stress test. no evidence of ischemia      Neuro/Psych  PSYCHIATRIC DISORDERS Anxiety        GI/Hepatic Neg liver ROS,,,Rectal carcinoid   Endo/Other  Obesity  Renal/GU negative Renal ROS  negative genitourinary   Musculoskeletal  (+) Arthritis , Osteoarthritis,    Abdominal   Peds  Hematology negative hematology ROS (+)   Anesthesia Other Findings   Reproductive/Obstetrics                             Anesthesia Physical Anesthesia  Plan  ASA: 2  Anesthesia Plan: MAC   Post-op Pain Management: Minimal or no pain anticipated   Induction: Intravenous  PONV Risk Score and Plan: 2 and Propofol infusion, Treatment may vary due to age or medical condition and Ondansetron  Airway Management Planned: Nasal Cannula and Natural Airway  Additional Equipment:   Intra-op Plan:   Post-operative Plan:   Informed Consent: I have reviewed the patients History and Physical, chart, labs and discussed the procedure including the risks, benefits and alternatives for the proposed anesthesia with the patient or authorized representative who has indicated his/her understanding and acceptance.     Dental advisory given  Plan Discussed with: Anesthesiologist and CRNA  Anesthesia Plan Comments:         Anesthesia Quick Evaluation

## 2022-11-18 ENCOUNTER — Encounter (HOSPITAL_COMMUNITY): Payer: Self-pay | Admitting: Gastroenterology

## 2022-11-18 ENCOUNTER — Ambulatory Visit (HOSPITAL_COMMUNITY): Payer: BC Managed Care – PPO | Admitting: Anesthesiology

## 2022-11-18 ENCOUNTER — Ambulatory Visit (HOSPITAL_COMMUNITY)
Admission: RE | Admit: 2022-11-18 | Discharge: 2022-11-18 | Disposition: A | Discharge status: Home / Self Care | Payer: BC Managed Care – PPO | Attending: Gastroenterology | Admitting: Gastroenterology

## 2022-11-18 ENCOUNTER — Other Ambulatory Visit: Payer: Self-pay

## 2022-11-18 ENCOUNTER — Encounter (HOSPITAL_COMMUNITY)
Admission: RE | Disposition: A | Discharge status: Home / Self Care | Payer: Self-pay | Source: Home / Self Care | Attending: Gastroenterology

## 2022-11-18 DIAGNOSIS — K5730 Diverticulosis of large intestine without perforation or abscess without bleeding: Secondary | ICD-10-CM | POA: Diagnosis not present

## 2022-11-18 DIAGNOSIS — Z85110 Personal history of malignant carcinoid tumor of bronchus and lung: Secondary | ICD-10-CM | POA: Diagnosis not present

## 2022-11-18 DIAGNOSIS — Z6829 Body mass index (BMI) 29.0-29.9, adult: Secondary | ICD-10-CM | POA: Diagnosis not present

## 2022-11-18 DIAGNOSIS — K2970 Gastritis, unspecified, without bleeding: Secondary | ICD-10-CM | POA: Insufficient documentation

## 2022-11-18 DIAGNOSIS — Z9889 Other specified postprocedural states: Secondary | ICD-10-CM | POA: Diagnosis not present

## 2022-11-18 DIAGNOSIS — K3189 Other diseases of stomach and duodenum: Secondary | ICD-10-CM | POA: Insufficient documentation

## 2022-11-18 DIAGNOSIS — K219 Gastro-esophageal reflux disease without esophagitis: Secondary | ICD-10-CM | POA: Diagnosis present

## 2022-11-18 DIAGNOSIS — D125 Benign neoplasm of sigmoid colon: Secondary | ICD-10-CM | POA: Diagnosis not present

## 2022-11-18 DIAGNOSIS — K259 Gastric ulcer, unspecified as acute or chronic, without hemorrhage or perforation: Secondary | ICD-10-CM | POA: Insufficient documentation

## 2022-11-18 DIAGNOSIS — Z85048 Personal history of other malignant neoplasm of rectum, rectosigmoid junction, and anus: Secondary | ICD-10-CM

## 2022-11-18 DIAGNOSIS — M1990 Unspecified osteoarthritis, unspecified site: Secondary | ICD-10-CM | POA: Insufficient documentation

## 2022-11-18 DIAGNOSIS — J4489 Other specified chronic obstructive pulmonary disease: Secondary | ICD-10-CM | POA: Diagnosis not present

## 2022-11-18 DIAGNOSIS — E669 Obesity, unspecified: Secondary | ICD-10-CM | POA: Insufficient documentation

## 2022-11-18 DIAGNOSIS — Z85040 Personal history of malignant carcinoid tumor of rectum: Secondary | ICD-10-CM | POA: Diagnosis not present

## 2022-11-18 DIAGNOSIS — K449 Diaphragmatic hernia without obstruction or gangrene: Secondary | ICD-10-CM | POA: Insufficient documentation

## 2022-11-18 DIAGNOSIS — K648 Other hemorrhoids: Secondary | ICD-10-CM | POA: Insufficient documentation

## 2022-11-18 DIAGNOSIS — Z87891 Personal history of nicotine dependence: Secondary | ICD-10-CM | POA: Insufficient documentation

## 2022-11-18 DIAGNOSIS — Z1211 Encounter for screening for malignant neoplasm of colon: Secondary | ICD-10-CM | POA: Diagnosis not present

## 2022-11-18 DIAGNOSIS — K2289 Other specified disease of esophagus: Secondary | ICD-10-CM | POA: Diagnosis not present

## 2022-11-18 DIAGNOSIS — D122 Benign neoplasm of ascending colon: Secondary | ICD-10-CM | POA: Diagnosis not present

## 2022-11-18 DIAGNOSIS — K644 Residual hemorrhoidal skin tags: Secondary | ICD-10-CM | POA: Diagnosis not present

## 2022-11-18 DIAGNOSIS — K635 Polyp of colon: Secondary | ICD-10-CM | POA: Insufficient documentation

## 2022-11-18 DIAGNOSIS — I899 Noninfective disorder of lymphatic vessels and lymph nodes, unspecified: Secondary | ICD-10-CM

## 2022-11-18 DIAGNOSIS — Z86010 Personal history of colonic polyps: Secondary | ICD-10-CM | POA: Diagnosis present

## 2022-11-18 HISTORY — PX: COLONOSCOPY WITH PROPOFOL: SHX5780

## 2022-11-18 HISTORY — PX: EUS: SHX5427

## 2022-11-18 HISTORY — PX: POLYPECTOMY: SHX5525

## 2022-11-18 HISTORY — PX: SAVORY DILATION: SHX5439

## 2022-11-18 HISTORY — PX: ESOPHAGOGASTRODUODENOSCOPY: SHX5428

## 2022-11-18 HISTORY — PX: BIOPSY: SHX5522

## 2022-11-18 LAB — URINE CULTURE

## 2022-11-18 SURGERY — COLONOSCOPY WITH PROPOFOL
Anesthesia: Monitor Anesthesia Care

## 2022-11-18 MED ORDER — OMEPRAZOLE 40 MG PO CPDR: 40.0000 mg | DELAYED_RELEASE_CAPSULE | Freq: Two times a day (BID) | ORAL | 5 refills | Status: DC

## 2022-11-18 MED ORDER — LIDOCAINE HCL (PF) 2 % IJ SOLN
INTRAMUSCULAR | Status: DC | PRN
  Administered 2022-11-18: 60 mg via INTRADERMAL

## 2022-11-18 MED ORDER — LACTATED RINGERS IV SOLN: INTRAVENOUS | Status: DC | PRN

## 2022-11-18 MED ORDER — MIDAZOLAM HCL 2 MG/2ML IJ SOLN
INTRAMUSCULAR | Status: AC
  Filled 2022-11-18: qty 2

## 2022-11-18 MED ORDER — PROPOFOL 10 MG/ML IV BOLUS
INTRAVENOUS | Status: DC | PRN
  Administered 2022-11-18: 90 ug/kg/min via INTRAVENOUS

## 2022-11-18 MED ORDER — LACTATED RINGERS IV SOLN
Freq: Once | INTRAVENOUS | Status: AC
  Administered 2022-11-18: 1000 mL via INTRAVENOUS

## 2022-11-18 MED ORDER — MIDAZOLAM HCL 5 MG/5ML IJ SOLN
INTRAMUSCULAR | Status: DC | PRN
  Administered 2022-11-18: 2 mg via INTRAVENOUS

## 2022-11-18 MED ORDER — SODIUM CHLORIDE 0.9 % IV SOLN: INTRAVENOUS | Status: DC

## 2022-11-18 MED ORDER — PROPOFOL 1000 MG/100ML IV EMUL
INTRAVENOUS | Status: AC
  Filled 2022-11-18: qty 100

## 2022-11-18 MED ORDER — PROPOFOL 500 MG/50ML IV EMUL
INTRAVENOUS | Status: DC | PRN
  Administered 2022-11-18: 60 mg via INTRAVENOUS

## 2022-11-18 SURGICAL SUPPLY — 22 items

## 2022-11-18 NOTE — Anesthesia Postprocedure Evaluation (Signed)
Anesthesia Post Note  Patient: Shannon [name]  Procedure(s) Performed: COLONOSCOPY WITH PROPOFOL LOWER ENDOSCOPIC ULTRASOUND (EUS) ESOPHAGOGASTRODUODENOSCOPY (EGD) BIOPSY SAVORY DILATION POLYPECTOMY     Patient location during evaluation: Endoscopy Anesthesia Type: MAC Level of consciousness: awake and alert Pain management: pain level controlled Vital Signs Assessment: post-procedure vital signs reviewed and stable Respiratory status: spontaneous breathing and respiratory function stable Cardiovascular status: stable Postop Assessment: no apparent nausea or vomiting Anesthetic complications: no   No notable events documented.  Last Vitals:  Vitals:   11/18/22 1130 11/18/22 1142  BP: (!) 171/83 (!) 155/86  Pulse: 66 68  Resp: 17 15  Temp:    SpO2: 100% 100%    Last Pain:  Vitals:   11/18/22 1142  TempSrc:   PainSc: 0-No pain                 Kylei Purington DANIEL

## 2022-11-18 NOTE — Op Note (Signed)
[facility] Patient Name: Shannon [name] Procedure Date: 11/18/2022 MRN: [id number] Attending MD: Corliss Parish , MD, [id number] Date of Birth: 1961-05-05 CSN: [id number] Age: 63 Admit Type: Outpatient Procedure:                Lower EUS Indications:              Colon Polyps surveillance (prior adenomas), history                            of previous rectal neuroendocrine Tumor Providers:                Corliss Parish, MD, Lorenza Evangelist, RN,                            Kandice Robinsons, Technician Referring MD:             Wilhemina Bonito. Marina Goodell, MD, Violet Baldy Arvella Nigh R.                            [place] Medicines:                Monitored Anesthesia Care Complications:            No immediate complications. Estimated Blood Loss:     Estimated blood loss was minimal. Procedure:                Pre-Anesthesia Assessment:                           - Prior to the procedure, a History and Physical                            was performed, and patient medications and                            allergies were reviewed. The patient's tolerance of                            previous anesthesia was also reviewed. The risks                            and benefits of the procedure and the sedation                            options and risks were discussed with the patient.                            All questions were answered, and informed consent                            was obtained. Prior Anticoagulants: The patient has                            taken no anticoagulant or antiplatelet agents  except for NSAID medication. ASA Grade Assessment:                            III - A patient with severe systemic disease. After                            reviewing the risks and benefits, the patient was                            deemed in satisfactory condition to undergo the                            procedure.                            After obtaining informed consent, the endoscope was                            passed under direct vision. Throughout the                            procedure, the patient's blood pressure, pulse, and                            oxygen saturations were monitored continuously. The                            CF-HQ190L ([id number]) Olympus colonoscope was                            introduced through the anus and advanced to the 3                            cm into the ileum. The GF-UE190-AL5 ([id number])                            Olympus radial ultrasound scope was introduced                            through the anus and advanced to the the sigmoid                            colon for ultrasound. The lower EUS was                            accomplished without difficulty. The patient                            tolerated the procedure. The quality of the bowel                            preparation was adequate. Scope In: 10:41:35 AM Scope Out: 11:06:37 AM Scope Withdrawal Time: 0 hours 22 minutes 19 seconds  Total Procedure Duration: 0 hours 25  minutes 2 seconds  Findings:      ENDOSCOPIC FINDING: :      The terminal ileum and ileocecal valve appeared normal.      Two sessile polyps were found in the sigmoid colon and ascending colon.       The polyps were 3 to 5 mm in size. These polyps were removed with a cold       snare. Resection and retrieval were complete.      A moderate amount of semi-liquid semi-solid stool was found in the       entire colon, interfering with visualization. Lavage of the area was       performed using copious amounts, resulting in clearance with adequate       visualization.      Many medium-mouthed and small-mouthed diverticula were found in the       entire colon.      A medium post mucosectomy scar was found in the mid rectum. The scar       tissue was healthy in appearance.      Normal mucosa was found in the entire colon otherwise.      Non-bleeding  non-thrombosed external and internal hemorrhoids were found       during retroflexion, during perianal exam and during digital exam.      The digital rectal exam findings include hemorrhoids. Pertinent       negatives include no palpable rectal lesions.      ENDOSONOGRAPHIC FINDING: :      The rectum was normal. There was evidence of a scar mucosally but       nothing within the wall. This is from the previous resection.      No malignant-appearing lymph nodes were visualized in the perirectal       region and in the left iliac region. The nodes were.      The internal anal sphincter was visualized endosonographically and       appeared normal. Impression:               COLON Impression:                           - The examined portion of the ileum was normal.                           - Two 3 to 5 mm polyps in the sigmoid colon and in                            the ascending colon, removed with a cold snare.                            Resected and retrieved.                           - Stool in the entire examined colon.                           - Diverticulosis in the entire examined colon.                           - Post mucosectomy scar in the mid rectum.                           -  Normal mucosa in the entire examined colon                            otherwise.                           - Non-bleeding non-thrombosed external and internal                            hemorrhoids.                           - Hemorrhoids found on digital rectal exam.                           EUS impression:                           - Endosonographic images of the rectum were                            unremarkable. No evidence of recurrent                            neuroendocrine tumor of the rectum.                           - No malignant-appearing lymph nodes were                            visualized endosonographically in the perirectal                            region and in the left iliac  region.                           - The internal anal sphincter was visualized                            endosonographically and appeared normal. Moderate Sedation:      Not Applicable - Patient had care per Anesthesia. Recommendation:           - The patient will be observed post-procedure,                            until all discharge criteria are met.                           - Discharge patient to home.                           - Patient has a contact number available for                            emergencies. The signs and symptoms of potential  delayed complications were discussed with the                            patient. Return to normal activities tomorrow.                            Written discharge instructions were provided to the                            patient.                           - High fiber diet.                           - Use FiberCon 1-2 tablets PO daily.                           - Await path results.                           - The patient has now had 5 years worth of                            follow-up in regards to the previous rectal                            neuroendocrine tumor. There is not been any                            recurrence. This is good news.                           - Recommend repeat colonoscopy surveillance                            interval to remain 5 years (no matter pathology of                            the polyps) due to the previous history of rectal                            neuroendocrine tumor and history of prior                            adenomatous colon polyps.                           - The findings and recommendations were discussed                            with the patient.                           - The findings and recommendations were discussed  with the patient's family. Procedure Code(s):        --- Professional ---                            [phone], Colonoscopy, flexible; with endoscopic                            ultrasound examination limited to the rectum,                            sigmoid, descending, transverse, or ascending colon                            and cecum, and adjacent structures                           45385, Colonoscopy, flexible; with removal of                            tumor(s), polyp(s), or other lesion(s) by snare                            technique Diagnosis Code(s):        --- Professional ---                           K64.8, Other hemorrhoids                           D12.5, Benign neoplasm of sigmoid colon                           D12.2, Benign neoplasm of ascending colon                           Z98.890, Other specified postprocedural states                           I89.9, Noninfective disorder of lymphatic vessels                            and lymph nodes, unspecified                           K63.5, Polyp of colon                           K57.30, Diverticulosis of large intestine without                            perforation or abscess without bleeding CPT copyright 2022 [employer]. All rights reserved. The codes documented in this report are preliminary and upon coder review may  be revised to meet current compliance requirements. Corliss Parish, MD 11/18/2022 11:25:39 AM Number of Addenda: 0

## 2022-11-18 NOTE — Op Note (Signed)
[facility] Patient Name: Shannon [name] Procedure Date: 11/18/2022 MRN: [id number] Attending MD: Corliss Parish , MD, [id number] Date of Birth: 1960-08-09 CSN: [id number] Age: 62 Admit Type: Outpatient Procedure:                Upper GI endoscopy Indications:              Upper abdominal pain, Heartburn, Gastro-esophageal                            reflux disease, Esophageal reflux symptoms that                            persist despite appropriate therapy Providers:                Corliss Parish, MD, Lorenza Evangelist, RN,                            Kandice Robinsons, Technician Referring MD:             Wilhemina Bonito. Marina Goodell, MD, Violet Baldy Arvella Nigh R.                            [place] Medicines:                Monitored Anesthesia Care Complications:            No immediate complications. Estimated Blood Loss:     Estimated blood loss was minimal. Procedure:                Pre-Anesthesia Assessment:                           - Prior to the procedure, a History and Physical                            was performed, and patient medications and                            allergies were reviewed. The patient's tolerance of                            previous anesthesia was also reviewed. The risks                            and benefits of the procedure and the sedation                            options and risks were discussed with the patient.                            All questions were answered, and informed consent                            was obtained. Prior Anticoagulants: The patient has  taken no anticoagulant or antiplatelet agents                            except for NSAID medication. ASA Grade Assessment:                            III - A patient with severe systemic disease. After                            reviewing the risks and benefits, the patient was                            deemed in satisfactory condition to  undergo the                            procedure.                           After obtaining informed consent, the endoscope was                            passed under direct vision. Throughout the                            procedure, the patient's blood pressure, pulse, and                            oxygen saturations were monitored continuously. The                            [id number]-H190 ([id number]) Olympus endoscope was introduced                            through the mouth, and advanced to the second part                            of duodenum. The upper GI endoscopy was                            accomplished without difficulty. The patient                            tolerated the procedure. Scope In: Scope Out: Findings:      No gross lesions were noted in the proximal esophagus and in the mid       esophagus.      Scattered islands of salmon-colored mucosa were present from 32 to 33       cm. No other visible abnormalities were present. Biopsies were taken       with a cold forceps for histology.      The Z-line was irregular and was found 33 cm from the incisors.      After the completion of the procedure, a guidewire was placed and the       scope was withdrawn. Dilation was performed in the esophagus with a  Savary dilator with no resistance at 18 mm. The dilation site was       examined following endoscope reinsertion and showed no change.      A 4 cm hiatal hernia was present.      Localized moderate inflammation characterized by erosions, erythema and       friability was found in the gastric antrum and in the prepyloric region       of the stomach.      No other gross lesions were noted in the entire examined stomach.       Biopsies were taken with a cold forceps for histology and Helicobacter       pylori testing.      The major papilla was normal.      Inferior and approximately 180 degrees from the major papilla, a single       7 mm submucosal nodule was found in  the second portion of the duodenum.       Tunneled biopsies were taken with a cold forceps for histology.       Unfortunately picture capturing of this nodule was not achieved.      No other gross lesions were noted in the duodenal bulb, in the first       portion of the duodenum and in the second portion of the duodenum. Impression:               - No gross lesions in the proximal esophagus and in                            the mid esophagus.                           - Salmon-colored mucosal islands suspicious for                            Barrett's esophagus found distally. Biopsied.                           - Z-line irregular, 33 cm from the incisors.                           - Savory dilation performed in the esophagus up to                            18 mm.                           - 4 cm hiatal hernia.                           - Gastritis distally. No other gross lesions in the                            entire stomach. Biopsied.                           - Normal major papilla.                           -  Submucosal nodule found in the duodenum (180                            degrees inferior to the major papilla). Tunnel                            biopsied.                           - No other gross lesions in the duodenal bulb, in                            the first portion of the duodenum and in the second                            portion of the duodenum. Moderate Sedation:      Not Applicable - Patient had care per Anesthesia. Recommendation:           - Proceed to scheduled colonoscopy.                           - Continue present medications.                           - Await pathology results.                           - Based on results of pathology, recommend repeat                            EGD with primary GI in 60-months to ensure healing                            of gastric ulcer and gastritis. Can also                            re-evaluate if the duodenal  nodule is still present                            (if so then consider Upper EUS for further                            evaluation).                           - Further workup and management as per primary GI                            in regards to her GERD like symptoms. Suspect                            hiatal hernia is causing issues for her.                           - The findings  and recommendations were discussed                            with the patient.                           - The findings and recommendations were discussed                            with the patient's family. Procedure Code(s):        --- Professional ---                           [phone], Esophagogastroduodenoscopy, flexible,                            transoral; with insertion of guide wire followed by                            passage of dilator(s) through esophagus over guide                            wire                           43239, 59, Esophagogastroduodenoscopy, flexible,                            transoral; with biopsy, single or multiple Diagnosis Code(s):        --- Professional ---                           K22.89, Other specified disease of esophagus                           K44.9, Diaphragmatic hernia without obstruction or                            gangrene                           K29.70, Gastritis, unspecified, without bleeding                           K31.89, Other diseases of stomach and duodenum                           R10.10, Upper abdominal pain, unspecified                           R12, Heartburn                           K21.9, Gastro-esophageal reflux disease without                            esophagitis CPT copyright 2022 [employer]. All rights reserved. The codes documented in this report  are preliminary and upon coder review may  be revised to meet current compliance requirements. Corliss Parish, MD 11/18/2022 11:14:30 AM Number of  Addenda: 0

## 2022-11-18 NOTE — H&P (Signed)
GASTROENTEROLOGY PROCEDURE H&P NOTE   Primary Care Physician: [name], PA-C  HPI: Shannon [name] is a 62 y.o. female who presents for EGD/Colonoscopy with Lower EUS to evaluate Upper abdominal pain/GERD/Dysphagia and for surveillance of previous colon polyps and history of resected Rectal NET.  Past Medical History:  Diagnosis Date   Anxiety    Arthritis    Asthma    Cancer    rectal, Lung   Dyspnea    with exertion   Emphysema of lung    patient denies   History of kidney stones    passed   HLD (hyperlipidemia)    Pneumonia 02/2018   Rectal tumor    cancer   Seasonal allergies    Past Surgical History:  Procedure Laterality Date   ABLATION     endometrial   BIOPSY  10/10/2018   Procedure: BIOPSY;  Surgeon: Lemar Lofty., MD;  Location: [facility] ENDOSCOPY;  Service: Gastroenterology;;   BIOPSY  06/12/2019   Procedure: BIOPSY;  Surgeon: Lemar Lofty., MD;  Location: [facility] ENDOSCOPY;  Service: Gastroenterology;;   BIOPSY  09/30/2020   Procedure: BIOPSY;  Surgeon: Lemar Lofty., MD;  Location: [facility] ENDOSCOPY;  Service: Gastroenterology;;   COLONOSCOPY     DOPPLER ECHOCARDIOGRAPHY     ENDOSCOPIC MUCOSAL RESECTION N/A 10/10/2018   Procedure: ENDOSCOPIC MUCOSAL RESECTION;  Surgeon: Lemar Lofty., MD;  Location: [facility] ENDOSCOPY;  Service: Gastroenterology;  Laterality: N/A;   EUS N/A 07/18/2018   Procedure: LOWER ENDOSCOPIC ULTRASOUND (EUS);  Surgeon: Lemar Lofty., MD;  Location: [facility] ENDOSCOPY;  Service: Gastroenterology;  Laterality: N/A;   EUS N/A 10/10/2018   Procedure: LOWER ENDOSCOPIC ULTRASOUND (EUS);  Surgeon: Lemar Lofty., MD;  Location: [facility] ENDOSCOPY;  Service: Gastroenterology;  Laterality: N/A;   EUS N/A 06/12/2019   Procedure: LOWER ENDOSCOPIC ULTRASOUND (EUS);  Surgeon: Lemar Lofty., MD;  Location: [facility] ENDOSCOPY;  Service: Gastroenterology;  Laterality: N/A;   EUS N/A 09/30/2020   Procedure: LOWER  ENDOSCOPIC ULTRASOUND (EUS);  Surgeon: Lemar Lofty., MD;  Location: [facility] ENDOSCOPY;  Service: Gastroenterology;  Laterality: N/A;   FLEXIBLE SIGMOIDOSCOPY N/A 07/18/2018   Procedure: FLEXIBLE SIGMOIDOSCOPY;  Surgeon: Meridee Score Netty Starring., MD;  Location: [facility] ENDOSCOPY;  Service: Gastroenterology;  Laterality: N/A;   FLEXIBLE SIGMOIDOSCOPY N/A 10/10/2018   Procedure: FLEXIBLE SIGMOIDOSCOPY;  Surgeon: Meridee Score Netty Starring., MD;  Location: [facility] ENDOSCOPY;  Service: Gastroenterology;  Laterality: N/A;   FLEXIBLE SIGMOIDOSCOPY N/A 06/12/2019   Procedure: FLEXIBLE SIGMOIDOSCOPY;  Surgeon: Meridee Score Netty Starring., MD;  Location: [facility] ENDOSCOPY;  Service: Gastroenterology;  Laterality: N/A;   FLEXIBLE SIGMOIDOSCOPY N/A 09/30/2020   Procedure: FLEXIBLE SIGMOIDOSCOPY;  Surgeon: Meridee Score Netty Starring., MD;  Location: [facility] ENDOSCOPY;  Service: Gastroenterology;  Laterality: N/A;   HEMOSTASIS CLIP PLACEMENT  10/10/2018   Procedure: HEMOSTASIS CLIP PLACEMENT;  Surgeon: Lemar Lofty., MD;  Location: [facility] ENDOSCOPY;  Service: Gastroenterology;;   LUNG CANCER SURGERY  12/30/2018   right lun- mid lobe , dr gerhardt   SUBMUCOSAL LIFTING INJECTION  10/10/2018   Procedure: SUBMUCOSAL LIFTING INJECTION;  Surgeon: Lemar Lofty., MD;  Location: [facility] ENDOSCOPY;  Service: Gastroenterology;;   TEE WITHOUT CARDIOVERSION  02/2018   TONSILLECTOMY     TOTAL HIP ARTHROPLASTY Left 11/2021   TUBAL LIGATION  1987   VIDEO ASSISTED THORACOSCOPY (VATS)/WEDGE RESECTION Right 12/30/2018   Procedure: VIDEO ASSISTED THORACOSCOPY (VATS), MINI THORACOTOMY, RIGHT MIDDLE LOBECTOMY WITH LYMPH NODE DISSECTION;  Surgeon: Delight Ovens, MD;  Location: [facility] OR;  Service: Thoracic;  Laterality: Right;   VIDEO BRONCHOSCOPY N/A 10/12/2018   Procedure: VIDEO BRONCHOSCOPY;  Surgeon: Delight Ovens, MD;  Location: [facility] OR;  Service: Thoracic;  Laterality: N/A;   VIDEO BRONCHOSCOPY N/A 12/30/2018   Procedure: VIDEO  BRONCHOSCOPY;  Surgeon: Delight Ovens, MD;  Location: [facility] OR;  Service: Thoracic;  Laterality: N/A;   WISDOM TOOTH EXTRACTION     Current Facility-Administered Medications  Medication Dose Route Frequency Provider Last Rate Last Admin   0.9 %  sodium chloride infusion   Intravenous Continuous Mansouraty, Netty Starring., MD        Current Facility-Administered Medications:    0.9 %  sodium chloride infusion, , Intravenous, Continuous, Mansouraty, Netty Starring., MD Allergies  Allergen Reactions   Hydrocodone Nausea Only    "terrible headache"   Pravastatin Other (See Comments)    Muscle/joint paon   Other     UNSPECIFIED REACTION  Dog Dander    Levaquin [Levofloxacin In D5w] Nausea And Vomiting        Family History  Problem Relation Age of Onset   Heart disease Mother    COPD Mother    Emphysema Mother    Diabetes Father    Colon cancer Neg Hx    Colon polyps Neg Hx    Esophageal cancer Neg Hx    Rectal cancer Neg Hx    Stomach cancer Neg Hx    Social History   Socioeconomic History   Marital status: Married    Spouse name: Not on file   Number of children: Not on file   Years of education: Not on file   Highest education level: GED or equivalent  Occupational History   Not on file  Tobacco Use   Smoking status: Former    Years: 33    Types: Cigarettes    Quit date: 2008    Years since quitting: 16.3   Smokeless tobacco: Never  Vaping Use   Vaping Use: Never used  Substance and Sexual Activity   Alcohol use: Never    Comment: rare   Drug use: Never   Sexual activity: Not Currently    Birth control/protection: Post-menopausal  Other Topics Concern   Not on file  Social History Narrative   Not on file   Social Determinants of Health   Financial Resource Strain: Low Risk  (11/12/2022)   Overall Financial Resource Strain (CARDIA)    Difficulty of Paying Living Expenses: Not hard at all  Food Insecurity: No Food Insecurity (11/12/2022)   Hunger Vital Sign     Worried About Running Out of Food in the Last Year: Never true    Ran Out of Food in the Last Year: Never true  Transportation Needs: No Transportation Needs (11/12/2022)   PRAPARE - Administrator, Civil Service (Medical): No    Lack of Transportation (Non-Medical): No  Physical Activity: Sufficiently Active (11/12/2022)   Exercise Vital Sign    Days of Exercise per Week: 7 days    Minutes of Exercise per Session: 30 min  Stress: Stress Concern Present (11/12/2022)   [employer] of Occupational Health - Occupational Stress Questionnaire    Feeling of Stress : To some extent  Social Connections: Moderately Isolated (11/12/2022)   Social Connection and Isolation Panel [NHANES]    Frequency of Communication with Friends and Family: Three times a week    Frequency of Social Gatherings with Friends and Family: Once a week    Attends Religious Services: Never  Active Member of Clubs or Organizations: No    Attends Banker Meetings: Not on file    Marital Status: Married  Intimate Partner Violence: Not on file    Physical Exam: Today's Vitals   11/18/22 0857  BP: (!) 180/50  Resp: 19  Temp: 98 F (36.7 C)  TempSrc: Tympanic  SpO2: 100%  Weight: 82.6 kg  Height: 5' 5.5" (1.664 m)  PainSc: 0-No pain   Body mass index is 29.83 kg/m. GEN: NAD EYE: Sclerae anicteric ENT: MMM CV: Non-tachycardic GI: Soft, NT/ND NEURO:  Alert & Oriented x 3  Lab Results: Recent Labs    11/16/22 0951  WBC 6.6  HGB 15.3  HCT 48.7*  PLT 243   BMET Recent Labs    11/16/22 0951  NA 142  K 5.3*  CL 103  CO2 25  GLUCOSE 88  BUN 14  CREATININE 0.81  CALCIUM 10.2   LFT Recent Labs    11/16/22 0951  PROT 6.8  ALBUMIN 4.6  AST 17  ALT 17  ALKPHOS 79  BILITOT 0.4   PT/INR No results for input(s): "LABPROT", "INR" in the last 72 hours.   Impression / Plan: This is a 62 y.o.female who presents for EGD/Colonoscopy with Lower EUS to evaluate Upper  abdominal pain/GERD/Dysphagia and for surveillance of previous colon polyps and history of resected Rectal NET.  The risks and benefits of endoscopic evaluation/treatment were discussed with the patient and/or family; these include but are not limited to the risk of perforation, infection, bleeding, missed lesions, lack of diagnosis, severe illness requiring hospitalization, as well as anesthesia and sedation related illnesses.  The patient's history has been reviewed, patient examined, no change in status, and deemed stable for procedure.  The patient and/or family is agreeable to proceed.    Corliss Parish, MD [facility] Advanced [facility]oscopy Office # [phone]

## 2022-11-18 NOTE — Transfer of Care (Signed)
Immediate Anesthesia Transfer of Care Note  Patient: Shannon [name]  Procedure(s) Performed: COLONOSCOPY WITH PROPOFOL LOWER ENDOSCOPIC ULTRASOUND (EUS) ESOPHAGOGASTRODUODENOSCOPY (EGD) BIOPSY SAVORY DILATION POLYPECTOMY  Patient Location: PACU and Endoscopy Unit  Anesthesia Type:MAC  Level of Consciousness: awake, alert , oriented, and patient cooperative  Airway & Oxygen Therapy: Patient Spontanous Breathing and Patient connected to nasal cannula oxygen  Post-op Assessment: Report given to RN, Post -op Vital signs reviewed and stable, and Patient moving all extremities  Post vital signs: Reviewed and stable  Last Vitals:  Vitals Value Taken Time  BP 112/80 11/18/22 1115  Temp    Pulse 67 11/18/22 1116  Resp 17 11/18/22 1116  SpO2 100 % 11/18/22 1116  Vitals shown include unvalidated device data.  Last Pain:  Vitals:   11/18/22 0857  TempSrc: Tympanic  PainSc: 0-No pain         Complications: No notable events documented.

## 2022-11-18 NOTE — Discharge Instructions (Signed)
YOU HAD AN ENDOSCOPIC PROCEDURE TODAY: Refer to the procedure report and other information in the discharge instructions given to you for any specific questions about what was found during the examination. If this information does not answer your questions, please call [place] office at [phone] to clarify.  ° °YOU SHOULD EXPECT: Some feelings of bloating in the abdomen. Passage of more gas than usual. Walking can help get rid of the air that was put into your GI tract during the procedure and reduce the bloating. If you had a lower endoscopy (such as a colonoscopy or flexible sigmoidoscopy) you may notice spotting of blood in your stool or on the toilet paper. Some abdominal soreness may be present for a day or two, also. ° °DIET: Your first meal following the procedure should be a light meal and then it is ok to progress to your normal diet. A half-sandwich or bowl of soup is an example of a good first meal. Heavy or fried foods are harder to digest and may make you feel nauseous or bloated. Drink plenty of fluids but you should avoid alcoholic beverages for 24 hours. If you had a esophageal dilation, please see attached instructions for diet.   ° °ACTIVITY: Your care partner should take you home directly after the procedure. You should plan to take it easy, moving slowly for the rest of the day. You can resume normal activity the day after the procedure however YOU SHOULD NOT DRIVE, use power tools, machinery or perform tasks that involve climbing or major physical exertion for 24 hours (because of the sedation medicines used during the test).  ° °SYMPTOMS TO REPORT IMMEDIATELY: °A gastroenterologist can be reached at any hour. Please call [phone]  for any of the following symptoms:  °Following lower endoscopy (colonoscopy, flexible sigmoidoscopy) °Excessive amounts of blood in the stool  °Significant tenderness, worsening of abdominal pains  °Swelling of the abdomen that is new, acute  °Fever of 100° or  higher  °Following upper endoscopy (EGD, EUS, ERCP, esophageal dilation) °Vomiting of blood or coffee ground material  °New, significant abdominal pain  °New, significant chest pain or pain under the shoulder blades  °Painful or persistently difficult swallowing  °New shortness of breath  °Black, tarry-looking or red, bloody stools ° °FOLLOW UP:  °If any biopsies were taken you will be contacted by phone or by letter within the next 1-3 weeks. Call [phone]  if you have not heard about the biopsies in 3 weeks.  °Please also call with any specific questions about appointments or follow up tests. ° °

## 2022-11-19 ENCOUNTER — Encounter: Payer: Self-pay | Admitting: Gastroenterology

## 2022-11-19 ENCOUNTER — Other Ambulatory Visit: Payer: Self-pay | Admitting: Physician Assistant

## 2022-11-19 DIAGNOSIS — N3000 Acute cystitis without hematuria: Secondary | ICD-10-CM

## 2022-11-19 LAB — SURGICAL PATHOLOGY

## 2022-11-19 MED ORDER — NITROFURANTOIN MONOHYD MACRO 100 MG PO CAPS
100.0000 mg | ORAL_CAPSULE | Freq: Two times a day (BID) | ORAL | 0 refills | Status: DC
Start: 2022-11-19 — End: 2023-02-09

## 2022-11-22 ENCOUNTER — Encounter (HOSPITAL_COMMUNITY): Payer: Self-pay | Admitting: Gastroenterology

## 2022-11-25 ENCOUNTER — Encounter (HOSPITAL_BASED_OUTPATIENT_CLINIC_OR_DEPARTMENT_OTHER): Payer: Self-pay | Admitting: Pulmonary Disease

## 2022-11-25 ENCOUNTER — Ambulatory Visit (HOSPITAL_BASED_OUTPATIENT_CLINIC_OR_DEPARTMENT_OTHER): Payer: BC Managed Care – PPO | Admitting: Pulmonary Disease

## 2022-11-25 VITALS — BP 120/70 | HR 74 | Temp 98.1°F | Ht 65.5 in | Wt 180.4 lb

## 2022-11-25 DIAGNOSIS — J432 Centrilobular emphysema: Secondary | ICD-10-CM | POA: Diagnosis not present

## 2022-11-25 NOTE — Patient Instructions (Signed)
Emphysema --CONTINUE Albuterol AS NEEDED for shortness of breath or wheezing --ORDER pulmonary function tests for new baseline  Bronchial carcinoid s/p RML lobectomy 2020 No reoccurrence Followed by CTS annually  Peri-operative Assessment of Pulmonary Risk for Non-Thoracic Surgery (Hysterectomy):  For Ms. Montez Morita, risk of perioperative pulmonary complications is increased by:  [ ]  Age greater than 65 years  [X] COPD  [ ]  Serum albumin <3.5  [ ]  Smoking  [ ]  Obstructive sleep apnea  [ ]  NYHA Class II Pulmonary Hypertension  Respiratory complications generally occur in 1% of ASA Class I patients, 5% of ASA Class II and 10% of ASA Class III-IV patients These complications rarely result in mortality and include postoperative pneumonia, atelectasis, pulmonary embolism, ARDS and increased time requiring postoperative mechanical ventilation.  Overall, I recommend proceeding with the surgery if the risk for respiratory complications are outweighed by the potential benefits. This will need to be discussed between the patient and surgeon.  To reduce risks of respiratory complications, I recommend: --Pre- and post-operative incentive spirometry performed frequently while awake --Avoiding use of pancuronium during anesthesia if possible.  I have discussed the risk factors and recommendations above with the patient.

## 2022-11-25 NOTE — Progress Notes (Signed)
Synopsis: Referred in September 2019 for shortness of breath by [name], PA-C  Subjective:   PATIENT ID: Shannon [name] GENDER: female DOB: 13-Apr-1961, MRN: [id number]  Chief Complaint  Patient presents with   Follow-up    Patient here for surgical clearance.     She was diagnosed with bronchitis in July followed by pneumonia. She was treated with antibiotics and prednisone. Unsure how long but she felt better after. She was started on symbicort as well. Once she felt better she stopped taking most of her medications. She started taking her symbicort again she felt worse for a little bit. After stopping the inhaler she felt better. Currently she is exercising and walking more. Not using an inhaler.   Former smoker, quit 12 years ago, history of asthma, lots of second hand smoker, mother was a smoker. She was in the ED as peds for asthma treatments. She did have allergy testing as a child but does not remember any details. Normally had 1-2 episodes of bronchitis with when she was smoking. This has stopped since she stopped smoking.    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  Previously seen by Dr. Tonia Brooms however lost to follow-up with last visit in 2019. Previously walked 3 miles daily however limited due to left hip pain. Denies shortness of breath, cough or wheezing. Shortness of breath with strenuous activity. 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  02/05/22 In the last 6-12 months, she reports shortness of breath and chest tightness that can be triggered by perfumes, strong odors and changes in humidity. She is using her inhaler every other month and has noticed her voice cracking.   11/25/22 She reports less than weekly cough and wheezing associated with allergies. Otherwise baseline respiratory symptoms are stable during other seasons. Uses rescue  inhaler once a week. Not on Spiriva. No exacerbations since our last visit. She is planning for hysterectomy in the near future and requesting pre-op evaluation.  Quit smoking 15 years ago. Started at 12 and stopped in her 4s. 1 ppd x30 years.  Past Medical History:  Diagnosis Date   Anxiety    Arthritis    Asthma    Cancer (HCC)    rectal, Lung   Dyspnea    with exertion   Emphysema of lung (HCC)    patient denies   History of kidney stones    passed   HLD (hyperlipidemia)    Pneumonia 02/2018   Rectal tumor    cancer   Seasonal allergies      Family History  Problem Relation Age of Onset   Heart disease Mother    COPD Mother    Emphysema Mother    Diabetes Father    Colon cancer Neg Hx    Colon polyps Neg Hx    Esophageal cancer Neg Hx    Rectal cancer Neg Hx    Stomach cancer Neg Hx      Past Surgical History:  Procedure Laterality Date   ABLATION     endometrial   BIOPSY  10/10/2018   Procedure: BIOPSY;  Surgeon: Lemar Lofty., MD;  Location: [facility] ENDOSCOPY;  Service: Gastroenterology;;   BIOPSY  06/12/2019   Procedure: BIOPSY;  Surgeon: Lemar Lofty., MD;  Location: [facility] ENDOSCOPY;  Service: Gastroenterology;;   BIOPSY  09/30/2020   Procedure: BIOPSY;  Surgeon: Lemar Lofty., MD;  Location: [facility]  ENDOSCOPY;  Service: Gastroenterology;;   BIOPSY  11/18/2022   Procedure: BIOPSY;  Surgeon: Lemar Lofty., MD;  Location: [facility] ENDOSCOPY;  Service: Gastroenterology;;   COLONOSCOPY     COLONOSCOPY WITH PROPOFOL N/A 11/18/2022   Procedure: COLONOSCOPY WITH PROPOFOL;  Surgeon: Lemar Lofty., MD;  Location: [facility] ENDOSCOPY;  Service: Gastroenterology;  Laterality: N/A;   DOPPLER ECHOCARDIOGRAPHY     ENDOSCOPIC MUCOSAL RESECTION N/A 10/10/2018   Procedure: ENDOSCOPIC MUCOSAL RESECTION;  Surgeon: Meridee Score, Netty Starring., MD;  Location: [facility] ENDOSCOPY;  Service: Gastroenterology;  Laterality: N/A;   ESOPHAGOGASTRODUODENOSCOPY N/A  11/18/2022   Procedure: ESOPHAGOGASTRODUODENOSCOPY (EGD);  Surgeon: Lemar Lofty., MD;  Location: [facility] ENDOSCOPY;  Service: Gastroenterology;  Laterality: N/A;   EUS N/A 07/18/2018   Procedure: LOWER ENDOSCOPIC ULTRASOUND (EUS);  Surgeon: Lemar Lofty., MD;  Location: [facility] ENDOSCOPY;  Service: Gastroenterology;  Laterality: N/A;   EUS N/A 10/10/2018   Procedure: LOWER ENDOSCOPIC ULTRASOUND (EUS);  Surgeon: Lemar Lofty., MD;  Location: [facility] ENDOSCOPY;  Service: Gastroenterology;  Laterality: N/A;   EUS N/A 06/12/2019   Procedure: LOWER ENDOSCOPIC ULTRASOUND (EUS);  Surgeon: Lemar Lofty., MD;  Location: [facility] ENDOSCOPY;  Service: Gastroenterology;  Laterality: N/A;   EUS N/A 09/30/2020   Procedure: LOWER ENDOSCOPIC ULTRASOUND (EUS);  Surgeon: Lemar Lofty., MD;  Location: [facility] ENDOSCOPY;  Service: Gastroenterology;  Laterality: N/A;   EUS N/A 11/18/2022   Procedure: LOWER ENDOSCOPIC ULTRASOUND (EUS);  Surgeon: Lemar Lofty., MD;  Location: [facility] ENDOSCOPY;  Service: Gastroenterology;  Laterality: N/A;   FLEXIBLE SIGMOIDOSCOPY N/A 07/18/2018   Procedure: FLEXIBLE SIGMOIDOSCOPY;  Surgeon: Meridee Score Netty Starring., MD;  Location: [facility] ENDOSCOPY;  Service: Gastroenterology;  Laterality: N/A;   FLEXIBLE SIGMOIDOSCOPY N/A 10/10/2018   Procedure: FLEXIBLE SIGMOIDOSCOPY;  Surgeon: Meridee Score Netty Starring., MD;  Location: [facility] ENDOSCOPY;  Service: Gastroenterology;  Laterality: N/A;   FLEXIBLE SIGMOIDOSCOPY N/A 06/12/2019   Procedure: FLEXIBLE SIGMOIDOSCOPY;  Surgeon: Meridee Score Netty Starring., MD;  Location: [facility] ENDOSCOPY;  Service: Gastroenterology;  Laterality: N/A;   FLEXIBLE SIGMOIDOSCOPY N/A 09/30/2020   Procedure: FLEXIBLE SIGMOIDOSCOPY;  Surgeon: Meridee Score Netty Starring., MD;  Location: [facility] ENDOSCOPY;  Service: Gastroenterology;  Laterality: N/A;   HEMOSTASIS CLIP PLACEMENT  10/10/2018   Procedure: HEMOSTASIS CLIP PLACEMENT;  Surgeon: Lemar Lofty., MD;   Location: [facility] ENDOSCOPY;  Service: Gastroenterology;;   LUNG CANCER SURGERY  12/30/2018   right lun- mid lobe , dr gerhardt   POLYPECTOMY  11/18/2022   Procedure: POLYPECTOMY;  Surgeon: Lemar Lofty., MD;  Location: [facility] ENDOSCOPY;  Service: Gastroenterology;;   Gaspar Bidding DILATION N/A 11/18/2022   Procedure: Jacklyn Shell;  Surgeon: Lemar Lofty., MD;  Location: [facility] ENDOSCOPY;  Service: Gastroenterology;  Laterality: N/A;   SUBMUCOSAL LIFTING INJECTION  10/10/2018   Procedure: SUBMUCOSAL LIFTING INJECTION;  Surgeon: Meridee Score Netty Starring., MD;  Location: [facility] ENDOSCOPY;  Service: Gastroenterology;;   TEE WITHOUT CARDIOVERSION  02/2018   TONSILLECTOMY     TOTAL HIP ARTHROPLASTY Left 11/2021   TUBAL LIGATION  1987   VIDEO ASSISTED THORACOSCOPY (VATS)/WEDGE RESECTION Right 12/30/2018   Procedure: VIDEO ASSISTED THORACOSCOPY (VATS), MINI THORACOTOMY, RIGHT MIDDLE LOBECTOMY WITH LYMPH NODE DISSECTION;  Surgeon: Delight Ovens, MD;  Location: [facility] OR;  Service: Thoracic;  Laterality: Right;   VIDEO BRONCHOSCOPY N/A 10/12/2018   Procedure: VIDEO BRONCHOSCOPY;  Surgeon: Delight Ovens, MD;  Location: [facility] OR;  Service: Thoracic;  Laterality: N/A;   VIDEO BRONCHOSCOPY N/A 12/30/2018   Procedure: VIDEO BRONCHOSCOPY;  Surgeon: Delight Ovens, MD;  Location:  [facility] OR;  Service: Thoracic;  Laterality: N/A;   WISDOM TOOTH EXTRACTION      Social History   Socioeconomic History   Marital status: Married    Spouse name: Not on file   Number of children: Not on file   Years of education: Not on file   Highest education level: GED or equivalent  Occupational History   Not on file  Tobacco Use   Smoking status: Former    Years: 33    Types: Cigarettes    Quit date: 2008    Years since quitting: 16.3   Smokeless tobacco: Never  Vaping Use   Vaping Use: Never used  Substance and Sexual Activity   Alcohol use: Never    Comment: rare   Drug use: Never   Sexual activity: Not  Currently    Birth control/protection: Post-menopausal  Other Topics Concern   Not on file  Social History Narrative   Not on file   Social Determinants of Health   Financial Resource Strain: Low Risk  (11/12/2022)   Overall Financial Resource Strain (CARDIA)    Difficulty of Paying Living Expenses: Not hard at all  Food Insecurity: No Food Insecurity (11/12/2022)   Hunger Vital Sign    Worried About Running Out of Food in the Last Year: Never true    Ran Out of Food in the Last Year: Never true  Transportation Needs: No Transportation Needs (11/12/2022)   PRAPARE - Administrator, Civil Service (Medical): No    Lack of Transportation (Non-Medical): No  Physical Activity: Sufficiently Active (11/12/2022)   Exercise Vital Sign    Days of Exercise per Week: 7 days    Minutes of Exercise per Session: 30 min  Stress: Stress Concern Present (11/12/2022)   [employer] of Occupational Health - Occupational Stress Questionnaire    Feeling of Stress : To some extent  Social Connections: Moderately Isolated (11/12/2022)   Social Connection and Isolation Panel [NHANES]    Frequency of Communication with Friends and Family: Three times a week    Frequency of Social Gatherings with Friends and Family: Once a week    Attends Religious Services: Never    Database administrator or Organizations: No    Attends Engineer, structural: Not on file    Marital Status: Married  Catering manager Violence: Not on file     Allergies  Allergen Reactions   Hydrocodone Nausea Only    "terrible headache"   Pravastatin Other (See Comments)    Muscle/joint paon   Other     UNSPECIFIED REACTION  Dog Dander    Levaquin [Levofloxacin In D5w] Nausea And Vomiting          Outpatient Medications Prior to Visit  Medication Sig Dispense Refill   acetaminophen (TYLENOL) 325 MG tablet Take 650 mg by mouth every 6 (six) hours as needed for moderate pain.     albuterol (VENTOLIN HFA)  108 (90 Base) MCG/ACT inhaler INHALE 2 PUFFS INTO THE LUNGS EVERY 6 HOURS AS NEEDED FOR WHEEZING OR SHORTNESS OF BREATH 6.7 g 1   busPIRone (BUSPAR) 10 MG tablet TAKE 1/2 TABLET BY MOUTH TWICE DAILY 60 tablet 1   calcium carbonate (TUMS - DOSED IN MG ELEMENTAL CALCIUM) 500 MG chewable tablet Chew 1 tablet by mouth daily as needed for indigestion or heartburn.     Cholecalciferol (VITAMIN D-3) 125 MCG (5000 UT) TABS Take 5,000 Units by mouth every evening.     Cyanocobalamin (  B-12) 2500 MCG TABS Take 2,500 mcg by mouth daily.     cyclobenzaprine (FLEXERIL) 10 MG tablet Take 10 mg by mouth 3 (three) times daily as needed for muscle spasms.     D-Mannose 500 MG CAPS Take 1,000 mg by mouth daily.     ferrous sulfate 324 MG TBEC Take 324 mg by mouth daily with breakfast.     gabapentin (NEURONTIN) 300 MG capsule Take 300 mg by mouth at bedtime as needed (pain).     lisinopril (ZESTRIL) 20 MG tablet Take 1 tablet (20 mg total) by mouth daily. 90 tablet 1   LORazepam (ATIVAN) 1 MG tablet TAKE 1 TABLET(1 MG) BY MOUTH AT BEDTIME 90 tablet 0   meloxicam (MOBIC) 7.5 MG tablet TAKE 1 TABLET(7.5 MG) BY MOUTH TWICE DAILY AS NEEDED FOR PAIN 60 tablet 5   nitrofurantoin, macrocrystal-monohydrate, (MACROBID) 100 MG capsule Take 1 capsule (100 mg total) by mouth 2 (two) times daily. 20 capsule 0   omeprazole (PRILOSEC) 40 MG capsule Take 1 capsule (40 mg total) by mouth 2 (two) times daily. Take 30-60 minutes before meals 60 capsule 5   REPATHA SURECLICK 140 MG/ML SOAJ ADMINISTER 1 ML(140 MG) UNDER THE SKIN EVERY 14 DAYS 2 mL 1   famotidine (PEPCID) 20 MG tablet Take 20 mg by mouth every evening.     No facility-administered medications prior to visit.    Review of Systems  Constitutional:  Negative for chills, diaphoresis, fever, malaise/fatigue and weight loss.  HENT:  Negative for congestion.   Respiratory:  Positive for cough and wheezing. Negative for hemoptysis, sputum production and shortness of breath.    Cardiovascular:  Negative for chest pain, palpitations and leg swelling.     Objective:   Blood pressure 120/70, pulse 74, temperature 98.1 F (36.7 C), temperature source Oral, height 5' 5.5" (1.664 m), weight 180 lb 6.4 oz (81.8 kg), SpO2 97 %.  Physical Exam: General: Well-appearing, no acute distress HENT: [place], AT Eyes: EOMI, no scleral icterus Respiratory: Clear to auscultation bilaterally.  No crackles, wheezing or rales Cardiovascular: RRR, -M/R/G, no JVD Extremities:-Edema,-tenderness Neuro: AAO x4, CNII-XII grossly intact Psych: Normal mood, normal affect  Data Reviewed:  Chest Imaging: CT chest 03/02/2018: No evidence of PE, mild centrilobular emphysema, small hiatal hernia. CT chest 05/02/2020-emphysema and right apical scarring.  Status post right middle lobectomy. PET DOTATATE 11/21/2020-no evidence of recurrence of bronchial carcinoid CT Chest 02/06/22 - Emphysema. S/p RML lobectomy. No recurrent carcinoid tumor  PFT: 06/11/18 FVC 3.05 (88%) FEV1 2.17 (80%) ratio 79 TLC 114% RV/TLC 131% DLCO 89% Interpretation: No obstructive or restrictive defect present based on spirometric values.  No significant bronchodilator response however does not preclude the benefit of inhaler therapy.  Air-trapping present however unclear significance in the absence of obstruction.  F-V loops suggestive of airway obstruction.   Cardiac: Echocardiogram 03/23/2018: Left ventricle: The cavity size was normal. Wall thickness was   normal. Systolic function was normal. The estimated ejection   fraction was in the range of 55% to 60%. Wall motion was normal;   there were no regional wall motion abnormalities. Doppler   parameters are consistent with abnormal left ventricular   relaxation (grade 1 diastolic dysfunction).  Treadmill exercise stress test: 03/23/2018: Normal heart rate response with Bruce protocol.  Labs: CBC    Component Value Date/Time   WBC 6.6 11/16/2022 0951   WBC 6.2  02/18/2022 1344   WBC 10.2 01/02/2019 0425   RBC 5.63 (H) 11/16/2022 4098  RBC 4.68 02/18/2022 1344   HGB 15.3 11/16/2022 0951   HCT 48.7 (H) 11/16/2022 0951   PLT 243 11/16/2022 0951   MCV 87 11/16/2022 0951   MCH 27.2 11/16/2022 0951   MCH 28.0 02/18/2022 1344   MCHC 31.4 (L) 11/16/2022 0951   MCHC 32.5 02/18/2022 1344   RDW 15.0 11/16/2022 0951   LYMPHSABS 2.3 11/16/2022 0951   MONOABS 0.5 02/18/2022 1344   EOSABS 0.5 (H) 11/16/2022 0951   BASOSABS 0.0 11/16/2022 0951   Absolute eos 11/16/22 - 500    Assessment & Plan:   No diagnosis found.  Discussion: 62 year old female with childhood asthma, hx of bronchial carcinoid s/p RML lobectomy with recurrence and recurrent URIs who presents for follow-up. Intermittent symptoms controlled on SABA.  Intermittent shortness of breath/wheezing Emphysema --CONTINUE Albuterol AS NEEDED for shortness of breath or wheezing --ORDER pulmonary function tests for new baseline. If stable, schedule next visit every 6-12 months  Bronchial carcinoid s/p RML lobectomy 2020 Last CT 2023 with recurrence. CTS defer further surveillance per Oncology  Peri-operative Assessment of Pulmonary Risk for Non-Thoracic Surgery (Hysterectomy):  For Ms. Montez Morita, risk of perioperative pulmonary complications is increased by:  [ ]  Age greater than 65 years  [X] COPD  [ ]  Serum albumin <3.5  [ ]  Smoking  [ ]  Obstructive sleep apnea  [ ]  NYHA Class II Pulmonary Hypertension  Respiratory complications generally occur in 1% of ASA Class I patients, 5% of ASA Class II and 10% of ASA Class III-IV patients These complications rarely result in mortality and include postoperative pneumonia, atelectasis, pulmonary embolism, ARDS and increased time requiring postoperative mechanical ventilation.  Overall, I recommend proceeding with the surgery if the risk for respiratory complications are outweighed by the potential benefits. This will need to be discussed between the  patient and surgeon.  To reduce risks of respiratory complications, I recommend: --Pre- and post-operative incentive spirometry performed frequently while awake --Avoiding use of pancuronium during anesthesia if possible.  I have discussed the risk factors and recommendations above with the patient.   Current Outpatient Medications:    acetaminophen (TYLENOL) 325 MG tablet, Take 650 mg by mouth every 6 (six) hours as needed for moderate pain., Disp: , Rfl:    albuterol (VENTOLIN HFA) 108 (90 Base) MCG/ACT inhaler, INHALE 2 PUFFS INTO THE LUNGS EVERY 6 HOURS AS NEEDED FOR WHEEZING OR SHORTNESS OF BREATH, Disp: 6.7 g, Rfl: 1   busPIRone (BUSPAR) 10 MG tablet, TAKE 1/2 TABLET BY MOUTH TWICE DAILY, Disp: 60 tablet, Rfl: 1   calcium carbonate (TUMS - DOSED IN MG ELEMENTAL CALCIUM) 500 MG chewable tablet, Chew 1 tablet by mouth daily as needed for indigestion or heartburn., Disp: , Rfl:    Cholecalciferol (VITAMIN D-3) 125 MCG (5000 UT) TABS, Take 5,000 Units by mouth every evening., Disp: , Rfl:    Cyanocobalamin (B-12) 2500 MCG TABS, Take 2,500 mcg by mouth daily., Disp: , Rfl:    cyclobenzaprine (FLEXERIL) 10 MG tablet, Take 10 mg by mouth 3 (three) times daily as needed for muscle spasms., Disp: , Rfl:    D-Mannose 500 MG CAPS, Take 1,000 mg by mouth daily., Disp: , Rfl:    ferrous sulfate 324 MG TBEC, Take 324 mg by mouth daily with breakfast., Disp: , Rfl:    gabapentin (NEURONTIN) 300 MG capsule, Take 300 mg by mouth at bedtime as needed (pain)., Disp: , Rfl:    lisinopril (ZESTRIL) 20 MG tablet, Take 1 tablet (20 mg total) by  mouth daily., Disp: 90 tablet, Rfl: 1   LORazepam (ATIVAN) 1 MG tablet, TAKE 1 TABLET(1 MG) BY MOUTH AT BEDTIME, Disp: 90 tablet, Rfl: 0   meloxicam (MOBIC) 7.5 MG tablet, TAKE 1 TABLET(7.5 MG) BY MOUTH TWICE DAILY AS NEEDED FOR PAIN, Disp: 60 tablet, Rfl: 5   nitrofurantoin, macrocrystal-monohydrate, (MACROBID) 100 MG capsule, Take 1 capsule (100 mg total) by mouth 2  (two) times daily., Disp: 20 capsule, Rfl: 0   omeprazole (PRILOSEC) 40 MG capsule, Take 1 capsule (40 mg total) by mouth 2 (two) times daily. Take 30-60 minutes before meals, Disp: 60 capsule, Rfl: 5   REPATHA SURECLICK 140 MG/ML SOAJ, ADMINISTER 1 ML(140 MG) UNDER THE SKIN EVERY 14 DAYS, Disp: 2 mL, Rfl: 1  I have spent a total time of 35-minutes on the day of the appointment including chart review, data review, collecting history, coordinating care and discussing medical diagnosis and plan with the patient/family. Past medical history, allergies, medications were reviewed. Pertinent imaging, labs and tests included in this note have been reviewed and interpreted independently by me.  Vyom Brass Mechele Collin, MD [facility] 11/25/2022 3:21 PM

## 2022-12-02 ENCOUNTER — Ambulatory Visit (INDEPENDENT_AMBULATORY_CARE_PROVIDER_SITE_OTHER): Payer: BC Managed Care – PPO

## 2022-12-02 DIAGNOSIS — R3915 Urgency of urination: Secondary | ICD-10-CM | POA: Diagnosis not present

## 2022-12-02 LAB — POCT URINALYSIS DIP (CLINITEK)
Glucose, UA: NEGATIVE mg/dL
Ketones, POC UA: NEGATIVE mg/dL
Leukocytes, UA: NEGATIVE
Nitrite, UA: NEGATIVE
Spec Grav, UA: >=1.03 — AB (ref 1.010–1.025)
Urobilinogen, UA: 0.2 E.U./dL
pH, UA: 6 (ref 5.0–8.0)

## 2022-12-02 NOTE — Progress Notes (Signed)
Patient is here to recheck UA. Urine showed trace of blood. Patient denies any fever, chills, suprapubic pain, flank pain, or other symptoms. [name], PA recommended to recheck UA in 3 weeks to see it clear. Patient verbalized to understand.

## 2022-12-23 ENCOUNTER — Ambulatory Visit (INDEPENDENT_AMBULATORY_CARE_PROVIDER_SITE_OTHER): Payer: BC Managed Care – PPO

## 2022-12-23 DIAGNOSIS — R310 Gross hematuria: Secondary | ICD-10-CM | POA: Diagnosis not present

## 2022-12-23 DIAGNOSIS — N3000 Acute cystitis without hematuria: Secondary | ICD-10-CM

## 2022-12-23 LAB — POCT URINALYSIS DIP (CLINITEK)
Blood, UA: NEGATIVE
Glucose, UA: NEGATIVE mg/dL
Leukocytes, UA: NEGATIVE
Nitrite, UA: NEGATIVE
POC PROTEIN,UA: 30 — AB
Spec Grav, UA: >=1.03 — AB (ref 1.010–1.025)
Urobilinogen, UA: 0.2 E.U./dL
pH, UA: 6 (ref 5.0–8.0)

## 2022-12-23 NOTE — Progress Notes (Signed)
Patient came in today for repeat urine check. Patient stated she was not having any concerns at this time.  Per [name], PA-C: Patient urine showed no blood, and make sure patient drink plenty of fluids urine shows she may be dehydrated.   Patient Made Aware, Verbalized Understanding.

## 2022-12-29 ENCOUNTER — Other Ambulatory Visit: Payer: Self-pay | Admitting: Physician Assistant

## 2022-12-29 ENCOUNTER — Other Ambulatory Visit: Payer: Self-pay

## 2022-12-29 DIAGNOSIS — E782 Mixed hyperlipidemia: Secondary | ICD-10-CM

## 2022-12-29 MED ORDER — REPATHA SURECLICK 140 MG/ML ~~LOC~~ SOAJ
SUBCUTANEOUS | 1 refills | Status: DC
Start: 2022-12-29 — End: 2023-02-16

## 2023-01-07 ENCOUNTER — Ambulatory Visit (HOSPITAL_BASED_OUTPATIENT_CLINIC_OR_DEPARTMENT_OTHER): Payer: BC Managed Care – PPO | Admitting: Pulmonary Disease

## 2023-01-07 ENCOUNTER — Encounter (HOSPITAL_BASED_OUTPATIENT_CLINIC_OR_DEPARTMENT_OTHER): Payer: BC Managed Care – PPO

## 2023-01-20 ENCOUNTER — Other Ambulatory Visit: Payer: Self-pay | Admitting: Physician Assistant

## 2023-01-20 ENCOUNTER — Encounter (HOSPITAL_COMMUNITY): Payer: BC Managed Care – PPO

## 2023-01-20 DIAGNOSIS — F419 Anxiety disorder, unspecified: Secondary | ICD-10-CM

## 2023-01-21 ENCOUNTER — Ambulatory Visit (HOSPITAL_BASED_OUTPATIENT_CLINIC_OR_DEPARTMENT_OTHER): Payer: BC Managed Care – PPO | Admitting: Pulmonary Disease

## 2023-01-21 ENCOUNTER — Encounter (HOSPITAL_BASED_OUTPATIENT_CLINIC_OR_DEPARTMENT_OTHER): Payer: BC Managed Care – PPO

## 2023-01-27 ENCOUNTER — Telehealth: Payer: Self-pay | Admitting: Gastroenterology

## 2023-01-27 ENCOUNTER — Encounter: Payer: Self-pay | Admitting: Internal Medicine

## 2023-01-27 NOTE — Telephone Encounter (Signed)
Per last procedure note Dr. Meridee Score stated for pt to follow up with primary GI and have EGD done in which would be in August. Please advise if pt can be scheduled for a direct EGD.

## 2023-01-27 NOTE — Telephone Encounter (Signed)
PT is calling to discuss the referral she is supposed to get from Dr. Meridee Score to go to Dr. Marina Goodell following EGD 4/24. Requesting call back

## 2023-01-27 NOTE — Telephone Encounter (Signed)
Please schedule pt for direct EGD with previsit in the [facility] in August, see note below.

## 2023-01-27 NOTE — Telephone Encounter (Signed)
Yes, please set the patient up for direct EGD in the [facility] at the appropriate time.  Thanks

## 2023-01-27 NOTE — Telephone Encounter (Signed)
Patient returned call and was scheduled for PV on 8/20 at 10:00. Patient stated she was having a hysterectomy on 8/8 and would like sometime to recover. Patient was scheduled for EGD on 9/3 at 9:00.

## 2023-01-27 NOTE — Telephone Encounter (Signed)
Shannon [name] this is a pt that Dr Meridee Score did an EUS on and referred back to Dr Marina Goodell.   "I recommend a repeat upper endoscopy with your primary gastroenterologist in the next 4 months to ensure healing of the gastric ulcer and reevaluate the duodenal nodule.  If the duodenal nodule is still present, we will consider the role of an upper endoscopic ultrasound."

## 2023-01-27 NOTE — Telephone Encounter (Signed)
Left VM to call and schedule direct EGD with Dr. Marina Goodell

## 2023-02-04 ENCOUNTER — Encounter (HOSPITAL_BASED_OUTPATIENT_CLINIC_OR_DEPARTMENT_OTHER): Payer: Self-pay | Admitting: Obstetrics and Gynecology

## 2023-02-08 ENCOUNTER — Ambulatory Visit (HOSPITAL_COMMUNITY)
Admission: RE | Admit: 2023-02-08 | Discharge: 2023-02-08 | Disposition: A | Discharge status: Home / Self Care | Payer: BC Managed Care – PPO | Source: Ambulatory Visit | Attending: Pulmonary Disease | Admitting: Pulmonary Disease

## 2023-02-08 DIAGNOSIS — J432 Centrilobular emphysema: Secondary | ICD-10-CM | POA: Diagnosis not present

## 2023-02-08 LAB — PULMONARY FUNCTION TEST
DL/VA % pred: 94 %
DL/VA: 3.94 ml/min/mmHg/L
DLCO unc % pred: 83 %
DLCO unc: 17.3 ml/min/mmHg
FEF 25-75 Post: 1.37 L/sec
FEF 25-75 Pre: 1.06 L/sec
FEF2575-%Change-Post: 29 %
FEF2575-%Pred-Post: 59 %
FEF2575-%Pred-Pre: 45 %
FEV1-%Change-Post: 7 %
FEV1-%Pred-Post: 76 %
FEV1-%Pred-Pre: 71 %
FEV1-Post: 1.99 L
FEV1-Pre: 1.85 L
FEV1FVC-%Change-Post: 0 %
FEV1FVC-%Pred-Pre: 83 %
FEV6-%Change-Post: 7 %
FEV6-%Pred-Post: 94 %
FEV6-%Pred-Pre: 87 %
FEV6-Post: 3.06 L
FEV6-Pre: 2.85 L
FEV6FVC-%Change-Post: 0 %
FEV6FVC-%Pred-Post: 102 %
FEV6FVC-%Pred-Pre: 103 %
FVC-%Change-Post: 7 %
FVC-%Pred-Post: 91 %
FVC-%Pred-Pre: 85 %
FVC-Post: 3.09 L
FVC-Pre: 2.87 L
Post FEV1/FVC ratio: 65 %
Post FEV6/FVC ratio: 99 %
Pre FEV1/FVC ratio: 65 %
Pre FEV6/FVC Ratio: 99 %
RV % pred: 222 %
RV: 4.63 L
TLC % pred: 141 %
TLC: 7.35 L

## 2023-02-08 MED ORDER — ALBUTEROL SULFATE (2.5 MG/3ML) 0.083% IN NEBU
2.5000 mg | INHALATION_SOLUTION | Freq: Once | RESPIRATORY_TRACT | Status: AC
  Administered 2023-02-08: 2.5 mg via RESPIRATORY_TRACT

## 2023-02-09 ENCOUNTER — Ambulatory Visit (HOSPITAL_BASED_OUTPATIENT_CLINIC_OR_DEPARTMENT_OTHER): Payer: BC Managed Care – PPO | Admitting: Pulmonary Disease

## 2023-02-09 ENCOUNTER — Encounter (HOSPITAL_BASED_OUTPATIENT_CLINIC_OR_DEPARTMENT_OTHER): Payer: Self-pay | Admitting: Pulmonary Disease

## 2023-02-09 VITALS — BP 124/80 | HR 66 | Temp 98.0°F | Ht 65.5 in | Wt 192.8 lb

## 2023-02-09 DIAGNOSIS — Z01811 Encounter for preprocedural respiratory examination: Secondary | ICD-10-CM | POA: Diagnosis not present

## 2023-02-09 DIAGNOSIS — J432 Centrilobular emphysema: Secondary | ICD-10-CM

## 2023-02-09 MED ORDER — PREDNISONE 10 MG PO TABS: 30.0000 mg | ORAL_TABLET | Freq: Every day | ORAL | 0 refills | Status: AC

## 2023-02-09 MED ORDER — STIOLTO RESPIMAT 2.5-2.5 MCG/ACT IN AERS: 2.0000 | INHALATION_SPRAY | Freq: Every day | RESPIRATORY_TRACT | Status: DC

## 2023-02-09 NOTE — Patient Instructions (Addendum)
Intermittent shortness of breath/wheezing Emphysema --Prednisone 30 mg x 3 days --START Stiolto TWO puffs in the morning. 1 month sample provided. Please call us if you want a refill --CONTINUE Albuterol AS NEEDED for shortness of breath or wheezing   Peri-operative Assessment of Pulmonary Risk for Non-Thoracic Surgery (Hysterectomy):  For Ms. Montez Morita, risk of perioperative pulmonary complications is increased by:  [ ]  Age greater than 65 years  [X] COPD  [ ]  Serum albumin <3.5  [ ]  Smoking  [ ]  Obstructive sleep apnea  [ ]  NYHA Class II Pulmonary Hypertension  Respiratory complications generally occur in 1% of ASA Class I patients, 5% of ASA Class II and 10% of ASA Class III-IV patients These complications rarely result in mortality and include postoperative pneumonia, atelectasis, pulmonary embolism, ARDS and increased time requiring postoperative mechanical ventilation.  Overall, I recommend proceeding with the surgery if the risk for respiratory complications are outweighed by the potential benefits. This will need to be discussed between the patient and surgeon.  To reduce risks of respiratory complications, I recommend: --Pre- and post-operative incentive spirometry performed frequently while awake --Avoiding use of pancuronium during anesthesia if possible.  I have discussed the risk factors and recommendations above with the patient.

## 2023-02-09 NOTE — Progress Notes (Unsigned)
Synopsis: Referred in September 2019 for shortness of breath by Shannon Sofia, PA-C  Subjective:   PATIENT ID: Shannon [name] GENDER: female DOB: 1961/03/15, MRN: [id number]  Chief Complaint  Patient presents with   Follow-up    Review PFT from 02/09/23.  Breathing is overall doing well. Humid weather does make breathing slightly worse. She is using her albuterol inhaler every night before bed.     She was diagnosed with bronchitis in July followed by pneumonia. She was treated with antibiotics and prednisone. Unsure how long but she felt better after. She was started on symbicort as well. Once she felt better she stopped taking most of her medications. She started taking her symbicort again she felt worse for a little bit. After stopping the inhaler she felt better. Currently she is exercising and walking more. Not using an inhaler.   Former smoker, quit 12 years ago, history of asthma, lots of second hand smoker, mother was a smoker. She was in the ED as peds for asthma treatments. She did have allergy testing as a child but does not remember any details. Normally had 1-2 episodes of bronchitis with when she was smoking. This has stopped since she stopped smoking.    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  Previously seen by Shannon [name] however lost to follow-up with last visit in 2019. Previously walked 3 miles daily however limited due to left hip pain. Denies shortness of breath, cough or wheezing. Shortness of breath with strenuous activity. 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  02/05/22 In the last 6-12 months, she reports shortness of breath and chest tightness that can be triggered by perfumes, strong odors and changes in humidity. She is using her inhaler every other month and has noticed her voice cracking.   11/25/22 She reports less than weekly  cough and wheezing associated with allergies. Otherwise baseline respiratory symptoms are stable during other seasons. Uses rescue inhaler once a week. Not on Spiriva. No exacerbations since our last visit. She is planning for hysterectomy in the near future and requesting pre-op evaluation.  02/09/23 She reports when she is triggered by strong odors, humidity and activity she will have shortness of breath and chest tightness. Able to perform ADLs and care for house and herself. She reports taking albuterol nightly. She walks daily and currently in PT for her back. Still wheezing occasionally. Planning for her hysterectomy next month. Since our PFT test yesterday she reports feeling chest tightness worsened by humidity  Quit smoking 15 years ago. Started at 12 and stopped in her 55s. 1 ppd x30 years.  Past Medical History:  Diagnosis Date   Anxiety    Follows w/ PCP.   Arthritis    Asthma    Follows w/ pulmonology, Shannon [name].   Bronchial carcinoid tumors    s/p right middle lobe lobectomy   Cancer (HCC)    rectal, Lung   Dyspnea    with exertion   Emphysema of lung [facility])    Follows w/ pulmonology, Shannon [name].   History of kidney stones    passed   HLD (hyperlipidemia)    Follows w/ PCP.   Pneumonia 02/2018   Rectal tumor    neuroendocrine carcinoma of rectum, follows w/ GI , Dr. Sherre [name] and Dr. Shirline [name] at [facility].   Seasonal allergies  Family History  Problem Relation Age of Onset   Heart disease Mother    COPD Mother    Emphysema Mother    Diabetes Father    Colon cancer Neg Hx    Colon polyps Neg Hx    Esophageal cancer Neg Hx    Rectal cancer Neg Hx    Stomach cancer Neg Hx      Past Surgical History:  Procedure Laterality Date   ABLATION     endometrial   BIOPSY  10/10/2018   Procedure: BIOPSY;  Surgeon: Shannon Lofty., [name];  Location: [facility] ENDOSCOPY;  Service: Gastroenterology;;   BIOPSY  06/12/2019    Procedure: BIOPSY;  Surgeon: Shannon Lofty., [name];  Location: [facility] ENDOSCOPY;  Service: Gastroenterology;;   BIOPSY  09/30/2020   Procedure: BIOPSY;  Surgeon: Shannon Lofty., [name];  Location: [facility] ENDOSCOPY;  Service: Gastroenterology;;   BIOPSY  11/18/2022   Procedure: BIOPSY;  Surgeon: Shannon Lofty., [name];  Location: [facility] ENDOSCOPY;  Service: Gastroenterology;;   COLONOSCOPY     COLONOSCOPY WITH PROPOFOL N/A 11/18/2022   Procedure: COLONOSCOPY WITH PROPOFOL;  Surgeon: Shannon Lofty., [name];  Location: [facility] ENDOSCOPY;  Service: Gastroenterology;  Laterality: N/A;   DOPPLER ECHOCARDIOGRAPHY     ENDOSCOPIC MUCOSAL RESECTION N/A 10/10/2018   Procedure: ENDOSCOPIC MUCOSAL RESECTION;  Surgeon: Shannon [name];  Location: [facility] ENDOSCOPY;  Service: Gastroenterology;  Laterality: N/A;   ESOPHAGOGASTRODUODENOSCOPY N/A 11/18/2022   Procedure: ESOPHAGOGASTRODUODENOSCOPY (EGD);  Surgeon: Shannon Lofty., [name];  Location: [facility] ENDOSCOPY;  Service: Gastroenterology;  Laterality: N/A;   EUS N/A 07/18/2018   Procedure: LOWER ENDOSCOPIC ULTRASOUND (EUS);  Surgeon: Shannon Lofty., [name];  Location: [facility] ENDOSCOPY;  Service: Gastroenterology;  Laterality: N/A;   EUS N/A 10/10/2018   Procedure: LOWER ENDOSCOPIC ULTRASOUND (EUS);  Surgeon: Shannon Lofty., [name];  Location: [facility] ENDOSCOPY;  Service: Gastroenterology;  Laterality: N/A;   EUS N/A 06/12/2019   Procedure: LOWER ENDOSCOPIC ULTRASOUND (EUS);  Surgeon: Shannon Lofty., [name];  Location: [facility] ENDOSCOPY;  Service: Gastroenterology;  Laterality: N/A;   EUS N/A 09/30/2020   Procedure: LOWER ENDOSCOPIC ULTRASOUND (EUS);  Surgeon: Shannon Lofty., [name];  Location: [facility] ENDOSCOPY;  Service: Gastroenterology;  Laterality: N/A;   EUS N/A 11/18/2022   Procedure: LOWER ENDOSCOPIC ULTRASOUND (EUS);  Surgeon: Shannon Lofty., [name];  Location: [facility] ENDOSCOPY;  Service: Gastroenterology;  Laterality: N/A;   FLEXIBLE  SIGMOIDOSCOPY N/A 07/18/2018   Procedure: FLEXIBLE SIGMOIDOSCOPY;  Surgeon: Shannon Score Netty Starring., [name];  Location: [facility] ENDOSCOPY;  Service: Gastroenterology;  Laterality: N/A;   FLEXIBLE SIGMOIDOSCOPY N/A 10/10/2018   Procedure: FLEXIBLE SIGMOIDOSCOPY;  Surgeon: Shannon Score Netty Starring., [name];  Location: [facility] ENDOSCOPY;  Service: Gastroenterology;  Laterality: N/A;   FLEXIBLE SIGMOIDOSCOPY N/A 06/12/2019   Procedure: FLEXIBLE SIGMOIDOSCOPY;  Surgeon: Shannon Score Netty Starring., [name];  Location: [facility] ENDOSCOPY;  Service: Gastroenterology;  Laterality: N/A;   FLEXIBLE SIGMOIDOSCOPY N/A 09/30/2020   Procedure: FLEXIBLE SIGMOIDOSCOPY;  Surgeon: Shannon Score Netty Starring., [name];  Location: [facility] ENDOSCOPY;  Service: Gastroenterology;  Laterality: N/A;   HEMOSTASIS CLIP PLACEMENT  10/10/2018   Procedure: HEMOSTASIS CLIP PLACEMENT;  Surgeon: Shannon Lofty., [name];  Location: [facility] ENDOSCOPY;  Service: Gastroenterology;;   LUNG CANCER SURGERY  12/30/2018   right lun- mid lobe , dr gerhardt   POLYPECTOMY  11/18/2022   Procedure: POLYPECTOMY;  Surgeon: Shannon Lofty., [name];  Location: [facility] ENDOSCOPY;  Service: Gastroenterology;;   Gaspar Bidding DILATION N/A 11/18/2022   Procedure: Jacklyn Shell;  Surgeon: Shannon Lofty., [name];  Location: [facility]  ENDOSCOPY;  Service: Gastroenterology;  Laterality: N/A;   SUBMUCOSAL LIFTING INJECTION  10/10/2018   Procedure: SUBMUCOSAL LIFTING INJECTION;  Surgeon: Shannon Score Netty Starring., [name];  Location: [facility] ENDOSCOPY;  Service: Gastroenterology;;   TEE WITHOUT CARDIOVERSION  02/2018   TONSILLECTOMY     TOTAL HIP ARTHROPLASTY Left 11/2021   TUBAL LIGATION  1987   VIDEO ASSISTED THORACOSCOPY (VATS)/WEDGE RESECTION Right 12/30/2018   Procedure: VIDEO ASSISTED THORACOSCOPY (VATS), MINI THORACOTOMY, RIGHT MIDDLE LOBECTOMY WITH LYMPH NODE DISSECTION;  Surgeon: Delight Ovens, [name];  Location: [facility] OR;  Service: Thoracic;  Laterality: Right;   VIDEO BRONCHOSCOPY N/A 10/12/2018   Procedure:  VIDEO BRONCHOSCOPY;  Surgeon: Delight Ovens, [name];  Location: [facility] OR;  Service: Thoracic;  Laterality: N/A;   VIDEO BRONCHOSCOPY N/A 12/30/2018   Procedure: VIDEO BRONCHOSCOPY;  Surgeon: Delight Ovens, [name];  Location: [facility] OR;  Service: Thoracic;  Laterality: N/A;   WISDOM TOOTH EXTRACTION      Social History   Socioeconomic History   Marital status: Married    Spouse name: Not on file   Number of children: Not on file   Years of education: Not on file   Highest education level: GED or equivalent  Occupational History   Not on file  Tobacco Use   Smoking status: Former    Current packs/day: 0.00    Average packs/day: 1 pack/day for 33.0 years (33.0 ttl pk-yrs)    Types: Cigarettes    Start date: 75    Quit date: 2008    Years since quitting: 16.5   Smokeless tobacco: Never  Vaping Use   Vaping status: Never Used  Substance and Sexual Activity   Alcohol use: Yes    Comment: rare   Drug use: Never   Sexual activity: Not Currently    Birth control/protection: Post-menopausal  Other Topics Concern   Not on file  Social History Narrative   Not on file   Social Determinants of Health   Financial Resource Strain: Low Risk  (11/12/2022)   Overall Financial Resource Strain (CARDIA)    Difficulty of Paying Living Expenses: Not hard at all  Food Insecurity: No Food Insecurity (11/12/2022)   Hunger Vital Sign    Worried About Running Out of Food in the Last Year: Never true    Ran Out of Food in the Last Year: Never true  Transportation Needs: No Transportation Needs (11/12/2022)   PRAPARE - Administrator, Civil Service (Medical): No    Lack of Transportation (Non-Medical): No  Physical Activity: Sufficiently Active (11/12/2022)   Exercise Vital Sign    Days of Exercise per Week: 7 days    Minutes of Exercise per Session: 30 min  Stress: Stress Concern Present (11/12/2022)   [employer] of Occupational Health - Occupational Stress Questionnaire     Feeling of Stress : To some extent  Social Connections: Moderately Isolated (11/12/2022)   Social Connection and Isolation Panel [NHANES]    Frequency of Communication with Friends and Family: Three times a week    Frequency of Social Gatherings with Friends and Family: Once a week    Attends Religious Services: Never    Database administrator or Organizations: No    Attends Engineer, structural: Not on file    Marital Status: Married  Catering manager Violence: Not on file     Allergies  Allergen Reactions   Hydrocodone Nausea Only and Other (See Comments)    "terrible headache"   Pravastatin Other (  See Comments)    Muscle/joint paon   Levaquin [Levofloxacin In D5w] Nausea And Vomiting        Other      Dog Dander      Outpatient Medications Prior to Visit  Medication Sig Dispense Refill   acetaminophen (TYLENOL) 325 MG tablet Take 650 mg by mouth every 6 (six) hours as needed for moderate pain.     albuterol (VENTOLIN HFA) 108 (90 Base) MCG/ACT inhaler INHALE 2 PUFFS INTO THE LUNGS EVERY 6 HOURS AS NEEDED FOR WHEEZING OR SHORTNESS OF BREATH 6.7 g 1   busPIRone (BUSPAR) 10 MG tablet TAKE 1/2 TABLET BY MOUTH TWICE DAILY 60 tablet 1   calcium carbonate (TUMS - DOSED IN MG ELEMENTAL CALCIUM) 500 MG chewable tablet Chew 1 tablet by mouth daily as needed for indigestion or heartburn.     Cholecalciferol (VITAMIN D-3) 125 MCG (5000 UT) TABS Take 5,000 Units by mouth every evening.     Cyanocobalamin (B-12) 2500 MCG TABS Take 2,500 mcg by mouth daily.     cyclobenzaprine (FLEXERIL) 10 MG tablet Take 10 mg by mouth 3 (three) times daily as needed for muscle spasms.     Evolocumab (REPATHA SURECLICK) 140 MG/ML SOAJ Inject once every 2 weeks 2 mL 1   ferrous sulfate 324 MG TBEC Take 324 mg by mouth daily with breakfast.     gabapentin (NEURONTIN) 300 MG capsule Take 300 mg by mouth at bedtime as needed (pain).     lisinopril (ZESTRIL) 20 MG tablet Take 1 tablet (20 mg total) by  mouth daily. 90 tablet 1   LORazepam (ATIVAN) 1 MG tablet TAKE 1 TABLET(1 MG) BY MOUTH AT BEDTIME 90 tablet 0   meloxicam (MOBIC) 7.5 MG tablet TAKE 1 TABLET(7.5 MG) BY MOUTH TWICE DAILY AS NEEDED FOR PAIN 60 tablet 5   omeprazole (PRILOSEC) 40 MG capsule Take 1 capsule (40 mg total) by mouth 2 (two) times daily. Take 30-60 minutes before meals 60 capsule 5   D-Mannose 500 MG CAPS Take 1,000 mg by mouth daily.     nitrofurantoin, macrocrystal-monohydrate, (MACROBID) 100 MG capsule Take 1 capsule (100 mg total) by mouth 2 (two) times daily. 20 capsule 0   No facility-administered medications prior to visit.    Review of Systems  Constitutional:  Negative for chills, diaphoresis, fever, malaise/fatigue and weight loss.  HENT:  Negative for congestion.   Respiratory:  Positive for cough and wheezing. Negative for hemoptysis, sputum production and shortness of breath.   Cardiovascular:  Negative for chest pain, palpitations and leg swelling.     Objective:   Blood pressure 124/80, pulse 66, temperature 98 F (36.7 C), temperature source Oral, height 5' 5.5" (1.664 m), weight 192 lb 12.8 oz (87.5 kg), SpO2 98%.  Physical Exam: General: Well-appearing, no acute distress HENT: [place], AT Eyes: EOMI, no scleral icterus Respiratory: Clear to auscultation bilaterally.  No crackles, wheezing or rales Cardiovascular: RRR, -M/R/G, no JVD Extremities:-Edema,-tenderness Neuro: AAO x4, CNII-XII grossly intact Psych: Normal mood, normal affect  Data Reviewed:  Chest Imaging: CT chest 03/02/2018: No evidence of PE, mild centrilobular emphysema, small hiatal hernia. CT chest 05/02/2020-emphysema and right apical scarring.  Status post right middle lobectomy. PET DOTATATE 11/21/2020-no evidence of recurrence of bronchial carcinoid CT Chest 02/06/22 - Emphysema. S/p RML lobectomy. No recurrent carcinoid tumor  PFT: 06/11/18 FVC 3.05 (88%) FEV1 2.17 (80%) ratio 79 TLC 114% RV/TLC 131% DLCO  89% Interpretation: No obstructive or restrictive defect present based on spirometric values.  No significant bronchodilator response however does not preclude the benefit of inhaler therapy.  Air-trapping present however unclear significance in the absence of obstruction.  F-V loops suggestive of airway obstruction.  02/09/23 FVC *** (***%) FEV1 *** (***%) Ratio ***  TLC ***% DLCO ***% Interpretation: ***    Cardiac: Echocardiogram 03/23/2018: Left ventricle: The cavity size was normal. Wall thickness was   normal. Systolic function was normal. The estimated ejection   fraction was in the range of 55% to 60%. Wall motion was normal;   there were no regional wall motion abnormalities. Doppler   parameters are consistent with abnormal left ventricular   relaxation (grade 1 diastolic dysfunction).  Treadmill exercise stress test: 03/23/2018: Normal heart rate response with Bruce protocol.  Labs: CBC    Component Value Date/Time   WBC 6.6 11/16/2022 0951   WBC 6.2 02/18/2022 1344   WBC 10.2 01/02/2019 0425   RBC 5.63 (H) 11/16/2022 0951   RBC 4.68 02/18/2022 1344   HGB 15.3 11/16/2022 0951   HCT 48.7 (H) 11/16/2022 0951   PLT 243 11/16/2022 0951   MCV 87 11/16/2022 0951   MCH 27.2 11/16/2022 0951   MCH 28.0 02/18/2022 1344   MCHC 31.4 (L) 11/16/2022 0951   MCHC 32.5 02/18/2022 1344   RDW 15.0 11/16/2022 0951   LYMPHSABS 2.3 11/16/2022 0951   MONOABS 0.5 02/18/2022 1344   EOSABS 0.5 (H) 11/16/2022 0951   BASOSABS 0.0 11/16/2022 0951   Absolute eos 11/16/22 - 500    Assessment & Plan:   No diagnosis found.  Discussion: 62 year old female with childhood asthma, hx of bronchial carcinoid s/p RML lobectomy with recurrence and recurrent URIs who presents for follow-up. Intermittent symptoms controlled on SABA.  Reviewed PFTs with mild obstructive defect and air trapping. Recommmend LAMA/LABA trial. She had tried Symbicort   Intermittent shortness of  breath/wheezing Emphysema - mild exacerbation --START Stiolto TWO puffs in the morning. 1 month sample provided. Please call us if you want a refill --CONTINUE Albuterol AS NEEDED for shortness of breath or wheezing  Bronchial carcinoid s/p RML lobectomy 2020 Last CT 2023 with recurrence. CTS defer further surveillance per Oncology  Peri-operative Assessment of Pulmonary Risk for Non-Thoracic Surgery (Hysterectomy):  For Ms. Montez Morita, risk of perioperative pulmonary complications is increased by:  [ ]  Age greater than 65 years  [X] COPD  [ ]  Serum albumin <3.5  [ ]  Smoking  [ ]  Obstructive sleep apnea  [ ]  NYHA Class II Pulmonary Hypertension  Respiratory complications generally occur in 1% of ASA Class I patients, 5% of ASA Class II and 10% of ASA Class III-IV patients These complications rarely result in mortality and include postoperative pneumonia, atelectasis, pulmonary embolism, ARDS and increased time requiring postoperative mechanical ventilation.  Overall, I recommend proceeding with the surgery if the risk for respiratory complications are outweighed by the potential benefits. This will need to be discussed between the patient and surgeon.  To reduce risks of respiratory complications, I recommend: --Pre- and post-operative incentive spirometry performed frequently while awake --Avoiding use of pancuronium during anesthesia if possible.  I have discussed the risk factors and recommendations above with the patient.   Current Outpatient Medications:    acetaminophen (TYLENOL) 325 MG tablet, Take 650 mg by mouth every 6 (six) hours as needed for moderate pain., Disp: , Rfl:    albuterol (VENTOLIN HFA) 108 (90 Base) MCG/ACT inhaler, INHALE 2 PUFFS INTO THE LUNGS EVERY 6 HOURS AS NEEDED FOR WHEEZING OR SHORTNESS OF  BREATH, Disp: 6.7 g, Rfl: 1   busPIRone (BUSPAR) 10 MG tablet, TAKE 1/2 TABLET BY MOUTH TWICE DAILY, Disp: 60 tablet, Rfl: 1   calcium carbonate (TUMS - DOSED IN MG  ELEMENTAL CALCIUM) 500 MG chewable tablet, Chew 1 tablet by mouth daily as needed for indigestion or heartburn., Disp: , Rfl:    Cholecalciferol (VITAMIN D-3) 125 MCG (5000 UT) TABS, Take 5,000 Units by mouth every evening., Disp: , Rfl:    Cyanocobalamin (B-12) 2500 MCG TABS, Take 2,500 mcg by mouth daily., Disp: , Rfl:    cyclobenzaprine (FLEXERIL) 10 MG tablet, Take 10 mg by mouth 3 (three) times daily as needed for muscle spasms., Disp: , Rfl:    Evolocumab (REPATHA SURECLICK) 140 MG/ML SOAJ, Inject once every 2 weeks, Disp: 2 mL, Rfl: 1   ferrous sulfate 324 MG TBEC, Take 324 mg by mouth daily with breakfast., Disp: , Rfl:    gabapentin (NEURONTIN) 300 MG capsule, Take 300 mg by mouth at bedtime as needed (pain)., Disp: , Rfl:    lisinopril (ZESTRIL) 20 MG tablet, Take 1 tablet (20 mg total) by mouth daily., Disp: 90 tablet, Rfl: 1   LORazepam (ATIVAN) 1 MG tablet, TAKE 1 TABLET(1 MG) BY MOUTH AT BEDTIME, Disp: 90 tablet, Rfl: 0   meloxicam (MOBIC) 7.5 MG tablet, TAKE 1 TABLET(7.5 MG) BY MOUTH TWICE DAILY AS NEEDED FOR PAIN, Disp: 60 tablet, Rfl: 5   omeprazole (PRILOSEC) 40 MG capsule, Take 1 capsule (40 mg total) by mouth 2 (two) times daily. Take 30-60 minutes before meals, Disp: 60 capsule, Rfl: 5  I have spent a total time of 35-minutes on the day of the appointment including chart review, data review, collecting history, coordinating care and discussing medical diagnosis and plan with the patient/family. Past medical history, allergies, medications were reviewed. Pertinent imaging, labs and tests included in this note have been reviewed and interpreted independently by me.  Eshal Propps Mechele Collin, [name] [place] [facility] 02/09/2023 3:25 PM

## 2023-02-10 ENCOUNTER — Encounter (HOSPITAL_BASED_OUTPATIENT_CLINIC_OR_DEPARTMENT_OTHER): Payer: Self-pay | Admitting: Obstetrics and Gynecology

## 2023-02-10 ENCOUNTER — Other Ambulatory Visit: Payer: Self-pay

## 2023-02-10 NOTE — Progress Notes (Signed)
Your procedure is scheduled on Thursday, 03/04/2023.  Report to [facility] [place] AT  7:45 AM.   Call this number if you have problems the morning of surgery  :[phone].   OUR ADDRESS IS [address].  WE ARE LOCATED IN [facility].  PLEASE BRING YOUR INSURANCE CARD AND PHOTO ID DAY OF SURGERY.  ONLY 2 PEOPLE ARE ALLOWED IN  WAITING  ROOM                                      REMEMBER:  DO NOT EAT FOOD, CANDY GUM OR MINTS  AFTER MIDNIGHT THE NIGHT BEFORE YOUR SURGERY . YOU MAY HAVE CLEAR LIQUIDS FROM MIDNIGHT THE NIGHT BEFORE YOUR SURGERY UNTIL  6:45 AM. NO CLEAR LIQUIDS AFTER   6:45 AM DAY OF SURGERY.  YOU MAY  BRUSH YOUR TEETH MORNING OF SURGERY AND RINSE YOUR MOUTH OUT, NO CHEWING GUM CANDY OR MINTS.     CLEAR LIQUID DIET    Allowed      Water                                                                   Coffee and tea, regular and decaf  (NO cream or milk products of any type, may sweeten)                         Carbonated beverages, regular and diet                                    Sports drinks like Gatorade _____________________________________________________________________     TAKE ONLY THESE MEDICATIONS MORNING OF SURGERY: Albuterol inhaler, Stiolto Respimat inhaler if needed, Buspar, Flexeril if needed, Omeprazole   Please bring albuterol inhaler with you on the day of surgery.                                        DO NOT WEAR JEWERLY/  METAL/  PIERCINGS (INCLUDING NO PLASTIC PIERCINGS) DO NOT WEAR LOTIONS, POWDERS, PERFUMES OR NAIL POLISH ON YOUR FINGERNAILS. TOENAIL POLISH IS OK TO WEAR. DO NOT SHAVE FOR 48 HOURS PRIOR TO DAY OF SURGERY.  CONTACTS, GLASSES, OR DENTURES MAY NOT BE WORN TO SURGERY.  REMEMBER: NO SMOKING, VAPING ,  DRUGS OR ALCOHOL FOR 24 HOURS BEFORE YOUR SURGERY.                                    [facility] IS NOT RESPONSIBLE  FOR ANY BELONGINGS.                                                                     Marland Kitchen  [facility] - Preparing for Surgery Before surgery, you can play an important role.  Because skin is not sterile, your skin needs to be as free of germs as possible.  You can reduce the number of germs on your skin by washing with CHG (chlorahexidine gluconate) soap before surgery.  CHG is an antiseptic cleaner which kills germs and bonds with the skin to continue killing germs even after washing. Please DO NOT use if you have an allergy to CHG or antibacterial soaps.  If your skin becomes reddened/irritated stop using the CHG and inform your nurse when you arrive at Short Stay. Do not shave (including legs and underarms) for at least 48 hours prior to the first CHG shower.  You may shave your face/neck. Please follow these instructions carefully:  1.  Shower with CHG Soap the night before surgery and the  morning of Surgery.  2.  If you choose to wash your hair, wash your hair first as usual with your  normal  shampoo.  3.  After you shampoo, rinse your hair and body thoroughly to remove the  shampoo.                                        4.  Use CHG as you would any other liquid soap.  You can apply chg directly  to the skin and wash , chg soap provided, night before and morning of your surgery.  5.  Apply the CHG Soap to your body ONLY FROM THE NECK DOWN.   Do not use on face/ open                           Wound or open sores. Avoid contact with eyes, ears mouth and genitals (private parts).                       Wash face,  Genitals (private parts) with your normal soap.             6.  Wash thoroughly, paying special attention to the area where your surgery  will be performed.  7.  Thoroughly rinse your body with warm water from the neck down.  8.  DO NOT shower/wash with your normal soap after using and rinsing off  the CHG Soap.             9.  Pat yourself dry with a clean towel.            10.  Wear clean pajamas.            11.  Place clean sheets on  your bed the night of your first shower and do not  sleep with pets. Day of Surgery : Do not apply any lotions/ powders the morning of surgery.  Please wear clean clothes to the hospital/surgery center.  IF YOU HAVE ANY SKIN IRRITATION OR PROBLEMS WITH THE SURGICAL SOAP, PLEASE GET A BAR OF GOLD DIAL SOAP AND SHOWER THE NIGHT BEFORE YOUR SURGERY AND THE MORNING OF YOUR SURGERY. PLEASE LET THE NURSE KNOW MORNING OF YOUR SURGERY IF YOU HAD ANY PROBLEMS WITH THE SURGICAL SOAP.   YOUR SURGEON MAY HAVE REQUESTED EXTENDED RECOVERY TIME AFTER YOUR SURGERY. IT COULD BE A  JUST A FEW HOURS  UP TO AN OVERNIGHT STAY.  YOUR SURGEON SHOULD HAVE DISCUSSED  THIS WITH YOU PRIOR TO YOUR SURGERY. IN THE EVENT YOU NEED TO STAY OVERNIGHT PLEASE REFER TO THE FOLLOWING GUIDELINES. YOU MAY HAVE UP TO 4 VISITORS  MAY VISIT IN THE EXTENDED RECOVERY ROOM UNTIL 800 PM ONLY.  ONE  VISITOR AGE 1 AND OVER MAY SPEND THE NIGHT AND MUST BE IN EXTENDED RECOVERY ROOM NO LATER THAN 800 PM . YOUR DISCHARGE TIME AFTER YOU SPEND THE NIGHT IS 900 AM THE MORNING AFTER YOUR SURGERY. YOU MAY PACK A SMALL OVERNIGHT BAG WITH TOILETRIES FOR YOUR OVERNIGHT STAY IF YOU WISH.  REGARDLESS OF IF YOU STAY OVER NIGHT OR ARE DISCHARGED THE SAME DAY YOU WILL BE REQUIRED TO HAVE A RESPONSIBLE ADULT (18 YRS OLD OR OLDER) STAY WITH YOU FOR AT LEAST THE FIRST 24 HOURS  YOUR PRESCRIPTION MEDICATIONS WILL BE PROVIDED DURING YOUR HOSPITAL STAY.  ________________________________________________________________________                                                        QUESTIONS Mechele Claude PRE OP NURSE PHONE [phone].

## 2023-02-10 NOTE — Progress Notes (Addendum)
Spoke w/ via phone for pre-op interview---Shannon [name] needs dos----none               [name] results------03/01/2023 [name] appt for cbc, cmp, type & screen, 04/03/2022 EKG in Epic & chart, 2019 Echo in Epic  EF 55 - 60%, 2019 Exercise Tolerance Test in Epic- normal COVID test -----patient states asymptomatic no test needed Arrive at -------0745 on Thursday, 03/04/2023 NPO after MN NO Solid Food.  Clear liquids from MN until---0645 Med rec completed Medications to take morning of surgery -----Albuterol inhaler, Stiolto Respimat inhaler, Buspar, Flexeril prn, Omeprazole Diabetic medication -----n/a Patient instructed no nail polish to be worn day of surgery Patient instructed to bring photo id and insurance card day of surgery Patient aware to have Driver (ride ) / caregiver    for 24 hours after surgery - husband, [name] Patient Special Instructions -----Extended / overnight stay instructions given. Bring albuterol inhaler on day of surgery. Pre-Op special Instructions -----none Patient verbalized understanding of instructions that were given at this phone interview. Patient denies shortness of breath, chest pain, fever, cough at this phone interview.  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 She follows with Dr. [name] at [facility], [facility] 11/25/2022. 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 She was given pulmonology clearance with recommendations for anesthesia, including pre and post-op incentive spirometry and the avoidance of the use of pancuronium during anesthesia. I reviewed this case with Dr. Richardson Landry, [facility] on 02/10/23. Per Dr. Richardson Landry, it is okay to proceed with surgery at [facility]. Copy of pulmonary clearance was placed in chart.

## 2023-02-11 ENCOUNTER — Telehealth: Payer: Self-pay | Admitting: Physician Assistant

## 2023-02-11 NOTE — Telephone Encounter (Signed)
Patient is aware of upcoming visit and rescheduled appointment times/dates

## 2023-02-15 ENCOUNTER — Other Ambulatory Visit: Payer: BC Managed Care – PPO

## 2023-02-16 ENCOUNTER — Other Ambulatory Visit: Payer: Self-pay | Admitting: Physician Assistant

## 2023-02-16 DIAGNOSIS — E782 Mixed hyperlipidemia: Secondary | ICD-10-CM

## 2023-02-17 ENCOUNTER — Ambulatory Visit: Payer: BC Managed Care – PPO | Admitting: Physician Assistant

## 2023-02-17 ENCOUNTER — Ambulatory Visit: Payer: BC Managed Care – PPO | Admitting: Internal Medicine

## 2023-02-18 ENCOUNTER — Ambulatory Visit (HOSPITAL_COMMUNITY)
Admission: RE | Admit: 2023-02-18 | Discharge: 2023-02-18 | Disposition: A | Discharge status: Home / Self Care | Payer: BC Managed Care – PPO | Source: Ambulatory Visit | Attending: Internal Medicine | Admitting: Internal Medicine

## 2023-02-18 ENCOUNTER — Other Ambulatory Visit: Payer: Self-pay

## 2023-02-18 ENCOUNTER — Inpatient Hospital Stay: Payer: BC Managed Care – PPO | Attending: Internal Medicine

## 2023-02-18 DIAGNOSIS — I700 Atherosclerosis of aorta: Secondary | ICD-10-CM | POA: Insufficient documentation

## 2023-02-18 DIAGNOSIS — D3A8 Other benign neuroendocrine tumors: Secondary | ICD-10-CM | POA: Diagnosis not present

## 2023-02-18 DIAGNOSIS — J439 Emphysema, unspecified: Secondary | ICD-10-CM | POA: Diagnosis not present

## 2023-02-18 DIAGNOSIS — Z902 Acquired absence of lung [part of]: Secondary | ICD-10-CM | POA: Diagnosis not present

## 2023-02-18 DIAGNOSIS — C7A090 Malignant carcinoid tumor of the bronchus and lung: Secondary | ICD-10-CM | POA: Insufficient documentation

## 2023-02-18 DIAGNOSIS — Z79899 Other long term (current) drug therapy: Secondary | ICD-10-CM | POA: Insufficient documentation

## 2023-02-18 LAB — CMP (CANCER CENTER ONLY)
ALT: 18 U/L (ref 0–44)
AST: 21 U/L (ref 15–41)
Albumin: 4.5 g/dL (ref 3.5–5.0)
Alkaline Phosphatase: 65 U/L (ref 38–126)
Anion gap: 7 (ref 5–15)
BUN: 18 mg/dL (ref 8–23)
CO2: 27 mmol/L (ref 22–32)
Calcium: 10.2 mg/dL (ref 8.9–10.3)
Chloride: 106 mmol/L (ref 98–111)
Creatinine: 0.77 mg/dL (ref 0.44–1.00)
GFR, Estimated: >60 mL/min (ref >60)
Glucose, Bld: 76 mg/dL (ref 70–99)
Potassium: 4.3 mmol/L (ref 3.5–5.1)
Sodium: 140 mmol/L (ref 135–145)
Total Bilirubin: 0.4 mg/dL (ref 0.3–1.2)
Total Protein: 6.7 g/dL (ref 6.5–8.1)

## 2023-02-18 LAB — CBC WITH DIFFERENTIAL (CANCER CENTER ONLY)
Abs Immature Granulocytes: 0.02 10*3/uL (ref 0.00–0.07)
Basophils Absolute: 0 10*3/uL (ref 0.0–0.1)
Basophils Relative: 0 %
Eosinophils Absolute: 0.4 10*3/uL (ref 0.0–0.5)
Eosinophils Relative: 5 %
HCT: 44.3 % (ref 36.0–46.0)
Hemoglobin: 14.6 g/dL (ref 12.0–15.0)
Immature Granulocytes: 0 %
Lymphocytes Relative: 32 %
Lymphs Abs: 2.2 10*3/uL (ref 0.7–4.0)
MCH: 29 pg (ref 26.0–34.0)
MCHC: 33 g/dL (ref 30.0–36.0)
MCV: 87.9 fL (ref 80.0–100.0)
Monocytes Absolute: 0.5 10*3/uL (ref 0.1–1.0)
Monocytes Relative: 8 %
Neutro Abs: 3.7 10*3/uL (ref 1.7–7.7)
Neutrophils Relative %: 55 %
Platelet Count: 231 10*3/uL (ref 150–400)
RBC: 5.04 MIL/uL (ref 3.87–5.11)
RDW: 13.7 % (ref 11.5–15.5)
WBC Count: 6.8 10*3/uL (ref 4.0–10.5)
nRBC: 0 % (ref 0.0–0.2)

## 2023-02-18 MED ORDER — IOHEXOL 300 MG/ML  SOLN
100.0000 mL | Freq: Once | INTRAMUSCULAR | Status: AC | PRN
  Administered 2023-02-18: 100 mL via INTRAVENOUS

## 2023-02-24 ENCOUNTER — Telehealth: Payer: Self-pay

## 2023-02-24 ENCOUNTER — Encounter: Payer: Self-pay | Admitting: Physician Assistant

## 2023-02-24 ENCOUNTER — Ambulatory Visit: Payer: BC Managed Care – PPO | Admitting: Physician Assistant

## 2023-02-24 VITALS — BP 120/76 | HR 71 | Temp 97.2°F | Ht 65.5 in | Wt 181.0 lb

## 2023-02-24 DIAGNOSIS — R0789 Other chest pain: Secondary | ICD-10-CM

## 2023-02-24 DIAGNOSIS — I2584 Coronary atherosclerosis due to calcified coronary lesion: Secondary | ICD-10-CM

## 2023-02-24 DIAGNOSIS — R0609 Other forms of dyspnea: Secondary | ICD-10-CM

## 2023-02-24 DIAGNOSIS — M160 Bilateral primary osteoarthritis of hip: Secondary | ICD-10-CM | POA: Diagnosis not present

## 2023-02-24 DIAGNOSIS — J060 Acute laryngopharyngitis: Secondary | ICD-10-CM

## 2023-02-24 DIAGNOSIS — I2510 Atherosclerotic heart disease of native coronary artery without angina pectoris: Secondary | ICD-10-CM

## 2023-02-24 DIAGNOSIS — K2100 Gastro-esophageal reflux disease with esophagitis, without bleeding: Secondary | ICD-10-CM | POA: Diagnosis not present

## 2023-02-24 MED ORDER — TRAMADOL HCL 50 MG PO TABS
ORAL_TABLET | ORAL | 0 refills | Status: DC
Start: 2023-02-24 — End: 2023-03-01

## 2023-02-24 MED ORDER — AMOXICILLIN 875 MG PO TABS
875.0000 mg | ORAL_TABLET | Freq: Two times a day (BID) | ORAL | 0 refills | Status: AC
Start: 2023-02-24 — End: 2023-03-06

## 2023-02-24 NOTE — Telephone Encounter (Signed)
Patient was made aware of labs from [facility] by provider.

## 2023-02-25 ENCOUNTER — Encounter: Payer: Self-pay | Admitting: Physician Assistant

## 2023-02-25 DIAGNOSIS — G8929 Other chronic pain: Secondary | ICD-10-CM

## 2023-02-25 HISTORY — DX: Other chronic pain: G89.29

## 2023-02-25 NOTE — Progress Notes (Signed)
Acute Office Visit  Subjective:    Patient ID: Shannon [name], female    DOB: Jun 29, 1961, 62 y.o.   MRN: [id number]  Chief Complaint  Patient presents with   Shortness of Breath   Cough    HPI: Patient is in today for complaints of dyspnea.  She states that she feels some tightness with her breathing and feels 'something may be starting up ' like uri.  However she states she has had some intermittent chest pressure and exertional dyspnea for the past few weeks.  She denies fever, cough,- very minimal congestion She did have lung CT screening last week (called for results and told verbally no acute findings for dyspnea but coronary calcification noted)  Pt states that she was told recently by her surgeon that she had an ulcer and now has a consult with GI --- she is taking NSAIDs for her hip pain and arthritis and I recommend to stop NSAIDs and can try tramadol as needed for her pain  Current Outpatient Medications:    acetaminophen (TYLENOL) 325 MG tablet, Take 650 mg by mouth every 6 (six) hours as needed for moderate pain., Disp: , Rfl:    albuterol (VENTOLIN HFA) 108 (90 Base) MCG/ACT inhaler, INHALE 2 PUFFS INTO THE LUNGS EVERY 6 HOURS AS NEEDED FOR WHEEZING OR SHORTNESS OF BREATH, Disp: 6.7 g, Rfl: 1   amoxicillin (AMOXIL) 875 MG tablet, Take 1 tablet (875 mg total) by mouth 2 (two) times daily for 10 days., Disp: 20 tablet, Rfl: 0   busPIRone (BUSPAR) 10 MG tablet, TAKE 1/2 TABLET BY MOUTH TWICE DAILY, Disp: 60 tablet, Rfl: 1   calcium carbonate (TUMS - DOSED IN MG ELEMENTAL CALCIUM) 500 MG chewable tablet, Chew 1 tablet by mouth daily as needed for indigestion or heartburn., Disp: , Rfl:    Cholecalciferol (VITAMIN D-3) 125 MCG (5000 UT) TABS, Take 5,000 Units by mouth daily at 12 noon., Disp: , Rfl:    Cyanocobalamin (B-12) 2500 MCG TABS, Take 2,500 mcg by mouth daily., Disp: , Rfl:    cyclobenzaprine (FLEXERIL) 10 MG tablet, Take 10 mg by mouth 3 (three) times daily as needed  for muscle spasms., Disp: , Rfl:    Evolocumab (REPATHA SURECLICK) 140 MG/ML SOAJ, INJECT 1 ML UNDER THE SKIN ONCE EVERY 2 WEEKS, Disp: 2 mL, Rfl: 1   ferrous sulfate 324 MG TBEC, Take 324 mg by mouth daily with breakfast., Disp: , Rfl:    gabapentin (NEURONTIN) 300 MG capsule, Take 300 mg by mouth at bedtime as needed (pain)., Disp: , Rfl:    lisinopril (ZESTRIL) 20 MG tablet, Take 1 tablet (20 mg total) by mouth daily., Disp: 90 tablet, Rfl: 1   LORazepam (ATIVAN) 1 MG tablet, TAKE 1 TABLET(1 MG) BY MOUTH AT BEDTIME, Disp: 90 tablet, Rfl: 0   meloxicam (MOBIC) 7.5 MG tablet, TAKE 1 TABLET(7.5 MG) BY MOUTH TWICE DAILY AS NEEDED FOR PAIN, Disp: 60 tablet, Rfl: 5   omeprazole (PRILOSEC) 40 MG capsule, Take 1 capsule (40 mg total) by mouth 2 (two) times daily. Take 30-60 minutes before meals, Disp: 60 capsule, Rfl: 5   Tiotropium Bromide-Olodaterol (STIOLTO RESPIMAT) 2.5-2.5 MCG/ACT AERS, Inhale 2 puffs into the lungs daily. (Patient taking differently: Inhale 2 puffs into the lungs daily. Has not started as of 02/10/2023.), Disp: , Rfl:    traMADol (ULTRAM) 50 MG tablet, 1 po every day prn pain, Disp: 30 tablet, Rfl: 0  Allergies  Allergen Reactions   Hydrocodone Nausea Only and  Other (See Comments)    "terrible headache"   Pravastatin Other (See Comments)    Muscle/joint pain   Levaquin [Levofloxacin In D5w] Nausea And Vomiting        Other      Dog Dander     ROS CONSTITUTIONAL: Negative for chills, fatigue, fever,  E/N/T: Negative for ear pain, nasal congestion and sore throat.  CARDIOVASCULAR: see HPI RESPIRATORY: see HPI GASTROINTESTINAL: Negative for abdominal pain, acid reflux symptoms, constipation, diarrhea, nausea and vomiting.  MSK: Negative for arthralgias and myalgias.  INTEGUMENTARY: Negative for rash.  NEUROLOGICAL: Negative for dizziness and headaches.  PSYCHIATRIC: Negative for sleep disturbance and to question depression screen.  Negative for depression, negative for  anhedonia.      Objective:    PHYSICAL EXAM:   BP 120/76 (BP Location: Left Arm, Patient Position: Sitting, Cuff Size: Large)   Pulse 71   Temp (!) 97.2 F (36.2 C) (Temporal)   Ht 5' 5.5" (1.664 m)   Wt 181 lb (82.1 kg)   LMP  (LMP Unknown)   SpO2 98%   BMI 29.66 kg/m    GEN: Well nourished, well developed, in no acute distress  HEENT: normal external ears and nose - normal external auditory canals and TMS - hearing grossly normal -  - Lips, Teeth and Gums - normal  Oropharynx - normal mucosa, palate, and posterior pharynx Cardiac: RRR; no murmurs, rubs, or gallops,no edema -  Respiratory:  normal respiratory rate and pattern with no distress - normal breath sounds with no rales, rhonchi, wheezes or rubs GI: normal bowel sounds, no masses or tenderness Psych: euthymic mood, appropriate affect and demeanor  EKG - no acute changes   Assessment & Plan:    Exertional dyspnea -     EKG 12-Lead -     Troponin I - -     D-dimer, quantitative -     CBC with Differential/Platelet -     Comprehensive metabolic panel -     Ambulatory referral to Cardiology  Chest tightness -     Troponin I - -     D-dimer, quantitative -     CBC with Differential/Platelet -     Comprehensive metabolic panel -     Ambulatory referral to Cardiology  Gastroesophageal reflux disease with esophagitis without hemorrhage Stop NSAIDs Continue prilosec Primary osteoarthritis of both hips -     traMADol HCl; 1 po every day prn pain  Dispense: 30 tablet; Refill: 0  Acute laryngopharyngitis -     Amoxicillin; Take 1 tablet (875 mg total) by mouth 2 (two) times daily for 10 days.  Dispense: 20 tablet; Refill: 0  Coronary artery calcification -     Ambulatory referral to Cardiology     Follow-up: Return in about 4 weeks (around 03/24/2023) for follow-up.  An After Visit Summary was printed and given to the patient.  Jettie Pagan [facility] [phone]

## 2023-02-26 ENCOUNTER — Telehealth: Payer: Self-pay | Admitting: Internal Medicine

## 2023-02-26 NOTE — Telephone Encounter (Signed)
No.   Need to keep EGD as planned. Thanks for checking.

## 2023-02-26 NOTE — Telephone Encounter (Signed)
Spoke with pt and she is aware and knows to keep EGD appt as scheduled.

## 2023-02-26 NOTE — Telephone Encounter (Signed)
Pt had EGD done in the hospital in April and was found to have an ulcer. Repeat EGD scheduled in the [facility] 03/30/23 to assess healing. Pt saw her PCP and asked about her meloxicam yesterday. PCP stated she should have been off of meloxicam and wonders if the EGD needs to be pushed out. She just stopped meloxicam 02/25/23. Please advise.

## 2023-02-26 NOTE — Telephone Encounter (Signed)
Inbound call from patient requesting a call back to discuss upcoming EGD 9/3. Patient states she recently stopped taking meloxicam medication for ulcer. States her PCP had a concern whether the EGD should be pushed out due to an ulcer healing. Requesting a call back to discuss further. Please advise, thank you.

## 2023-02-27 NOTE — Progress Notes (Deleted)
[facility] OFFICE PROGRESS NOTE  [name], PA-C [address] [place] [address]  DIAGNOSIS:  1) stage Ia (T1a, N0, M0) typical carcinoid of the lung 2020 2) History of rectal carcinoid tumor in 2019  PRIOR THERAPY:  1) status post right middle lobectomy with lymph node dissection on December 30, 2018 under the care of Dr. Tyrone Sage.  2) The patient also has a history of rectal carcinoid tumor status post surgical resection in 2019.   CURRENT THERAPY: observation   INTERVAL HISTORY: Shannon [name] 62 y.o. female returns to clinic today for a 1 year follow-up visit.  The patient was last seen by Dr. Arbutus Ped in July 2023.  Patient has a history of a carcinoid tumor of the lung and the rectum.  She is currently on observation and feeling well.  Overall, patient is feeling fairly well.  She is scheduled for a hysterectomy in October 2024.  She is currently also receiving cardiac clearance for ***.  She saw her PCP for exertional dyspnea for which EGD EKG, troponin, D-dimer referred to cardiology Seeing GI for ulcer and taking melcoicam she is scheduled for an EGD on 03/30/2023.  She continues to take Protonix.  She also follows with Dr. Everardo All from pulmonary medicine for being treated with bronchitis in July status post treatment with antibiotics and prednisone  Had lung cancer screening CT    Today the patient denies any fever, chills, night sweats, or unexplained weight loss.  Flushing, diarrhea, or wheezing.  Chest pain?  Dyspnea?  Cough?  Hemoptysis?  She denies any nausea, vomiting, or constipation.  Denies any headache or visual changes.  Denies any rectal bleeding.  Abdominal pain?  She recently had a restaging CT scan performed.  She is here today for evaluation to review her scan results MEDICAL HISTORY: Past Medical History:  Diagnosis Date   Anemia    takes iron supplements, Pt states anemia is thought to be r/t a bleeding uterine polyp.   Anxiety     Follows w/ PCP, Dr. Harless Nakayama.   Arthritis    back, hips, shoulders, hands, knees   Asthma    Follows w/ pulmonology, Dr. [name].   Bronchial carcinoid tumors    s/p right middle lobe lobectomy   Cancer (HCC)    rectal, Lung   Dyspnea    with exertion   Emphysema of lung [facility])    Follows w/ pulmonology, Dr. [name].   Gastritis    Found on 10/2022 EGD. Following w/ Dr. Marina Goodell, GI.   GERD (gastroesophageal reflux disease)    History of kidney stones    passed   HLD (hyperlipidemia)    Follows w/ PCP, Dr. Harless Nakayama.   Hypertension    Follows w/ PCP, Benita Stabile @ [facility].   Pneumonia 02/2018   Rectal tumor    neuroendocrine carcinoma of rectum, follows w/ GI , Dr. Sherre Lain and Dr. Shirline Frees at [facility].   Seasonal allergies    Wears glasses     ALLERGIES:  is allergic to hydrocodone, pravastatin, levaquin [levofloxacin in d5w], and other.  MEDICATIONS:  Current Outpatient Medications  Medication Sig Dispense Refill   acetaminophen (TYLENOL) 325 MG tablet Take 650 mg by mouth every 6 (six) hours as needed for moderate pain.     albuterol (VENTOLIN HFA) 108 (90 Base) MCG/ACT inhaler INHALE 2 PUFFS INTO THE LUNGS EVERY 6 HOURS AS NEEDED FOR WHEEZING OR SHORTNESS  OF BREATH 6.7 g 1   amoxicillin (AMOXIL) 875 MG tablet Take 1 tablet (875 mg total) by mouth 2 (two) times daily for 10 days. 20 tablet 0   busPIRone (BUSPAR) 10 MG tablet TAKE 1/2 TABLET BY MOUTH TWICE DAILY 60 tablet 1   calcium carbonate (TUMS - DOSED IN MG ELEMENTAL CALCIUM) 500 MG chewable tablet Chew 1 tablet by mouth daily as needed for indigestion or heartburn.     Cholecalciferol (VITAMIN D-3) 125 MCG (5000 UT) TABS Take 5,000 Units by mouth daily at 12 noon.     Cyanocobalamin (B-12) 2500 MCG TABS Take 2,500 mcg by mouth daily.     cyclobenzaprine (FLEXERIL) 10 MG tablet Take 10 mg by mouth 3 (three) times daily as needed for muscle spasms.     Evolocumab  (REPATHA SURECLICK) 140 MG/ML SOAJ INJECT 1 ML UNDER THE SKIN ONCE EVERY 2 WEEKS 2 mL 1   ferrous sulfate 324 MG TBEC Take 324 mg by mouth daily with breakfast.     gabapentin (NEURONTIN) 300 MG capsule Take 300 mg by mouth at bedtime as needed (pain).     lisinopril (ZESTRIL) 20 MG tablet Take 1 tablet (20 mg total) by mouth daily. 90 tablet 1   LORazepam (ATIVAN) 1 MG tablet TAKE 1 TABLET(1 MG) BY MOUTH AT BEDTIME 90 tablet 0   meloxicam (MOBIC) 7.5 MG tablet TAKE 1 TABLET(7.5 MG) BY MOUTH TWICE DAILY AS NEEDED FOR PAIN 60 tablet 5   omeprazole (PRILOSEC) 40 MG capsule Take 1 capsule (40 mg total) by mouth 2 (two) times daily. Take 30-60 minutes before meals 60 capsule 5   Tiotropium Bromide-Olodaterol (STIOLTO RESPIMAT) 2.5-2.5 MCG/ACT AERS Inhale 2 puffs into the lungs daily. (Patient taking differently: Inhale 2 puffs into the lungs daily. Has not started as of 02/10/2023.)     traMADol (ULTRAM) 50 MG tablet 1 po every day prn pain 30 tablet 0   No current facility-administered medications for this visit.    SURGICAL HISTORY:  Past Surgical History:  Procedure Laterality Date   ABLATION     endometrial   BIOPSY  10/10/2018   Procedure: BIOPSY;  Surgeon: Meridee Score Netty Starring., MD;  Location: [facility] ENDOSCOPY;  Service: Gastroenterology;;   BIOPSY  06/12/2019   Procedure: BIOPSY;  Surgeon: Lemar Lofty., MD;  Location: [facility] ENDOSCOPY;  Service: Gastroenterology;;   BIOPSY  09/30/2020   Procedure: BIOPSY;  Surgeon: Lemar Lofty., MD;  Location: [facility] ENDOSCOPY;  Service: Gastroenterology;;   BIOPSY  11/18/2022   Procedure: BIOPSY;  Surgeon: Lemar Lofty., MD;  Location: [facility] ENDOSCOPY;  Service: Gastroenterology;;   COLONOSCOPY     COLONOSCOPY WITH PROPOFOL N/A 11/18/2022   Procedure: COLONOSCOPY WITH PROPOFOL;  Surgeon: Lemar Lofty., MD;  Location: [facility] ENDOSCOPY;  Service: Gastroenterology;  Laterality: N/A;   DOPPLER ECHOCARDIOGRAPHY     ENDOSCOPIC  MUCOSAL RESECTION N/A 10/10/2018   Procedure: ENDOSCOPIC MUCOSAL RESECTION;  Surgeon: Meridee Score, Netty Starring., MD;  Location: [facility] ENDOSCOPY;  Service: Gastroenterology;  Laterality: N/A;   ESOPHAGOGASTRODUODENOSCOPY N/A 11/18/2022   Procedure: ESOPHAGOGASTRODUODENOSCOPY (EGD);  Surgeon: Lemar Lofty., MD;  Location: [facility] ENDOSCOPY;  Service: Gastroenterology;  Laterality: N/A;   EUS N/A 07/18/2018   Procedure: LOWER ENDOSCOPIC ULTRASOUND (EUS);  Surgeon: Lemar Lofty., MD;  Location: [facility] ENDOSCOPY;  Service: Gastroenterology;  Laterality: N/A;   EUS N/A 10/10/2018   Procedure: LOWER ENDOSCOPIC ULTRASOUND (EUS);  Surgeon: Lemar Lofty., MD;  Location: [facility] ENDOSCOPY;  Service: Gastroenterology;  Laterality: N/A;  EUS N/A 06/12/2019   Procedure: LOWER ENDOSCOPIC ULTRASOUND (EUS);  Surgeon: Lemar Lofty., MD;  Location: [facility] ENDOSCOPY;  Service: Gastroenterology;  Laterality: N/A;   EUS N/A 09/30/2020   Procedure: LOWER ENDOSCOPIC ULTRASOUND (EUS);  Surgeon: Lemar Lofty., MD;  Location: [facility] ENDOSCOPY;  Service: Gastroenterology;  Laterality: N/A;   EUS N/A 11/18/2022   Procedure: LOWER ENDOSCOPIC ULTRASOUND (EUS);  Surgeon: Lemar Lofty., MD;  Location: [facility] ENDOSCOPY;  Service: Gastroenterology;  Laterality: N/A;   FLEXIBLE SIGMOIDOSCOPY N/A 07/18/2018   Procedure: FLEXIBLE SIGMOIDOSCOPY;  Surgeon: Meridee Score Netty Starring., MD;  Location: [facility] ENDOSCOPY;  Service: Gastroenterology;  Laterality: N/A;   FLEXIBLE SIGMOIDOSCOPY N/A 10/10/2018   Procedure: FLEXIBLE SIGMOIDOSCOPY;  Surgeon: Meridee Score Netty Starring., MD;  Location: [facility] ENDOSCOPY;  Service: Gastroenterology;  Laterality: N/A;   FLEXIBLE SIGMOIDOSCOPY N/A 06/12/2019   Procedure: FLEXIBLE SIGMOIDOSCOPY;  Surgeon: Meridee Score Netty Starring., MD;  Location: [facility] ENDOSCOPY;  Service: Gastroenterology;  Laterality: N/A;   FLEXIBLE SIGMOIDOSCOPY N/A 09/30/2020   Procedure: FLEXIBLE SIGMOIDOSCOPY;  Surgeon:  Meridee Score Netty Starring., MD;  Location: [facility] ENDOSCOPY;  Service: Gastroenterology;  Laterality: N/A;   HEMOSTASIS CLIP PLACEMENT  10/10/2018   Procedure: HEMOSTASIS CLIP PLACEMENT;  Surgeon: Lemar Lofty., MD;  Location: [facility] ENDOSCOPY;  Service: Gastroenterology;;   LUNG CANCER SURGERY  12/30/2018   right lun- mid lobe , dr gerhardt   POLYPECTOMY  11/18/2022   Procedure: POLYPECTOMY;  Surgeon: Lemar Lofty., MD;  Location: [facility] ENDOSCOPY;  Service: Gastroenterology;;   Gaspar Bidding DILATION N/A 11/18/2022   Procedure: Jacklyn Shell;  Surgeon: Lemar Lofty., MD;  Location: [facility] ENDOSCOPY;  Service: Gastroenterology;  Laterality: N/A;   SUBMUCOSAL LIFTING INJECTION  10/10/2018   Procedure: SUBMUCOSAL LIFTING INJECTION;  Surgeon: Meridee Score Netty Starring., MD;  Location: [facility] ENDOSCOPY;  Service: Gastroenterology;;   TEE WITHOUT CARDIOVERSION  02/2018   TONSILLECTOMY     TOTAL HIP ARTHROPLASTY Left 11/2021   TUBAL LIGATION  1987   VIDEO ASSISTED THORACOSCOPY (VATS)/WEDGE RESECTION Right 12/30/2018   Procedure: VIDEO ASSISTED THORACOSCOPY (VATS), MINI THORACOTOMY, RIGHT MIDDLE LOBECTOMY WITH LYMPH NODE DISSECTION;  Surgeon: Delight Ovens, MD;  Location: [facility] OR;  Service: Thoracic;  Laterality: Right;   VIDEO BRONCHOSCOPY N/A 10/12/2018   Procedure: VIDEO BRONCHOSCOPY;  Surgeon: Delight Ovens, MD;  Location[facility] OR;  Service: Thoracic;  Laterality: N/A;   VIDEO BRONCHOSCOPY N/A 12/30/2018   Procedure: VIDEO BRONCHOSCOPY;  Surgeon: Delight Ovens, MD;  Location: [facility] OR;  Service: Thoracic;  Laterality: N/A;   WISDOM TOOTH EXTRACTION      REVIEW OF SYSTEMS:   Review of Systems  Constitutional: Negative for appetite change, chills, fatigue, fever and unexpected weight change.  HENT:   Negative for mouth sores, nosebleeds, sore throat and trouble swallowing.   Eyes: Negative for eye problems and icterus.  Respiratory: Negative for cough, hemoptysis, shortness of breath  and wheezing.   Cardiovascular: Negative for chest pain and leg swelling.  Gastrointestinal: Negative for abdominal pain, constipation, diarrhea, nausea and vomiting.  Genitourinary: Negative for bladder incontinence, difficulty urinating, dysuria, frequency and hematuria.   Musculoskeletal: Negative for back pain, gait problem, neck pain and neck stiffness.  Skin: Negative for itching and rash.  Neurological: Negative for dizziness, extremity weakness, gait problem, headaches, light-headedness and seizures.  Hematological: Negative for adenopathy. Does not bruise/bleed easily.  Psychiatric/Behavioral: Negative for confusion, depression and sleep disturbance. The patient is not nervous/anxious.     PHYSICAL EXAMINATION:  There were no vitals taken for this visit.  ECOG PERFORMANCE STATUS: {CHL ONC ECOG [id number]  Physical Exam  Constitutional: Oriented to person, place, and time and well-developed, well-nourished, and in no distress. No distress.  HENT:  Head: Normocephalic and atraumatic.  Mouth/Throat: Oropharynx is clear and moist. No oropharyngeal exudate.  Eyes: Conjunctivae are normal. Right eye exhibits no discharge. Left eye exhibits no discharge. No scleral icterus.  Neck: Normal range of motion. Neck supple.  Cardiovascular: Normal rate, regular rhythm, normal heart sounds and intact distal pulses.   Pulmonary/Chest: Effort normal and breath sounds normal. No respiratory distress. No wheezes. No rales.  Abdominal: Soft. Bowel sounds are normal. Exhibits no distension and no mass. There is no tenderness.  Musculoskeletal: Normal range of motion. Exhibits no edema.  Lymphadenopathy:    No cervical adenopathy.  Neurological: Alert and oriented to person, place, and time. Exhibits normal muscle tone. Gait normal. Coordination normal.  Skin: Skin is warm and dry. No rash noted. Not diaphoretic. No erythema. No pallor.  Psychiatric: Mood, memory and judgment normal.  Vitals  reviewed.  LABORATORY DATA: Lab Results  Component Value Date   WBC 6.8 02/18/2023   HGB 14.6 02/18/2023   HCT 44.3 02/18/2023   MCV 87.9 02/18/2023   PLT 231 02/18/2023      Chemistry      Component Value Date/Time   NA 140 02/18/2023 1610   NA 142 11/16/2022 0951   K 4.3 02/18/2023 1610   CL 106 02/18/2023 1610   CO2 27 02/18/2023 1610   BUN 18 02/18/2023 1610   BUN 14 11/16/2022 0951   CREATININE 0.77 02/18/2023 1610      Component Value Date/Time   CALCIUM 10.2 02/18/2023 1610   ALKPHOS 65 02/18/2023 1610   AST 21 02/18/2023 1610   ALT 18 02/18/2023 1610   BILITOT 0.4 02/18/2023 1610       RADIOGRAPHIC STUDIES:  CT Chest W Contrast  Result Date: 02/24/2023 CLINICAL DATA:  Pulmonary neuroendocrine tumor; * Tracking Code: BO * EXAM: CT CHEST, ABDOMEN, AND PELVIS WITH CONTRAST TECHNIQUE: Multidetector CT imaging of the chest, abdomen and pelvis was performed following the standard protocol during bolus administration of intravenous contrast. RADIATION DOSE REDUCTION: This exam was performed according to the departmental dose-optimization program which includes automated exposure control, adjustment of the mA and/or kV according to patient size and/or use of iterative reconstruction technique. CONTRAST:  OMNIPAQUE IOHEXOL 300 MG/ML  SOLN COMPARISON:  Chest CT dated February 06, 2022; PET-CT dated November 21, 2020 FINDINGS: CT CHEST FINDINGS Cardiovascular: Normal heart size. No pericardial effusion. Normal caliber thoracic aorta with mild atherosclerotic disease. Mild coronary artery calcifications. Mediastinum/Nodes: Small hiatal hernia. Thyroid is unremarkable. No enlarged lymph nodes seen in the chest. Lungs/Pleura: Stable postsurgical findings of prior right middle lobectomy. Remaining central airways are patent. Mild centrilobular emphysema. Two new adjacent solid nodules of the left upper lobe measuring 4 and 5 mm on series 501, image 80. No pleural effusion.  Musculoskeletal: No aggressive appearing osseous lesions. CT ABDOMEN PELVIS FINDINGS Hepatobiliary: Tiny low-attenuation 5 mm lesion of the left lobe of the liver seen on series 503 image 396, present on prior exams and likely a simple cyst. No suspicious liver lesion. Gallbladder is unremarkable. No biliary ductal dilation. Pancreas: Unremarkable. No pancreatic ductal dilatation or surrounding inflammatory changes. Spleen: Normal in size without focal abnormality. Adrenals/Urinary Tract: Bilateral adrenal glands are unremarkable. No hydronephrosis. Nonobstructing stones of the lower pole of the left kidney. Bladder is unremarkable. Stomach/Bowel: Stomach is within normal limits. Appendix  appears normal. Diverticulosis. No evidence of bowel wall thickening, distention, or inflammatory changes. Vascular/Lymphatic: Aortic atherosclerosis. No enlarged abdominal or pelvic lymph nodes. Reproductive: Enlarged multi fibroid uterus, similar to prior PET-CT. Other: Small bilateral fat containing inguinal hernias. Musculoskeletal: Prior left total hip replacement. No aggressive appearing osseous lesions. IMPRESSION: 1. Stable postsurgical findings prior right middle lobectomy. No evidence of local recurrence or metastatic disease in the chest. 2. Two new nonspecific small solid nodules of the left upper lobe measuring 4 and 5 mm. Recommend follow-up chest CT in 3-6 months for further evaluation. 3. No evidence of metastatic disease in the abdomen or pelvis. 4. Aortic Atherosclerosis (ICD10-I70.0) and Emphysema (ICD10-J43.9). Electronically Signed   By: Allegra Lai M.D.   On: 02/24/2023 11:08   CT Abdomen Pelvis W Contrast  Result Date: 02/24/2023 CLINICAL DATA:  Pulmonary neuroendocrine tumor; * Tracking Code: BO * EXAM: CT CHEST, ABDOMEN, AND PELVIS WITH CONTRAST TECHNIQUE: Multidetector CT imaging of the chest, abdomen and pelvis was performed following the standard protocol during bolus administration of  intravenous contrast. RADIATION DOSE REDUCTION: This exam was performed according to the departmental dose-optimization program which includes automated exposure control, adjustment of the mA and/or kV according to patient size and/or use of iterative reconstruction technique. CONTRAST:  OMNIPAQUE IOHEXOL 300 MG/ML  SOLN COMPARISON:  Chest CT dated February 06, 2022; PET-CT dated November 21, 2020 FINDINGS: CT CHEST FINDINGS Cardiovascular: Normal heart size. No pericardial effusion. Normal caliber thoracic aorta with mild atherosclerotic disease. Mild coronary artery calcifications. Mediastinum/Nodes: Small hiatal hernia. Thyroid is unremarkable. No enlarged lymph nodes seen in the chest. Lungs/Pleura: Stable postsurgical findings of prior right middle lobectomy. Remaining central airways are patent. Mild centrilobular emphysema. Two new adjacent solid nodules of the left upper lobe measuring 4 and 5 mm on series 501, image 80. No pleural effusion. Musculoskeletal: No aggressive appearing osseous lesions. CT ABDOMEN PELVIS FINDINGS Hepatobiliary: Tiny low-attenuation 5 mm lesion of the left lobe of the liver seen on series 503 image 396, present on prior exams and likely a simple cyst. No suspicious liver lesion. Gallbladder is unremarkable. No biliary ductal dilation. Pancreas: Unremarkable. No pancreatic ductal dilatation or surrounding inflammatory changes. Spleen: Normal in size without focal abnormality. Adrenals/Urinary Tract: Bilateral adrenal glands are unremarkable. No hydronephrosis. Nonobstructing stones of the lower pole of the left kidney. Bladder is unremarkable. Stomach/Bowel: Stomach is within normal limits. Appendix appears normal. Diverticulosis. No evidence of bowel wall thickening, distention, or inflammatory changes. Vascular/Lymphatic: Aortic atherosclerosis. No enlarged abdominal or pelvic lymph nodes. Reproductive: Enlarged multi fibroid uterus, similar to prior PET-CT. Other: Small bilateral  fat containing inguinal hernias. Musculoskeletal: Prior left total hip replacement. No aggressive appearing osseous lesions. IMPRESSION: 1. Stable postsurgical findings prior right middle lobectomy. No evidence of local recurrence or metastatic disease in the chest. 2. Two new nonspecific small solid nodules of the left upper lobe measuring 4 and 5 mm. Recommend follow-up chest CT in 3-6 months for further evaluation. 3. No evidence of metastatic disease in the abdomen or pelvis. 4. Aortic Atherosclerosis (ICD10-I70.0) and Emphysema (ICD10-J43.9). Electronically Signed   By: Allegra Lai M.D.   On: 02/24/2023 11:08     ASSESSMENT/PLAN:  This is a very pleasant 62 year old Caucasian female with history of stage Ia (T1a, N0, M0) typical carcinoid of the lung status post right middle lobectomy with lymph node dissection on December 30, 2018 under the care of Dr. Tyrone Sage. The patient also has a history of rectal carcinoid tumor status  post surgical resection in 2019.   The patient is currently on observation and is seen on a annual basis.  The patient recently had a restaging CT scan.  The patient was seen with Dr. Arbutus Ped today.  Dr. Arbutus Ped personally and independently reviewed the scan discussed results with the patient today.  The scan showed ***Dr. Arbutus Ped recommends that she continue on observation with a repeat **in 1 year*  We will see her back for follow-up visit at that time to review her scan results.  The patient was advised to call immediately if she has any concerning symptoms in the interval. The patient voices understanding of current disease status and treatment options and is in agreement with the current care plan. All questions were answered. The patient knows to call the clinic with any problems, questions or concerns. We can certainly see the patient much sooner if necessary    No orders of the defined types were placed in this encounter.    I spent {CHL ONC TIME VISIT -  ZOXWR:[id number]} counseling the patient face to face. The total time spent in the appointment was {CHL ONC TIME VISIT - BJYNW:[id number]}.  Gonsalo Cuthbertson L Lainey Nelson, PA-C 02/27/23

## 2023-03-01 ENCOUNTER — Encounter (HOSPITAL_COMMUNITY): Payer: Self-pay

## 2023-03-01 ENCOUNTER — Other Ambulatory Visit: Payer: Self-pay

## 2023-03-01 ENCOUNTER — Encounter (HOSPITAL_COMMUNITY): Payer: BC Managed Care – PPO

## 2023-03-01 ENCOUNTER — Telehealth: Payer: Self-pay

## 2023-03-01 ENCOUNTER — Telehealth: Payer: Self-pay | Admitting: *Deleted

## 2023-03-01 ENCOUNTER — Telehealth: Payer: Self-pay | Admitting: Cardiovascular Disease

## 2023-03-01 DIAGNOSIS — M160 Bilateral primary osteoarthritis of hip: Secondary | ICD-10-CM

## 2023-03-01 MED ORDER — TRAMADOL HCL 50 MG PO TABS
50.0000 mg | ORAL_TABLET | Freq: Two times a day (BID) | ORAL | 0 refills | Status: DC | PRN
Start: 2023-03-01 — End: 2023-03-10

## 2023-03-01 NOTE — Telephone Encounter (Signed)
   Name: Shannon [name]  DOB: 11/23/1960  MRN: [id number]  Primary Cardiologist: None  Chart reviewed as part of pre-operative protocol coverage. Because of Shannon [name]'s past medical history and time since last visit, she will require a follow-up in-office visit in order to better assess preoperative cardiovascular risk.  Patient has an office visit scheduled with Dr. Izora Ribas on 03/11/2023. Appointment notes have been updated to reflect the need for pre-op evaluation.    Pre-op covering staff:  - Please contact requesting surgeon's office via preferred method (i.e, phone, fax) to inform them of need for appointment prior to surgery.   Carlos Levering, NP  03/01/2023, 4:26 PM

## 2023-03-01 NOTE — Telephone Encounter (Signed)
She was prescribed once daily #30 ---- even if she had increased to 2 daily that should have lasted 15 days? Did the pharmacy give her a different amount?

## 2023-03-01 NOTE — Telephone Encounter (Signed)
Patient called and stated that she was told it was okay to take two tramadol as need for pain, she has been taking 2 because 1 is not helping at all. Due to her taking 2 she will need a new RX. Please advise

## 2023-03-01 NOTE — Progress Notes (Signed)
Patient went to see her PCP on 02/24/23 for possible URI, chest pressure and dyspnea. See OV note from [facility] in Epic dated 02/24/23. Patient was prescribed amoxicillin and referred to cardiology. I reviewed this case with Dr. Bradley Ferris, MDA. Per Dr. Bradley Ferris, patient needs to f/u with PCP as directed in 4 weeks after 02/24/23 OV and needs to see cardiology for clearance before surgery can be rescheduled. With f/u and clearance, surgery should not be scheduled until at least September 2024. I called Dahlia Client, surgery scheduler for Dr. Jackelyn Knife and let her know the above information.

## 2023-03-01 NOTE — Telephone Encounter (Signed)
PT REQUIRES NEW PT APPOINTMENT, SURGEON'S OFFICE IS ALREADY AWARE.  REFERRAL ALREADY PLACED.      Pre-operative Risk Assessment    Patient Name: Shannon [name]  DOB: 12-21-1960 MRN: [id number]      Request for Surgical Clearance    Procedure:   TLH/BSO, A&P REPAIR, & CYSTO  Date of Surgery:  Clearance TBD                                 Surgeon:  Lavina Hamman, MD Surgeon's Group or Practice Name:  [facility] Phone number:  [phone] Fax number:  [phone]   Type of Clearance Requested:   - Medical    Type of Anesthesia:  Not Indicated   Additional requests/questions:    Wilhemina Cash   03/01/2023, 3:35 PM

## 2023-03-01 NOTE — Telephone Encounter (Signed)
error 

## 2023-03-01 NOTE — Telephone Encounter (Signed)
Patient stayed she has some left she just got them filled last week, she was just calling to make provider aware, that she went up to 2 pills a day so she will run out earlier.  Patient made aware, to call office back when she gets 1-2 days close to needing a refill, so refill can be sent in for the change.

## 2023-03-02 ENCOUNTER — Ambulatory Visit: Payer: BC Managed Care – PPO | Admitting: Physician Assistant

## 2023-03-02 NOTE — Telephone Encounter (Signed)
I will forward preop clearance to Dr. Rolly Salter.

## 2023-03-03 ENCOUNTER — Inpatient Hospital Stay: Payer: BC Managed Care – PPO | Admitting: Physician Assistant

## 2023-03-03 ENCOUNTER — Inpatient Hospital Stay: Payer: BC Managed Care – PPO | Attending: Physician Assistant | Admitting: Physician Assistant

## 2023-03-03 VITALS — BP 114/56 | HR 71 | Temp 98.1°F | Resp 13 | Wt 181.8 lb

## 2023-03-03 DIAGNOSIS — C7A026 Malignant carcinoid tumor of the rectum: Secondary | ICD-10-CM

## 2023-03-03 DIAGNOSIS — Z85110 Personal history of malignant carcinoid tumor of bronchus and lung: Secondary | ICD-10-CM | POA: Insufficient documentation

## 2023-03-03 DIAGNOSIS — C7A090 Malignant carcinoid tumor of the bronchus and lung: Secondary | ICD-10-CM | POA: Diagnosis not present

## 2023-03-03 DIAGNOSIS — Z85040 Personal history of malignant carcinoid tumor of rectum: Secondary | ICD-10-CM | POA: Insufficient documentation

## 2023-03-03 NOTE — Progress Notes (Signed)
[name] OFFICE PROGRESS NOTE  Shannon Sofia, PA-C [address] [place] [address]  DIAGNOSIS:  1) stage Ia (T1a, N0, M0) typical carcinoid of the lung 2020 2) History of rectal carcinoid tumor in 2019  PRIOR THERAPY:  1) status post right middle lobectomy with lymph node dissection on December 30, 2018 under the care of Dr. Tyrone Sage.  2) The patient also has a history of rectal carcinoid tumor status post surgical resection in 2019.   CURRENT THERAPY: observation   INTERVAL HISTORY: Shannon [name] 62 y.o. female returns to the clinic today for a 1 year follow-up visit.  The patient was last seen by Dr. Arbutus Ped in July 2023. The patient has a history of a carcinoid tumor of the lung and the rectum.  She is currently on observation and feeling well.  She is scheduled for a few procedures. She is scheduled for a hysterectomy in October 2024 to have fibroids and a bleeding polyp. She is currently also receiving cardiac clearance for atherosclerosis that was noted on her CT. She is seeing GI for ulcer thought to be due to taking meloxicam. She is scheduled for an EGD on 03/30/2023.  She continues to take Protonix.  She also follows with Dr. Everardo All from pulmonary medicine and was treated for mild bronchitis in July. She is status post treatment with antibiotics and prednisone.    Today the patient denies any fever, chills, night sweats, or unexplained weight loss.  She reports she occasionally may have flushing but not very often.  Denies any unusual diarrhea or wheezing.  She may have some shortness of breath if outside in the heat/immunity.  Denies any chest pain or hemoptysis. She denies any nausea, vomiting, or constipation.  Denies any headache or visual changes.  Denies any rectal bleeding.  Abdominal discomfort from her ulcer has improved since she stopped taking meloxicam.  She recently had a restaging CT scan performed.  She is here today for evaluation to review her  scan results.    MEDICAL HISTORY: Past Medical History:  Diagnosis Date   Anemia    takes iron supplements, Pt states anemia is thought to be r/t a bleeding uterine polyp.   Anxiety    Follows w/ PCP, Dr. Harless Nakayama.   Arthritis    back, hips, shoulders, hands, knees   Asthma    Follows w/ pulmonology, Dr. [name].   Bronchial carcinoid tumors    s/p right middle lobe lobectomy   Cancer (HCC)    rectal, Lung   Dyspnea    with exertion   Emphysema of lung [name])    Follows w/ pulmonology, Dr. [name].   Gastritis    Found on 10/2022 EGD. Following w/ Dr. Marina Goodell, GI.   GERD (gastroesophageal reflux disease)    History of kidney stones    passed   HLD (hyperlipidemia)    Follows w/ PCP, Dr. Harless Nakayama.   Hypertension    Follows w/ PCP, Benita Stabile @ [facility].   Pneumonia 02/2018   Rectal tumor    neuroendocrine carcinoma of rectum, follows w/ GI , Dr. Sherre Lain and Dr. Shirline Frees at [name].   Seasonal allergies    Wears glasses     ALLERGIES:  is allergic to hydrocodone, pravastatin, levaquin [levofloxacin in d5w], and other.  MEDICATIONS:  Current Outpatient Medications  Medication Sig Dispense Refill   acetaminophen (TYLENOL) 325 MG tablet Take 650 mg by mouth  every 6 (six) hours as needed for moderate pain.     albuterol (VENTOLIN HFA) 108 (90 Base) MCG/ACT inhaler INHALE 2 PUFFS INTO THE LUNGS EVERY 6 HOURS AS NEEDED FOR WHEEZING OR SHORTNESS OF BREATH 6.7 g 1   amoxicillin (AMOXIL) 875 MG tablet Take 1 tablet (875 mg total) by mouth 2 (two) times daily for 10 days. 20 tablet 0   busPIRone (BUSPAR) 10 MG tablet TAKE 1/2 TABLET BY MOUTH TWICE DAILY 60 tablet 1   calcium carbonate (TUMS - DOSED IN MG ELEMENTAL CALCIUM) 500 MG chewable tablet Chew 1 tablet by mouth daily as needed for indigestion or heartburn.     Cholecalciferol (VITAMIN D-3) 125 MCG (5000 UT) TABS Take 5,000 Units by mouth daily at 12 noon.      Cyanocobalamin (B-12) 2500 MCG TABS Take 2,500 mcg by mouth daily.     cyclobenzaprine (FLEXERIL) 10 MG tablet Take 10 mg by mouth 3 (three) times daily as needed for muscle spasms.     Evolocumab (REPATHA SURECLICK) 140 MG/ML SOAJ INJECT 1 ML UNDER THE SKIN ONCE EVERY 2 WEEKS 2 mL 1   ferrous sulfate 324 MG TBEC Take 324 mg by mouth daily with breakfast.     gabapentin (NEURONTIN) 300 MG capsule Take 300 mg by mouth at bedtime as needed (pain).     lisinopril (ZESTRIL) 20 MG tablet Take 1 tablet (20 mg total) by mouth daily. 90 tablet 1   LORazepam (ATIVAN) 1 MG tablet TAKE 1 TABLET(1 MG) BY MOUTH AT BEDTIME 90 tablet 0   meloxicam (MOBIC) 7.5 MG tablet TAKE 1 TABLET(7.5 MG) BY MOUTH TWICE DAILY AS NEEDED FOR PAIN 60 tablet 5   omeprazole (PRILOSEC) 40 MG capsule Take 1 capsule (40 mg total) by mouth 2 (two) times daily. Take 30-60 minutes before meals 60 capsule 5   Tiotropium Bromide-Olodaterol (STIOLTO RESPIMAT) 2.5-2.5 MCG/ACT AERS Inhale 2 puffs into the lungs daily. (Patient taking differently: Inhale 2 puffs into the lungs daily. Has not started as of 02/10/2023.)     traMADol (ULTRAM) 50 MG tablet Take 1 tablet (50 mg total) by mouth every 12 (twelve) hours as needed. 1 po twice a day prn for pain 60 tablet 0   No current facility-administered medications for this visit.    SURGICAL HISTORY:  Past Surgical History:  Procedure Laterality Date   ABLATION     endometrial   BIOPSY  10/10/2018   Procedure: BIOPSY;  Surgeon: Meridee Score Netty Starring., MD;  Location: [name] ENDOSCOPY;  Service: Gastroenterology;;   BIOPSY  06/12/2019   Procedure: BIOPSY;  Surgeon: Lemar Lofty., MD;  Location: [name] ENDOSCOPY;  Service: Gastroenterology;;   BIOPSY  09/30/2020   Procedure: BIOPSY;  Surgeon: Lemar Lofty., MD;  Location: [facility] ENDOSCOPY;  Service: Gastroenterology;;   BIOPSY  11/18/2022   Procedure: BIOPSY;  Surgeon: Lemar Lofty., MD;  Location: [facility] ENDOSCOPY;  Service:  Gastroenterology;;   COLONOSCOPY     COLONOSCOPY WITH PROPOFOL N/A 11/18/2022   Procedure: COLONOSCOPY WITH PROPOFOL;  Surgeon: Lemar Lofty., MD;  Location: [facility] ENDOSCOPY;  Service: Gastroenterology;  Laterality: N/A;   DOPPLER ECHOCARDIOGRAPHY     ENDOSCOPIC MUCOSAL RESECTION N/A 10/10/2018   Procedure: ENDOSCOPIC MUCOSAL RESECTION;  Surgeon: Meridee Score, Netty Starring., MD;  Location: [facility] ENDOSCOPY;  Service: Gastroenterology;  Laterality: N/A;   ESOPHAGOGASTRODUODENOSCOPY N/A 11/18/2022   Procedure: ESOPHAGOGASTRODUODENOSCOPY (EGD);  Surgeon: Lemar Lofty., MD;  Location: [facility] ENDOSCOPY;  Service: Gastroenterology;  Laterality: N/A;   EUS N/A 07/18/2018  Procedure: LOWER ENDOSCOPIC ULTRASOUND (EUS);  Surgeon: Lemar Lofty., MD;  Location: [facility] ENDOSCOPY;  Service: Gastroenterology;  Laterality: N/A;   EUS N/A 10/10/2018   Procedure: LOWER ENDOSCOPIC ULTRASOUND (EUS);  Surgeon: Lemar Lofty., MD;  Location: [name] ENDOSCOPY;  Service: Gastroenterology;  Laterality: N/A;   EUS N/A 06/12/2019   Procedure: LOWER ENDOSCOPIC ULTRASOUND (EUS);  Surgeon: Lemar Lofty., MD;  Location: [facility] ENDOSCOPY;  Service: Gastroenterology;  Laterality: N/A;   EUS N/A 09/30/2020   Procedure: LOWER ENDOSCOPIC ULTRASOUND (EUS);  Surgeon: Lemar Lofty., MD;  Location: [facility] ENDOSCOPY;  Service: Gastroenterology;  Laterality: N/A;   EUS N/A 11/18/2022   Procedure: LOWER ENDOSCOPIC ULTRASOUND (EUS);  Surgeon: Lemar Lofty., MD;  Location: [facility] ENDOSCOPY;  Service: Gastroenterology;  Laterality: N/A;   FLEXIBLE SIGMOIDOSCOPY N/A 07/18/2018   Procedure: FLEXIBLE SIGMOIDOSCOPY;  Surgeon: Meridee Score Netty Starring., MD;  Location: [name] ENDOSCOPY;  Service: Gastroenterology;  Laterality: N/A;   FLEXIBLE SIGMOIDOSCOPY N/A 10/10/2018   Procedure: FLEXIBLE SIGMOIDOSCOPY;  Surgeon: Meridee Score Netty Starring., MD;  Location: [facility] ENDOSCOPY;  Service: Gastroenterology;  Laterality: N/A;    FLEXIBLE SIGMOIDOSCOPY N/A 06/12/2019   Procedure: FLEXIBLE SIGMOIDOSCOPY;  Surgeon: Meridee Score Netty Starring., MD;  Location: [facility] ENDOSCOPY;  Service: Gastroenterology;  Laterality: N/A;   FLEXIBLE SIGMOIDOSCOPY N/A 09/30/2020   Procedure: FLEXIBLE SIGMOIDOSCOPY;  Surgeon: Meridee Score Netty Starring., MD;  Location: [facility] ENDOSCOPY;  Service: Gastroenterology;  Laterality: N/A;   HEMOSTASIS CLIP PLACEMENT  10/10/2018   Procedure: HEMOSTASIS CLIP PLACEMENT;  Surgeon: Lemar Lofty., MD;  Location: [facility] ENDOSCOPY;  Service: Gastroenterology;;   LUNG CANCER SURGERY  12/30/2018   right lun- mid lobe , dr gerhardt   POLYPECTOMY  11/18/2022   Procedure: POLYPECTOMY;  Surgeon: Lemar Lofty., MD;  Location: [facility] ENDOSCOPY;  Service: Gastroenterology;;   Gaspar Bidding DILATION N/A 11/18/2022   Procedure: Jacklyn Shell;  Surgeon: Lemar Lofty., MD;  Location: [facility] ENDOSCOPY;  Service: Gastroenterology;  Laterality: N/A;   SUBMUCOSAL LIFTING INJECTION  10/10/2018   Procedure: SUBMUCOSAL LIFTING INJECTION;  Surgeon: Meridee Score Netty Starring., MD;  Location: [name] ENDOSCOPY;  Service: Gastroenterology;;   TEE WITHOUT CARDIOVERSION  02/2018   TONSILLECTOMY     TOTAL HIP ARTHROPLASTY Left 11/2021   TUBAL LIGATION  1987   VIDEO ASSISTED THORACOSCOPY (VATS)/WEDGE RESECTION Right 12/30/2018   Procedure: VIDEO ASSISTED THORACOSCOPY (VATS), MINI THORACOTOMY, RIGHT MIDDLE LOBECTOMY WITH LYMPH NODE DISSECTION;  Surgeon: Delight Ovens, MD;  Location: [facility] OR;  Service: Thoracic;  Laterality: Right;   VIDEO BRONCHOSCOPY N/A 10/12/2018   Procedure: VIDEO BRONCHOSCOPY;  Surgeon: Delight Ovens, MD;  [facility] OR;  Service: Thoracic;  Laterality: N/A;   VIDEO BRONCHOSCOPY N/A 12/30/2018   Procedure: VIDEO BRONCHOSCOPY;  Surgeon: Delight Ovens, MD;  Location: [facility] OR;  Service: Thoracic;  Laterality: N/A;   WISDOM TOOTH EXTRACTION      REVIEW OF SYSTEMS:   Review of Systems  Constitutional: Negative  for appetite change, chills, fatigue, fever and unexpected weight change.  HENT: Negative for mouth sores, nosebleeds, sore throat and trouble swallowing.   Eyes: Negative for eye problems and icterus.  Respiratory: Positive for occasional dyspnea on exertion.  Negative for cough, hemoptysis, and wheezing.   Cardiovascular: Negative for chest pain and leg swelling.  Gastrointestinal: Negative for abdominal pain, constipation, diarrhea, nausea and vomiting.  Genitourinary: Negative for bladder incontinence, difficulty urinating, dysuria, frequency and hematuria.   Musculoskeletal: Negative for back pain, gait problem, neck pain and neck stiffness.  Skin: Negative for itching and  rash.  Neurological: Negative for dizziness, extremity weakness, gait problem, headaches, light-headedness and seizures.  Hematological: Negative for adenopathy. Does not bruise/bleed easily.  Psychiatric/Behavioral: Negative for confusion, depression and sleep disturbance. The patient is not nervous/anxious.     PHYSICAL EXAMINATION:  Blood pressure (!) 114/56, pulse 71, temperature 98.1 F (36.7 C), temperature source Oral, resp. rate 13, weight 181 lb 12.8 oz (82.5 kg), SpO2 97%.  ECOG PERFORMANCE STATUS: 0-1  Physical Exam  Constitutional: Oriented to person, place, and time and well-developed, well-nourished, and in no distress.  HENT:  Head: Normocephalic and atraumatic.  Mouth/Throat: Oropharynx is clear and moist. No oropharyngeal exudate.  Eyes: Conjunctivae are normal. Right eye exhibits no discharge. Left eye exhibits no discharge. No scleral icterus.  Neck: Normal range of motion. Neck supple.  Cardiovascular: Normal rate, regular rhythm, normal heart sounds and intact distal pulses.   Pulmonary/Chest: Effort normal and breath sounds normal. No respiratory distress. No wheezes. No rales.  Abdominal: Soft. Bowel sounds are normal. Exhibits no distension and no mass. There is no tenderness.   Musculoskeletal: Normal range of motion. Exhibits no edema.  Lymphadenopathy:    No cervical adenopathy.  Neurological: Alert and oriented to person, place, and time. Exhibits normal muscle tone. Gait normal. Coordination normal.  Skin: Skin is warm and dry. No rash noted. Not diaphoretic. No erythema. No pallor.  Psychiatric: Mood, memory and judgment normal.  Vitals reviewed.  LABORATORY DATA: Lab Results  Component Value Date   WBC 6.8 02/18/2023   HGB 14.6 02/18/2023   HCT 44.3 02/18/2023   MCV 87.9 02/18/2023   PLT 231 02/18/2023      Chemistry      Component Value Date/Time   NA 140 02/18/2023 1610   NA 142 11/16/2022 0951   K 4.3 02/18/2023 1610   CL 106 02/18/2023 1610   CO2 27 02/18/2023 1610   BUN 18 02/18/2023 1610   BUN 14 11/16/2022 0951   CREATININE 0.77 02/18/2023 1610      Component Value Date/Time   CALCIUM 10.2 02/18/2023 1610   ALKPHOS 65 02/18/2023 1610   AST 21 02/18/2023 1610   ALT 18 02/18/2023 1610   BILITOT 0.4 02/18/2023 1610       RADIOGRAPHIC STUDIES:  CT Chest W Contrast  Result Date: 02/24/2023 CLINICAL DATA:  Pulmonary neuroendocrine tumor; * Tracking Code: BO * EXAM: CT CHEST, ABDOMEN, AND PELVIS WITH CONTRAST TECHNIQUE: Multidetector CT imaging of the chest, abdomen and pelvis was performed following the standard protocol during bolus administration of intravenous contrast. RADIATION DOSE REDUCTION: This exam was performed according to the departmental dose-optimization program which includes automated exposure control, adjustment of the mA and/or kV according to patient size and/or use of iterative reconstruction technique. CONTRAST:  OMNIPAQUE IOHEXOL 300 MG/ML  SOLN COMPARISON:  Chest CT dated February 06, 2022; PET-CT dated November 21, 2020 FINDINGS: CT CHEST FINDINGS Cardiovascular: Normal heart size. No pericardial effusion. Normal caliber thoracic aorta with mild atherosclerotic disease. Mild coronary artery calcifications.  Mediastinum/Nodes: Small hiatal hernia. Thyroid is unremarkable. No enlarged lymph nodes seen in the chest. Lungs/Pleura: Stable postsurgical findings of prior right middle lobectomy. Remaining central airways are patent. Mild centrilobular emphysema. Two new adjacent solid nodules of the left upper lobe measuring 4 and 5 mm on series 501, image 80. No pleural effusion. Musculoskeletal: No aggressive appearing osseous lesions. CT ABDOMEN PELVIS FINDINGS Hepatobiliary: Tiny low-attenuation 5 mm lesion of the left lobe of the liver seen on series 503 image 396,  present on prior exams and likely a simple cyst. No suspicious liver lesion. Gallbladder is unremarkable. No biliary ductal dilation. Pancreas: Unremarkable. No pancreatic ductal dilatation or surrounding inflammatory changes. Spleen: Normal in size without focal abnormality. Adrenals/Urinary Tract: Bilateral adrenal glands are unremarkable. No hydronephrosis. Nonobstructing stones of the lower pole of the left kidney. Bladder is unremarkable. Stomach/Bowel: Stomach is within normal limits. Appendix appears normal. Diverticulosis. No evidence of bowel wall thickening, distention, or inflammatory changes. Vascular/Lymphatic: Aortic atherosclerosis. No enlarged abdominal or pelvic lymph nodes. Reproductive: Enlarged multi fibroid uterus, similar to prior PET-CT. Other: Small bilateral fat containing inguinal hernias. Musculoskeletal: Prior left total hip replacement. No aggressive appearing osseous lesions. IMPRESSION: 1. Stable postsurgical findings prior right middle lobectomy. No evidence of local recurrence or metastatic disease in the chest. 2. Two new nonspecific small solid nodules of the left upper lobe measuring 4 and 5 mm. Recommend follow-up chest CT in 3-6 months for further evaluation. 3. No evidence of metastatic disease in the abdomen or pelvis. 4. Aortic Atherosclerosis (ICD10-I70.0) and Emphysema (ICD10-J43.9). Electronically Signed   By: Allegra Lai M.D.   On: 02/24/2023 11:08   CT Abdomen Pelvis W Contrast  Result Date: 02/24/2023 CLINICAL DATA:  Pulmonary neuroendocrine tumor; * Tracking Code: BO * EXAM: CT CHEST, ABDOMEN, AND PELVIS WITH CONTRAST TECHNIQUE: Multidetector CT imaging of the chest, abdomen and pelvis was performed following the standard protocol during bolus administration of intravenous contrast. RADIATION DOSE REDUCTION: This exam was performed according to the departmental dose-optimization program which includes automated exposure control, adjustment of the mA and/or kV according to patient size and/or use of iterative reconstruction technique. CONTRAST:  OMNIPAQUE IOHEXOL 300 MG/ML  SOLN COMPARISON:  Chest CT dated February 06, 2022; PET-CT dated November 21, 2020 FINDINGS: CT CHEST FINDINGS Cardiovascular: Normal heart size. No pericardial effusion. Normal caliber thoracic aorta with mild atherosclerotic disease. Mild coronary artery calcifications. Mediastinum/Nodes: Small hiatal hernia. Thyroid is unremarkable. No enlarged lymph nodes seen in the chest. Lungs/Pleura: Stable postsurgical findings of prior right middle lobectomy. Remaining central airways are patent. Mild centrilobular emphysema. Two new adjacent solid nodules of the left upper lobe measuring 4 and 5 mm on series 501, image 80. No pleural effusion. Musculoskeletal: No aggressive appearing osseous lesions. CT ABDOMEN PELVIS FINDINGS Hepatobiliary: Tiny low-attenuation 5 mm lesion of the left lobe of the liver seen on series 503 image 396, present on prior exams and likely a simple cyst. No suspicious liver lesion. Gallbladder is unremarkable. No biliary ductal dilation. Pancreas: Unremarkable. No pancreatic ductal dilatation or surrounding inflammatory changes. Spleen: Normal in size without focal abnormality. Adrenals/Urinary Tract: Bilateral adrenal glands are unremarkable. No hydronephrosis. Nonobstructing stones of the lower pole of the left kidney.  Bladder is unremarkable. Stomach/Bowel: Stomach is within normal limits. Appendix appears normal. Diverticulosis. No evidence of bowel wall thickening, distention, or inflammatory changes. Vascular/Lymphatic: Aortic atherosclerosis. No enlarged abdominal or pelvic lymph nodes. Reproductive: Enlarged multi fibroid uterus, similar to prior PET-CT. Other: Small bilateral fat containing inguinal hernias. Musculoskeletal: Prior left total hip replacement. No aggressive appearing osseous lesions. IMPRESSION: 1. Stable postsurgical findings prior right middle lobectomy. No evidence of local recurrence or metastatic disease in the chest. 2. Two new nonspecific small solid nodules of the left upper lobe measuring 4 and 5 mm. Recommend follow-up chest CT in 3-6 months for further evaluation. 3. No evidence of metastatic disease in the abdomen or pelvis. 4. Aortic Atherosclerosis (ICD10-I70.0) and Emphysema (ICD10-J43.9). Electronically Signed   By: Tacey Ruiz  Strickland M.D.   On: 02/24/2023 11:08     ASSESSMENT/PLAN:  This is a very pleasant 62 year old Caucasian female with history of stage Ia (T1a, N0, M0) typical carcinoid of the lung status post right middle lobectomy with lymph node dissection on December 30, 2018 under the care of Dr. Tyrone Sage. The patient also has a history of rectal carcinoid tumor status post surgical resection in 2019.   The patient is currently on observation and is seen on a annual basis.  The patient recently had a restaging CT scan.  The patient was seen with Dr. Arbutus Ped today.  Dr. Arbutus Ped personally and independently reviewed the scan discussed results with the patient today.  The scan showed no evidence of local or metastatic disease in the chest or any evidence of metastatic disease in the abdomen or pelvis.  There is 2 nonspecific small solid nodules in the left upper lobe measuring 4 and 5 mm and attention to follow-up was recommended.  Dr. Arbutus Ped recommends that she continue on  observation with a repeat CT scan of the chest only in 6 months.  We will arrange for CT of the abdomen pelvis on an annual basis.  We will see her back for follow-up visit in 6 months after her restaging scan.   The patient was advised to call immediately if she has any concerning symptoms in the interval. The patient voices understanding of current disease status and treatment options and is in agreement with the current care plan. All questions were answered. The patient knows to call the clinic with any problems, questions or concerns. We can certainly see the patient much sooner if necessary    Orders Placed This Encounter  Procedures   CT Chest W Contrast    Standing Status:   Future    Standing Expiration Date:   03/02/2024    Order Specific Question:   If indicated for the ordered procedure, I authorize the administration of contrast media per Radiology protocol    Answer:   Yes    Order Specific Question:   Does the patient have a contrast media/X-ray dye allergy?    Answer:   No    Order Specific Question:   Preferred imaging location?    Answer:   [name]   CBC with Differential ([name])    Standing Status:   Future    Standing Expiration Date:   03/02/2024   CMP ([name])    Standing Status:   Future    Standing Expiration Date:   03/02/2024     [name] , PA-C 03/03/23  ADDENDUM: Hematology/Oncology Attending:  I had a face-to-face encounter with the patient today.  I reviewed her record, lab, scan and recommended her care plan.  This is a very pleasant 61 years old white female with history of stage Ia typical carcinoid of the lung diagnosed in 2020 in addition to history of rectal carcinoid tumor diagnosed in 2019.  She is status post surgical resection of the rectal carcinoid tumor as well as right middle lobectomy with lymph node dissection on December 30, 2018.  The patient has been on observation since that time and she is  feeling fine. She had repeat CT scan of the chest, abdomen and pelvis performed recently.  I personally and independently reviewed the scan images and discussed the result with the patient today. Her scan showed no concerning findings for disease recurrence or metastasis except for 2 nonspecific solid nodules of  the left upper lobe measuring 5 and 4 mm. I recommended for the patient to continue on observation with repeat CT scan of the chest in 6 months. She was advised to call immediately if she has any other concerning symptoms in the interval. The total time spent in the appointment was 20 minutes. Disclaimer: This note was dictated with voice recognition software. Similar sounding words can inadvertently be transcribed and may be missed upon review. Lajuana Matte, MD

## 2023-03-04 DIAGNOSIS — N950 Postmenopausal bleeding: Secondary | ICD-10-CM

## 2023-03-04 DIAGNOSIS — Z01818 Encounter for other preprocedural examination: Secondary | ICD-10-CM

## 2023-03-10 ENCOUNTER — Other Ambulatory Visit: Payer: Self-pay

## 2023-03-10 DIAGNOSIS — M160 Bilateral primary osteoarthritis of hip: Secondary | ICD-10-CM

## 2023-03-10 MED ORDER — TRAMADOL HCL 50 MG PO TABS
50.0000 mg | ORAL_TABLET | Freq: Two times a day (BID) | ORAL | 0 refills | Status: AC | PRN
Start: 2023-03-10 — End: ?

## 2023-03-10 NOTE — Progress Notes (Unsigned)
Cardiology Office Note:  .    Date:  03/11/2023  ID:  Shannon [name], DOB 31-Oct-1960, MRN [id number] PCP: Shannon Sofia, PA-C  [facility] Providers Cardiologist:  None     CC: Preop-eval Consulted for the evaluation of pre-operative risk stratification at the behest of Shannon [name]  History of Present Illness: .    Shannon [name] is a 62 y.o. 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  2019: Saw Dr. Eden Emms 2024L recently seen by PCCM for lung risk stratification.  Recommendations made with no barrires to proceed  Patient notes that she is planned for hysterectomy..   She incidentally was found to have CAC. She feels great: She works Education officer, environmental houses.  She walks 2-3 miles for exercise.  Cannot run due to joint pain.  No issues with stairs.   Has had no chest pain, chest pressure, chest tightness, chest stinging .  No shortness of breath, DOE .  No PND or orthopnea.  No weight gain, leg swelling , or abdominal swelling.  No syncope or near syncope . Notes  no palpitations or funny heart beats.     Patient reports prior cardiac testing including normal stress test and echo in the past.  Relevant histories: .  Social Retired Engineer, petroleum, former Midwife patient, now works Education officer, environmental houses. ROS: As per HPI.   Studies Reviewed: .   Cardiac Studies & Procedures     STRESS TESTS  EXERCISE TOLERANCE TEST (ETT) 03/23/2018  Narrative  There was no ST segment deviation noted during stress.  Pt walked for 9:00 of a Bruce protocol GXT. Peak HR of 153 which is 93% predicted max HR  There were no ST or T wave changes to suggest ischemia  Blood pressure demonstrated a hypertensive response to exercise.  No arrhythmias  This is interpreted as a negagative stress test. no evidence of ischemia .   ECHOCARDIOGRAM  ECHOCARDIOGRAM COMPLETE 03/23/2018  Narrative *[facility]* [address] [place], [place] [address] [phone]  ------------------------------------------------------------------- Transthoracic Echocardiography  Patient:    Shannon [name] MR #:       [id number] Study Date: 03/23/2018 Gender:     F Age:        65 Height:     166.4 cm Weight:     86.6 kg BSA:        2.03 m^2 Pt. Status: Room:  Layla Maw, M.D. REFERRING    Charlton Haws, M.D. SONOGRAPHER  [place], [place] ATTENDING    Donato Schultz, M.D. PERFORMING   [facility], Outpatient  cc:  ------------------------------------------------------------------- LV EF: 55% -   60%  ------------------------------------------------------------------- Indications:      (R07.9).  ------------------------------------------------------------------- History:   PMH:  Acquired from the patient and from the patient&'s chart.  Chest pain.  Dyspnea.  Risk factors:  Former tobacco use. Dyslipidemia.  ------------------------------------------------------------------- Study Conclusions  - Left ventricle: The cavity size was normal. Wall thickness was normal. Systolic function was normal. The estimated ejection fraction was in the range of 55% to 60%. Wall motion was normal; there were no regional wall motion abnormalities. Doppler parameters are consistent with abnormal left ventricular relaxation (grade 1 diastolic dysfunction).  ------------------------------------------------------------------- Study data:  No prior study was available for comparison.  Study status:  Routine.  Procedure:  The patient reported no pain pre or post test. Transthoracic echocardiography for left ventricular function evaluation. Image quality was adequate.  Study completion:  There were no complications.          Transthoracic echocardiography.  M-mode, complete 2D, spectral Doppler, and color Doppler.  Birthdate:  Patient birthdate: 01/24/1961.  Age:  Patient is 62 yr old.  Sex:  Gender: female.    BMI: 31.3  kg/m^2.  Blood pressure:     132/82  Patient status:  Outpatient.  Study date: Study date: 03/23/2018. Study time: 03:52 PM.  Location:  [facility]  -------------------------------------------------------------------  ------------------------------------------------------------------- Left ventricle:  The cavity size was normal. Wall thickness was normal. Systolic function was normal. The estimated ejection fraction was in the range of 55% to 60%. Wall motion was normal; there were no regional wall motion abnormalities. Doppler parameters are consistent with abnormal left ventricular relaxation (grade 1 diastolic dysfunction).  ------------------------------------------------------------------- Aortic valve:   Trileaflet; normal thickness leaflets. Mobility was not restricted.  Doppler:  Transvalvular velocity was within the normal range. There was no stenosis. There was no regurgitation.  ------------------------------------------------------------------- Aorta:  Aortic root: The aortic root was normal in size.  ------------------------------------------------------------------- Mitral valve:   Structurally normal valve.   Mobility was not restricted.  Doppler:  Transvalvular velocity was within the normal range. There was no evidence for stenosis. There was no regurgitation.    Valve area by pressure half-time: 2.27 cm^2. Indexed valve area by pressure half-time: 1.12 cm^2/m^2.    Peak gradient (D): 2 mm Hg.  ------------------------------------------------------------------- Left atrium:  The atrium was normal in size.  ------------------------------------------------------------------- Right ventricle:  The cavity size was normal. Wall thickness was normal. Systolic function was normal.  ------------------------------------------------------------------- Pulmonic valve:    Doppler:  Transvalvular velocity was within the normal range. There was no evidence for  stenosis.  ------------------------------------------------------------------- Tricuspid valve:   Structurally normal valve.    Doppler: Transvalvular velocity was within the normal range. There was no regurgitation.  ------------------------------------------------------------------- Pulmonary artery:   The main pulmonary artery was normal-sized. Systolic pressure was within the normal range.  ------------------------------------------------------------------- Right atrium:  The atrium was normal in size.  ------------------------------------------------------------------- Pericardium:  There was no pericardial effusion.  ------------------------------------------------------------------- Systemic veins: Inferior vena cava: The vessel was normal in size.  ------------------------------------------------------------------- Measurements  Left ventricle                         Value          Reference LV ID, ED, PLAX chordal        (L)     41    mm       43 - 52 LV ID, ES, PLAX chordal                28    mm       23 - 38 LV fx shortening, PLAX chordal         32    %        >=29 LV PW thickness, ED                    11    mm       ---------- IVS/LV PW ratio, ED                    0.91           <=1.3 Stroke volume, 2D                      79  ml       ---------- Stroke volume/bsa, 2D                  39    ml/m^2   ---------- LV e&', lateral                         9.79  cm/s     ---------- LV E/e&', lateral                       7.63           ---------- LV e&', medial                          8.49  cm/s     ---------- LV E/e&', medial                        8.8            ---------- LV e&', average                         9.14  cm/s     ---------- LV E/e&', average                       8.17           ----------  Ventricular septum                     Value          Reference IVS thickness, ED                      10    mm       ----------  LVOT                                    Value          Reference LVOT ID, S                             20    mm       ---------- LVOT area                              3.14  cm^2     ---------- LVOT ID                                20    mm       ---------- LVOT peak velocity, S                  132   cm/s     ---------- LVOT mean velocity, S                  82.5  cm/s     ---------- LVOT VTI, S                            25.3  cm       ---------- LVOT  peak gradient, S                  7     mm Hg    ---------- Stroke volume (SV), LVOT DP            79.5  ml       ---------- Stroke index (SV/bsa), LVOT DP         39.1  ml/m^2   ----------  Aorta                                  Value          Reference Aortic root ID, ED                     29    mm       ---------- Ascending aorta ID, A-P, S             25    mm       ----------  Left atrium                            Value          Reference LA ID, A-P, ES                         30    mm       ---------- LA ID/bsa, A-P                         1.48  cm/m^2   <=2.2 LA volume, S                           44.3  ml       ---------- LA volume/bsa, S                       21.8  ml/m^2   ---------- LA volume, ES, 1-p A4C                 46.2  ml       ---------- LA volume/bsa, ES, 1-p A4C             22.8  ml/m^2   ---------- LA volume, ES, 1-p A2C                 43.2  ml       ---------- LA volume/bsa, ES, 1-p A2C             21.3  ml/m^2   ----------  Mitral valve                           Value          Reference Mitral E-wave peak velocity            74.7  cm/s     ---------- Mitral A-wave peak velocity            92.4  cm/s     ---------- Mitral deceleration time       (H)     330   ms       150 - 230 Mitral pressure half-time  97    ms       ---------- Mitral peak gradient, D                2     mm Hg    ---------- Mitral E/A ratio, peak                 0.8            ---------- Mitral valve area, PHT, DP             2.27  cm^2      ---------- Mitral valve area/bsa, PHT, DP         1.12  cm^2/m^2 ----------  Right atrium                           Value          Reference RA ID, S-I, ES, A4C                    46.6  mm       34 - 49 RA area, ES, A4C                       12.1  cm^2     8.3 - 19.5 RA volume, ES, A/L                     25.8  ml       ---------- RA volume/bsa, ES, A/L                 12.7  ml/m^2   ----------  Systemic veins                         Value          Reference Estimated CVP                          3     mm Hg    ----------  Right ventricle                        Value          Reference TAPSE                                  19.1  mm       ---------- RV s&', lateral, S                      10.3  cm/s     ----------  Legend: (L)  and  (H)  mark values outside specified reference range.  ------------------------------------------------------------------- Prepared and Electronically Authenticated by  Donato Schultz, M.D. 2019-08-28T17:05:54             Physical Exam:    VS:  BP 118/72   Pulse 70   Ht 5' 5.5" (1.664 m)   Wt 181 lb (82.1 kg)   LMP  (LMP Unknown)   SpO2 97%   BMI 29.66 kg/m    Wt Readings from Last 3 Encounters:  03/11/23 181 lb (82.1 kg)  03/03/23 181 lb 12.8 oz (82.5 kg)  02/24/23 181 lb (82.1 kg)    Gen: no distress   Neck: No JVD,  Cardiac: No Rubs or Gallops, no murmur, RRR +2 radial pulses Respiratory: Clear to auscultation bilaterally, normal effort, normal  respiratory rate GI: Soft, nontender, non-distended  MS: No  edema;  moves all extremities Integument: Skin feels warm Neuro:  At time of evaluation, alert and oriented to person/place/time/situation  Psych: Normal affect, patient feels well   ASSESSMENT AND PLAN: .    Preoperative Risk Assessment  - The Revised Cardiac Risk Index = 1 due to Incidental CAC  - this which equates to 0.9%: low estimated risk of perioperative myocardial infarction, pulmonary edema, ventricular fibrillation,  cardiac arrest, or complete heart block.  - DASI score of 33 associated with 6.79 functional mets - No further cardiac testing is recommended prior to surgery.  - Our service is available as needed in the peri-operative period.    CAC Aortic atherosclerosis HLD - complicated by statin myopathy - on repatha with LDL above goal - Will recheck in December; may add Zetia; in the future may trial Inclisiran - in December, add ASA 81 mg PO daily  HTN - continue current medications  COPD Prior lung cancer (2020) - stable, following pulm recs  December 2024 with APP One year with me     [name], MD FASE [facility] Cardiologist [facility]  [facility]  [address] [place], [address] [place], [place] [address] [phone]  3:54 PM

## 2023-03-11 ENCOUNTER — Ambulatory Visit: Payer: BC Managed Care – PPO | Attending: Internal Medicine | Admitting: Internal Medicine

## 2023-03-11 ENCOUNTER — Encounter: Payer: Self-pay | Admitting: Internal Medicine

## 2023-03-11 VITALS — BP 118/72 | HR 70 | Ht 65.5 in | Wt 181.0 lb

## 2023-03-11 DIAGNOSIS — Z902 Acquired absence of lung [part of]: Secondary | ICD-10-CM | POA: Diagnosis not present

## 2023-03-11 DIAGNOSIS — Z01818 Encounter for other preprocedural examination: Secondary | ICD-10-CM | POA: Insufficient documentation

## 2023-03-11 DIAGNOSIS — R0609 Other forms of dyspnea: Secondary | ICD-10-CM

## 2023-03-11 DIAGNOSIS — E782 Mixed hyperlipidemia: Secondary | ICD-10-CM | POA: Diagnosis not present

## 2023-03-11 DIAGNOSIS — Z01810 Encounter for preprocedural cardiovascular examination: Secondary | ICD-10-CM

## 2023-03-11 DIAGNOSIS — I2510 Atherosclerotic heart disease of native coronary artery without angina pectoris: Secondary | ICD-10-CM

## 2023-03-11 DIAGNOSIS — I2584 Coronary atherosclerosis due to calcified coronary lesion: Secondary | ICD-10-CM

## 2023-03-11 DIAGNOSIS — I700 Atherosclerosis of aorta: Secondary | ICD-10-CM

## 2023-03-11 NOTE — Patient Instructions (Signed)
Medication Instructions:  Your physician recommends that you continue on your current medications as directed. Please refer to the Current Medication list given to you today.  *If you need a refill on your cardiac medications before your next appointment, please call your pharmacy*   Lab Work: NONE If you have labs (blood work) drawn today and your tests are completely normal, you will receive your results only by: MyChart Message (if you have MyChart) OR A paper copy in the mail If you have any lab test that is abnormal or we need to change your treatment, we will call you to review the results.   Testing/Procedures: NONE   Follow-Up: At [facility], you and your health needs are our priority.  As part of our continuing mission to provide you with exceptional heart care, we have created designated Provider Care Teams.  These Care Teams include your primary Cardiologist (physician) and Advanced Practice Providers (APPs -  Physician Assistants and Nurse Practitioners) who all work together to provide you with the care you need, when you need it.    Your next appointment:   4 month(s)  Provider:   Jari Favre, PA-C, Ronie Spies, PA-C, Robin Searing, NP, Jacolyn Reedy, PA-C, Eligha Bridegroom, NP, Tereso Newcomer, PA-C, or Perlie Gold, PA-C

## 2023-03-12 ENCOUNTER — Telehealth: Payer: Self-pay

## 2023-03-12 ENCOUNTER — Other Ambulatory Visit: Payer: Self-pay | Admitting: Physician Assistant

## 2023-03-12 DIAGNOSIS — M160 Bilateral primary osteoarthritis of hip: Secondary | ICD-10-CM

## 2023-03-12 NOTE — Telephone Encounter (Signed)
Patient called and stated her insurance is not wanting to cover her tramadol for twice a day and wanted to know what her other options are for pain.   Made patient aware I will call her pharmacy and see what's going on and give her a call back. Called her pharmacy and they are request a PA from insurance  Patient Made Aware, Verbalized Understanding.

## 2023-03-16 ENCOUNTER — Telehealth: Payer: Self-pay

## 2023-03-16 NOTE — Telephone Encounter (Signed)
Patient called and stated that she would like a referral  Rheumatologists. Please advise

## 2023-03-16 NOTE — Telephone Encounter (Signed)
Patient stated that she was on the website for [place] or either [place] rheumatologist and they stated they treat pain management for osteoarthritis.  Patient would like referral sent to [facility] rheumatologist for pain management.

## 2023-03-16 NOTE — Telephone Encounter (Signed)
Please call and ask why does she want referral ? Will need a diagnosis in order to make referral because they review to see if appropriate--- as far as I know she does not have rheumatoid arthritis ?? If she would like labwork done to check for that we can order that ----

## 2023-03-17 ENCOUNTER — Other Ambulatory Visit: Payer: Self-pay | Admitting: Physician Assistant

## 2023-03-17 DIAGNOSIS — M160 Bilateral primary osteoarthritis of hip: Secondary | ICD-10-CM

## 2023-03-17 NOTE — Telephone Encounter (Signed)
Referral made 

## 2023-03-18 ENCOUNTER — Encounter: Payer: Self-pay | Admitting: Internal Medicine

## 2023-03-18 ENCOUNTER — Ambulatory Visit (AMBULATORY_SURGERY_CENTER): Payer: BC Managed Care – PPO

## 2023-03-18 VITALS — Ht 65.5 in | Wt 181.0 lb

## 2023-03-18 DIAGNOSIS — K51819 Other ulcerative colitis with unspecified complications: Secondary | ICD-10-CM

## 2023-03-18 DIAGNOSIS — K219 Gastro-esophageal reflux disease without esophagitis: Secondary | ICD-10-CM

## 2023-03-18 NOTE — Progress Notes (Signed)

## 2023-03-22 NOTE — Progress Notes (Unsigned)
Office Visit Note  Patient: Shannon [name]             Date of Birth: October 07, 1960           MRN: [id number]             PCP: [name], PA-C Referring: [name], PA-C Visit Date: 03/23/2023 Occupation: @GUAROCC @  Subjective:  Pain in multiple joints   History of Present Illness: Shannon [name] is a 62 y.o. female who presents today for a new patient consultation.  Patient reports that she has been experiencing chronic pain involving multiple joints for the past 5 years.  Patient states that she has been under the care of [facility] and her primary care.  She states that her arthralgias and joint stiffness were previously well-controlled taking meloxicam.  Patient states that she had to discontinue meloxicam 2 months ago due to developing a gastric ulcer.  Patient states that she is scheduled for an EGD next week to see if the ulcer has healed.  She has been taking omeprazole twice daily as prescribed.  Patient states that she was switched from meloxicam to tramadol which she took for 3 weeks.  She discontinued tramadol due to headaches and nausea.  She states she recently tried taking tylenol arthritis but developed GI upset/diarrhea, so she did not take a dose last night.  She had difficulty sleeping last night due to the severity of pain and stiffness.  Patient states her pain is worse at night but her stiffness has been lasting all day.  Patient states that her pain is most severe in her lower back.  She has been having radiating pain to the left hip as well.  Her left hip was previously replaced by Dr. Eulah Pont.  She has had lower back injections performed by Dr. Maurice Small in the past.  She has been going to physical therapy 2 to 3 days/week as well as performing home exercises for her back.  Patient states about 3 weeks ago she thinks she overdid it performing back exercises and has had increased discomfort since then.  Patient states she has stiffness in both hands and notices  swelling when taking off her rings.  Patient denies any discomfort in her knee joints at this time but has had cortisone injections performed in her knees in the past.  Patient denies any personal or family history of uveitis or psoriasis.  Patient states that her mother has fibromyalgia, brother has Crohn's disease, and daughter has eczema.   Activities of Daily Living:  Patient reports morning stiffness for all day. Patient Reports nocturnal pain.  Difficulty dressing/grooming: Denies Difficulty climbing stairs: Reports Difficulty getting out of chair: Reports Difficulty using hands for taps, buttons, cutlery, and/or writing: Denies  Review of Systems  Constitutional:  Positive for fatigue.  HENT:  Negative for mouth sores and mouth dryness.   Eyes:  Negative for dryness.  Respiratory:  Positive for shortness of breath. Negative for cough and wheezing.   Cardiovascular:  Negative for chest pain and palpitations.  Gastrointestinal:  Positive for constipation and diarrhea. Negative for blood in stool.  Endocrine: Negative for increased urination.  Genitourinary:  Negative for involuntary urination.  Musculoskeletal:  Positive for joint pain, gait problem, joint pain, joint swelling, myalgias, muscle weakness, morning stiffness, muscle tenderness and myalgias.  Skin:  Positive for color change and sensitivity to sunlight. Negative for rash and hair loss.  Allergic/Immunologic: Negative for susceptible to infections.  Neurological:  Negative for  dizziness and headaches.  Hematological:  Negative for swollen glands.  Psychiatric/Behavioral:  Positive for sleep disturbance. Negative for depressed mood. The patient is nervous/anxious.     PMFS History:  Patient Active Problem List   Diagnosis Date Noted   Preoperative clearance 03/11/2023   Aortic atherosclerosis (HCC) 03/11/2023   Coronary artery calcification 03/11/2023   Exertional dyspnea 02/24/2023   Primary osteoarthritis of both  hips 02/24/2023   Frequency of urination 07/08/2022   Postmenopausal bleeding 07/08/2022   Gastroesophageal reflux disease with esophagitis without hemorrhage 04/02/2022   Centrilobular emphysema (HCC) 10/20/2021   Pain in both lower extremities 10/29/2020   Preop cardiovascular exam 04/30/2020   Other fatigue 04/30/2020   Needs flu shot 04/30/2020   Mixed hyperlipidemia 10/25/2019   Vitamin D insufficiency 10/25/2019   Gross hematuria 10/25/2019   Anxiety 10/25/2019   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   Anxiety    Follows w/ PCP, Dr. Harless Nakayama.   Arthritis    back, hips, shoulders, hands, knees   Asthma    Follows w/ pulmonology, Dr. [name].   Bronchial carcinoid tumors    s/p right middle lobe lobectomy   Cancer (HCC)    rectal, Lung   Dyspnea    with exertion   Emphysema of lung [facility])    Follows w/ pulmonology, Dr. [name].   Gastritis    Found on 10/2022 EGD. Following w/ Dr. Marina Goodell, GI.   GERD (gastroesophageal reflux disease)    History of kidney stones    passed   HLD (hyperlipidemia)    Follows w/ PCP, Dr. Harless Nakayama.   Hypertension    Follows w/ PCP, Benita Stabile @ [facility].   Pneumonia 02/2018   Rectal tumor    neuroendocrine carcinoma of rectum, follows w/ GI , Dr. Sherre Lain and Dr. Shirline Frees at [facility].   Seasonal allergies    Wears glasses     Family History  Problem Relation Age of Onset   Heart disease Mother    COPD Mother    Emphysema Mother    Fibromyalgia Mother    Arthritis Mother    Diabetes Father    Healthy Son    Healthy Daughter    Colon cancer Neg Hx    Colon polyps Neg Hx     Esophageal cancer Neg Hx    Rectal cancer Neg Hx    Stomach cancer Neg Hx    Past Surgical History:  Procedure Laterality Date   ABLATION     endometrial   BIOPSY  10/10/2018   Procedure: BIOPSY;  Surgeon: Meridee Score, Netty Starring., MD;  Location: [facility] ENDOSCOPY;  Service: Gastroenterology;;   BIOPSY  06/12/2019   Procedure: BIOPSY;  Surgeon: Lemar Lofty., MD;  Location: [facility] ENDOSCOPY;  Service: Gastroenterology;;   BIOPSY  09/30/2020   Procedure: BIOPSY;  Surgeon: Lemar Lofty., MD;  Location: [facility] ENDOSCOPY;  Service: Gastroenterology;;   BIOPSY  11/18/2022   Procedure: BIOPSY;  Surgeon: Lemar Lofty., MD;  Location: [facility] ENDOSCOPY;  Service: Gastroenterology;;   COLONOSCOPY     COLONOSCOPY WITH PROPOFOL N/A 11/18/2022   Procedure: COLONOSCOPY WITH PROPOFOL;  Surgeon:  Mansouraty, Netty Starring., MD;  Location: [facility] ENDOSCOPY;  Service: Gastroenterology;  Laterality: N/A;   DOPPLER ECHOCARDIOGRAPHY     ENDOSCOPIC MUCOSAL RESECTION N/A 10/10/2018   Procedure: ENDOSCOPIC MUCOSAL RESECTION;  Surgeon: Meridee Score, Netty Starring., MD;  Location: [facility] ENDOSCOPY;  Service: Gastroenterology;  Laterality: N/A;   ESOPHAGOGASTRODUODENOSCOPY N/A 11/18/2022   Procedure: ESOPHAGOGASTRODUODENOSCOPY (EGD);  Surgeon: Lemar Lofty., MD;  Location: [facility] ENDOSCOPY;  Service: Gastroenterology;  Laterality: N/A;   EUS N/A 07/18/2018   Procedure: LOWER ENDOSCOPIC ULTRASOUND (EUS);  Surgeon: Lemar Lofty., MD;  Location: [facility] ENDOSCOPY;  Service: Gastroenterology;  Laterality: N/A;   EUS N/A 10/10/2018   Procedure: LOWER ENDOSCOPIC ULTRASOUND (EUS);  Surgeon: Lemar Lofty., MD;  Location: [facility] ENDOSCOPY;  Service: Gastroenterology;  Laterality: N/A;   EUS N/A 06/12/2019   Procedure: LOWER ENDOSCOPIC ULTRASOUND (EUS);  Surgeon: Lemar Lofty., MD;  Location: [facility] ENDOSCOPY;  Service: Gastroenterology;  Laterality: N/A;   EUS N/A 09/30/2020   Procedure: LOWER ENDOSCOPIC  ULTRASOUND (EUS);  Surgeon: Lemar Lofty., MD;  Location: [facility] ENDOSCOPY;  Service: Gastroenterology;  Laterality: N/A;   EUS N/A 11/18/2022   Procedure: LOWER ENDOSCOPIC ULTRASOUND (EUS);  Surgeon: Lemar Lofty., MD;  Location: [facility] ENDOSCOPY;  Service: Gastroenterology;  Laterality: N/A;   FLEXIBLE SIGMOIDOSCOPY N/A 07/18/2018   Procedure: FLEXIBLE SIGMOIDOSCOPY;  Surgeon: Meridee Score Netty Starring., MD;  Location: [facility] ENDOSCOPY;  Service: Gastroenterology;  Laterality: N/A;   FLEXIBLE SIGMOIDOSCOPY N/A 10/10/2018   Procedure: FLEXIBLE SIGMOIDOSCOPY;  Surgeon: Meridee Score Netty Starring., MD;  Location: [facility] ENDOSCOPY;  Service: Gastroenterology;  Laterality: N/A;   FLEXIBLE SIGMOIDOSCOPY N/A 06/12/2019   Procedure: FLEXIBLE SIGMOIDOSCOPY;  Surgeon: Meridee Score Netty Starring., MD;  Location: [facility] ENDOSCOPY;  Service: Gastroenterology;  Laterality: N/A;   FLEXIBLE SIGMOIDOSCOPY N/A 09/30/2020   Procedure: FLEXIBLE SIGMOIDOSCOPY;  Surgeon: Meridee Score Netty Starring., MD;  Location: [facility] ENDOSCOPY;  Service: Gastroenterology;  Laterality: N/A;   HEMOSTASIS CLIP PLACEMENT  10/10/2018   Procedure: HEMOSTASIS CLIP PLACEMENT;  Surgeon: Lemar Lofty., MD;  Location: [facility] ENDOSCOPY;  Service: Gastroenterology;;   LUNG CANCER SURGERY  12/30/2018   right lun- mid lobe , dr gerhardt   POLYPECTOMY  11/18/2022   Procedure: POLYPECTOMY;  Surgeon: Lemar Lofty., MD;  Location: [facility] ENDOSCOPY;  Service: Gastroenterology;;   Gaspar Bidding DILATION N/A 11/18/2022   Procedure: Jacklyn Shell;  Surgeon: Lemar Lofty., MD;  Location: [facility] ENDOSCOPY;  Service: Gastroenterology;  Laterality: N/A;   SUBMUCOSAL LIFTING INJECTION  10/10/2018   Procedure: SUBMUCOSAL LIFTING INJECTION;  Surgeon: Meridee Score Netty Starring., MD;  Location: [facility] ENDOSCOPY;  Service: Gastroenterology;;   TEE WITHOUT CARDIOVERSION  02/2018   TONSILLECTOMY     TOTAL HIP ARTHROPLASTY Left 11/2021   TUBAL LIGATION  1987   VIDEO ASSISTED  THORACOSCOPY (VATS)/WEDGE RESECTION Right 12/30/2018   Procedure: VIDEO ASSISTED THORACOSCOPY (VATS), MINI THORACOTOMY, RIGHT MIDDLE LOBECTOMY WITH LYMPH NODE DISSECTION;  Surgeon: Delight Ovens, MD;  Location: [facility] OR;  Service: Thoracic;  Laterality: Right;   VIDEO BRONCHOSCOPY N/A 10/12/2018   Procedure: VIDEO BRONCHOSCOPY;  Surgeon: Delight Ovens, MD;  Location: [facility] OR;  Service: Thoracic;  Laterality: N/A;   VIDEO BRONCHOSCOPY N/A 12/30/2018   Procedure: VIDEO BRONCHOSCOPY;  Surgeon: Delight Ovens, MD;  Location[facility] OR;  Service: Thoracic;  Laterality: N/A;   WISDOM TOOTH EXTRACTION     Social History   Social History Narrative   Not on file   Immunization History  Administered Date(s) Administered   Influenza Inj Mdck Quad Pf 05/25/2017, 04/30/2020,  05/14/2021, 07/08/2022   Influenza,inj,Quad PF,6+ Mos 05/24/2018, 05/24/2018   PFIZER(Purple Top)SARS-COV-2 Vaccination 09/25/2019, 10/16/2019, 06/27/2020   Tdap 09/15/2017   Zoster Recombinant(Shingrix) 09/11/2021, 02/25/2022     Objective: Vital Signs: BP 111/71 (BP Location: Right Arm, Patient Position: Sitting, Cuff Size: Normal)   Pulse 77   Resp 16   Ht 5\' 5"  (1.651 m)   Wt 179 lb 9.6 oz (81.5 kg)   LMP  (LMP Unknown)   BMI 29.89 kg/m    Physical Exam Vitals and nursing note reviewed.  Constitutional:      Appearance: She is well-developed.  HENT:     Head: Normocephalic and atraumatic.  Eyes:     Conjunctiva/sclera: Conjunctivae normal.  Cardiovascular:     Rate and Rhythm: Normal rate and regular rhythm.     Heart sounds: Normal heart sounds.  Pulmonary:     Effort: Pulmonary effort is normal.     Breath sounds: Normal breath sounds.  Abdominal:     General: Bowel sounds are normal.     Palpations: Abdomen is soft.  Musculoskeletal:     Cervical back: Normal range of motion.  Lymphadenopathy:     Cervical: No cervical adenopathy.  Skin:    General: Skin is warm and dry.     Capillary Refill:  Capillary refill takes less than 2 seconds.  Neurological:     Mental Status: She is alert and oriented to person, place, and time.  Psychiatric:        Behavior: Behavior normal.      Musculoskeletal Exam: C-spine has good ROM. Limited mobility of lumbar spine due to discomfort.  Midline spinal tenderness in the lumbar region.  Shoulder joints, elbow joints, wrist joints, MCPs, PIPs, and DIPs good ROM with no synovitis.  DIP thickening noted. Complete fist formation bilaterally.  Right hip joint has good ROM. Left hip replacement has good ROM.  Knee joints have good ROM with no warmth or effusion.  Ankle joints have good ROM with no tenderness or joint swelling.    CDAI Exam: CDAI Score: -- Patient Global: --; Provider Global: -- Swollen: --; Tender: -- Joint Exam 03/23/2023   No joint exam has been documented for this visit   There is currently no information documented on the homunculus. Go to the Rheumatology activity and complete the homunculus joint exam.  Investigation: No additional findings.  Imaging: No results found.  Recent Labs: Lab Results  Component Value Date   WBC 6.8 02/18/2023   HGB 14.6 02/18/2023   PLT 231 02/18/2023   NA 140 02/18/2023   K 4.3 02/18/2023   CL 106 02/18/2023   CO2 27 02/18/2023   GLUCOSE 76 02/18/2023   BUN 18 02/18/2023   CREATININE 0.77 02/18/2023   BILITOT 0.4 02/18/2023   ALKPHOS 65 02/18/2023   AST 21 02/18/2023   ALT 18 02/18/2023   PROT 6.7 02/18/2023   ALBUMIN 4.5 02/18/2023   CALCIUM 10.2 02/18/2023   GFRAA 86 04/30/2020    Speciality Comments: No specialty comments available.  Procedures:  No procedures performed Allergies: Hydrocodone, Pravastatin, Levaquin [levofloxacin in d5w], and Other   Assessment / Plan:     Visit Diagnoses: Polyarthralgia -Patient presents today for further evaluation of chronic pain involving multiple joints.  Her arthralgias initially started about 5 years ago.  Her symptoms were  previously adequately controlled while taking meloxicam.  She had discontinued meloxicam 2 months ago due to developing a gastric ulcer.  She has been taking omeprazole twice daily  as prescribed and is scheduled for an EEG in 1 week to assess for healing.   The patient was switched from meloxicam to tramadol for pain relief.  She could not tolerate tramadol due to headaches and nausea.  She then tried taking Tylenol arthritis but discontinued due to ongoing GI upset.  On examination today no synovitis or dactylitis was noted.  Plan to update x-rays and the following lab work for further evaluation.  A referral to pain management will also be placed to discuss future treatment options for pain relief.   Plan: TSH, Sedimentation rate, C-reactive protein, HLA-B27 antigen, Uric acid, Rheumatoid factor, Cyclic citrul peptide antibody, IgG  Pain in both hands -DIP thickening noted on exam.  No synovitis was apparent.  Complete fist formation bilaterally.  She has noticed intermittent joint swelling and stiffness since discontinuing meloxicam 2 months ago.  X-rays of both hands updated today.  The following lab work will be obtained today for further evaluation. Referral to pain management placed today.  Plan: XR Hand 2 View Right, XR Hand 2 View Left  Chronic pain of both knees -Under care of [facility].  Previously underwent cortisone injections in both knees-several years ago.  Patient has good ROM of both knee joints.  No warmth or effusion noted on examination today.  X-rays of both knees updated today for further evaluation as requested.  Plan: XR KNEE 3 VIEW RIGHT, XR KNEE 3 VIEW LEFT  Pain in both feet - Ankle joints have good ROM with no tenderness or synovitis.  No evidence of achilles tendonitis.  Tenderness along the plantar fascia of the right foot.  Mild dorsal spurs noted bilaterally.  Thickening of both 1st MTP joints.  Tenderness of the bilateral 3rd and 4th MTP joints. No synovitis  or dactylitis noted. X-rays of both feet updated today. Plan: XR Foot 2 Views Right, XR Foot 2 Views Left  Chronic midline low back pain with left-sided sciatica -Under care of Dr. Maurice Small.  Injection performed in the past.  Currently going to PT 2-3 times per week and performing home exercises.  Her lower back pain was exacerbated 3 weeks ago while performing home exercises.  She has had difficulty sleeping at night due to nocturnal pain.  Overall her lower back pain has worsened since having to discontinue meloxicam 2 months ago.  She has midline spinal tenderness in the lumbar region today.  She is having some radiating pain to the left side.  X-rays of the lumbar spine were updated today along with the following lab work.  Plan to also refer the patient to pain management as requested.  Plan: XR Lumbar Spine 2-3 Views, Sedimentation rate, C-reactive protein, HLA-B27 antigen  Status post total hip replacement, left: Performed by Dr. Eulah Pont May 2023.  Doing well.  Good ROM with no groin pain currently.  Patient is having radiating pain to the left side which seems to be coming from her lower back.  She has been going to physical therapy which initially was improving her symptoms but she has had an exacerbation of symptoms over the past 3 weeks after performing home exercises.  Plan to place a referral to pain management as discussed above.  Plantar fasciitis of right foot - Tenderness along the plantar fascia.  No history of Achilles tendinitis. No personal history of Crohn's disease, ulcerative colitis, uveitis, or psoriasis. Family history of Crohn's-brother Plan to check HLA-B27 antigen today.  plan: HLA-B27 antigen  Myalgia -Patient has been experiencing total  body pain since discontinuing meloxicam.  CK and TSH will be updated today.   Plan: CK, TSH  Family history of fibromyalgia: Mother   Family history of Crohn's disease: Brother   Other fatigue - The following labs will be updated today  for further evaluation.  Plan: CK, TSH, Sedimentation rate, C-reactive protein  Other medical conditions are listed as follows:   Coronary artery calcification  Aortic atherosclerosis (HCC)  Centrilobular emphysema (HCC) - Asthma overlap  Carcinoid bronchial adenoma, right (HCC) - status post right middle lobectomy with lymph node dissection on December 30, 2018 under the care of Dr. Tyrone Sage. Chest CT every 6 months.  S/P lobectomy of lung  Malignant carcinoid tumor of rectum [facility]) - Surgical resection 2019  Gastroesophageal reflux disease with esophagitis without hemorrhage  Vitamin D insufficiency  Mixed hyperlipidemia  Anxiety  Essential hypertension: BP was 111/71 today in the office.     Orders: Orders Placed This Encounter  Procedures   XR KNEE 3 VIEW RIGHT   XR KNEE 3 VIEW LEFT   XR Hand 2 View Right   XR Hand 2 View Left   XR Lumbar Spine 2-3 Views   XR Foot 2 Views Right   XR Foot 2 Views Left   CK   TSH   Sedimentation rate   C-reactive protein   HLA-B27 antigen   Uric acid   Rheumatoid factor   Cyclic citrul peptide antibody, IgG   No orders of the defined types were placed in this encounter.     Follow-Up Instructions: Return for NPFU.   Gearldine Bienenstock, PA-C  Note - This record has been created using Dragon software.  Chart creation errors have been sought, but may not always  have been located. Such creation errors do not reflect on  the standard of medical care.

## 2023-03-23 ENCOUNTER — Encounter: Payer: Self-pay | Admitting: Physician Assistant

## 2023-03-23 ENCOUNTER — Ambulatory Visit (INDEPENDENT_AMBULATORY_CARE_PROVIDER_SITE_OTHER): Payer: BC Managed Care – PPO

## 2023-03-23 ENCOUNTER — Ambulatory Visit: Payer: BC Managed Care – PPO

## 2023-03-23 ENCOUNTER — Ambulatory Visit: Payer: BC Managed Care – PPO | Attending: Physician Assistant | Admitting: Physician Assistant

## 2023-03-23 VITALS — BP 111/71 | HR 77 | Resp 16 | Ht 65.0 in | Wt 179.6 lb

## 2023-03-23 DIAGNOSIS — C7A090 Malignant carcinoid tumor of the bronchus and lung: Secondary | ICD-10-CM

## 2023-03-23 DIAGNOSIS — I2510 Atherosclerotic heart disease of native coronary artery without angina pectoris: Secondary | ICD-10-CM | POA: Diagnosis not present

## 2023-03-23 DIAGNOSIS — M79672 Pain in left foot: Secondary | ICD-10-CM

## 2023-03-23 DIAGNOSIS — Z96642 Presence of left artificial hip joint: Secondary | ICD-10-CM

## 2023-03-23 DIAGNOSIS — G8929 Other chronic pain: Secondary | ICD-10-CM

## 2023-03-23 DIAGNOSIS — M79642 Pain in left hand: Secondary | ICD-10-CM | POA: Diagnosis not present

## 2023-03-23 DIAGNOSIS — M5442 Lumbago with sciatica, left side: Secondary | ICD-10-CM | POA: Diagnosis not present

## 2023-03-23 DIAGNOSIS — I10 Essential (primary) hypertension: Secondary | ICD-10-CM

## 2023-03-23 DIAGNOSIS — M25561 Pain in right knee: Secondary | ICD-10-CM

## 2023-03-23 DIAGNOSIS — M160 Bilateral primary osteoarthritis of hip: Secondary | ICD-10-CM

## 2023-03-23 DIAGNOSIS — F419 Anxiety disorder, unspecified: Secondary | ICD-10-CM

## 2023-03-23 DIAGNOSIS — I2584 Coronary atherosclerosis due to calcified coronary lesion: Secondary | ICD-10-CM

## 2023-03-23 DIAGNOSIS — I700 Atherosclerosis of aorta: Secondary | ICD-10-CM

## 2023-03-23 DIAGNOSIS — M79671 Pain in right foot: Secondary | ICD-10-CM | POA: Diagnosis not present

## 2023-03-23 DIAGNOSIS — M25562 Pain in left knee: Secondary | ICD-10-CM

## 2023-03-23 DIAGNOSIS — K2100 Gastro-esophageal reflux disease with esophagitis, without bleeding: Secondary | ICD-10-CM

## 2023-03-23 DIAGNOSIS — C7A026 Malignant carcinoid tumor of the rectum: Secondary | ICD-10-CM

## 2023-03-23 DIAGNOSIS — M791 Myalgia, unspecified site: Secondary | ICD-10-CM

## 2023-03-23 DIAGNOSIS — M79641 Pain in right hand: Secondary | ICD-10-CM

## 2023-03-23 DIAGNOSIS — J432 Centrilobular emphysema: Secondary | ICD-10-CM | POA: Diagnosis not present

## 2023-03-23 DIAGNOSIS — Z8379 Family history of other diseases of the digestive system: Secondary | ICD-10-CM

## 2023-03-23 DIAGNOSIS — E559 Vitamin D deficiency, unspecified: Secondary | ICD-10-CM

## 2023-03-23 DIAGNOSIS — R5383 Other fatigue: Secondary | ICD-10-CM

## 2023-03-23 DIAGNOSIS — E782 Mixed hyperlipidemia: Secondary | ICD-10-CM

## 2023-03-23 DIAGNOSIS — Z8269 Family history of other diseases of the musculoskeletal system and connective tissue: Secondary | ICD-10-CM

## 2023-03-23 DIAGNOSIS — M2550 Pain in unspecified joint: Secondary | ICD-10-CM

## 2023-03-23 DIAGNOSIS — Z902 Acquired absence of lung [part of]: Secondary | ICD-10-CM

## 2023-03-23 DIAGNOSIS — M722 Plantar fascial fibromatosis: Secondary | ICD-10-CM

## 2023-03-23 NOTE — Addendum Note (Signed)
Addended by: Audrie Lia on: 03/23/2023 03:43 PM   Modules accepted: Orders

## 2023-03-24 ENCOUNTER — Other Ambulatory Visit: Payer: Self-pay | Admitting: Physician Assistant

## 2023-03-24 DIAGNOSIS — N958 Other specified menopausal and perimenopausal disorders: Secondary | ICD-10-CM

## 2023-03-25 LAB — CYCLIC CITRUL PEPTIDE ANTIBODY, IGG: Cyclic Citrullin Peptide Ab: <16 U

## 2023-03-25 LAB — HLA-B27 ANTIGEN: HLA-B27 Antigen: NEGATIVE

## 2023-03-25 LAB — URIC ACID: Uric Acid, Serum: 4.9 mg/dL (ref 2.5–7.0)

## 2023-03-25 LAB — CK: Total CK: 103 U/L (ref 29–143)

## 2023-03-25 LAB — RHEUMATOID FACTOR: Rheumatoid fact SerPl-aCnc: <10 [IU]/mL (ref <14)

## 2023-03-25 LAB — TSH: TSH: 1.3 m[IU]/L (ref 0.40–4.50)

## 2023-03-25 LAB — SEDIMENTATION RATE: Sed Rate: 6 mm/h (ref 0–30)

## 2023-03-25 LAB — C-REACTIVE PROTEIN: CRP: <3 mg/L (ref <8.0)

## 2023-03-25 NOTE — Progress Notes (Signed)
Labs will be discussed at NPFU

## 2023-03-30 ENCOUNTER — Ambulatory Visit (AMBULATORY_SURGERY_CENTER): Payer: BC Managed Care – PPO | Admitting: Internal Medicine

## 2023-03-30 ENCOUNTER — Encounter: Payer: Self-pay | Admitting: Internal Medicine

## 2023-03-30 VITALS — BP 120/65 | HR 67 | Temp 97.5°F | Resp 16 | Ht 65.5 in | Wt 181.0 lb

## 2023-03-30 DIAGNOSIS — K219 Gastro-esophageal reflux disease without esophagitis: Secondary | ICD-10-CM

## 2023-03-30 DIAGNOSIS — R1013 Epigastric pain: Secondary | ICD-10-CM

## 2023-03-30 HISTORY — PX: ESOPHAGOGASTRODUODENOSCOPY: SHX1529

## 2023-03-30 MED ORDER — SODIUM CHLORIDE 0.9 % IV SOLN: 500.0000 mL | Freq: Once | INTRAVENOUS | Status: DC

## 2023-03-30 NOTE — Progress Notes (Signed)
Sedate, gd SR, tolerated procedure well, VSS, report to RN 

## 2023-03-30 NOTE — Progress Notes (Signed)
Pt's states no medical or surgical changes since previsit or office visit. VS assessed by K.D

## 2023-03-30 NOTE — Op Note (Signed)
[facility] Patient Name: Shannon [name] Procedure Date: 03/30/2023 9:27 AM MRN: [id number] Endoscopist: Wilhemina Bonito. Marina Goodell , MD, [id number] Age: 62 Referring MD:  Date of Birth: April 15, 1961 Gender: Female Account #: [id number] Procedure:                Upper GI endoscopy Indications:              Dyspepsia, Esophageal reflux, follow up prior EGD                            findings Medicines:                Monitored Anesthesia Care Procedure:                Pre-Anesthesia Assessment:                           - Prior to the procedure, a History and Physical                            was performed, and patient medications and                            allergies were reviewed. The patient's tolerance of                            previous anesthesia was also reviewed. The risks                            and benefits of the procedure and the sedation                            options and risks were discussed with the patient.                            All questions were answered, and informed consent                            was obtained. Prior Anticoagulants: The patient has                            taken no anticoagulant or antiplatelet agents. ASA                            Grade Assessment: II - A patient with mild systemic                            disease. After reviewing the risks and benefits,                            the patient was deemed in satisfactory condition to                            undergo the procedure.  After obtaining informed consent, the endoscope was                            passed under direct vision. Throughout the                            procedure, the patient's blood pressure, pulse, and                            oxygen saturations were monitored continuously. The                            GIF [id number] #[id number] was introduced through the                            mouth, and advanced to the second part of  duodenum.                            The upper GI endoscopy was accomplished without                            difficulty. The patient tolerated the procedure                            well. Scope In: Scope Out: Findings:                 The esophagus was normal.                           The stomach was normal.                           The examined duodenum was normal.                           The cardia and gastric fundus were normal on                            retroflexion. Complications:            No immediate complications. Estimated Blood Loss:     Estimated blood loss: none. Impression:               - Normal esophagus.                           - Normal stomach.                           - Normal examined duodenum.                           - No specimens collected. Recommendation:           - Patient has a contact number available for  emergencies. The signs and symptoms of potential                            delayed complications were discussed with the                            patient. Return to normal activities tomorrow.                            Written discharge instructions were provided to the                            patient.                           - Resume previous diet.                           - Continue present medications. Wilhemina Bonito. Marina Goodell, MD 03/30/2023 9:45:49 AM This report has been signed electronically.

## 2023-03-30 NOTE — Progress Notes (Signed)
HISTORY OF PRESENT ILLNESS:  Shannon [name] is a 62 y.o. female who underwent upper endoscopy earlier this year.  Had small ulcerated area of the stomach.  Biopsies without significant pathology.  Duodenal nodule biopsied and found to be greater gland hyperplasia.  Has been having some dyspepsia recently.  She is on PPI therapy which helps her GERD.  Now for relook endoscopy  REVIEW OF SYSTEMS:  All non-GI ROS negative except for  Past Medical History:  Diagnosis Date   Anemia    takes iron supplements, Pt states anemia is thought to be r/t a bleeding uterine polyp.   Anxiety    Follows w/ PCP, Dr. Harless Nakayama.   Arthritis    back, hips, shoulders, hands, knees   Asthma    Follows w/ pulmonology, Dr. [name].   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 [name].   Gastritis    Found on 10/2022 EGD. Following w/ Dr. Marina Goodell, GI.   GERD (gastroesophageal reflux disease)    History of kidney stones    passed   HLD (hyperlipidemia)    Follows w/ PCP, Dr. Harless Nakayama.   Hypertension    Follows w/ PCP, Benita Stabile @ [facility].   Pneumonia 02/2018   Rectal tumor    neuroendocrine carcinoma of rectum, follows w/ GI , Dr. Sherre Lain and Dr. Shirline Frees at [facility].   Seasonal allergies    Wears glasses     Past Surgical History:  Procedure Laterality Date   ABLATION     endometrial   BIOPSY  10/10/2018   Procedure: BIOPSY;  Surgeon: Meridee Score Netty Starring., MD;  Location: [facility] ENDOSCOPY;  Service: Gastroenterology;;   BIOPSY  06/12/2019   Procedure: BIOPSY;  Surgeon: Lemar Lofty., MD;  Location: [facility] ENDOSCOPY;  Service: Gastroenterology;;   BIOPSY  09/30/2020   Procedure: BIOPSY;  Surgeon: Lemar Lofty., MD;  Location: [facility] ENDOSCOPY;  Service: Gastroenterology;;   BIOPSY  11/18/2022    Procedure: BIOPSY;  Surgeon: Lemar Lofty., MD;  Location: [facility] ENDOSCOPY;  Service: Gastroenterology;;   COLONOSCOPY     COLONOSCOPY WITH PROPOFOL N/A 11/18/2022   Procedure: COLONOSCOPY WITH PROPOFOL;  Surgeon: Lemar Lofty., MD;  Location: [facility] ENDOSCOPY;  Service: Gastroenterology;  Laterality: N/A;   DOPPLER ECHOCARDIOGRAPHY     ENDOSCOPIC MUCOSAL RESECTION N/A 10/10/2018   Procedure: ENDOSCOPIC MUCOSAL RESECTION;  Surgeon: Meridee Score, Netty Starring., MD;  Location: [facility] ENDOSCOPY;  Service: Gastroenterology;  Laterality: N/A;   ESOPHAGOGASTRODUODENOSCOPY N/A 11/18/2022   Procedure: ESOPHAGOGASTRODUODENOSCOPY (EGD);  Surgeon: Lemar Lofty., MD;  Location: [facility] ENDOSCOPY;  Service: Gastroenterology;  Laterality: N/A;   EUS N/A 07/18/2018   Procedure: LOWER ENDOSCOPIC ULTRASOUND (EUS);  Surgeon: Lemar Lofty., MD;  Location: [facility] ENDOSCOPY;  Service: Gastroenterology;  Laterality: N/A;   EUS N/A 10/10/2018   Procedure: LOWER ENDOSCOPIC ULTRASOUND (EUS);  Surgeon: Lemar Lofty., MD;  Location: [facility] ENDOSCOPY;  Service: Gastroenterology;  Laterality: N/A;   EUS N/A 06/12/2019   Procedure: LOWER ENDOSCOPIC ULTRASOUND (EUS);  Surgeon: Lemar Lofty., MD;  Location: [facility] ENDOSCOPY;  Service: Gastroenterology;  Laterality: N/A;   EUS N/A 09/30/2020   Procedure: LOWER ENDOSCOPIC ULTRASOUND (EUS);  Surgeon: Lemar Lofty., MD;  Location: [facility] ENDOSCOPY;  Service: Gastroenterology;  Laterality: N/A;  EUS N/A 11/18/2022   Procedure: LOWER ENDOSCOPIC ULTRASOUND (EUS);  Surgeon: Lemar Lofty., MD;  Location: [facility] ENDOSCOPY;  Service: Gastroenterology;  Laterality: N/A;   FLEXIBLE SIGMOIDOSCOPY N/A 07/18/2018   Procedure: FLEXIBLE SIGMOIDOSCOPY;  Surgeon: Meridee Score Netty Starring., MD;  Location: [facility] ENDOSCOPY;  Service: Gastroenterology;  Laterality: N/A;   FLEXIBLE SIGMOIDOSCOPY N/A 10/10/2018   Procedure: FLEXIBLE SIGMOIDOSCOPY;  Surgeon: Meridee Score  Netty Starring., MD;  Location: [facility] ENDOSCOPY;  Service: Gastroenterology;  Laterality: N/A;   FLEXIBLE SIGMOIDOSCOPY N/A 06/12/2019   Procedure: FLEXIBLE SIGMOIDOSCOPY;  Surgeon: Meridee Score Netty Starring., MD;  Location: [facility] ENDOSCOPY;  Service: Gastroenterology;  Laterality: N/A;   FLEXIBLE SIGMOIDOSCOPY N/A 09/30/2020   Procedure: FLEXIBLE SIGMOIDOSCOPY;  Surgeon: Meridee Score Netty Starring., MD;  Location: [facility] ENDOSCOPY;  Service: Gastroenterology;  Laterality: N/A;   HEMOSTASIS CLIP PLACEMENT  10/10/2018   Procedure: HEMOSTASIS CLIP PLACEMENT;  Surgeon: Lemar Lofty., MD;  Location: [facility] ENDOSCOPY;  Service: Gastroenterology;;   LUNG CANCER SURGERY  12/30/2018   right lun- mid lobe , dr gerhardt   POLYPECTOMY  11/18/2022   Procedure: POLYPECTOMY;  Surgeon: Lemar Lofty., MD;  Location: [facility] ENDOSCOPY;  Service: Gastroenterology;;   Gaspar Bidding DILATION N/A 11/18/2022   Procedure: Jacklyn Shell;  Surgeon: Lemar Lofty., MD;  Location: [facility] ENDOSCOPY;  Service: Gastroenterology;  Laterality: N/A;   SUBMUCOSAL LIFTING INJECTION  10/10/2018   Procedure: SUBMUCOSAL LIFTING INJECTION;  Surgeon: Meridee Score Netty Starring., MD;  Location: [facility] ENDOSCOPY;  Service: Gastroenterology;;   TEE WITHOUT CARDIOVERSION  02/2018   TONSILLECTOMY     TOTAL HIP ARTHROPLASTY Left 11/2021   TUBAL LIGATION  1987   VIDEO ASSISTED THORACOSCOPY (VATS)/WEDGE RESECTION Right 12/30/2018   Procedure: VIDEO ASSISTED THORACOSCOPY (VATS), MINI THORACOTOMY, RIGHT MIDDLE LOBECTOMY WITH LYMPH NODE DISSECTION;  Surgeon: Delight Ovens, MD;  Location: [facility] OR;  Service: Thoracic;  Laterality: Right;   VIDEO BRONCHOSCOPY N/A 10/12/2018   Procedure: VIDEO BRONCHOSCOPY;  Surgeon: Delight Ovens, MD;  Location: [facility] OR;  Service: Thoracic;  Laterality: N/A;   VIDEO BRONCHOSCOPY N/A 12/30/2018   Procedure: VIDEO BRONCHOSCOPY;  Surgeon: Delight Ovens, MD;  Location: [facility] OR;  Service: Thoracic;  Laterality: N/A;   WISDOM  TOOTH EXTRACTION      Social History AZLEE BOESEN  reports that she quit smoking about 16 years ago. Her smoking use included cigarettes. She started smoking about 49 years ago. She has a 33 pack-year smoking history. She has been exposed to tobacco smoke. She has never used smokeless tobacco. She reports that she does not currently use alcohol. She reports that she does not use drugs.  family history includes Arthritis in her mother; COPD in her mother; Diabetes in her father; Emphysema in her mother; Fibromyalgia in her mother; Healthy in her daughter and son; Heart disease in her mother.  Allergies  Allergen Reactions   Hydrocodone Nausea Only and Other (See Comments)    "terrible headache"   Pravastatin Other (See Comments)    Muscle/joint pain   Levaquin [Levofloxacin In D5w] Nausea And Vomiting        Other      Dog Dander        PHYSICAL EXAMINATION: Vital signs: BP (!) 125/57   Pulse 67   Temp (!) 97.5 F (36.4 C) (Skin)   Resp 11   Ht 5' 5.5" (1.664 m)   Wt 181 lb (82.1 kg)   LMP  (LMP Unknown)   SpO2 (!) 83%   BMI 29.66 kg/m  General: Well-developed,  well-nourished, no acute distress HEENT: Sclerae are anicteric, conjunctiva pink. Oral mucosa intact Lungs: Clear Heart: Regular Abdomen: soft, nontender, nondistended, no obvious ascites, no peritoneal signs, normal bowel sounds. No organomegaly. Extremities: No edema Psychiatric: alert and oriented x3. Cooperative     ASSESSMENT:  1.  History of erosive gastritis and duodenal nodule 2.  Dyspepsia  PLAN:  EGD

## 2023-03-30 NOTE — Patient Instructions (Signed)
-  Continue present medications   YOU HAD AN ENDOSCOPIC PROCEDURE TODAY AT THE [facility]:   Refer to the procedure report that was given to you for any specific questions about what was found during the examination.  If the procedure report does not answer your questions, please call your gastroenterologist to clarify.  If you requested that your care partner not be given the details of your procedure findings, then the procedure report has been included in a sealed envelope for you to review at your convenience later.  YOU SHOULD EXPECT: Some feelings of bloating in the abdomen. Passage of more gas than usual.  Walking can help get rid of the air that was put into your GI tract during the procedure and reduce the bloating. If you had a lower endoscopy (such as a colonoscopy or flexible sigmoidoscopy) you may notice spotting of blood in your stool or on the toilet paper. If you underwent a bowel prep for your procedure, you may not have a normal bowel movement for a few days.  Please Note:  You might notice some irritation and congestion in your nose or some drainage.  This is from the oxygen used during your procedure.  There is no need for concern and it should clear up in a day or so.  SYMPTOMS TO REPORT IMMEDIATELY:  Following upper endoscopy (EGD)  Vomiting of blood or coffee ground material  New chest pain or pain under the shoulder blades  Painful or persistently difficult swallowing  New shortness of breath  Fever of 100F or higher  Black, tarry-looking stools  For urgent or emergent issues, a gastroenterologist can be reached at any hour by calling [phone]. Do not use MyChart messaging for urgent concerns.    DIET:  We do recommend a small meal at first, but then you may proceed to your regular diet.  Drink plenty of fluids but you should avoid alcoholic beverages for 24 hours.  ACTIVITY:  You should plan to take it easy for the rest of today and you should NOT  DRIVE or use heavy machinery until tomorrow (because of the sedation medicines used during the test).    FOLLOW UP: Our staff will call the number listed on your records the next business day following your procedure.  We will call around 7:15- 8:00 am to check on you and address any questions or concerns that you may have regarding the information given to you following your procedure. If we do not reach you, we will leave a message.     If any biopsies were taken you will be contacted by phone or by letter within the next 1-3 weeks.  Please call us at [phone] if you have not heard about the biopsies in 3 weeks.    SIGNATURES/CONFIDENTIALITY: You and/or your care partner have signed paperwork which will be entered into your electronic medical record.  These signatures attest to the fact that that the information above on your After Visit Summary has been reviewed and is understood.  Full responsibility of the confidentiality of this discharge information lies with you and/or your care-partner.

## 2023-03-31 ENCOUNTER — Telehealth: Payer: Self-pay

## 2023-03-31 NOTE — Telephone Encounter (Signed)
  Follow up Call-     03/30/2023    7:57 AM  Call back number  Post procedure Call Back phone  # [phone]  Permission to leave phone message Yes     Patient questions:  Do you have a fever, pain , or abdominal swelling? No. Pain Score  0 *  Have you tolerated food without any problems? Yes.    Have you been able to return to your normal activities? Yes.    Do you have any questions about your discharge instructions: Diet   No. Medications  No. Follow up visit  No.  Do you have questions or concerns about your Care? No.  Actions: * If pain score is 4 or above: No action needed, pain <4.

## 2023-04-01 ENCOUNTER — Encounter: Payer: Self-pay | Admitting: Physical Medicine and Rehabilitation

## 2023-04-02 ENCOUNTER — Telehealth: Payer: Self-pay | Admitting: Physician Assistant

## 2023-04-02 ENCOUNTER — Encounter: Payer: Self-pay | Admitting: Internal Medicine

## 2023-04-02 NOTE — Telephone Encounter (Signed)
PER PCP SALLY DAVIS, PA PT NEEDS TO SCHEDULE A PRE - OP CLEARANCE APPT. I TRIED CALLING PT NO ANSWER. I LEFT VOICEMAIL FOR PT TO CALL THE OFFICE TO MAKE AN APPT.

## 2023-04-05 ENCOUNTER — Other Ambulatory Visit: Payer: Self-pay | Admitting: Medical Genetics

## 2023-04-05 DIAGNOSIS — Z006 Encounter for examination for normal comparison and control in clinical research program: Secondary | ICD-10-CM

## 2023-04-06 ENCOUNTER — Other Ambulatory Visit: Payer: Self-pay | Admitting: Physician Assistant

## 2023-04-06 DIAGNOSIS — I10 Essential (primary) hypertension: Secondary | ICD-10-CM

## 2023-04-11 ENCOUNTER — Other Ambulatory Visit: Payer: Self-pay | Admitting: Physician Assistant

## 2023-04-11 DIAGNOSIS — E782 Mixed hyperlipidemia: Secondary | ICD-10-CM

## 2023-04-13 ENCOUNTER — Encounter (HOSPITAL_BASED_OUTPATIENT_CLINIC_OR_DEPARTMENT_OTHER): Payer: Self-pay | Admitting: Obstetrics and Gynecology

## 2023-04-13 ENCOUNTER — Other Ambulatory Visit: Payer: Self-pay

## 2023-04-13 NOTE — Progress Notes (Addendum)
Spoke w/ via phone for pre-op interview---Shannon [name] needs dos----none               [name] results------04/26/23 [name] appt for cbc, cmp, type & screen, 03/11/2023 EKG in Epic & chart, 2019 Echo in Epic  EF 55 - 60%, 2019 Exercise Tolerance Test in Epic- normal COVID test -----patient states asymptomatic no test needed Arrive at -------0530 on Thursday, 04/29/23 NPO after MN NO Solid Food.  Clear liquids from MN until---0430 Med rec completed Medications to take morning of surgery -----Albuterol inhaler, Stiolto Respimat inhaler, Buspar, Flexeril prn, Omeprazole, Tramadol prn Diabetic medication -----n/a Patient instructed no nail polish to be worn day of surgery Patient instructed to bring photo id and insurance card day of surgery Patient aware to have Driver (ride ) / caregiver    for 24 hours after surgery - husband, [name] Patient Special Instructions -----Extended / overnight stay instructions given. Bring albuterol inhaler on day of surgery. Pre-Op special Instructions -----none Patient verbalized understanding of instructions that were given at this phone interview. Patient denies shortness of breath, chest pain, fever, cough at this phone interview.  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 She follows with Dr. [name] at [facility], [facility] 02/09/2023. 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 She was given pulmonology clearance with recommendations for anesthesia, including pre and post-op incentive spirometry and the avoidance of the use of pancuronium during anesthesia. I reviewed this case with Dr. Richardson Landry, MDA on 02/10/23. Per Dr. Richardson Landry, it is okay to proceed with surgery at [facility]. Copy of pulmonary clearance was placed in chart.  See 03/01/23 Progress note by [name], RN. Patient received cardiac clearance from Dr. [name], cardiologist dated 03/11/23  in Epic & chart. Patient's follow-up appointment with [name] dated 04/19/2023 placed in chart.

## 2023-04-13 NOTE — Progress Notes (Signed)
Your procedure is scheduled on Thursday, 04/29/2023.  Report to [facility] AT  5:30 AM.   Call this number if you have problems the morning of surgery  [phone].   OUR ADDRESS IS [address].  WE ARE LOCATED IN [facility].  PLEASE BRING YOUR INSURANCE CARD AND PHOTO ID DAY OF SURGERY.  ONLY 2 PEOPLE ARE ALLOWED IN  WAITING  ROOM                                      REMEMBER:  DO NOT EAT FOOD, CANDY GUM OR MINTS  AFTER MIDNIGHT THE NIGHT BEFORE YOUR SURGERY . YOU MAY HAVE CLEAR LIQUIDS FROM MIDNIGHT THE NIGHT BEFORE YOUR SURGERY UNTIL  4:30 AM. NO CLEAR LIQUIDS AFTER   4:30 AM DAY OF SURGERY.  YOU MAY  BRUSH YOUR TEETH MORNING OF SURGERY AND RINSE YOUR MOUTH OUT, NO CHEWING GUM CANDY OR MINTS.     CLEAR LIQUID DIET    Allowed      Water                                                                   Coffee and tea, regular and decaf  (NO cream or milk products of any type, may sweeten)                         Carbonated beverages, regular and diet                                    Sports drinks like Gatorade _____________________________________________________________________     TAKE ONLY THESE MEDICATIONS MORNING OF SURGERY: Albuterol inhaler (please bring with you on the day of surgery), Stiolto Respimat inhaler, Buspar, Flexeril if needed, Omeprazole, Tramadol if needed                                        DO NOT WEAR JEWERLY/  METAL/  PIERCINGS (INCLUDING NO PLASTIC PIERCINGS) DO NOT WEAR LOTIONS, POWDERS, PERFUMES OR NAIL POLISH ON YOUR FINGERNAILS. TOENAIL POLISH IS OK TO WEAR. DO NOT SHAVE FOR 48 HOURS PRIOR TO DAY OF SURGERY.  CONTACTS, GLASSES, OR DENTURES MAY NOT BE WORN TO SURGERY.  REMEMBER: NO SMOKING, VAPING ,  DRUGS OR ALCOHOL FOR 24 HOURS BEFORE YOUR SURGERY.                                    [place] IS NOT RESPONSIBLE  FOR ANY BELONGINGS.                                                                     Marland Kitchen   - Preparing for Surgery Before surgery, you can play an important role.  Because skin is not sterile, your skin needs to be as free of germs as possible.  You can reduce the number of germs on your skin by washing with CHG (chlorahexidine gluconate) soap before surgery.  CHG is an antiseptic cleaner which kills germs and bonds with the skin to continue killing germs even after washing. Please DO NOT use if you have an allergy to CHG or antibacterial soaps.  If your skin becomes reddened/irritated stop using the CHG and inform your nurse when you arrive at Short Stay. Do not shave (including legs and underarms) for at least 48 hours prior to the first CHG shower.  You may shave your face/neck. Please follow these instructions carefully:  1.  Shower with CHG Soap the night before surgery and the  morning of Surgery.  2.  If you choose to wash your hair, wash your hair first as usual with your  normal  shampoo.  3.  After you shampoo, rinse your hair and body thoroughly to remove the  shampoo.                                        4.  Use CHG as you would any other liquid soap.  You can apply chg directly  to the skin and wash , chg soap provided, night before and morning of your surgery.  5.  Apply the CHG Soap to your body ONLY FROM THE NECK DOWN.   Do not use on face/ open                           Wound or open sores. Avoid contact with eyes, ears mouth and genitals (private parts).                       Wash face,  Genitals (private parts) with your normal soap.             6.  Wash thoroughly, paying special attention to the area where your surgery  will be performed.  7.  Thoroughly rinse your body with warm water from the neck down.  8.  DO NOT shower/wash with your normal soap after using and rinsing off  the CHG Soap.             9.  Pat yourself dry with a clean towel.            10.  Wear clean pajamas.            11.  Place clean sheets on your bed  the night of your first shower and do not  sleep with pets. Day of Surgery : Do not apply any lotions/ powders the morning of surgery.  Please wear clean clothes to the hospital/surgery center.  IF YOU HAVE ANY SKIN IRRITATION OR PROBLEMS WITH THE SURGICAL SOAP, PLEASE GET A BAR OF GOLD DIAL SOAP AND SHOWER THE NIGHT BEFORE YOUR SURGERY AND THE MORNING OF YOUR SURGERY. PLEASE LET THE NURSE KNOW MORNING OF YOUR SURGERY IF YOU HAD ANY PROBLEMS WITH THE SURGICAL SOAP.   YOUR SURGEON MAY HAVE REQUESTED EXTENDED RECOVERY TIME AFTER YOUR SURGERY. IT COULD BE A  JUST A FEW HOURS  UP TO AN OVERNIGHT STAY.  YOUR SURGEON SHOULD HAVE DISCUSSED  THIS WITH YOU PRIOR TO YOUR SURGERY. IN THE EVENT YOU NEED TO STAY OVERNIGHT PLEASE REFER TO THE FOLLOWING GUIDELINES. YOU MAY HAVE UP TO 4 VISITORS  MAY VISIT IN THE EXTENDED RECOVERY ROOM UNTIL 800 PM ONLY.  ONE  VISITOR AGE 39 AND OVER MAY SPEND THE NIGHT AND MUST BE IN EXTENDED RECOVERY ROOM NO LATER THAN 800 PM . YOUR DISCHARGE TIME AFTER YOU SPEND THE NIGHT IS 900 AM THE MORNING AFTER YOUR SURGERY. YOU MAY PACK A SMALL OVERNIGHT BAG WITH TOILETRIES FOR YOUR OVERNIGHT STAY IF YOU WISH.  REGARDLESS OF IF YOU STAY OVER NIGHT OR ARE DISCHARGED THE SAME DAY YOU WILL BE REQUIRED TO HAVE A RESPONSIBLE ADULT (18 YRS OLD OR OLDER) STAY WITH YOU FOR AT LEAST THE FIRST 24 HOURS  YOUR PRESCRIPTION MEDICATIONS WILL BE PROVIDED DURING YOUR HOSPITAL STAY.  ________________________________________________________________________                                                        QUESTIONS Mechele Claude PRE OP NURSE PHONE [phone].

## 2023-04-15 ENCOUNTER — Other Ambulatory Visit: Payer: Self-pay | Admitting: Physician Assistant

## 2023-04-15 DIAGNOSIS — M160 Bilateral primary osteoarthritis of hip: Secondary | ICD-10-CM

## 2023-04-19 ENCOUNTER — Encounter: Payer: Self-pay | Admitting: Physician Assistant

## 2023-04-19 ENCOUNTER — Ambulatory Visit (INDEPENDENT_AMBULATORY_CARE_PROVIDER_SITE_OTHER): Payer: BC Managed Care – PPO | Admitting: Physician Assistant

## 2023-04-19 VITALS — BP 120/70 | HR 68 | Temp 97.0°F | Ht 65.5 in | Wt 181.8 lb

## 2023-04-19 DIAGNOSIS — N840 Polyp of corpus uteri: Secondary | ICD-10-CM | POA: Insufficient documentation

## 2023-04-19 DIAGNOSIS — D649 Anemia, unspecified: Secondary | ICD-10-CM | POA: Insufficient documentation

## 2023-04-19 DIAGNOSIS — D508 Other iron deficiency anemias: Secondary | ICD-10-CM | POA: Diagnosis not present

## 2023-04-19 DIAGNOSIS — I10 Essential (primary) hypertension: Secondary | ICD-10-CM | POA: Diagnosis not present

## 2023-04-19 LAB — COMPREHENSIVE METABOLIC PANEL
ALT: 15 IU/L (ref 0–32)
AST: 20 IU/L (ref 0–40)
Albumin: 4.3 g/dL (ref 3.9–4.9)
Alkaline Phosphatase: 77 IU/L (ref 44–121)
BUN/Creatinine Ratio: 25 (ref 12–28)
BUN: 21 mg/dL (ref 8–27)
Bilirubin Total: 0.3 mg/dL (ref 0.0–1.2)
CO2: 25 mmol/L (ref 20–29)
Calcium: 10.4 mg/dL — ABNORMAL HIGH (ref 8.7–10.3)
Chloride: 104 mmol/L (ref 96–106)
Creatinine, Ser: 0.84 mg/dL (ref 0.57–1.00)
Globulin, Total: 2.2 g/dL (ref 1.5–4.5)
Glucose: 82 mg/dL (ref 70–99)
Potassium: 5.6 mmol/L — ABNORMAL HIGH (ref 3.5–5.2)
Sodium: 142 mmol/L (ref 134–144)
Total Protein: 6.5 g/dL (ref 6.0–8.5)
eGFR: 79 mL/min/{1.73_m2} (ref >59)

## 2023-04-19 LAB — CBC WITH DIFFERENTIAL/PLATELET
Basophils Absolute: 0 10*3/uL (ref 0.0–0.2)
Basos: 1 %
EOS (ABSOLUTE): 0.3 10*3/uL (ref 0.0–0.4)
Eos: 4 %
Hematocrit: 44.8 % (ref 34.0–46.6)
Hemoglobin: 14.5 g/dL (ref 11.1–15.9)
Immature Grans (Abs): 0 10*3/uL (ref 0.0–0.1)
Immature Granulocytes: 0 %
Lymphocytes Absolute: 1.8 10*3/uL (ref 0.7–3.1)
Lymphs: 28 %
MCH: 29.1 pg (ref 26.6–33.0)
MCHC: 32.4 g/dL (ref 31.5–35.7)
MCV: 90 fL (ref 79–97)
Monocytes Absolute: 0.5 10*3/uL (ref 0.1–0.9)
Monocytes: 8 %
Neutrophils Absolute: 3.8 10*3/uL (ref 1.4–7.0)
Neutrophils: 59 %
Platelets: 235 10*3/uL (ref 150–450)
RBC: 4.99 x10E6/uL (ref 3.77–5.28)
RDW: 12.4 % (ref 11.7–15.4)
WBC: 6.3 10*3/uL (ref 3.4–10.8)

## 2023-04-19 NOTE — Progress Notes (Signed)
Subjective:  Patient ID: Shannon [name], female    DOB: June 12, 1961  Age: 62 y.o. MRN: [id number]  Chief Complaint  Patient presents with   Pre-op Exam    HPI Pt here for pre op evaluation - she will be having a total laproscopic hysterectomy next week.  She states currently she is not having any issues but having this surgery done due to the fact she has had fibroids and does have a bleeding polyp.  She does have a history of anemia because of this and is on daily iron She has had a cardiac clearance by [facility]rasekhar on 03/11/23.  EKG was done at that time which showed no acute abnormalities and no change from prior exam     04/19/2023   10:44 AM 11/16/2022    9:07 AM 07/08/2022    8:27 AM 11/19/2021    8:25 AM 10/22/2021    3:06 PM  Depression screen PHQ 2/9  Decreased Interest 0 0 0 0 0  Down, Depressed, Hopeless 0 0 0 0 0  PHQ - 2 Score 0 0 0 0 0  Altered sleeping 0      Tired, decreased energy 1      Change in appetite 0      Feeling bad or failure about yourself  0      Trouble concentrating 0      Moving slowly or fidgety/restless 0      Suicidal thoughts 0      PHQ-9 Score 1      Difficult doing work/chores Not difficult at all            10/22/2021    3:06 PM 11/19/2021    8:25 AM 07/08/2022    8:27 AM 11/16/2022    9:07 AM 04/19/2023   10:44 AM  Fall Risk  Falls in the past year? 0 0 0 0 0  Was there an injury with Fall? 0 0 0 0 0  Fall Risk Category Calculator 0 0 0 0 0  Fall Risk Category (Retired) Low Low Low    (RETIRED) Patient Fall Risk Level Low fall risk Low fall risk Low fall risk    Patient at Risk for Falls Due to   No Fall Risks History of fall(s) No Fall Risks  Fall risk Follow up   Falls evaluation completed Falls evaluation completed Falls evaluation completed     ROS CONSTITUTIONAL: Negative for chills, fatigue, fever, unintentional weight gain and unintentional weight loss.  E/N/T: Negative for ear pain, nasal congestion and sore throat.   CARDIOVASCULAR: Negative for chest pain, dizziness, palpitations and pedal edema.  RESPIRATORY: Negative for recent cough and dyspnea.  GASTROINTESTINAL: Negative for abdominal pain, acid reflux symptoms, constipation, diarrhea, nausea and vomiting.  GU - see HPI PSYCHIATRIC: Negative for sleep disturbance and to question depression screen.  Negative for depression, negative for anhedonia.    Current Outpatient Medications:    acetaminophen (TYLENOL) 650 MG CR tablet, Take 650 mg by mouth every 8 (eight) hours as needed for pain., Disp: , Rfl:    albuterol (VENTOLIN HFA) 108 (90 Base) MCG/ACT inhaler, INHALE 2 PUFFS INTO THE LUNGS EVERY 6 HOURS AS NEEDED FOR WHEEZING OR SHORTNESS OF BREATH, Disp: 6.7 g, Rfl: 1   busPIRone (BUSPAR) 10 MG tablet, TAKE 1/2 TABLET BY MOUTH TWICE DAILY, Disp: 60 tablet, Rfl: 1   calcium carbonate (TUMS - DOSED IN MG ELEMENTAL CALCIUM) 500 MG chewable tablet, Chew 1 tablet by mouth daily as needed  for indigestion or heartburn., Disp: , Rfl:    Cholecalciferol (VITAMIN D-3) 125 MCG (5000 UT) TABS, Take 5,000 Units by mouth daily at 12 noon., Disp: , Rfl:    Cyanocobalamin (B-12) 2500 MCG TABS, Take 2,500 mcg by mouth daily., Disp: , Rfl:    cyclobenzaprine (FLEXERIL) 10 MG tablet, Take 10 mg by mouth 3 (three) times daily as needed for muscle spasms., Disp: , Rfl:    ferrous sulfate 324 MG TBEC, Take 324 mg by mouth daily with breakfast., Disp: , Rfl:    gabapentin (NEURONTIN) 300 MG capsule, Take 300 mg by mouth at bedtime as needed (pain)., Disp: , Rfl:    lisinopril (ZESTRIL) 20 MG tablet, TAKE 1 TABLET(20 MG) BY MOUTH DAILY, Disp: 90 tablet, Rfl: 1   LORazepam (ATIVAN) 1 MG tablet, TAKE 1 TABLET(1 MG) BY MOUTH AT BEDTIME, Disp: 90 tablet, Rfl: 0   meloxicam (MOBIC) 7.5 MG tablet, Take 7.5 mg by mouth daily., Disp: , Rfl:    omeprazole (PRILOSEC) 40 MG capsule, Take 1 capsule (40 mg total) by mouth 2 (two) times daily. Take 30-60 minutes before meals, Disp: 60  capsule, Rfl: 5   REPATHA SURECLICK 140 MG/ML SOAJ, INJECT 1 ML UNDER THE SKIN ONCE EVERY 2 WEEKS, Disp: 2 mL, Rfl: 1   traMADol (ULTRAM) 50 MG tablet, Take 1 tablet (50 mg total) by mouth every 12 (twelve) hours as needed. 1 po twice a day prn for pain, Disp: 60 tablet, Rfl: 0  Past Medical History:  Diagnosis Date   Anemia    takes iron supplements, Pt states anemia is thought to be r/t a bleeding uterine polyp.   Anxiety    Follows w/ PCP, Dr. Harless Nakayama.   Arthritis    back, hips, shoulders, hands, knees   Asthma    Follows w/ pulmonology, Dr. [name].   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 [name].   Gastritis    Found on 10/2022 EGD. Following w/ Dr. Marina Goodell, GI.   GERD (gastroesophageal reflux disease)    History of kidney stones    passed   HLD (hyperlipidemia)    Follows w/ PCP, Dr. Harless Nakayama.   Hypertension    Follows w/ PCP, Benita Stabile @ [facility].   Pneumonia 02/2018   Rectal tumor    neuroendocrine carcinoma of rectum, follows w/ GI , Dr. Sherre Lain and Dr. Shirline Frees at [facility].   Seasonal allergies    Wears glasses    Objective:  PHYSICAL EXAM:   BP 120/70 (BP Location: Left Arm, Patient Position: Sitting, Cuff Size: Large)   Pulse 68   Temp (!) 97 F (36.1 C) (Temporal)   Ht 5' 5.5" (1.664 m)   Wt 181 lb 12.8 oz (82.5 kg)   LMP  (LMP Unknown)   SpO2 98%   BMI 29.79 kg/m    GEN: Well nourished, well developed, in no acute distress  HEENT: normal external ears and nose - normal external auditory canals and TMS - hearing grossly normal -  - Lips, Teeth and Gums - normal  Oropharynx - normal mucosa, palate, and posterior pharynx Cardiac: RRR; no murmurs, rubs, or gallops,no edema -  Respiratory:  normal respiratory rate and pattern with no distress - normal breath sounds with no rales, rhonchi, wheezes or rubs GI: normal bowel sounds, no masses or tenderness Psych: euthymic mood, appropriate affect and demeanor  Assessment & Plan:    Uterine polyp Follow up with GYN as scheduled Other iron deficiency anemia -     Iron, TIBC and Ferritin Panel Continue current therapy Primary hypertension -     CBC with Differential/Platelet -     Comprehensive metabolic panel Continue meds  Labwork pending    Follow-up: Return if symptoms worsen or fail to improve.  An After Visit Summary was printed and given to the patient.  Jettie Pagan [facility] [phone]

## 2023-04-20 ENCOUNTER — Other Ambulatory Visit: Payer: Self-pay

## 2023-04-20 LAB — IRON,TIBC AND FERRITIN PANEL
Ferritin: 49 ng/mL (ref 15–150)
Iron Saturation: 21 % (ref 15–55)
Iron: 64 ug/dL (ref 27–139)
Total Iron Binding Capacity: 306 ug/dL (ref 250–450)
UIBC: 242 ug/dL (ref 118–369)

## 2023-04-20 NOTE — Progress Notes (Unsigned)
Office Visit Note  Patient: Shannon [name]             Date of Birth: 03-01-1961           MRN: [id number]             PCP: [name], PA-C Referring: [name], PA-C Visit Date: 04/21/2023 Occupation: @GUAROCC @  Subjective:  Discuss results   History of Present Illness: Shannon [name] is a 62 y.o. female with history of osteoarthritis. Patient presents today to discuss x-ray and lab results from her initial office visit.  Patient reports she has noticed a 90% improvement in her joint pain and stiffness since reinitiating meloxicam for the first week of September.  Patient reports that she had an EGD revealed healed ulcers so she was cleared to resume meloxicam 7.5 mg 1 tablet daily as needed for pain relief.  She denies any joint swelling at this time.  Patient has an upcoming appointment with pain management on 05/18/2023.   Activities of Daily Living:  Patient reports morning stiffness for 1 hour.   Patient Reports nocturnal pain.  Difficulty dressing/grooming: Denies Difficulty climbing stairs: Denies Difficulty getting out of chair: Denies Difficulty using hands for taps, buttons, cutlery, and/or writing: Denies  Review of Systems  Constitutional:  Negative for fatigue.  HENT:  Negative for mouth sores, mouth dryness and nose dryness.   Eyes:  Negative for pain, visual disturbance and dryness.  Respiratory:  Negative for cough, hemoptysis, shortness of breath and difficulty breathing.   Cardiovascular:  Negative for chest pain, palpitations, hypertension and swelling in legs/feet.  Gastrointestinal:  Negative for blood in stool, constipation and diarrhea.  Endocrine: Positive for increased urination.  Genitourinary:  Negative for painful urination and involuntary urination.  Musculoskeletal:  Positive for joint pain, gait problem, joint pain, joint swelling, muscle weakness and morning stiffness. Negative for myalgias, muscle tenderness and myalgias.  Skin:  Positive  for sensitivity to sunlight. Negative for color change, pallor, rash, hair loss, nodules/bumps, skin tightness and ulcers.  Allergic/Immunologic: Negative for susceptible to infections.  Neurological:  Negative for dizziness, numbness, headaches and weakness.  Hematological:  Negative for swollen glands.  Psychiatric/Behavioral:  Negative for depressed mood and sleep disturbance. The patient is not nervous/anxious.     PMFS History:  Patient Active Problem List   Diagnosis Date Noted   Uterine polyp 04/19/2023   Absolute anemia 04/19/2023   Primary hypertension 04/19/2023   Preoperative clearance 03/11/2023   Aortic atherosclerosis (HCC) 03/11/2023   Coronary artery calcification 03/11/2023   Exertional dyspnea 02/24/2023   Primary osteoarthritis of both hips 02/24/2023   Frequency of urination 07/08/2022   Postmenopausal bleeding 07/08/2022   Gastroesophageal reflux disease with esophagitis without hemorrhage 04/02/2022   Centrilobular emphysema (HCC) 10/20/2021   Pain in both lower extremities 10/29/2020   Preop cardiovascular exam 04/30/2020   Other fatigue 04/30/2020   Needs flu shot 04/30/2020   Mixed hyperlipidemia 10/25/2019   Vitamin D insufficiency 10/25/2019   Gross hematuria 10/25/2019   Anxiety 10/25/2019   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   Anxiety    Follows w/ PCP, Dr. Harless Nakayama.   Arthritis  back, hips, shoulders, hands, knees   Asthma    Follows w/ pulmonology, Dr. [name].   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 [name].   Gastritis    Found on 10/2022 EGD. Following w/ Dr. Marina Goodell, GI.   GERD (gastroesophageal reflux disease)    History of kidney stones    passed   HLD (hyperlipidemia)    Follows w/ PCP, Dr. Harless Nakayama.   Hypertension    Follows w/ PCP, Benita Stabile @ [facility].   Pneumonia 02/2018   Rectal tumor    neuroendocrine carcinoma of rectum, follows w/ GI , Dr. Sherre Lain and Dr. Shirline Frees at [facility].   Seasonal allergies    Wears glasses     Family History  Problem Relation Age of Onset   Heart disease Mother    COPD Mother    Emphysema Mother    Fibromyalgia Mother    Arthritis Mother    Diabetes Father    Healthy Son    Healthy Daughter    Colon cancer Neg Hx    Colon polyps Neg Hx    Esophageal cancer Neg Hx    Rectal cancer Neg Hx    Stomach cancer Neg Hx    Past Surgical History:  Procedure Laterality Date   ABLATION     endometrial   BIOPSY  10/10/2018   Procedure: BIOPSY;  Surgeon: Meridee Score, Netty Starring., MD;  Location: [facility] ENDOSCOPY;  Service: Gastroenterology;;   BIOPSY  06/12/2019   Procedure: BIOPSY;  Surgeon: Lemar Lofty., MD;  Location: [facility] ENDOSCOPY;  Service: Gastroenterology;;   BIOPSY  09/30/2020   Procedure: BIOPSY;  Surgeon: Lemar Lofty., MD;  Location: [facility] ENDOSCOPY;  Service: Gastroenterology;;   BIOPSY  11/18/2022   Procedure: BIOPSY;  Surgeon: Lemar Lofty., MD;  Location: [facility] ENDOSCOPY;  Service: Gastroenterology;;   COLONOSCOPY     COLONOSCOPY WITH PROPOFOL N/A 11/18/2022   Procedure: COLONOSCOPY WITH PROPOFOL;  Surgeon: Lemar Lofty., MD;  Location: [facility] ENDOSCOPY;  Service: Gastroenterology;  Laterality: N/A;   DOPPLER ECHOCARDIOGRAPHY     ENDOSCOPIC MUCOSAL RESECTION N/A 10/10/2018   Procedure: ENDOSCOPIC MUCOSAL RESECTION;  Surgeon: Meridee Score, Netty Starring., MD;   Location: [facility] ENDOSCOPY;  Service: Gastroenterology;  Laterality: N/A;   ESOPHAGOGASTRODUODENOSCOPY N/A 11/18/2022   Procedure: ESOPHAGOGASTRODUODENOSCOPY (EGD);  Surgeon: Lemar Lofty., MD;  Location: [facility] ENDOSCOPY;  Service: Gastroenterology;  Laterality: N/A;   ESOPHAGOGASTRODUODENOSCOPY  03/30/2023   normal   EUS N/A 07/18/2018   Procedure: LOWER ENDOSCOPIC ULTRASOUND (EUS);  Surgeon: Lemar Lofty., MD;  Location: [facility] ENDOSCOPY;  Service: Gastroenterology;  Laterality: N/A;   EUS N/A 10/10/2018   Procedure: LOWER ENDOSCOPIC ULTRASOUND (EUS);  Surgeon: Lemar Lofty., MD;  Location: [facility] END[facility]COPY;  Service: Gastroenterology;  Laterality: N/A;   EUS N/A 06/12/2019   Procedure: LOWER ENDOSCOPIC ULTRASOUND (EUS);  Surgeon: Lemar Lofty., MD;  Location: [facility] ENDOSCOPY;  Service: Gastroenterology;  Laterality: N/A;   EUS N/A 09/30/2020   Procedure: LOWER ENDOSCOPIC ULTRASOUND (EUS);  Surgeon: Lemar Lofty., MD;  Location: [facility] ENDOSCOPY;  Service: Gastroenterology;  Laterality: N/A;   EUS N/A 11/18/2022   Procedure: LOWER ENDOSCOPIC ULTRASOUND (EUS);  Surgeon:  Mansouraty, Netty Starring., MD;  Location: [facility] ENDOSCOPY;  Service: Gastroenterology;  Laterality: N/A;   FLEXIBLE SIGMOIDOSCOPY N/A 07/18/2018   Procedure: FLEXIBLE SIGMOIDOSCOPY;  Surgeon: Meridee Score Netty Starring., MD;  Location: [facility] ENDOSCOPY;  Service: Gastroenterology;  Laterality: N/A;   FLEXIBLE SIGMOIDOSCOPY N/A 10/10/2018   Procedure: FLEXIBLE SIGMOIDOSCOPY;  Surgeon: Meridee Score Netty Starring., MD;  Location: [facility] ENDOSCOPY;  Service: Gastroenterology;  Laterality: N/A;   FLEXIBLE SIGMOIDOSCOPY N/A 06/12/2019   Procedure: FLEXIBLE SIGMOIDOSCOPY;  Surgeon: Meridee Score Netty Starring., MD;  Location: [facility] ENDOSCOPY;  Service: Gastroenterology;  Laterality: N/A;   FLEXIBLE SIGMOIDOSCOPY N/A 09/30/2020   Procedure: FLEXIBLE SIGMOIDOSCOPY;  Surgeon: Meridee Score Netty Starring., MD;  Location: [facility] ENDOSCOPY;  Service:  Gastroenterology;  Laterality: N/A;   HEMOSTASIS CLIP PLACEMENT  10/10/2018   Procedure: HEMOSTASIS CLIP PLACEMENT;  Surgeon: Lemar Lofty., MD;  Location: [facility] ENDOSCOPY;  Service: Gastroenterology;;   LUNG CANCER SURGERY  12/30/2018   right lun- mid lobe , dr gerhardt   POLYPECTOMY  11/18/2022   Procedure: POLYPECTOMY;  Surgeon: Lemar Lofty., MD;  Location: [facility] ENDOSCOPY;  Service: Gastroenterology;;   Gaspar Bidding DILATION N/A 11/18/2022   Procedure: Jacklyn Shell;  Surgeon: Lemar Lofty., MD;  Location: [facility] ENDOSCOPY;  Service: Gastroenterology;  Laterality: N/A;   SUBMUCOSAL LIFTING INJECTION  10/10/2018   Procedure: SUBMUCOSAL LIFTING INJECTION;  Surgeon: Meridee Score Netty Starring., MD;  Location: [facility] ENDOSCOPY;  Service: Gastroenterology;;   TEE WITHOUT CARDIOVERSION  02/2018   TONSILLECTOMY     TOTAL HIP ARTHROPLASTY Left 11/2021   TUBAL LIGATION  1987   VIDEO ASSISTED THORACOSCOPY (VATS)/WEDGE RESECTION Right 12/30/2018   Procedure: VIDEO ASSISTED THORACOSCOPY (VATS), MINI THORACOTOMY, RIGHT MIDDLE LOBECTOMY WITH LYMPH NODE DISSECTION;  Surgeon: Delight Ovens, MD;  Location: [facility] OR;  Service: Thoracic;  Laterality: Right;   VIDEO BRONCHOSCOPY N/A 10/12/2018   Procedure: VIDEO BRONCHOSCOPY;  Surgeon: Delight Ovens, MD;  Location: [facility] OR;  Service: Thoracic;  Laterality: N/A;   VIDEO BRONCHOSCOPY N/A 12/30/2018   Procedure: VIDEO BRONCHOSCOPY;  Surgeon: Delight Ovens, MD;  Location: [facility] OR;  Service: Thoracic;  Laterality: N/A;   WISDOM TOOTH EXTRACTION     Social History   Social History Narrative   Not on file   Immunization History  Administered Date(s) Administered   Influenza Inj Mdck Quad Pf 05/25/2017, 04/30/2020, 05/14/2021, 07/08/2022   Influenza,inj,Quad PF,6+ Mos 05/24/2018, 05/24/2018   PFIZER(Purple Top)SARS-COV-2 Vaccination 09/25/2019, 10/16/2019, 06/27/2020   Tdap 09/15/2017   Zoster Recombinant(Shingrix) 09/11/2021, 02/25/2022      Objective: Vital Signs: BP 130/79 (BP Location: Left Arm, Patient Position: Sitting, Cuff Size: Normal)   Pulse 73   Resp 16   Ht 5' 5.5" (1.664 m)   Wt 181 lb (82.1 kg)   LMP  (LMP Unknown)   BMI 29.66 kg/m    Physical Exam Vitals and nursing note reviewed.  Constitutional:      Appearance: She is well-developed.  HENT:     Head: Normocephalic and atraumatic.  Eyes:     Conjunctiva/sclera: Conjunctivae normal.  Cardiovascular:     Rate and Rhythm: Normal rate and regular rhythm.     Heart sounds: Normal heart sounds.  Pulmonary:     Effort: Pulmonary effort is normal.     Breath sounds: Normal breath sounds.  Abdominal:     General: Bowel sounds are normal.     Palpations: Abdomen is soft.  Musculoskeletal:     Cervical back: Normal range of motion.  Lymphadenopathy:     Cervical: No  cervical adenopathy.  Skin:    General: Skin is warm and dry.     Capillary Refill: Capillary refill takes less than 2 seconds.  Neurological:     Mental Status: She is alert and oriented to person, place, and time.  Psychiatric:        Behavior: Behavior normal.      Musculoskeletal Exam: C-spine has good range of motion.  No midline spinal tenderness.  Shoulder joints, elbow joints, wrist joints, MCPs, PIPs, DIPs have good range of motion with no synovitis.  PIP and DIP thickening consistent with osteoarthritis of both hands.  No tenderness or synovitis over MCP joints.  Complete fist formation noted bilaterally.  Left hip replacement has good ROM.  Right hip has good range of motion with no groin pain.  Knee joints have good range of motion with no warmth or effusion.  Ankle joints have good range of motion with no tenderness or joint swelling.  No evidence of Achilles tendinitis.  CDAI Exam: CDAI Score: -- Patient Global: --; Provider Global: -- Swollen: --; Tender: -- Joint Exam 04/21/2023   No joint exam has been documented for this visit   There is currently no  information documented on the homunculus. Go to the Rheumatology activity and complete the homunculus joint exam.  Investigation: No additional findings.  Imaging: XR Lumbar Spine 2-3 Views  Result Date: 03/23/2023 Multilevel spondylosis with most significant narrowing between L3-L4 was noted.  Facet joint arthropathy was noted.  Atherosclerosis of the aorta was noted.  Prosthesis was noted in the left hip. Impression: These findings are suggestive of multilevel spondylosis and facet joint arthropathy.  XR Foot 2 Views Left  Result Date: 03/23/2023 PIP and DIP narrowing was noted.  No MTP, intertarsal, tibiotalar or subtalar joint space narrowing was noted.  Inferior calcaneal spur was noted. Impression: These findings were suggestive of osteoarthritis of the foot.  XR Foot 2 Views Right  Result Date: 03/23/2023 PIP and DIP narrowing was noted.  No MTP, intertarsal, tibiotalar or subtalar joint space narrowing was noted.  Inferior and posterior calcaneal spurs were noted. Impression: These findings were suggestive of osteoarthritis of the foot.  XR KNEE 3 VIEW LEFT  Result Date: 03/23/2023 No medial or lateral compartment narrowing was noted.  No patellofemoral narrowing was noted.  No chondrocalcinosis was noted. Impression: Unremarkable x-rays of the knee.  XR KNEE 3 VIEW RIGHT  Result Date: 03/23/2023 No medial or lateral compartment narrowing was noted.  No patellofemoral narrowing was noted.  No chondrocalcinosis was noted. Impression: Unremarkable x-rays of the knee.  XR Hand 2 View Left  Result Date: 03/23/2023 [facility], PIP and DIP narrowing was noted.  No MCP, intercarpal or radiocarpal joint space narrowing was noted.  No erosive changes were noted. Impression: These findings are suggestive of osteoarthritis of the hand.  XR Hand 2 View Right  Result Date: 03/23/2023 [facility], PIP and DIP narrowing was noted.  No MCP, intercarpal or radiocarpal joint space narrowing was noted.  No  erosive changes were noted. Impression: These findings are suggestive of osteoarthritis of the hand.   Recent Labs: Lab Results  Component Value Date   WBC 6.3 04/19/2023   HGB 14.5 04/19/2023   PLT 235 04/19/2023   NA 142 04/19/2023   K 5.6 (H) 04/19/2023   CL 104 04/19/2023   CO2 25 04/19/2023   GLUCOSE 82 04/19/2023   BUN 21 04/19/2023   CREATININE 0.84 04/19/2023   BILITOT 0.3 04/19/2023   ALKPHOS 77  04/19/2023   AST 20 04/19/2023   ALT 15 04/19/2023   PROT 6.5 04/19/2023   ALBUMIN 4.3 04/19/2023   CALCIUM 10.4 (H) 04/19/2023   GFRAA 86 04/30/2020    Speciality Comments: No specialty comments available.  Procedures:  No procedures performed Allergies: Hydrocodone, Pravastatin, Levaquin [levofloxacin in d5w], and Other   Assessment / Plan:     Visit Diagnoses: Primary osteoarthritis of both hands - 03/23/23:RF-, Anti-CCP-, HLA-B27-, CK WNL, ESR WNL, CRP WNL, uric acid WNL: Reviewed x-ray and lab results from her initial office visit.  X-rays and clinical findings are consistent with osteoarthritis.  No tenderness or synovitis noted over MCP joints.  Complete fist formation noted bilaterally.  Discussed natural anti-inflammatories including turmeric, tart cherry, ginger, and omega-3.  Patient was also given a jar gripper to assist her.  She was given a handout of exercises to perform.  Discussed the importance of joint protection and muscle strengthening.   Patient was advised to notify us if her symptoms persist or worsen at which time we can schedule an ultrasound in the future if needed.   Discouraged the long-term use of meloxicam especially given history of gastric ulcers.  She plans on taking meloxicam 7.5 mg 1 tablet daily as needed for pain relief which was restarted during the first week of September after the EGD revealed healed ulcerations per patient.  Referral to pain management was placed at her initial office visit-- she has a consultation with pain management  scheduled on 05/18/2023 to discuss alternatives to meloxicam for chronic pain relief.  She will follow-up in the office in 6 months or sooner if needed.  Chronic pain of both knees - XR unremarkable: She has good range of motion of both knee joints on examination today.  No warmth or effusion noted.  Her knee joint pain has improved since resuming meloxicam 7.5 mg 1 tablet daily during the first week of September.  Primary osteoarthritis of both feet: X-rays and clinical findings are consistent with osteoarthritis of both feet.  Both ankle joints have good range of motion with no tenderness or synovitis.  Status post total hip replacement, left: Doing well.  No groin pain.  Spondylosis without myelopathy or radiculopathy, lumbar region - XR-suggestive of multilevel spondylosis and facet joint arthropathy.  Her lower back pain has improved since [facility] care [facility].  She has been taking meloxicam 7.5 mg 1 tablet daily which has also been helpful at alleviating her discomfort.  Other medical conditions are listed as follows:  Coronary artery calcification  Aortic atherosclerosis (HCC)  Centrilobular emphysema (HCC)  Carcinoid bronchial adenoma, right (HCC)  S/P lobectomy of lung  Malignant carcinoid tumor of rectum (HCC)  Gastroesophageal reflux disease with esophagitis without hemorrhage  Vitamin D insufficiency  Mixed hyperlipidemia  Anxiety  Essential hypertension: Blood pressure was 130/79 today in the office.  Family history of Crohn's disease  Orders: No orders of the defined types were placed in this encounter.  No orders of the defined types were placed in this encounter.   Follow-Up Instructions: Return in about 6 months (around 10/19/2023) for Osteoarthritis.   Gearldine Bienenstock, PA-C  Note - This record has been created using Dragon software.  Chart creation errors have been sought, but may not always  have been located. Such creation  errors do not reflect on  the standard of medical care.

## 2023-04-21 ENCOUNTER — Other Ambulatory Visit: Payer: Self-pay | Admitting: Physician Assistant

## 2023-04-21 ENCOUNTER — Encounter: Payer: Self-pay | Admitting: Physician Assistant

## 2023-04-21 ENCOUNTER — Ambulatory Visit: Payer: BC Managed Care – PPO | Attending: Physician Assistant | Admitting: Physician Assistant

## 2023-04-21 VITALS — BP 130/79 | HR 73 | Resp 16 | Ht 65.5 in | Wt 181.0 lb

## 2023-04-21 DIAGNOSIS — F419 Anxiety disorder, unspecified: Secondary | ICD-10-CM

## 2023-04-21 DIAGNOSIS — M25562 Pain in left knee: Secondary | ICD-10-CM

## 2023-04-21 DIAGNOSIS — I2510 Atherosclerotic heart disease of native coronary artery without angina pectoris: Secondary | ICD-10-CM

## 2023-04-21 DIAGNOSIS — Z8379 Family history of other diseases of the digestive system: Secondary | ICD-10-CM

## 2023-04-21 DIAGNOSIS — I10 Essential (primary) hypertension: Secondary | ICD-10-CM

## 2023-04-21 DIAGNOSIS — E782 Mixed hyperlipidemia: Secondary | ICD-10-CM

## 2023-04-21 DIAGNOSIS — Z96642 Presence of left artificial hip joint: Secondary | ICD-10-CM

## 2023-04-21 DIAGNOSIS — J432 Centrilobular emphysema: Secondary | ICD-10-CM

## 2023-04-21 DIAGNOSIS — I2584 Coronary atherosclerosis due to calcified coronary lesion: Secondary | ICD-10-CM

## 2023-04-21 DIAGNOSIS — K2100 Gastro-esophageal reflux disease with esophagitis, without bleeding: Secondary | ICD-10-CM

## 2023-04-21 DIAGNOSIS — C7A090 Malignant carcinoid tumor of the bronchus and lung: Secondary | ICD-10-CM

## 2023-04-21 DIAGNOSIS — M19041 Primary osteoarthritis, right hand: Secondary | ICD-10-CM

## 2023-04-21 DIAGNOSIS — M19071 Primary osteoarthritis, right ankle and foot: Secondary | ICD-10-CM | POA: Diagnosis not present

## 2023-04-21 DIAGNOSIS — I700 Atherosclerosis of aorta: Secondary | ICD-10-CM

## 2023-04-21 DIAGNOSIS — M47816 Spondylosis without myelopathy or radiculopathy, lumbar region: Secondary | ICD-10-CM

## 2023-04-21 DIAGNOSIS — Z902 Acquired absence of lung [part of]: Secondary | ICD-10-CM

## 2023-04-21 DIAGNOSIS — G8929 Other chronic pain: Secondary | ICD-10-CM

## 2023-04-21 DIAGNOSIS — M19072 Primary osteoarthritis, left ankle and foot: Secondary | ICD-10-CM

## 2023-04-21 DIAGNOSIS — M25561 Pain in right knee: Secondary | ICD-10-CM

## 2023-04-21 DIAGNOSIS — E559 Vitamin D deficiency, unspecified: Secondary | ICD-10-CM

## 2023-04-21 DIAGNOSIS — C7A026 Malignant carcinoid tumor of the rectum: Secondary | ICD-10-CM

## 2023-04-21 DIAGNOSIS — M19042 Primary osteoarthritis, left hand: Secondary | ICD-10-CM

## 2023-04-21 NOTE — Patient Instructions (Signed)

## 2023-04-26 ENCOUNTER — Encounter (HOSPITAL_COMMUNITY)
Admission: RE | Admit: 2023-04-26 | Discharge: 2023-04-26 | Disposition: A | Discharge status: Home / Self Care | Payer: BC Managed Care – PPO | Source: Ambulatory Visit | Attending: Obstetrics and Gynecology | Admitting: Obstetrics and Gynecology

## 2023-04-26 DIAGNOSIS — D259 Leiomyoma of uterus, unspecified: Secondary | ICD-10-CM | POA: Diagnosis not present

## 2023-04-26 DIAGNOSIS — N950 Postmenopausal bleeding: Secondary | ICD-10-CM | POA: Diagnosis present

## 2023-04-26 DIAGNOSIS — J45909 Unspecified asthma, uncomplicated: Secondary | ICD-10-CM | POA: Diagnosis not present

## 2023-04-26 DIAGNOSIS — N816 Rectocele: Secondary | ICD-10-CM | POA: Diagnosis not present

## 2023-04-26 DIAGNOSIS — Z01812 Encounter for preprocedural laboratory examination: Secondary | ICD-10-CM | POA: Insufficient documentation

## 2023-04-26 DIAGNOSIS — Z96642 Presence of left artificial hip joint: Secondary | ICD-10-CM | POA: Diagnosis not present

## 2023-04-26 DIAGNOSIS — Z85118 Personal history of other malignant neoplasm of bronchus and lung: Secondary | ICD-10-CM | POA: Diagnosis not present

## 2023-04-26 DIAGNOSIS — N8110 Cystocele, unspecified: Secondary | ICD-10-CM | POA: Diagnosis not present

## 2023-04-26 DIAGNOSIS — I10 Essential (primary) hypertension: Secondary | ICD-10-CM | POA: Diagnosis not present

## 2023-04-26 DIAGNOSIS — Z87891 Personal history of nicotine dependence: Secondary | ICD-10-CM | POA: Diagnosis not present

## 2023-04-26 LAB — COMPREHENSIVE METABOLIC PANEL
ALT: 20 U/L (ref 0–44)
AST: 20 U/L (ref 15–41)
Albumin: 4.1 g/dL (ref 3.5–5.0)
Alkaline Phosphatase: 61 U/L (ref 38–126)
Anion gap: 6 (ref 5–15)
BUN: 25 mg/dL — ABNORMAL HIGH (ref 8–23)
CO2: 26 mmol/L (ref 22–32)
Calcium: 9.5 mg/dL (ref 8.9–10.3)
Chloride: 108 mmol/L (ref 98–111)
Creatinine, Ser: 1.53 mg/dL — ABNORMAL HIGH (ref 0.44–1.00)
GFR, Estimated: 38 mL/min — ABNORMAL LOW (ref >60)
Glucose, Bld: 87 mg/dL (ref 70–99)
Potassium: 4.8 mmol/L (ref 3.5–5.1)
Sodium: 140 mmol/L (ref 135–145)
Total Bilirubin: 0.4 mg/dL (ref 0.3–1.2)
Total Protein: 6.7 g/dL (ref 6.5–8.1)

## 2023-04-26 LAB — CBC
HCT: 44.9 % (ref 36.0–46.0)
Hemoglobin: 14.4 g/dL (ref 12.0–15.0)
MCH: 29.1 pg (ref 26.0–34.0)
MCHC: 32.1 g/dL (ref 30.0–36.0)
MCV: 90.7 fL (ref 80.0–100.0)
Platelets: 223 10*3/uL (ref 150–400)
RBC: 4.95 MIL/uL (ref 3.87–5.11)
RDW: 13 % (ref 11.5–15.5)
WBC: 6.5 10*3/uL (ref 4.0–10.5)
nRBC: 0 % (ref 0.0–0.2)

## 2023-04-28 NOTE — H&P (Signed)
Shannon [name] is an 62 y.o. female.  Pt with prior novasure ablation 2005. Continued to have menses thereafter until stopped with menopause. Previous US-guided EMB for PMB WNL in 2021. She had no further bleeding until recently and has had two episodes of light vaginal bleeding.  Had repeat pipelle and SIUS in February, has either a polyp or submucosal myoma and several other myomas measuring up to 6 cm, pipelle was atrophic with benign polyp.  Dr. Senaida Ores saw her in April and discussed options, elected to proceed with laparoscopic hysterectomy.  However, Dr. Senaida Ores is out, and initial surgery postponed for medical clearance, which she now has.  Pertinent Gynecological History: Last mammogram: normal Date: 07/2022 Last pap: normal Date: 07/2022 OB History: G2, P2002 SVD x 2   Menstrual History: No LMP recorded (lmp unknown). Patient is postmenopausal.    Past Medical History:  Diagnosis Date   Anemia    takes iron supplements, Pt states anemia is thought to be r/t a bleeding uterine polyp.   Anxiety    Follows w/ PCP, Dr. Harless Nakayama.   Arthritis    back, hips, shoulders, hands, knees   Asthma    Follows w/ pulmonology, Dr. [name].   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 [name].   Gastritis    Found on 10/2022 EGD. Following w/ Dr. Marina Goodell, GI.   GERD (gastroesophageal reflux disease)    History of kidney stones    passed   HLD (hyperlipidemia)    Follows w/ PCP, Dr. Harless Nakayama.   Hypertension    Follows w/ PCP, Benita Stabile @ [facility].   Pneumonia 02/2018   Rectal tumor    neuroendocrine carcinoma of rectum, follows w/ GI , Dr. Sherre Lain and Dr. Shirline Frees at [facility].   Seasonal allergies    Wears  glasses     Past Surgical History:  Procedure Laterality Date   ABLATION     endometrial   BIOPSY  10/10/2018   Procedure: BIOPSY;  Surgeon: Meridee Score Netty Starring., MD;  Location: [facility] ENDOSCOPY;  Service: Gastroenterology;;   BIOPSY  06/12/2019   Procedure: BIOPSY;  Surgeon: Lemar Lofty., MD;  Location: [facility] ENDOSCOPY;  Service: Gastroenterology;;   BIOPSY  09/30/2020   Procedure: BIOPSY;  Surgeon: Lemar Lofty., MD;  Location: [facility] ENDOSCOPY;  Service: Gastroenterology;;   BIOPSY  11/18/2022   Procedure: BIOPSY;  Surgeon: Lemar Lofty., MD;  Location: [facility] ENDOSCOPY;  Service: Gastroenterology;;   COLONOSCOPY     COLONOSCOPY WITH PROPOFOL N/A 11/18/2022   Procedure: COLONOSCOPY WITH PROPOFOL;  Surgeon: Lemar Lofty., MD;  Location: [facility] ENDOSCOPY;  Service: Gastroenterology;  Laterality: N/A;   DOPPLER ECHOCARDIOGRAPHY     ENDOSCOPIC MUCOSAL RESECTION N/A 10/10/2018   Procedure: ENDOSCOPIC MUCOSAL RESECTION;  Surgeon: Meridee Score, Netty Starring., MD;  Location: [facility] ENDOSCOPY;  Service: Gastroenterology;  Laterality: N/A;   ESOPHAGOGASTRODUODENOSCOPY N/A 11/18/2022   Procedure: ESOPHAGOGASTRODUODENOSCOPY (EGD);  Surgeon: Lemar Lofty., MD;  Location: [facility] ENDOSCOPY;  Service: Gastroenterology;  Laterality: N/A;   ESOPHAGOGASTRODUODENOSCOPY  03/30/2023   normal   EUS N/A 07/18/2018   Procedure: LOWER ENDOSCOPIC ULTRASOUND (EUS);  Surgeon: Meridee Score Netty Starring., MD;  Location: [facility]  ENDOSCOPY;  Service: Gastroenterology;  Laterality: N/A;   EUS N/A 10/10/2018   Procedure: LOWER ENDOSCOPIC ULTRASOUND (EUS);  Surgeon: Lemar Lofty., MD;  Location: [facility] ENDOSCOPY;  Service: Gastroenterology;  Laterality: N/A;   EUS N/A 06/12/2019   Procedure: LOWER ENDOSCOPIC ULTRASOUND (EUS);  Surgeon: Lemar Lofty., MD;  Location: [facility] ENDOSCOPY;  Service: Gastroenterology;  Laterality: N/A;   EUS N/A 09/30/2020   Procedure: LOWER ENDOSCOPIC ULTRASOUND  (EUS);  Surgeon: Lemar Lofty., MD;  Location: [facility] ENDOSCOPY;  Service: Gastroenterology;  Laterality: N/A;   EUS N/A 11/18/2022   Procedure: LOWER ENDOSCOPIC ULTRASOUND (EUS);  Surgeon: Lemar Lofty., MD;  Location: [facility] ENDOSCOPY;  Service: Gastroenterology;  Laterality: N/A;   FLEXIBLE SIGMOIDOSCOPY N/A 07/18/2018   Procedure: FLEXIBLE SIGMOIDOSCOPY;  Surgeon: Meridee Score Netty Starring., MD;  Location: [facility] ENDOSCOPY;  Service: Gastroenterology;  Laterality: N/A;   FLEXIBLE SIGMOIDOSCOPY N/A 10/10/2018   Procedure: FLEXIBLE SIGMOIDOSCOPY;  Surgeon: Meridee Score Netty Starring., MD;  Location: [facility] ENDOSCOPY;  Service: Gastroenterology;  Laterality: N/A;   FLEXIBLE SIGMOIDOSCOPY N/A 06/12/2019   Procedure: FLEXIBLE SIGMOIDOSCOPY;  Surgeon: Meridee Score Netty Starring., MD;  Location: [facility] ENDOSCOPY;  Service: Gastroenterology;  Laterality: N/A;   FLEXIBLE SIGMOIDOSCOPY N/A 09/30/2020   Procedure: FLEXIBLE SIGMOIDOSCOPY;  Surgeon: Meridee Score Netty Starring., MD;  Location: [facility] ENDOSCOPY;  Service: Gastroenterology;  Laterality: N/A;   HEMOSTASIS CLIP PLACEMENT  10/10/2018   Procedure: HEMOSTASIS CLIP PLACEMENT;  Surgeon: Lemar Lofty., MD;  Location: [facility] ENDOSCOPY;  Service: Gastroenterology;;   LUNG CANCER SURGERY  12/30/2018   right lun- mid lobe , dr gerhardt   POLYPECTOMY  11/18/2022   Procedure: POLYPECTOMY;  Surgeon: Lemar Lofty., MD;  Location: [facility] ENDOSCOPY;  Service: Gastroenterology;;   Gaspar Bidding DILATION N/A 11/18/2022   Procedure: Jacklyn Shell;  Surgeon: Lemar Lofty., MD;  Location: [facility] ENDOSCOPY;  Service: Gastroenterology;  Laterality: N/A;   SUBMUCOSAL LIFTING INJECTION  10/10/2018   Procedure: SUBMUCOSAL LIFTING INJECTION;  Surgeon: Meridee Score Netty Starring., MD;  Location: [facility] ENDOSCOPY;  Service: Gastroenterology;;   TEE WITHOUT CARDIOVERSION  02/2018   TONSILLECTOMY     TOTAL HIP ARTHROPLASTY Left 11/2021   TUBAL LIGATION  1987   VIDEO ASSISTED  THORACOSCOPY (VATS)/WEDGE RESECTION Right 12/30/2018   Procedure: VIDEO ASSISTED THORACOSCOPY (VATS), MINI THORACOTOMY, RIGHT MIDDLE LOBECTOMY WITH LYMPH NODE DISSECTION;  Surgeon: Delight Ovens, MD;  Location: [facility] OR;  Service: Thoracic;  Laterality: Right;   VIDEO BRONCHOSCOPY N/A 10/12/2018   Procedure: VIDEO BRONCHOSCOPY;  Surgeon: Delight Ovens, MD;  Location: [facility] OR;  Service: Thoracic;  Laterality: N/A;   VIDEO BRONCHOSCOPY N/A 12/30/2018   Procedure: VIDEO BRONCHOSCOPY;  Surgeon: Delight Ovens, MD;  Location: [facility] OR;  Service: Thoracic;  Laterality: N/A;   WISDOM TOOTH EXTRACTION      Family History  Problem Relation Age of Onset   Heart disease Mother    COPD Mother    Emphysema Mother    Fibromyalgia Mother    Arthritis Mother    Diabetes Father    Healthy Son    Healthy Daughter    Colon cancer Neg Hx    Colon polyps Neg Hx    Esophageal cancer Neg Hx    Rectal cancer Neg Hx    Stomach cancer Neg Hx     Social History:  reports that she quit smoking about 16 years ago. Her smoking use included cigarettes. She started smoking about 49 years ago. She has a 33 pack-year smoking history. She has been exposed to tobacco  smoke. She has never used smokeless tobacco. She reports that she does not currently use alcohol. She reports that she does not use drugs.  Allergies:  Allergies  Allergen Reactions   Hydrocodone Nausea Only and Other (See Comments)    "terrible headache"   Pravastatin Other (See Comments)    Muscle/joint pain   Levaquin [Levofloxacin In D5w] Nausea And Vomiting        Other      Dog Dander     No medications prior to admission.    Review of Systems  Respiratory: Negative.    Cardiovascular: Negative.     Weight 83.9 kg. Physical Exam Constitutional:      Appearance: Normal appearance.  Cardiovascular:     Rate and Rhythm: Normal rate and regular rhythm.     Heart sounds: Normal heart sounds. No murmur heard. Pulmonary:      Effort: Pulmonary effort is normal. No respiratory distress.     Breath sounds: Normal breath sounds. No wheezing.  Abdominal:     General: There is no distension.     Palpations: Abdomen is soft. There is no mass.     Tenderness: There is no abdominal tenderness.  Genitourinary:    General: Normal vulva.     Comments: Uterus irregular, 12 weeks size No adnexal mass Gr 1 cystocele, Gr 2 rectocele Musculoskeletal:     Cervical back: Normal range of motion and neck supple.  Neurological:     Mental Status: She is alert.     No results found for this or any previous visit (from the past 24 hour(s)).  No results found.  Assessment/Plan: PMB with myomas and possible endometrial polyp, as well as cystocele and rectocele.  All options have been discussed.  Surgical procedure, risks, alternatives, chances of relieving her symptoms have all been discussed, questions answered.  Will admit for TLH, BSO, probable vaginal morcellation and cuff closure, A&P repair, cystoscopy  Leighton Roach Alyna Stensland 04/28/2023, 3:29 PM

## 2023-04-29 ENCOUNTER — Other Ambulatory Visit: Payer: Self-pay

## 2023-04-29 ENCOUNTER — Encounter (HOSPITAL_BASED_OUTPATIENT_CLINIC_OR_DEPARTMENT_OTHER): Payer: Self-pay | Admitting: Obstetrics and Gynecology

## 2023-04-29 ENCOUNTER — Encounter (HOSPITAL_BASED_OUTPATIENT_CLINIC_OR_DEPARTMENT_OTHER)
Admission: RE | Disposition: A | Discharge status: Home / Self Care | Payer: Self-pay | Source: Home / Self Care | Attending: Obstetrics and Gynecology

## 2023-04-29 ENCOUNTER — Observation Stay (HOSPITAL_BASED_OUTPATIENT_CLINIC_OR_DEPARTMENT_OTHER)
Admission: RE | Admit: 2023-04-29 | Discharge: 2023-04-29 | Disposition: A | Discharge status: Home / Self Care | Payer: BC Managed Care – PPO | Attending: Obstetrics and Gynecology | Admitting: Obstetrics and Gynecology

## 2023-04-29 ENCOUNTER — Ambulatory Visit (HOSPITAL_BASED_OUTPATIENT_CLINIC_OR_DEPARTMENT_OTHER): Payer: BC Managed Care – PPO | Admitting: Anesthesiology

## 2023-04-29 DIAGNOSIS — N950 Postmenopausal bleeding: Secondary | ICD-10-CM | POA: Diagnosis present

## 2023-04-29 DIAGNOSIS — N8110 Cystocele, unspecified: Secondary | ICD-10-CM | POA: Diagnosis not present

## 2023-04-29 DIAGNOSIS — Z87891 Personal history of nicotine dependence: Secondary | ICD-10-CM | POA: Insufficient documentation

## 2023-04-29 DIAGNOSIS — J45909 Unspecified asthma, uncomplicated: Secondary | ICD-10-CM | POA: Insufficient documentation

## 2023-04-29 DIAGNOSIS — Z01818 Encounter for other preprocedural examination: Principal | ICD-10-CM

## 2023-04-29 DIAGNOSIS — Z96642 Presence of left artificial hip joint: Secondary | ICD-10-CM | POA: Diagnosis not present

## 2023-04-29 DIAGNOSIS — I10 Essential (primary) hypertension: Secondary | ICD-10-CM | POA: Insufficient documentation

## 2023-04-29 DIAGNOSIS — N816 Rectocele: Secondary | ICD-10-CM | POA: Insufficient documentation

## 2023-04-29 DIAGNOSIS — D259 Leiomyoma of uterus, unspecified: Secondary | ICD-10-CM | POA: Diagnosis not present

## 2023-04-29 DIAGNOSIS — Z85118 Personal history of other malignant neoplasm of bronchus and lung: Secondary | ICD-10-CM | POA: Diagnosis not present

## 2023-04-29 DIAGNOSIS — Z90710 Acquired absence of both cervix and uterus: Secondary | ICD-10-CM | POA: Diagnosis present

## 2023-04-29 HISTORY — PX: ANTERIOR AND POSTERIOR REPAIR: SHX5121

## 2023-04-29 HISTORY — DX: Presence of spectacles and contact lenses: Z97.3

## 2023-04-29 HISTORY — PX: CYSTOSCOPY: SHX5120

## 2023-04-29 HISTORY — PX: TOTAL LAPAROSCOPIC HYSTERECTOMY WITH BILATERAL SALPINGO OOPHORECTOMY: SHX6845

## 2023-04-29 HISTORY — DX: Anemia, unspecified: D64.9

## 2023-04-29 HISTORY — DX: Gastritis, unspecified, without bleeding: K29.70

## 2023-04-29 HISTORY — DX: Gastro-esophageal reflux disease without esophagitis: K21.9

## 2023-04-29 HISTORY — DX: Benign carcinoid tumor of the bronchus and lung: D3A.090

## 2023-04-29 LAB — TYPE AND SCREEN
ABO/RH(D): O POS
Antibody Screen: NEGATIVE

## 2023-04-29 SURGERY — HYSTERECTOMY, TOTAL, LAPAROSCOPIC, WITH BILATERAL SALPINGO-OOPHORECTOMY
Anesthesia: General | Site: Vagina

## 2023-04-29 MED ORDER — GABAPENTIN 300 MG PO CAPS
300.0000 mg | ORAL_CAPSULE | Freq: Three times a day (TID) | ORAL | Status: DC
  Administered 2023-04-29: 300 mg via ORAL

## 2023-04-29 MED ORDER — GABAPENTIN 300 MG PO CAPS
300.0000 mg | ORAL_CAPSULE | ORAL | Status: AC
  Administered 2023-04-29: 300 mg via ORAL

## 2023-04-29 MED ORDER — SCOPOLAMINE 1 MG/3DAYS TD PT72
1.0000 | MEDICATED_PATCH | TRANSDERMAL | Status: DC
  Administered 2023-04-29: 1.5 mg via TRANSDERMAL

## 2023-04-29 MED ORDER — PHENYLEPHRINE HCL (PRESSORS) 10 MG/ML IV SOLN
INTRAVENOUS | Status: DC | PRN
  Administered 2023-04-29 (×2): 80 ug via INTRAVENOUS

## 2023-04-29 MED ORDER — OXYCODONE HCL 5 MG PO TABS: 5.0000 mg | ORAL_TABLET | Freq: Once | ORAL | Status: DC | PRN

## 2023-04-29 MED ORDER — ACETAMINOPHEN 500 MG PO TABS
1000.0000 mg | ORAL_TABLET | Freq: Four times a day (QID) | ORAL | Status: DC
  Administered 2023-04-29 (×2): 1000 mg via ORAL

## 2023-04-29 MED ORDER — OXYCODONE HCL 5 MG PO TABS
ORAL_TABLET | ORAL | Status: AC
  Filled 2023-04-29: qty 1

## 2023-04-29 MED ORDER — SIMETHICONE 80 MG PO CHEW
CHEWABLE_TABLET | ORAL | Status: AC
  Filled 2023-04-29: qty 1

## 2023-04-29 MED ORDER — DEXTROSE-SODIUM CHLORIDE 5-0.45 % IV SOLN: INTRAVENOUS | Status: DC

## 2023-04-29 MED ORDER — FENTANYL CITRATE (PF) 100 MCG/2ML IJ SOLN
INTRAMUSCULAR | Status: AC
  Filled 2023-04-29: qty 2

## 2023-04-29 MED ORDER — WHITE PETROLATUM EX OINT
TOPICAL_OINTMENT | CUTANEOUS | Status: AC
  Filled 2023-04-29: qty 5

## 2023-04-29 MED ORDER — LACTATED RINGERS IV SOLN: INTRAVENOUS | Status: DC

## 2023-04-29 MED ORDER — FENTANYL CITRATE (PF) 100 MCG/2ML IJ SOLN
INTRAMUSCULAR | Status: DC | PRN
  Administered 2023-04-29: 50 ug via INTRAVENOUS
  Administered 2023-04-29 (×2): 25 ug via INTRAVENOUS
  Administered 2023-04-29: 50 ug via INTRAVENOUS

## 2023-04-29 MED ORDER — SCOPOLAMINE 1 MG/3DAYS TD PT72
MEDICATED_PATCH | TRANSDERMAL | Status: AC
  Filled 2023-04-29: qty 1

## 2023-04-29 MED ORDER — ONDANSETRON HCL 4 MG/2ML IJ SOLN: 4.0000 mg | Freq: Four times a day (QID) | INTRAMUSCULAR | Status: DC | PRN

## 2023-04-29 MED ORDER — ACETAMINOPHEN 10 MG/ML IV SOLN: 1000.0000 mg | Freq: Once | INTRAVENOUS | Status: DC | PRN

## 2023-04-29 MED ORDER — GABAPENTIN 300 MG PO CAPS
ORAL_CAPSULE | ORAL | Status: AC
  Filled 2023-04-29: qty 1

## 2023-04-29 MED ORDER — ONDANSETRON HCL 4 MG/2ML IJ SOLN: 4.0000 mg | Freq: Once | INTRAMUSCULAR | Status: DC | PRN

## 2023-04-29 MED ORDER — LISINOPRIL 20 MG PO TABS
20.0000 mg | ORAL_TABLET | Freq: Every day | ORAL | Status: DC
  Filled 2023-04-29: qty 1

## 2023-04-29 MED ORDER — DEXMEDETOMIDINE HCL IN NACL 80 MCG/20ML IV SOLN
INTRAVENOUS | Status: AC
  Filled 2023-04-29: qty 20

## 2023-04-29 MED ORDER — HEMOSTATIC AGENTS (NO CHARGE) OPTIME
TOPICAL | Status: DC | PRN
Start: 2023-04-29 — End: 2023-04-29
  Administered 2023-04-29: 1 via TOPICAL

## 2023-04-29 MED ORDER — ACETAMINOPHEN 500 MG PO TABS
ORAL_TABLET | ORAL | Status: AC
  Filled 2023-04-29: qty 2

## 2023-04-29 MED ORDER — SODIUM CHLORIDE 0.9 % IR SOLN
Status: DC | PRN
  Administered 2023-04-29: 1000 mL via INTRAVESICAL
  Administered 2023-04-29: 1000 mL

## 2023-04-29 MED ORDER — POVIDONE-IODINE 10 % EX SWAB: 2.0000 | Freq: Once | CUTANEOUS | Status: DC

## 2023-04-29 MED ORDER — GABAPENTIN 300 MG PO CAPS: 300.0000 mg | ORAL_CAPSULE | Freq: Three times a day (TID) | ORAL | 0 refills | Status: DC

## 2023-04-29 MED ORDER — ONDANSETRON HCL 4 MG PO TABS: 4.0000 mg | ORAL_TABLET | Freq: Four times a day (QID) | ORAL | Status: DC | PRN

## 2023-04-29 MED ORDER — ALBUTEROL SULFATE HFA 108 (90 BASE) MCG/ACT IN AERS: 2.0000 | INHALATION_SPRAY | Freq: Four times a day (QID) | RESPIRATORY_TRACT | Status: DC | PRN

## 2023-04-29 MED ORDER — PROPOFOL 10 MG/ML IV BOLUS
INTRAVENOUS | Status: DC | PRN
  Administered 2023-04-29: 130 mg via INTRAVENOUS

## 2023-04-29 MED ORDER — OXYCODONE HCL 5 MG PO TABS: 5.0000 mg | ORAL_TABLET | ORAL | 0 refills | Status: DC | PRN

## 2023-04-29 MED ORDER — OXYCODONE HCL 5 MG PO TABS
5.0000 mg | ORAL_TABLET | ORAL | Status: DC | PRN
  Administered 2023-04-29: 5 mg via ORAL

## 2023-04-29 MED ORDER — KETOROLAC TROMETHAMINE 30 MG/ML IJ SOLN
30.0000 mg | Freq: Four times a day (QID) | INTRAMUSCULAR | Status: DC
  Administered 2023-04-29: 30 mg via INTRAVENOUS

## 2023-04-29 MED ORDER — CEFAZOLIN SODIUM-DEXTROSE 2-4 GM/100ML-% IV SOLN
INTRAVENOUS | Status: AC
  Filled 2023-04-29: qty 100

## 2023-04-29 MED ORDER — EPHEDRINE SULFATE (PRESSORS) 50 MG/ML IJ SOLN
INTRAMUSCULAR | Status: DC | PRN
  Administered 2023-04-29 (×3): 5 mg via INTRAVENOUS

## 2023-04-29 MED ORDER — IBUPROFEN 600 MG PO TABS: 600.0000 mg | ORAL_TABLET | Freq: Four times a day (QID) | ORAL | 0 refills | Status: DC

## 2023-04-29 MED ORDER — BUPIVACAINE HCL (PF) 0.25 % IJ SOLN
INTRAMUSCULAR | Status: DC | PRN
  Administered 2023-04-29: 18 mL

## 2023-04-29 MED ORDER — SIMETHICONE 80 MG PO CHEW
80.0000 mg | CHEWABLE_TABLET | Freq: Four times a day (QID) | ORAL | Status: DC | PRN
  Administered 2023-04-29: 80 mg via ORAL

## 2023-04-29 MED ORDER — ACETAMINOPHEN 500 MG PO TABS
1000.0000 mg | ORAL_TABLET | ORAL | Status: AC
  Administered 2023-04-29: 1000 mg via ORAL

## 2023-04-29 MED ORDER — OXYCODONE HCL 5 MG/5ML PO SOLN: 5.0000 mg | Freq: Once | ORAL | Status: DC | PRN

## 2023-04-29 MED ORDER — HYDROMORPHONE HCL 1 MG/ML IJ SOLN: 1.0000 mg | INTRAMUSCULAR | Status: DC | PRN

## 2023-04-29 MED ORDER — ROCURONIUM BROMIDE 100 MG/10ML IV SOLN
INTRAVENOUS | Status: DC | PRN
  Administered 2023-04-29: 60 mg via INTRAVENOUS
  Administered 2023-04-29 (×3): 10 mg via INTRAVENOUS

## 2023-04-29 MED ORDER — BUSPIRONE HCL 5 MG PO TABS
5.0000 mg | ORAL_TABLET | Freq: Two times a day (BID) | ORAL | Status: DC
  Filled 2023-04-29: qty 1

## 2023-04-29 MED ORDER — DEXAMETHASONE SODIUM PHOSPHATE 4 MG/ML IJ SOLN
INTRAMUSCULAR | Status: DC | PRN
  Administered 2023-04-29: 8 mg via INTRAVENOUS

## 2023-04-29 MED ORDER — BUPIVACAINE HCL 0.5 % IJ SOLN
INTRAMUSCULAR | Status: DC | PRN
  Administered 2023-04-29: 15 mL

## 2023-04-29 MED ORDER — LORAZEPAM 0.5 MG PO TABS: 0.5000 mg | ORAL_TABLET | Freq: Every day | ORAL | Status: DC

## 2023-04-29 MED ORDER — ALUM & MAG HYDROXIDE-SIMETH 200-200-20 MG/5ML PO SUSP: 30.0000 mL | ORAL | Status: DC | PRN

## 2023-04-29 MED ORDER — SUGAMMADEX SODIUM 200 MG/2ML IV SOLN
INTRAVENOUS | Status: DC | PRN
  Administered 2023-04-29: 200 mg via INTRAVENOUS

## 2023-04-29 MED ORDER — KETOROLAC TROMETHAMINE 30 MG/ML IJ SOLN
INTRAMUSCULAR | Status: DC | PRN
  Administered 2023-04-29: 30 mg via INTRAVENOUS

## 2023-04-29 MED ORDER — MIDAZOLAM HCL 2 MG/2ML IJ SOLN
INTRAMUSCULAR | Status: DC | PRN
  Administered 2023-04-29: 2 mg via INTRAVENOUS

## 2023-04-29 MED ORDER — MENTHOL 3 MG MT LOZG: 1.0000 | LOZENGE | OROMUCOSAL | Status: DC | PRN

## 2023-04-29 MED ORDER — CEFAZOLIN SODIUM-DEXTROSE 2-4 GM/100ML-% IV SOLN
2.0000 g | INTRAVENOUS | Status: AC
  Administered 2023-04-29: 2 g via INTRAVENOUS

## 2023-04-29 MED ORDER — IBUPROFEN 200 MG PO TABS: 600.0000 mg | ORAL_TABLET | Freq: Four times a day (QID) | ORAL | Status: DC

## 2023-04-29 MED ORDER — PANTOPRAZOLE SODIUM 40 MG PO TBEC: 40.0000 mg | DELAYED_RELEASE_TABLET | Freq: Every day | ORAL | Status: DC

## 2023-04-29 MED ORDER — FENTANYL CITRATE (PF) 100 MCG/2ML IJ SOLN: 25.0000 ug | INTRAMUSCULAR | Status: DC | PRN

## 2023-04-29 MED ORDER — KETOROLAC TROMETHAMINE 30 MG/ML IJ SOLN
INTRAMUSCULAR | Status: AC
  Filled 2023-04-29: qty 1

## 2023-04-29 MED ORDER — LIDOCAINE HCL (CARDIAC) PF 100 MG/5ML IV SOSY
PREFILLED_SYRINGE | INTRAVENOUS | Status: DC | PRN
  Administered 2023-04-29: 50 mg via INTRAVENOUS

## 2023-04-29 MED ORDER — BISACODYL 10 MG RE SUPP: 10.0000 mg | Freq: Every day | RECTAL | Status: DC | PRN

## 2023-04-29 MED ORDER — 0.9 % SODIUM CHLORIDE (POUR BTL) OPTIME
TOPICAL | Status: DC | PRN
  Administered 2023-04-29: 500 mL

## 2023-04-29 MED ORDER — MIDAZOLAM HCL 2 MG/2ML IJ SOLN
INTRAMUSCULAR | Status: AC
  Filled 2023-04-29: qty 2

## 2023-04-29 SURGICAL SUPPLY — 82 items
ADH SKN CLS APL DERMABOND .7 (GAUZE/BANDAGES/DRESSINGS) ×3
APL SRG 38 LTWT LNG FL B (MISCELLANEOUS) ×3
APL SWBSTK 6 STRL LF DISP (MISCELLANEOUS) ×3
APPLICATOR ARISTA FLEXITIP XL (MISCELLANEOUS) IMPLANT
APPLICATOR COTTON TIP 6 STRL (MISCELLANEOUS) IMPLANT
APPLICATOR COTTON TIP 6IN STRL (MISCELLANEOUS) ×3
BARRIER ADHS 3X4 INTERCEED (GAUZE/BANDAGES/DRESSINGS) IMPLANT
BLADE SURG 15 STRL LF DISP TIS (BLADE) IMPLANT
BLADE SURG 15 STRL SS (BLADE) ×3
BRR ADH 4X3 ABS CNTRL BYND (GAUZE/BANDAGES/DRESSINGS)
CABLE HIGH FREQUENCY MONO STRZ (ELECTRODE) IMPLANT
CATH ROBINSON RED A/P 16FR (CATHETERS) ×4 IMPLANT
CLOTH BEACON ORANGE TIMEOUT ST (SAFETY) ×4 IMPLANT
COVER BACK TABLE 60X90IN (DRAPES) IMPLANT
COVER MAYO STAND STRL (DRAPES) ×4 IMPLANT
DERMABOND ADVANCED .7 DNX12 (GAUZE/BANDAGES/DRESSINGS) ×4 IMPLANT
DEVICE SUTURE ENDOST 10MM (ENDOMECHANICALS) IMPLANT
DRAPE SURG IRRIG POUCH 19X23 (DRAPES) ×4 IMPLANT
DRSG OPSITE POSTOP 4X10 (GAUZE/BANDAGES/DRESSINGS) IMPLANT
DURAPREP 26ML APPLICATOR (WOUND CARE) ×4 IMPLANT
GAUZE 4X4 16PLY ~~LOC~~+RFID DBL (SPONGE) ×4 IMPLANT
GAUZE STRIP PACKING 2INX5YD (MISCELLANEOUS) ×4 IMPLANT
GLOVE BIO SURGEON STRL SZ8 (GLOVE) ×4 IMPLANT
GLOVE BIOGEL PI IND STRL 6.5 (GLOVE) ×4 IMPLANT
GLOVE BIOGEL PI IND STRL 7.0 (GLOVE) ×4 IMPLANT
GLOVE BIOGEL PI IND STRL 8 (GLOVE) ×4 IMPLANT
GLOVE ORTHO TXT STRL SZ7.5 (GLOVE) ×8 IMPLANT
GOWN STRL REUS W/TWL LRG LVL3 (GOWN DISPOSABLE) ×4 IMPLANT
GOWN STRL REUS W/TWL XL LVL3 (GOWN DISPOSABLE) ×8 IMPLANT
HEMOSTAT ARISTA ABSORB 3G PWDR (HEMOSTASIS) IMPLANT
IRRIG SUCT STRYKERFLOW 2 WTIP (MISCELLANEOUS) ×3
IRRIGATION SUCT STRKRFLW 2 WTP (MISCELLANEOUS) ×4 IMPLANT
KIT PINK PAD W/HEAD ARE REST (MISCELLANEOUS) ×3
KIT PINK PAD W/HEAD ARM REST (MISCELLANEOUS) ×4 IMPLANT
KIT TURNOVER CYSTO (KITS) ×4 IMPLANT
NDL HYPO 22X1.5 SAFETY MO (MISCELLANEOUS) IMPLANT
NDL INSUFFLATION 14GA 120MM (NEEDLE) ×4 IMPLANT
NDL MAYO CATGUT SZ4 TPR NDL (NEEDLE) IMPLANT
NDL SPNL 22GX3.5 QUINCKE BK (NEEDLE) ×4 IMPLANT
NEEDLE HYPO 22X1.5 SAFETY MO (MISCELLANEOUS)
NEEDLE INSUFFLATION 14GA 120MM (NEEDLE) ×3
NEEDLE MAYO CATGUT SZ4 (NEEDLE)
NEEDLE SPNL 22GX3.5 QUINCKE BK (NEEDLE) ×3
NS IRRIG 1000ML POUR BTL (IV SOLUTION) ×4 IMPLANT
OCCLUDER COLPOPNEUMO (BALLOONS) ×4 IMPLANT
PACK LAPAROSCOPY BASIN (CUSTOM PROCEDURE TRAY) ×4 IMPLANT
PACK VAGINAL WOMENS (CUSTOM PROCEDURE TRAY) ×4 IMPLANT
PACKING VAGINAL (PACKING) IMPLANT
PROTECTOR NERVE ULNAR (MISCELLANEOUS) ×8 IMPLANT
SCISSORS LAP 5X35 DISP (ENDOMECHANICALS) IMPLANT
SET IRRIG Y TYPE TUR BLADDER L (SET/KITS/TRAYS/PACK) IMPLANT
SET TRI-LUMEN FLTR TB AIRSEAL (TUBING) ×4 IMPLANT
SHEARS HARMONIC 36 ACE (MISCELLANEOUS) IMPLANT
SLEEVE SCD COMPRESS KNEE MED (STOCKING) ×4 IMPLANT
SPIKE FLUID TRANSFER (MISCELLANEOUS) IMPLANT
SURGILUBE 2OZ TUBE FLIPTOP (MISCELLANEOUS) IMPLANT
SUT ENDO VLOC 180-0-8IN (SUTURE) IMPLANT
SUT PROLENE 1 CTX 30 8455H (SUTURE) IMPLANT
SUT SILK 0 SH 30 (SUTURE) IMPLANT
SUT VIC AB 0 CT1 36 (SUTURE) IMPLANT
SUT VIC AB 2-0 CT1 (SUTURE) IMPLANT
SUT VIC AB 2-0 CT2 27 (SUTURE) ×16 IMPLANT
SUT VIC AB 3-0 CT1 27 (SUTURE)
SUT VIC AB 3-0 CT1 TAPERPNT 27 (SUTURE) IMPLANT
SUT VIC AB 3-0 SH 27 (SUTURE)
SUT VIC AB 3-0 SH 27X BRD (SUTURE) IMPLANT
SUT VIC AB 4-0 PS2 27 (SUTURE) ×4 IMPLANT
SUT VICRYL 0 UR6 27IN ABS (SUTURE) ×4 IMPLANT
SUT VLOC 180 0 9IN GS21 (SUTURE) ×4 IMPLANT
SYR 10ML LL (SYRINGE) ×4 IMPLANT
SYR 50ML LL SCALE MARK (SYRINGE) ×4 IMPLANT
SYR CONTROL 10ML LL (SYRINGE) ×4 IMPLANT
TIP UTERINE 5.1X6CM LAV DISP (MISCELLANEOUS) IMPLANT
TIP UTERINE 6.7X10CM GRN DISP (MISCELLANEOUS) IMPLANT
TIP UTERINE 6.7X6CM WHT DISP (MISCELLANEOUS) IMPLANT
TIP UTERINE 6.7X8CM BLUE DISP (MISCELLANEOUS) IMPLANT
TOWEL OR 17X24 6PK STRL BLUE (TOWEL DISPOSABLE) ×4 IMPLANT
TRAY FOLEY W/BAG SLVR 14FR LF (SET/KITS/TRAYS/PACK) ×4 IMPLANT
TROCAR PORT AIRSEAL 5X120 (TROCAR) ×4 IMPLANT
TROCAR Z-THREAD BLADED 11X100M (TROCAR) ×4 IMPLANT
TROCAR Z-THREAD FIOS 5X100MM (TROCAR) ×4 IMPLANT
WARMER LAPAROSCOPE (MISCELLANEOUS) ×4 IMPLANT

## 2023-04-29 NOTE — Anesthesia Preprocedure Evaluation (Signed)
Anesthesia Evaluation  Patient identified by MRN, date of birth, ID band Patient awake    Reviewed: Allergy & Precautions, NPO status , Patient's Chart, lab work & pertinent test results, reviewed documented beta blocker date and time   History of Anesthesia Complications Negative for: history of anesthetic complications  Airway Mallampati: II  TM Distance: >3 FB     Dental no notable dental hx.    Pulmonary asthma , neg sleep apnea, COPD,  COPD inhaler, neg recent URI, former smoker S/p RML lobectomy for bronchial carcinoid   breath sounds clear to auscultation       Cardiovascular Exercise Tolerance: Good METS: 3 - Mets hypertension, (-) angina + CAD  (-) Past MI, (-) Cardiac Stents, (-) CABG and (-) Peripheral Vascular Disease  Rhythm:Regular Rate:Normal     Neuro/Psych neg Seizures  Anxiety        GI/Hepatic ,GERD  ,,(+) neg Cirrhosis        Endo/Other    Renal/GU Renal disease     Musculoskeletal  (+) Arthritis ,    Abdominal   Peds  Hematology   Anesthesia Other Findings   Reproductive/Obstetrics                              Anesthesia Physical Anesthesia Plan  ASA: 2  Anesthesia Plan: General   Post-op Pain Management:    Induction: Intravenous  PONV Risk Score and Plan: 3 and Ondansetron and Dexamethasone  Airway Management Planned: Oral ETT  Additional Equipment:   Intra-op Plan:   Post-operative Plan: Extubation in OR  Informed Consent: I have reviewed the patients History and Physical, chart, labs and discussed the procedure including the risks, benefits and alternatives for the proposed anesthesia with the patient or authorized representative who has indicated his/her understanding and acceptance.     Dental advisory given  Plan Discussed with: CRNA  Anesthesia Plan Comments:          Anesthesia Quick Evaluation

## 2023-04-29 NOTE — Op Note (Signed)
Preoperative diagnosis: Postmenopausal bleeding, Symptomatic myomatous uterus, cystocele, rectocele Postoperative diagnosis: Same  Procedure: Total laparoscopic hysterectomy, bilateral salpingooophorectomy, anterior and posterior repair, cystoscopy  Surgeon: Lavina Hamman M.D.  Assistant: Ellison Hughs, MD Anesthesia: Gen. Endotracheal tube  Findings: She had an enlarged uterus with multiple myomas.  One of the fibroids was coming off the right junction of the uterus and cervix.  Tubes and ovaries appeared normal.  She had a grade 1 cystocele and a grade 2-3 rectocele.  With cystoscopy the bladder was normal and both ureters were patent. Specimens: Uterus, tubes and ovaries for routine pathology  Estimated blood loss: 200 cc  Complications: None  Procedure in detail:  The patient was taken to the operating room and placed in the dorsosupine position. General anesthesia was induced. Arms were tucked to her sides and legs were placed in mobile stirrups. Abdomen perineum and vagina were then prepped and draped in usual sterile fashion. A 6cm RUMI with the small sized metal cup was then applied to the uterus and cervix for uterine manipulation and a Foley catheter was inserted. Infraumbilical skin was infiltrated with quarter percent Marcaine and a 1 cm vertical incision was made. A veress needle was inserted into the peritoneal cavity and placement confirmed by the water drop test an opening pressure of 3 mm of mercury. CO2 was insufflated to a pressure of 13 mm mercury and the veress needle was removed. A 5 mm trocar was then introduced with direct visualization with the laparoscope. A 7 mm air seal port was then also placed on the right side and a 10/11 trocar placed on the left side under direct visualization. Inspection revealed the above-mentioned findings. The distal right fallopian tube was grasped with a grasper from the left side. The Harmonic scalpel Ace was used to take down the  infundibulopelvic ligament and freed the tube and ovary.  The round ligament, utero-ovarian pedicle and broad ligament were then also taken down. The anterior peritoneum was incised across the anterior portion of the uterus to help release the bladder. Uterine artery artery was skeletonized and taken down with the harmonic scalpel Ace with adequate division and adequate hemostasis. A similar procedure was then performed on the patient's left side taking down the infundibulopelvic ligament and freeing the tube and ovary, then the round ligament, utero-ovarian pedicle, and broad ligament. Anterior peritoneum was incised across the anterior portion the uterus to meet the incision coming from the patient's right side, taking down some adhesions along the way. Uterine artery was skeletonized and taken down with the Harmonic Scalpel with adequate division and adequate hemostasis. I then began to detach the cervix from the vagina. The RUMI cup was pushed superiorly, the Harmonic scalpel was placed on the cup edge and a circumferential incision was made starting anteriorly into the vagina. This released the uterosacral ligaments as well as all attachments to the vagina.  In the process of doing this we encountered the 3 cm myoma at the right cervical uterine junction.  I grasped this and we were able to come around it with the harmonic scalpel from the right side and included with the uterine specimen.  The remainder of the cervicovaginal junction was taken down with harmonic scalpel to completely free the cervix.  At this point all pedicles appeared to be hemostatic and there was no other pathology noted.   Attention was turned vaginally.  The uterus was pulled to the introitus and a Bonano speculum was placed posteriorly.  Deaver retractor was used  anteriorly.  The uterus was then serially grasped and morcellated to remove it from the vagina.  The vaginal cuff appeared to be hemostatic.  The Bonano speculum was removed  and a smaller weighted speculum was placed in the vagina. The vaginal cuff was grasped with 2 Allis clamps anteriorly. The small cystocele was then mobilized by dissecting anteriorly in the midline from the vaginal cuff to within about 2 cm of the urethral meatus in the midline. Cystocele was mobilized laterally. Cystocele was then reduced with 3 interrupted sutures of 2-0 Vicryl with adequate reduction. A small amount of excess vaginal tissue was removed and the incision was closed with running locking 2-0 Vicryl with adequate closure and adequate hemostasis.  The vaginal cuff was then closed with a running locking 2-0 Vicryl in a vertical fashion with adequate closure and hemostasis.  Attention was now turned to the rectocele repair. The hymenal ring was grasped with 2 Allis clamps at a distance that when brought together would still allow two fingers to  easily pass into the vagina. A horizontal piece of tissue was removed. The posterior vaginal mucosa was then dissected in the midline from the hymenal ring to the vaginal apex. Rectocele was then mobilized bilaterally sharply and bluntly. The rectocele was then reduced with interrupted sutures of 2-0 Vicryl after a rectal exam confirmed this was all rectocele. This achieved good reduction. A repeat rectal exam confirmed good reduction of the rectocele. Excess vaginal mucosa was then removed sharply. Vagina was closed from the vaginal apex to the hymenal ring with running locking 2-0 Vicryl.  This achieved adequate closure and adequate hemostasis.  Cystoscopy was then performed after the Foley catheter was removed.  This used a 70 degree cystoscope.  200 cc of fluid was instilled.  The bladder was normal and urine was seen to flow freely from each ureteral orifice.  The cystoscope was removed and the Foley catheter was replaced.  The vagina was then packed with 2 inch gauze coated with lubricant.   Attention was now turned back to the abdomen.  The scrub tech  and myself changed gown and gloves.  The abdomen was reinsufflated with CO2.  The pelvis was inspected and irrigated.  Small amounts of bleeding were controlled with the harmonic scalpel.  The entire raw peritoneal bed was coated with Arista with adequate hemostasis.  The 10/11 disposable trocar was removed and a deep suture of 0 Vicryl was placed to help reapproximate fascia.  All gas was allowed to deflate and the remaining trocars were removed.  Skin incisions were closed with interrupted subcuticular sutures of 4-0 Vicryl followed by Dermabond.  The patient tolerated the procedure well. She was awakened and taken to the recovery in stable condition. Counts were correct, she had PAS hose on throughout the procedure. She received Ancef 2 g for surgical prophylaxis.

## 2023-04-29 NOTE — Transfer of Care (Signed)
Immediate Anesthesia Transfer of Care Note  Patient: Shannon [name]  Procedure(s) Performed: TOTAL LAPAROSCOPIC HYSTERECTOMY WITH BILATERAL SALPINGO OOPHORECTOMY (Bilateral: Abdomen) ANTERIOR (CYSTOCELE) AND POSTERIOR REPAIR (RECTOCELE) (Vagina ) CYSTOSCOPY (Bladder)  Patient Location: PACU  Anesthesia Type:General  Level of Consciousness: awake and patient cooperative  Airway & Oxygen Therapy: Patient Spontanous Breathing and Patient connected to nasal cannula oxygen  Post-op Assessment: Report given to RN and Post -op Vital signs reviewed and stable  Post vital signs: Reviewed and stable  Last Vitals:  Vitals Value Taken Time  BP 101/66 04/29/23 1101  Temp    Pulse 74 04/29/23 1103  Resp 15 04/29/23 1103  SpO2 96 % 04/29/23 1103  Vitals shown include unfiled device data.  Last Pain:  Vitals:   04/29/23 0558  TempSrc: Oral  PainSc: 0-No pain      Patients Stated Pain Goal: 4 (04/29/23 0558)  Complications: No notable events documented.

## 2023-04-29 NOTE — Progress Notes (Signed)
Post-op check, s/p TLH, BSO, A&P repair  Doing well, mild pain, no n/v, had ambulated, tol PO Afeb, VSS Abd- soft, incisions intact Foley and vaginal packing removed  Meeting postop milestones, d/c home after voids

## 2023-04-29 NOTE — Interval H&P Note (Signed)
History and Physical Interval Note:  04/29/2023 7:15 AM  Shannon [name]  has presented today for surgery, with the diagnosis of postmenopausal bleeding.  The various methods of treatment have been discussed with the patient and family. After consideration of risks, benefits and other options for treatment, the patient has consented to  Procedure(s): TOTAL LAPAROSCOPIC HYSTERECTOMY WITH BILATERAL SALPINGO OOPHORECTOMY (Bilateral) ANTERIOR (CYSTOCELE) AND POSTERIOR REPAIR (RECTOCELE) (N/A) CYSTOSCOPY (N/A) as a surgical intervention.  The patient's history has been reviewed, patient examined, no change in status, stable for surgery.  I have reviewed the patient's chart and labs.  Questions were answered to the patient's satisfaction.     Leighton Roach Quentin Strebel

## 2023-04-29 NOTE — Discharge Instructions (Signed)
Routine instructions for laparoscopic hysterectomy, and A&P repair

## 2023-04-29 NOTE — Anesthesia Postprocedure Evaluation (Signed)
Anesthesia Post Note  Patient: Shannon [name]  Procedure(s) Performed: TOTAL LAPAROSCOPIC HYSTERECTOMY WITH BILATERAL SALPINGO OOPHORECTOMY (Bilateral: Abdomen) ANTERIOR (CYSTOCELE) AND POSTERIOR REPAIR (RECTOCELE) (Vagina ) CYSTOSCOPY (Bladder)     Patient location during evaluation: PACU Anesthesia Type: General Level of consciousness: awake and alert Pain management: pain level controlled Vital Signs Assessment: post-procedure vital signs reviewed and stable Respiratory status: spontaneous breathing, nonlabored ventilation, respiratory function stable and patient connected to nasal cannula oxygen Cardiovascular status: blood pressure returned to baseline and stable Postop Assessment: no apparent nausea or vomiting Anesthetic complications: no   No notable events documented.  Last Vitals:  Vitals:   04/29/23 1208 04/29/23 1301  BP: 123/65 (!) 124/51  Pulse: 81 90  Resp: 16 14  Temp:  36.7 C  SpO2: 99% 98%    Last Pain:  Vitals:   04/29/23 1301  TempSrc: Oral  PainSc:                  Mariann Barter

## 2023-04-29 NOTE — Anesthesia Procedure Notes (Signed)
Procedure Name: Intubation Date/Time: 04/29/2023 7:51 AM  Performed by: Earmon Phoenix, CRNAPre-anesthesia Checklist: Suction available, Patient identified, Emergency Drugs available, Patient being monitored and Timeout performed Patient Re-evaluated:Patient Re-evaluated prior to induction Oxygen Delivery Method: Circle system utilized Preoxygenation: Pre-oxygenation with 100% oxygen Induction Type: IV induction Ventilation: Mask ventilation without difficulty Laryngoscope Size: Mac and 3 Grade View: Grade I Tube type: Oral Tube size: 7.0 mm Number of attempts: 1 Airway Equipment and Method: Stylet Placement Confirmation: ETT inserted through vocal cords under direct vision, positive ETCO2 and breath sounds checked- equal and bilateral Secured at: 21 cm Tube secured with: Tape Dental Injury: Teeth and Oropharynx as per pre-operative assessment

## 2023-04-30 ENCOUNTER — Encounter (HOSPITAL_BASED_OUTPATIENT_CLINIC_OR_DEPARTMENT_OTHER): Payer: Self-pay | Admitting: Obstetrics and Gynecology

## 2023-05-03 ENCOUNTER — Ambulatory Visit: Payer: BC Managed Care – PPO | Admitting: Physician Assistant

## 2023-05-03 LAB — SURGICAL PATHOLOGY

## 2023-05-05 NOTE — Discharge Summary (Signed)
Physician Discharge Summary  Patient ID: Shannon [name] MRN: [id number] DOB/AGE: 31-Jul-1960 62 y.o.  Admit date: 04/29/2023 Discharge date: 04/29/2023  Admission Diagnoses:  Symptomatic uterine leiomyoma, cystocele, rectocele  Discharge Diagnoses: Same Principal Problem:   Uterine leiomyoma Active Problems:   S/P laparoscopic hysterectomy   Discharged Condition: good  Hospital Course: Pt admitted and underwent TLH with vaginal closure of cuff, A&P repair and cystoscopy, no complications.  Post-op she did well, met milestones afternoon of surgery, vaginal packing and foley were removed, she was able to void and was discharged home   Discharge Exam: Blood pressure 138/74, pulse 89, temperature 98.2 F (36.8 C), resp. rate 16, height 5' 5.5" (1.664 m), weight 81.2 kg, SpO2 95%. General appearance: alert  Disposition: Discharge disposition: 01-Home or Self Care       Discharge Instructions     Call MD for:  persistant dizziness or light-headedness   Complete by: As directed    Call MD for:  persistant nausea and vomiting   Complete by: As directed    Call MD for:  severe uncontrolled pain   Complete by: As directed    Call MD for:  temperature >100.4   Complete by: As directed    Diet - low sodium heart healthy   Complete by: As directed    Increase activity slowly   Complete by: As directed    Lifting restrictions   Complete by: As directed    10 lbs   Sexual Activity Restrictions   Complete by: As directed    Pelvic rest      Allergies as of 04/29/2023       Reactions   Hydrocodone Nausea Only, Other (See Comments)   "terrible headache"   Pravastatin Other (See Comments)   Muscle/joint pain   Levaquin [levofloxacin In D5w] Nausea And Vomiting      Other    Dog Dander         Medication List     STOP taking these medications    traMADol 50 MG tablet Commonly known as: ULTRAM       TAKE these medications    acetaminophen 650 MG CR  tablet Commonly known as: TYLENOL Take 650 mg by mouth every 8 (eight) hours as needed for pain.   albuterol 108 (90 Base) MCG/ACT inhaler Commonly known as: VENTOLIN HFA INHALE 2 PUFFS INTO THE LUNGS EVERY 6 HOURS AS NEEDED FOR WHEEZING OR SHORTNESS OF BREATH   B-12 2500 MCG Tabs Take 2,500 mcg by mouth daily.   busPIRone 10 MG tablet Commonly known as: BUSPAR TAKE 1/2 TABLET BY MOUTH TWICE DAILY   calcium carbonate 500 MG chewable tablet Commonly known as: TUMS - dosed in mg elemental calcium Chew 1 tablet by mouth daily as needed for indigestion or heartburn.   cyclobenzaprine 10 MG tablet Commonly known as: FLEXERIL Take 10 mg by mouth 3 (three) times daily as needed for muscle spasms.   ferrous sulfate 324 MG Tbec Take 324 mg by mouth daily with breakfast.   gabapentin 300 MG capsule Commonly known as: NEURONTIN Take 300 mg by mouth at bedtime as needed (pain). What changed: Another medication with the same name was added. Make sure you understand how and when to take each.   gabapentin 300 MG capsule Commonly known as: NEURONTIN Take 1 capsule (300 mg total) by mouth 3 (three) times daily for 2 days. What changed: You were already taking a medication with the same name, and this prescription was added.  Make sure you understand how and when to take each.   ibuprofen 600 MG tablet Commonly known as: ADVIL Take 1 tablet (600 mg total) by mouth every 6 (six) hours.   lisinopril 20 MG tablet Commonly known as: ZESTRIL TAKE 1 TABLET(20 MG) BY MOUTH DAILY   LORazepam 1 MG tablet Commonly known as: ATIVAN TAKE 1 TABLET(1 MG) BY MOUTH AT BEDTIME   meloxicam 7.5 MG tablet Commonly known as: MOBIC Take 7.5 mg by mouth daily.   omeprazole 40 MG capsule Commonly known as: PRILOSEC Take 1 capsule (40 mg total) by mouth 2 (two) times daily. Take 30-60 minutes before meals   oxyCODONE 5 MG immediate release tablet Commonly known as: Oxy IR/ROXICODONE Take 1 tablet (5  mg total) by mouth every 4 (four) hours as needed for severe pain.   Probiotic Daily Caps Take by mouth daily. Over the counter. Use as directed   Repatha SureClick 140 MG/ML Soaj Generic drug: Evolocumab INJECT 1 ML UNDER THE SKIN ONCE EVERY 2 WEEKS   Vitamin D-3 125 MCG (5000 UT) Tabs Take 5,000 Units by mouth daily at 12 noon.        Follow-up Information     Monea Pesantez, MD. Schedule an appointment as soon as possible for a visit in 2 week(s).   Specialty: Obstetrics and Gynecology Contact information: [address], [address] [place] [address] [phone]                 Signed: Leighton Roach Marishka Rentfrow 05/05/2023, 7:20 AM

## 2023-05-10 ENCOUNTER — Other Ambulatory Visit: Payer: Self-pay | Admitting: Gastroenterology

## 2023-05-18 ENCOUNTER — Encounter
Payer: BC Managed Care – PPO | Attending: Physical Medicine and Rehabilitation | Admitting: Physical Medicine and Rehabilitation

## 2023-05-18 ENCOUNTER — Encounter: Payer: Self-pay | Admitting: Physical Medicine and Rehabilitation

## 2023-05-18 VITALS — BP 108/67 | HR 75 | Ht 65.5 in | Wt 180.0 lb

## 2023-05-18 DIAGNOSIS — K589 Irritable bowel syndrome without diarrhea: Secondary | ICD-10-CM | POA: Diagnosis present

## 2023-05-18 MED ORDER — TOPIRAMATE 25 MG PO TABS: 25.0000 mg | ORAL_TABLET | Freq: Every evening | ORAL | 3 refills | Status: DC | PRN

## 2023-05-18 MED ORDER — PREDNISONE 5 MG PO TABS: 5.0000 mg | ORAL_TABLET | Freq: Every day | ORAL | 3 refills | Status: DC | PRN

## 2023-05-18 NOTE — Progress Notes (Signed)
Subjective:    Patient ID: Shannon [name], female    DOB: 1961/02/03, 62 y.o.   MRN: [id number]  HPI Shannon [name] is a 62 year old woman who presents to establish care for pain.  1) Chronic pain -she has failed tylenol, tramadol, tylenol arthritis -she was on meloxicam for 5 years and developed an ulcer this year -the meloxicam controlled the medication unless she had a flare up -she tried turmeric and it did not help- she tried it when she went of the meloxicam and tried it for a month -she has diffuse pain -she followed with the rheumatologist -she feels diffuse muscle aches and joint pain  2) History of cancer: -she had surgery and this resulted in remission.   Pain Inventory Average Pain 8 Pain Right Now 2 My pain is intermittent, constant, sharp, dull, stabbing, and aching  In the last 24 hours, has pain interfered with the following? General activity 1 Relation with others 0 Enjoyment of life 1 What TIME of day is your pain at its worst? evening Sleep (in general) Good  Pain is worse with: walking, bending, sitting, inactivity, standing, and some activites Pain improves with: rest, heat/ice, therapy/exercise, medication, and injections Relief from Meds: 9  walk without assistance how many minutes can you walk? 30 minutes ability to climb steps?  yes do you drive?  yes  retired I need assistance with the following:  shopping  bowel control problems anxiety  Any changes since last visit?  no  Any changes since last visit?  yes    Family History  Problem Relation Age of Onset   Heart disease Mother    COPD Mother    Emphysema Mother    Fibromyalgia Mother    Arthritis Mother    Diabetes Father    Healthy Son    Healthy Daughter    Colon cancer Neg Hx    Colon polyps Neg Hx    Esophageal cancer Neg Hx    Rectal cancer Neg Hx    Stomach cancer Neg Hx    Social History   Socioeconomic History   Marital status: Married    Spouse name: Not on  file   Number of children: Not on file   Years of education: Not on file   Highest education level: GED or equivalent  Occupational History   Not on file  Tobacco Use   Smoking status: Former    Current packs/day: 0.00    Average packs/day: 1 pack/day for 33.0 years (33.0 ttl pk-yrs)    Types: Cigarettes    Start date: 1    Quit date: 2008    Years since quitting: 16.8    Passive exposure: Past   Smokeless tobacco: Never  Vaping Use   Vaping status: Never Used  Substance and Sexual Activity   Alcohol use: Not Currently   Drug use: Never   Sexual activity: Not Currently    Birth control/protection: Post-menopausal  Other Topics Concern   Not on file  Social History Narrative   Not on file   Social Determinants of Health   Financial Resource Strain: Low Risk  (11/12/2022)   Overall Financial Resource Strain (CARDIA)    Difficulty of Paying Living Expenses: Not hard at all  Food Insecurity: No Food Insecurity (11/12/2022)   Hunger Vital Sign    Worried About Running Out of Food in the Last Year: Never true    Ran Out of Food in the Last Year: Never true  Transportation  Needs: No Transportation Needs (11/12/2022)   PRAPARE - Administrator, Civil Service (Medical): No    Lack of Transportation (Non-Medical): No  Physical Activity: Sufficiently Active (11/12/2022)   Exercise Vital Sign    Days of Exercise per Week: 7 days    Minutes of Exercise per Session: 30 min  Stress: Stress Concern Present (11/12/2022)   [employer] of Occupational Health - Occupational Stress Questionnaire    Feeling of Stress : To some extent  Social Connections: Moderately Isolated (11/12/2022)   Social Connection and Isolation Panel [NHANES]    Frequency of Communication with Friends and Family: Three times a week    Frequency of Social Gatherings with Friends and Family: Once a week    Attends Religious Services: Never    Database administrator or Organizations: No    Attends  Engineer, structural: Not on file    Marital Status: Married   Past Surgical History:  Procedure Laterality Date   ABLATION     endometrial   ANTERIOR AND POSTERIOR REPAIR N/A 04/29/2023   Procedure: ANTERIOR (CYSTOCELE) AND POSTERIOR REPAIR (RECTOCELE);  Surgeon: Lavina Hamman, MD;  Location: [facility];  Service: Gynecology;  Laterality: N/A;   BIOPSY  10/10/2018   Procedure: BIOPSY;  Surgeon: Meridee Score Netty Starring., MD;  Location: [facility] ENDOSCOPY;  Service: Gastroenterology;;   BIOPSY  06/12/2019   Procedure: BIOPSY;  Surgeon: Lemar Lofty., MD;  Location: [facility] ENDOSCOPY;  Service: Gastroenterology;;   BIOPSY  09/30/2020   Procedure: BIOPSY;  Surgeon: Lemar Lofty., MD;  Location: [facility];  Service: Gastroenterology;;   BIOPSY  11/18/2022   Procedure: BIOPSY;  Surgeon: Lemar Lofty., MD;  Location: [facility] ENDOSCOPY;  Service: Gastroenterology;;   COLONOSCOPY     COLONOSCOPY WITH PROPOFOL N/A 11/18/2022   Procedure: COLONOSCOPY WITH PROPOFOL;  Surgeon: Lemar Lofty., MD;  Location: [facility] ENDOSCOPY;  Service: Gastroenterology;  Laterality: N/A;   CYSTOSCOPY N/A 04/29/2023   Procedure: CYSTOSCOPY;  Surgeon: Lavina Hamman, MD;  Location: [facility];  Service: Gynecology;  Laterality: N/A;   DOPPLER ECHOCARDIOGRAPHY     ENDOSCOPIC MUCOSAL RESECTION N/A 10/10/2018   Procedure: ENDOSCOPIC MUCOSAL RESECTION;  Surgeon: Meridee Score, Netty Starring., MD;  Location: [facility] ENDOSCOPY;  Service: Gastroenterology;  Laterality: N/A;   ESOPHAGOGASTRODUODENOSCOPY N/A 11/18/2022   Procedure: ESOPHAGOGASTRODUODENOSCOPY (EGD);  Surgeon: Lemar Lofty., MD;  Location: [facility]COPY;  Service: Gastroenterology;  Laterality: N/A;   ESOPHAGOGASTRODUODENOSCOPY  03/30/2023   normal   EUS N/A 07/18/2018   Procedure: LOWER ENDOSCOPIC ULTRASOUND (EUS);  Surgeon: Lemar Lofty., MD;  Location: [facility] ENDOSCOPY;  Service:  Gastroenterology;  Laterality: N/A;   EUS N/A 10/10/2018   Procedure: LOWER ENDOSCOPIC ULTRASOUND (EUS);  Surgeon: Lemar Lofty., MD;  Location: [facility] ENDOSCOPY;  Service: Gastroenterology;  Laterality: N/A;   EUS N/A 06/12/2019   Procedure: LOWER ENDOSCOPIC ULTRASOUND (EUS);  Surgeon: Lemar Lofty., MD;  Location: [facility] ENDOSCOPY;  Service: Gastroenterology;  Laterality: N/A;   EUS N/A 09/30/2020   Procedure: LOWER ENDOSCOPIC ULTRASOUND (EUS);  Surgeon: Lemar Lofty., MD;  Location: [facility];  Service: Gastroenterology;  Laterality: N/A;   EUS N/A 11/18/2022   Procedure: LOWER ENDOSCOPIC ULTRASOUND (EUS);  Surgeon: Lemar Lofty., MD;  Location: [facility]COPY;  Service: Gastroenterology;  Laterality: N/A;   FLEXIBLE SIGMOIDOSCOPY N/A 07/18/2018   Procedure: FLEXIBLE SIGMOIDOSCOPY;  Surgeon: Meridee Score Netty Starring., MD;  Location: [facility] ENDOSCOPY;  Service: Gastroenterology;  Laterality: N/A;   FLEXIBLE  SIGMOIDOSCOPY N/A 10/10/2018   Procedure: FLEXIBLE SIGMOIDOSCOPY;  Surgeon: Meridee Score Netty Starring., MD;  Location: [facility] ENDOSCOPY;  Service: Gastroenterology;  Laterality: N/A;   FLEXIBLE SIGMOIDOSCOPY N/A 06/12/2019   Procedure: FLEXIBLE SIGMOIDOSCOPY;  Surgeon: Meridee Score Netty Starring., MD;  Location: [facility] ENDOSCOPY;  Service: Gastroenterology;  Laterality: N/A;   FLEXIBLE SIGMOIDOSCOPY N/A 09/30/2020   Procedure: FLEXIBLE SIGMOIDOSCOPY;  Surgeon: Meridee Score Netty Starring., MD;  Location: [facility];  Service: Gastroenterology;  Laterality: N/A;   HEMOSTASIS CLIP PLACEMENT  10/10/2018   Procedure: HEMOSTASIS CLIP PLACEMENT;  Surgeon: Lemar Lofty., MD;  Location: [facility] ENDOSCOPY;  Service: Gastroenterology;;   LUNG CANCER SURGERY  12/30/2018   right lun- mid lobe , dr gerhardt   POLYPECTOMY  11/18/2022   Procedure: POLYPECTOMY;  Surgeon: Lemar Lofty., MD;  Location: [facility] ENDOSCOPY;  Service: Gastroenterology;;   Gaspar Bidding DILATION N/A 11/18/2022    Procedure: Jacklyn Shell;  Surgeon: Lemar Lofty., MD;  Location: [facility] ENDOSCOPY;  Service: Gastroenterology;  Laterality: N/A;   SUBMUCOSAL LIFTING INJECTION  10/10/2018   Procedure: SUBMUCOSAL LIFTING INJECTION;  Surgeon: Meridee Score Netty Starring., MD;  Location: [facility] ENDOSCOPY;  Service: Gastroenterology;;   TEE WITHOUT CARDIOVERSION  02/2018   TONSILLECTOMY     TOTAL HIP ARTHROPLASTY Left 11/2021   TOTAL LAPAROSCOPIC HYSTERECTOMY WITH BILATERAL SALPINGO OOPHORECTOMY Bilateral 04/29/2023   Procedure: TOTAL LAPAROSCOPIC HYSTERECTOMY WITH BILATERAL SALPINGO OOPHORECTOMY;  Surgeon: Lavina Hamman, MD;  Location: [facility];  Service: Gynecology;  Laterality: Bilateral;   TUBAL LIGATION  1987   VIDEO ASSISTED THORACOSCOPY (VATS)/WEDGE RESECTION Right 12/30/2018   Procedure: VIDEO ASSISTED THORACOSCOPY (VATS), MINI THORACOTOMY, RIGHT MIDDLE LOBECTOMY WITH LYMPH NODE DISSECTION;  Surgeon: Delight Ovens, MD;  Location: [facility] OR;  Service: Thoracic;  Laterality: Right;   VIDEO BRONCHOSCOPY N/A 10/12/2018   Procedure: VIDEO BRONCHOSCOPY;  Surgeon: Delight Ovens, MD;  Location: [facility] OR;  Service: Thoracic;  Laterality: N/A;   VIDEO BRONCHOSCOPY N/A 12/30/2018   Procedure: VIDEO BRONCHOSCOPY;  Surgeon: Delight Ovens, MD;  Location: [facility] OR;  Service: Thoracic;  Laterality: N/A;   WISDOM TOOTH EXTRACTION     Past Medical History:  Diagnosis Date   Anemia    takes iron supplements, Pt states anemia is thought to be r/t a bleeding uterine polyp.   Anxiety    Follows w/ PCP, Dr. Harless Nakayama.   Arthritis    back, hips, shoulders, hands, knees   Asthma    Follows w/ pulmonology, Dr. [name].   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 [name].   Gastritis    Found on 10/2022 EGD. Following w/ Dr. Marina Goodell, GI.   GERD (gastroesophageal reflux disease)    History of kidney stones    passed   HLD (hyperlipidemia)    Follows w/ PCP, Dr. Harless Nakayama.   Hypertension    Follows w/ PCP, Benita Stabile @ [facility].   Pneumonia 02/2018   Rectal tumor    neuroendocrine carcinoma of rectum, follows w/ GI , Dr. Sherre Lain and Dr. Shirline Frees at [facility].   Seasonal allergies    Wears glasses    BP 108/67   Pulse 75   Ht 5' 5.5" (1.664  m)   Wt 180 lb (81.6 kg)   LMP  (LMP Unknown)   SpO2 98%   BMI 29.50 kg/m   Opioid Risk Score:   Fall Risk Score:  `1  Depression screen [facility] 2/9     05/18/2023    9:55 AM 04/19/2023   10:44 AM 11/16/2022    9:07 AM 07/08/2022    8:27 AM 11/19/2021    8:25 AM 10/22/2021    3:06 PM 09/11/2021    1:29 PM  Depression screen PHQ 2/9  Decreased Interest 0 0 0 0 0 0 0  Down, Depressed, Hopeless 0 0 0 0 0 0 0  PHQ - 2 Score 0 0 0 0 0 0 0  Altered sleeping 0 0       Tired, decreased energy 1 1       Change in appetite 0 0       Feeling bad or failure about yourself  0 0       Trouble concentrating 0 0       Moving slowly or fidgety/restless 0 0       Suicidal thoughts 0 0       PHQ-9 Score 1 1       Difficult doing work/chores  Not difficult at all         Review of Systems  Genitourinary:  Positive for urgency.       Incontinence   Hematological:  Bruises/bleeds easily.  Psychiatric/Behavioral:         Anxiety  All other systems reviewed and are negative.      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 IFC, NMES and TENS medically necessary Treatment Rx: Daily @ 30-40 minutes per treatment PRN. Zynex NexWave only, no substitutions. Treatment Goals: 1) To reduce and/or eliminate pain 2) To improve functional capacity and Activities of daily living 3) To reduce or prevent the need for oral medications 4) To improve circulation in the injured region 5) To decrease or prevent muscle spasm and muscle atrophy 6) To provide a self-management tool to the patient The patient has not sufficiently improved with conservative care. Numerous studies indexed by Medline and PubMed.gov have shown Neuromuscular, Interferential, and TENS stimulators to reduce pain, improve function, and reduce medication use in injured patients. Continued use of this evidence based, safe, drug free treatment is both reasonable and medically necessary at this time.   -Discussed Qutenza as an option for neuropathic pain control. Discussed that this is a capsaicin patch, stronger than capsaicin cream. Discussed that it is currently approved for diabetic peripheral neuropathy and post-herpetic neuralgia, but that it has also shown benefit in treating other forms of neuropathy. Provided patient with link to site to learn more about the patch: https://www.clark.biz/. Discussed that the patch would be placed in office and benefits usually last 3 months. Discussed that unintended exposure to capsaicin can cause severe irritation of eyes, mucous membranes, respiratory tract, and skin, but that Qutenza is a local treatment and does not have the systemic side effects of other nerve  medications. Discussed that there may be pain, itching, erythema, and decreased sensory function associated with the application of Qutenza. Side effects usually subside within 1 week. A cold pack of analgesic medications can help with these side  effects. Blood pressure can also be increased due to pain associated with administration of the patch.   Recommended natto  -Discussed current symptoms of pain and history of pain.  -Discussed benefits of exercise in reducing pain. -Discussed following foods that may reduce pain: 1) Ginger (especially studied for arthritis)- reduce leukotriene production to decrease inflammation 2) Blueberries- high in phytonutrients that decrease inflammation 3) Salmon- marine omega-3s reduce joint swelling and pain 4) Pumpkin seeds- reduce inflammation 5) dark chocolate- reduces inflammation 6) turmeric- reduces inflammation 7) tart cherries - reduce pain and stiffness 8) extra virgin olive oil - its compound olecanthal helps to block prostaglandins  9) chili peppers- can be eaten or applied topically via capsaicin 10) mint- helpful for headache, muscle aches, joint pain, and itching 11) garlic- reduces inflammation  Link to further information on diet for chronic pain: http://www.bray.com/   2) IBS: -discussed that she has constipation and diarrhea -food allergy testing ordered  3) Insomnia:  -topamax 25mg  HS PRN prescribed

## 2023-05-18 NOTE — Patient Instructions (Addendum)
Turmeric to reduce inflammation--can be used in cooking or taken as a supplement.  Benefits of turmeric:  -Highly anti-inflammatory  -Increases antioxidants  -Improves memory, attention, brain disease  -Lowers risk of heart disease  -May help prevent cancer  -Decreases pain  -Alleviates depression  -Delays aging and decreases risk of chronic disease  -Consume with black pepper to increase absorption   -Discussed following foods that may reduce pain: 1) Ginger (especially studied for arthritis)- reduce leukotriene production to decrease inflammation 2) Blueberries- high in phytonutrients that decrease inflammation 3) Salmon- marine omega-3s reduce joint swelling and pain 4) Pumpkin seeds- reduce inflammation 5) dark chocolate- reduces inflammation 6) turmeric- reduces inflammation 7) tart cherries - reduce pain and stiffness 8) extra virgin olive oil - its compound olecanthal helps to block prostaglandins  9) chili peppers- can be eaten or applied topically via capsaicin 10) mint- helpful for headache, muscle aches, joint pain, and itching 11) garlic- reduces inflammation  Link to further information on diet for chronic pain: http://www.bray.com/   Natto

## 2023-05-20 LAB — ALLERGENS (22) FOODS IGG
Casein IgG: 4.2 ug/mL — ABNORMAL HIGH (ref 0.0–1.9)
Cheese, Mold Type IgG: 11.5 ug/mL — ABNORMAL HIGH (ref 0.0–1.9)
Chicken IgG: <2 ug/mL (ref 0.0–1.9)
Chocolate/Cacao IgG: 2.5 ug/mL — ABNORMAL HIGH (ref 0.0–1.9)
Coffee IgG: 5.5 ug/mL — ABNORMAL HIGH (ref 0.0–1.9)
Corn IgG: 4.3 ug/mL — ABNORMAL HIGH (ref 0.0–1.9)
Egg, Whole IgG: 3.4 ug/mL — ABNORMAL HIGH (ref 0.0–1.9)
Green Bean IgG: 3.4 ug/mL — ABNORMAL HIGH (ref 0.0–1.9)
Haddock IgG: <2 ug/mL (ref 0.0–1.9)
Lamb IgG: <2 ug/mL (ref 0.0–1.9)
Oat IgG: 4.6 ug/mL — ABNORMAL HIGH (ref 0.0–1.9)
Onion IgG: 2.5 ug/mL — ABNORMAL HIGH (ref 0.0–1.9)
Peanut IgG: <2 ug/mL (ref 0.0–1.9)
Pork IgG: 3.7 ug/mL — ABNORMAL HIGH (ref 0.0–1.9)
Potato, White, IgG: 2 ug/mL — ABNORMAL HIGH (ref 0.0–1.9)
Rye IgG: 3.9 ug/mL — ABNORMAL HIGH (ref 0.0–1.9)
Shrimp IgG: <2 ug/mL (ref 0.0–1.9)
Soybean IgG: <2 ug/mL (ref 0.0–1.9)
Tomato IgG: 2.4 ug/mL — ABNORMAL HIGH (ref 0.0–1.9)
Wheat IgG: 3.7 ug/mL — ABNORMAL HIGH (ref 0.0–1.9)
Yeast IgG: <2 ug/mL (ref 0.0–1.9)

## 2023-05-27 LAB — HM DEXA SCAN

## 2023-05-28 ENCOUNTER — Encounter: Payer: Self-pay | Admitting: Physician Assistant

## 2023-05-31 ENCOUNTER — Ambulatory Visit (INDEPENDENT_AMBULATORY_CARE_PROVIDER_SITE_OTHER): Payer: BC Managed Care – PPO | Admitting: Physician Assistant

## 2023-05-31 ENCOUNTER — Encounter: Payer: Self-pay | Admitting: Physician Assistant

## 2023-05-31 VITALS — BP 134/82 | HR 78 | Temp 97.8°F | Ht 65.5 in | Wt 178.0 lb

## 2023-05-31 DIAGNOSIS — E559 Vitamin D deficiency, unspecified: Secondary | ICD-10-CM

## 2023-05-31 DIAGNOSIS — M160 Bilateral primary osteoarthritis of hip: Secondary | ICD-10-CM | POA: Diagnosis not present

## 2023-05-31 DIAGNOSIS — K2100 Gastro-esophageal reflux disease with esophagitis, without bleeding: Secondary | ICD-10-CM

## 2023-05-31 DIAGNOSIS — F419 Anxiety disorder, unspecified: Secondary | ICD-10-CM

## 2023-05-31 DIAGNOSIS — I10 Essential (primary) hypertension: Secondary | ICD-10-CM

## 2023-05-31 DIAGNOSIS — C7A090 Malignant carcinoid tumor of the bronchus and lung: Secondary | ICD-10-CM

## 2023-05-31 DIAGNOSIS — D508 Other iron deficiency anemias: Secondary | ICD-10-CM | POA: Diagnosis not present

## 2023-05-31 DIAGNOSIS — C7A026 Malignant carcinoid tumor of the rectum: Secondary | ICD-10-CM

## 2023-05-31 DIAGNOSIS — E782 Mixed hyperlipidemia: Secondary | ICD-10-CM

## 2023-05-31 MED ORDER — BUSPIRONE HCL 10 MG PO TABS: ORAL_TABLET | ORAL | 1 refills | Status: DC

## 2023-05-31 NOTE — Progress Notes (Signed)
Subjective:  Patient ID: Shannon [name], female    DOB: 1961-05-05  Age: 62 y.o. MRN: [id number]  Chief Complaint  Patient presents with   Medical Management of Chronic Issues    HPI Pt presents for follow up of hypertension.  The patient is tolerating the medication well without side effects. Compliance with treatment has been good; including taking medication as directed , maintains a healthy diet and regular exercise regimen , and following up as directed. Pt taking zestril 20mg  qd Denies chest pain/shortness of breath  Mixed hyperlipidemia  Pt presents with hyperlipidemia. Compliance with treatment has been good -The patient is compliant with medications, maintains a low cholesterol diet , follows up as directed , and maintains an exercise regimen . The patient denies experiencing any hypercholesterolemia related symptoms. She is currently taking repatha  Pt with GERD -- stable on omeprazole 40mg  qd  Pt with anxiety - symptoms stable on ativan and buspar  Pt with Vit  D def - is taking daily supplement - due for labwork  Pt with history of iron def anemia - has had a total hysterectomy - pathology was benign- had lots of bleeding which is assumed cause of iron def  Pt with carcinoid tumor of rectum and follows with Dr Meridee Score Pt with carcinoid bronchial adenoma - follows with Dr Shirline Frees       05/31/2023    8:29 AM 05/18/2023    9:55 AM 04/19/2023   10:44 AM 11/16/2022    9:07 AM 07/08/2022    8:27 AM  Depression screen PHQ 2/9  Decreased Interest 0 0 0 0 0  Down, Depressed, Hopeless 0 0 0 0 0  PHQ - 2 Score 0 0 0 0 0  Altered sleeping 0 0 0    Tired, decreased energy 0 1 1    Change in appetite 0 0 0    Feeling bad or failure about yourself  0 0 0    Trouble concentrating 0 0 0    Moving slowly or fidgety/restless 0 0 0    Suicidal thoughts 0 0 0    PHQ-9 Score 0 1 1    Difficult doing work/chores Not difficult at all  Not difficult at all           07/08/2022    8:27 AM 11/16/2022    9:07 AM 04/19/2023   10:44 AM 05/18/2023    9:54 AM 05/31/2023    8:29 AM  Fall Risk  Falls in the past year? 0 0 0 0 0  Was there an injury with Fall? 0 0 0 0 0  Fall Risk Category Calculator 0 0 0 0 0  Fall Risk Category (Retired) Low      (RETIRED) Patient Fall Risk Level Low fall risk      Patient at Risk for Falls Due to No Fall Risks History of fall(s) No Fall Risks  No Fall Risks  Fall risk Follow up Falls evaluation completed Falls evaluation completed Falls evaluation completed  Falls evaluation completed     CONSTITUTIONAL: Negative for chills, fatigue, fever, unintentional weight gain and unintentional weight loss.  E/N/T: Negative for ear pain, nasal congestion and sore throat.  CARDIOVASCULAR: Negative for chest pain, dizziness, palpitations and pedal edema.  RESPIRATORY: Negative for recent cough and dyspnea.  GASTROINTESTINAL: Negative for abdominal pain, acid reflux symptoms, constipation, diarrhea, nausea and vomiting.  MSK: Negative for arthralgias and myalgias.  INTEGUMENTARY: Negative for rash.  NEUROLOGICAL: Negative for dizziness and headaches.  PSYCHIATRIC: Negative for sleep disturbance and to question depression screen.  Negative for depression, negative for anhedonia.        Current Outpatient Medications:    acetaminophen (TYLENOL) 650 MG CR tablet, Take 650 mg by mouth every 8 (eight) hours as needed for pain., Disp: , Rfl:    albuterol (VENTOLIN HFA) 108 (90 Base) MCG/ACT inhaler, INHALE 2 PUFFS INTO THE LUNGS EVERY 6 HOURS AS NEEDED FOR WHEEZING OR SHORTNESS OF BREATH, Disp: 6.7 g, Rfl: 1   calcium carbonate (TUMS - DOSED IN MG ELEMENTAL CALCIUM) 500 MG chewable tablet, Chew 1 tablet by mouth daily as needed for indigestion or heartburn., Disp: , Rfl:    Cholecalciferol (VITAMIN D-3) 125 MCG (5000 UT) TABS, Take 5,000 Units by mouth daily at 12 noon., Disp: , Rfl:    Cyanocobalamin (B-12) 2500 MCG TABS, Take 2,500 mcg by  mouth daily., Disp: , Rfl:    cyclobenzaprine (FLEXERIL) 10 MG tablet, Take 10 mg by mouth 3 (three) times daily as needed for muscle spasms., Disp: , Rfl:    ferrous sulfate 324 MG TBEC, Take 324 mg by mouth daily with breakfast., Disp: , Rfl:    gabapentin (NEURONTIN) 300 MG capsule, Take 300 mg by mouth at bedtime as needed (pain)., Disp: , Rfl:    lisinopril (ZESTRIL) 20 MG tablet, TAKE 1 TABLET(20 MG) BY MOUTH DAILY, Disp: 90 tablet, Rfl: 1   LORazepam (ATIVAN) 1 MG tablet, TAKE 1 TABLET(1 MG) BY MOUTH AT BEDTIME, Disp: 90 tablet, Rfl: 0   omeprazole (PRILOSEC) 40 MG capsule, TAKE 1 CAPSULE BY MOUTH 2 TIMES DAILY. TAKE 30-60 MINUTES BEFORE MEALS, Disp: 60 capsule, Rfl: 5   predniSONE (DELTASONE) 5 MG tablet, Take 1 tablet (5 mg total) by mouth daily as needed., Disp: 90 tablet, Rfl: 3   REPATHA SURECLICK 140 MG/ML SOAJ, INJECT 1 ML UNDER THE SKIN ONCE EVERY 2 WEEKS, Disp: 2 mL, Rfl: 1   busPIRone (BUSPAR) 10 MG tablet, TAKE 1/2 TABLET BY MOUTH TWICE DAILY, Disp: 180 tablet, Rfl: 1  Past Medical History:  Diagnosis Date   Anemia    takes iron supplements, Pt states anemia is thought to be r/t a bleeding uterine polyp.   Anxiety    Follows w/ PCP, Dr. Harless Nakayama.   Arthritis    back, hips, shoulders, hands, knees   Asthma    Follows w/ pulmonology, Dr. [name].   Bronchial carcinoid tumors    s/p right middle lobe lobectomy   Cancer (HCC)    rectal, Lung   Chronic pain 02/2023   in multiple joints, Follows with [facility] rheumatology,   Dyspnea    with exertion   Emphysema of lung [facility])    Follows w/ pulmonology, Dr. [name].   Gastritis    Found on 10/2022 EGD. Following w/ Dr. Marina Goodell, GI.   GERD (gastroesophageal reflux disease)    History of kidney stones    passed   HLD (hyperlipidemia)    Follows w/ PCP, Dr. Harless Nakayama.   Hypertension    Follows w/ PCP, Benita Stabile @ [facility].   Pneumonia 02/2018   Rectal tumor    neuroendocrine  carcinoma of rectum, follows w/ GI , Dr. Sherre Lain and Dr. Shirline Frees at [facility].   Seasonal allergies    Wears glasses    Objective:  PHYSICAL EXAM:   VS: BP 134/82 (BP Location: Left Arm, Patient Position: Sitting)   Pulse 78   Temp 97.8  F (36.6 C) (Temporal)   Ht 5' 5.5" (1.664 m)   Wt 178 lb (80.7 kg)   LMP  (LMP Unknown)   SpO2 96%   BMI 29.17 kg/m   GEN: Well nourished, well developed, in no acute distress  Neck: no JVD or masses - no thyromegaly Cardiac: RRR; no murmurs, rubs, or gallops,no edema -  Respiratory:  normal respiratory rate and pattern with no distress - normal breath sounds with no rales, rhonchi, wheezes or rubs GI: normal bowel sounds, no masses or tenderness MS: no deformity or atrophy  Skin: warm and dry, no rash  Neuro:  Alert and Oriented x 3,  - CN II-Xii grossly intact Psych: euthymic mood, appropriate affect and demeanor   Assessment & Plan:    Primary hypertension -     CBC with Differential/Platelet -     Comprehensive metabolic panel -     TSH Continue meds Other iron deficiency anemia -     Iron, TIBC and Ferritin Panel Will consider stopping iron supplement Primary osteoarthritis of both hips  Gastroesophageal reflux disease with esophagitis without hemorrhage Continue prilosec Mixed hyperlipidemia -     Comprehensive metabolic panel -     Lipid panel  Vitamin D insufficiency -     VITAMIN D 25 Hydroxy (Vit-D Deficiency, Fractures)  Carcinoid bronchial adenoma, right (HCC) Follow up with specialist Malignant carcinoid tumor of rectum (HCC) Follow up with specialist Anxiety -     busPIRone HCl; TAKE 1/2 TABLET BY MOUTH TWICE DAILY  Dispense: 180 tablet; Refill: 1     Follow-up: Return in about 6 months (around 11/28/2023) for chronic fasting follow-up.  An After Visit Summary was printed and given to the patient.  Jettie Pagan [facility] [phone]

## 2023-06-01 LAB — CBC WITH DIFFERENTIAL/PLATELET
Basophils Absolute: 0 10*3/uL (ref 0.0–0.2)
Basos: 1 %
EOS (ABSOLUTE): 0.4 10*3/uL (ref 0.0–0.4)
Eos: 7 %
Hematocrit: 42.8 % (ref 34.0–46.6)
Hemoglobin: 13.6 g/dL (ref 11.1–15.9)
Immature Grans (Abs): 0 10*3/uL (ref 0.0–0.1)
Immature Granulocytes: 0 %
Lymphocytes Absolute: 2 10*3/uL (ref 0.7–3.1)
Lymphs: 34 %
MCH: 28.2 pg (ref 26.6–33.0)
MCHC: 31.8 g/dL (ref 31.5–35.7)
MCV: 89 fL (ref 79–97)
Monocytes Absolute: 0.4 10*3/uL (ref 0.1–0.9)
Monocytes: 7 %
Neutrophils Absolute: 3 10*3/uL (ref 1.4–7.0)
Neutrophils: 51 %
Platelets: 258 10*3/uL (ref 150–450)
RBC: 4.82 x10E6/uL (ref 3.77–5.28)
RDW: 12.5 % (ref 11.7–15.4)
WBC: 5.9 10*3/uL (ref 3.4–10.8)

## 2023-06-01 LAB — COMPREHENSIVE METABOLIC PANEL
ALT: 12 [IU]/L (ref 0–32)
AST: 16 [IU]/L (ref 0–40)
Albumin: 4.4 g/dL (ref 3.9–4.9)
Alkaline Phosphatase: 75 [IU]/L (ref 44–121)
BUN/Creatinine Ratio: 21 (ref 12–28)
BUN: 18 mg/dL (ref 8–27)
Bilirubin Total: 0.4 mg/dL (ref 0.0–1.2)
CO2: 25 mmol/L (ref 20–29)
Calcium: 10.5 mg/dL — ABNORMAL HIGH (ref 8.7–10.3)
Chloride: 103 mmol/L (ref 96–106)
Creatinine, Ser: 0.86 mg/dL (ref 0.57–1.00)
Globulin, Total: 2.2 g/dL (ref 1.5–4.5)
Glucose: 91 mg/dL (ref 70–99)
Potassium: 5.4 mmol/L — ABNORMAL HIGH (ref 3.5–5.2)
Sodium: 140 mmol/L (ref 134–144)
Total Protein: 6.6 g/dL (ref 6.0–8.5)
eGFR: 76 mL/min/{1.73_m2} (ref >59)

## 2023-06-01 LAB — LIPID PANEL
Chol/HDL Ratio: 2.9 ratio (ref 0.0–4.4)
Cholesterol, Total: 197 mg/dL (ref 100–199)
HDL: 67 mg/dL (ref >39)
LDL Chol Calc (NIH): 105 mg/dL — ABNORMAL HIGH (ref 0–99)
Triglycerides: 143 mg/dL (ref 0–149)
VLDL Cholesterol Cal: 25 mg/dL (ref 5–40)

## 2023-06-01 LAB — IRON,TIBC AND FERRITIN PANEL
Ferritin: 51 ng/mL (ref 15–150)
Iron Saturation: 30 % (ref 15–55)
Iron: 94 ug/dL (ref 27–139)
Total Iron Binding Capacity: 316 ug/dL (ref 250–450)
UIBC: 222 ug/dL (ref 118–369)

## 2023-06-01 LAB — VITAMIN D 25 HYDROXY (VIT D DEFICIENCY, FRACTURES): Vit D, 25-Hydroxy: 60.9 ng/mL (ref 30.0–100.0)

## 2023-06-01 LAB — TSH: TSH: 1.82 u[IU]/mL (ref 0.450–4.500)

## 2023-06-09 ENCOUNTER — Other Ambulatory Visit: Payer: Self-pay

## 2023-06-09 ENCOUNTER — Encounter: Payer: Self-pay | Admitting: Internal Medicine

## 2023-06-09 ENCOUNTER — Ambulatory Visit (INDEPENDENT_AMBULATORY_CARE_PROVIDER_SITE_OTHER): Payer: BC Managed Care – PPO | Admitting: Internal Medicine

## 2023-06-09 VITALS — BP 124/78 | HR 76 | Temp 98.1°F | Resp 14 | Ht 64.0 in | Wt 178.1 lb

## 2023-06-09 DIAGNOSIS — J3089 Other allergic rhinitis: Secondary | ICD-10-CM | POA: Diagnosis not present

## 2023-06-09 DIAGNOSIS — J432 Centrilobular emphysema: Secondary | ICD-10-CM

## 2023-06-09 DIAGNOSIS — K5229 Other allergic and dietetic gastroenteritis and colitis: Secondary | ICD-10-CM | POA: Diagnosis not present

## 2023-06-09 NOTE — Patient Instructions (Addendum)
Other Allergic Rhinitis: - Use nasal saline rinses before nose sprays such as with Neilmed Sinus Rinse.  Use distilled water.   - Hold all anti histamines (Zyrtec, Benadryl, Xyzal, Allegra, Claritin) for 3 days prior to the visit.   Diarrhea, Abdominal Pain, Nausea  - Follow up with GI regarding this.  Has hx of carcinoid tumor (rectal).  - Discussed IBS is a diagnosis of exclusion.   - Look into low FODMAP diet   Emphysema - Continue follow up with Pulm.  - On PRN Albuterol.     Follow up: 11/22 at 8:30 AM for skin testing 1-68

## 2023-06-09 NOTE — Progress Notes (Signed)
NEW PATIENT  Date of Service/Encounter:  06/09/23  Consult requested by: [name], PA-C   Subjective:   Shannon [name] (DOB: 07-26-1961) is a 62 y.o. female who presents to the clinic on 06/09/2023 with a chief complaint of Allergies (Has had allergies since childhood, but was told she outgrew it. Says that now they're come back with a vengeance. Believes that she allergies/sensitivities to certain foods.) and Asthma (Says her asthma is controlled, but also suffers from COPD.) .    History obtained from: chart review and patient.   IBS: Abdominal pain, cramping, nausea, diarrhea  Has been ongoing for years  Pain management drew IgG food labs that showed many positive results  Also followed by GI for carcinoid tumor of rectum  Has kept a food diary but can't pinpoint specific triggers Has not tried low FODMAP diet  Rhinitis:  Started around a very young age.   Symptoms include: rhinorrhea and post nasal drainage , some itchy eyes  Occurs year-roundbut worse in Spring Potential triggers: dust, feathers   Treatments tried:  No nose sprays PRN OTC anti histamines   Previous allergy testing: yes in childhood.  History of sinus surgery: no Nonallergic triggers: none   COPD: Has hx of childhood asthma and now COPD. Followed by Pulm.  They started her on Stiolto but she does not think she has used this at all.    Does have some SOB but denies wheezing/coughing  She is due to see them soon for follow up.  Significant smoking hx    Past Medical History: Past Medical History:  Diagnosis Date   Anemia    takes iron supplements, Pt states anemia is thought to be r/t a bleeding uterine polyp.   Anxiety    Follows w/ PCP, Dr. Harless Nakayama.   Arthritis    back, hips, shoulders, hands, knees   Asthma    Follows w/ pulmonology, Dr. [name].   Bronchial carcinoid tumors    s/p right middle lobe lobectomy   Cancer (HCC)    rectal, Lung   Chronic pain 02/2023    in multiple joints, Follows with [facility]ology,   Dyspnea    with exertion   Emphysema of lung [facility])    Follows w/ pulmonology, Dr. [name].   Gastritis    Found on 10/2022 EGD. Following w/ Dr. Marina Goodell, GI.   GERD (gastroesophageal reflux disease)    History of kidney stones    passed   HLD (hyperlipidemia)    Follows w/ PCP, Dr. Harless Nakayama.   Hypertension    Follows w/ PCP, Benita Stabile @ [facility].   Pneumonia 02/2018   Rectal tumor    neuroendocrine carcinoma of rectum, follows w/ GI , Dr. Sherre Lain and Dr. Shirline Frees [facility]   Seasonal allergies    Wears glasses      Past Surgical History: Past Surgical History:  Procedure Laterality Date   ABLATION     endometrial   ANTERIOR AND POSTERIOR REPAIR N/A 04/29/2023   Procedure: ANTERIOR (CYSTOCELE) AND POSTERIOR REPAIR (RECTOCELE);  Surgeon: Lavina Hamman, MD;  Location: [facility];  Service: Gynecology;  Laterality: N/A;   BIOPSY  10/10/2018   Procedure: BIOPSY;  Surgeon: Meridee Score Netty Starring., MD;  Location: [facility] ENDOSCOPY;  Service: Gastroenterology;;   BIOPSY  06/12/2019   Procedure: BIOPSY;  Surgeon: Lemar Lofty., MD;  Location: [facility] ENDOSCOPY;  Service: Gastroenterology;;   BIOPSY  09/30/2020  Procedure: BIOPSY;  Surgeon: Lemar Lofty., MD;  Location: [facility] ENDOSCOPY;  Service: Gastroenterology;;   BIOPSY  11/18/2022   Procedure: BIOPSY;  Surgeon: Lemar Lofty., MD;  Location: [facility] ENDOSCOPY;  Service: Gastroenterology;;   COLONOSCOPY     COLONOSCOPY WITH PROPOFOL N/A 11/18/2022   Procedure: COLONOSCOPY WITH PROPOFOL;  Surgeon: Lemar Lofty., MD;  Location: [facility] ENDOSCOPY;  Service: Gastroenterology;  Laterality: N/A;   CYSTOSCOPY N/A 04/29/2023   Procedure: CYSTOSCOPY;  Surgeon: Lavina Hamman, MD;  Location: [facility];  Service: Gynecology;  Laterality: N/A;   DOPPLER ECHOCARDIOGRAPHY     ENDOSCOPIC  MUCOSAL RESECTION N/A 10/10/2018   Procedure: ENDOSCOPIC MUCOSAL RESECTION;  Surgeon: Meridee Score, Netty Starring., MD;  Location: [facility] ENDOSCOPY;  Service: Gastroenterology;  Laterality: N/A;   ESOPHAGOGASTRODUODENOSCOPY N/A 11/18/2022   Procedure: ESOPHAGOGASTRODUODENOSCOPY (EGD);  Surgeon: Lemar Lofty., MD;  Location: [facility] ENDOSCOPY;  Service: Gastroenterology;  Laterality: N/A;   ESOPHAGOGASTRODUODENOSCOPY  03/30/2023   normal   EUS N/A 07/18/2018   Procedure: LOWER ENDOSCOPIC ULTRASOUND (EUS);  Surgeon: Lemar Lofty., MD;  Location: [facility] ENDOSCOPY;  Service: Gastroenterology;  Laterality: N/A;   EUS N/A 10/10/2018   Procedure: LOWER ENDOSCOPIC ULTRASOUND (EUS);  Surgeon: Lemar Lofty., MD;  Location: [facility] ENDOSCOPY;  Service: Gastroenterology;  Laterality: N/A;   EUS N/A 06/12/2019   Procedure: LOWER ENDOSCOPIC ULTRASOUND (EUS);  Surgeon: Lemar Lofty., MD;  Location: [facility] ENDOSCOPY;  Service: Gastroenterology;  Laterality: N/A;   EUS N/A 09/30/2020   Procedure: LOWER ENDOSCOPIC ULTRASOUND (EUS);  Surgeon: Lemar Lofty., MD;  Location: [facility] ENDOSCOPY;  Service: Gastroenterology;  Laterality: N/A;   EUS N/A 11/18/2022   Procedure: LOWER ENDOSCOPIC ULTRASOUND (EUS);  Surgeon: Lemar Lofty., MD;  Location: [facility] ENDOSCOPY;  Service: Gastroenterology;  Laterality: N/A;   FLEXIBLE SIGMOIDOSCOPY N/A 07/18/2018   Procedure: FLEXIBLE SIGMOIDOSCOPY;  Surgeon: Meridee Score Netty Starring., MD;  Location: [facility] ENDOSCOPY;  Service: Gastroenterology;  Laterality: N/A;   FLEXIBLE SIGMOIDOSCOPY N/A 10/10/2018   Procedure: FLEXIBLE SIGMOIDOSCOPY;  Surgeon: Meridee Score Netty Starring., MD;  Location: [facility] ENDOSCOPY;  Service: Gastroenterology;  Laterality: N/A;   FLEXIBLE SIGMOIDOSCOPY N/A 06/12/2019   Procedure: FLEXIBLE SIGMOIDOSCOPY;  Surgeon: Meridee Score Netty Starring., MD;  Location: [facility] ENDOSCOPY;  Service: Gastroenterology;  Laterality: N/A;   FLEXIBLE SIGMOIDOSCOPY N/A  09/30/2020   Procedure: FLEXIBLE SIGMOIDOSCOPY;  Surgeon: Meridee Score Netty Starring., MD;  Location: [facility] ENDOSCOPY;  Service: Gastroenterology;  Laterality: N/A;   HEMOSTASIS CLIP PLACEMENT  10/10/2018   Procedure: HEMOSTASIS CLIP PLACEMENT;  Surgeon: Lemar Lofty., MD;  Location: [facility] ENDOSCOPY;  Service: Gastroenterology;;   LUNG CANCER SURGERY  12/30/2018   right lun- mid lobe , dr gerhardt   POLYPECTOMY  11/18/2022   Procedure: POLYPECTOMY;  Surgeon: Lemar Lofty., MD;  Location: [facility] ENDOSCOPY;  Service: Gastroenterology;;   Gaspar Bidding DILATION N/A 11/18/2022   Procedure: Jacklyn Shell;  Surgeon: Lemar Lofty., MD;  Location: [facility] ENDOSCOPY;  Service: Gastroenterology;  Laterality: N/A;   SUBMUCOSAL LIFTING INJECTION  10/10/2018   Procedure: SUBMUCOSAL LIFTING INJECTION;  Surgeon: Meridee Score Netty Starring., MD;  Location: [facility] ENDOSCOPY;  Service: Gastroenterology;;   TEE WITHOUT CARDIOVERSION  02/2018   TONSILLECTOMY     TOTAL HIP ARTHROPLASTY Left 11/2021   TOTAL LAPAROSCOPIC HYSTERECTOMY WITH BILATERAL SALPINGO OOPHORECTOMY Bilateral 04/29/2023   Procedure: TOTAL LAPAROSCOPIC HYSTERECTOMY WITH BILATERAL SALPINGO OOPHORECTOMY;  Surgeon: Lavina Hamman, MD;  Location: [facility];  Service: Gynecology;  Laterality: Bilateral;   TUBAL LIGATION  1987   VIDEO ASSISTED  THORACOSCOPY (VATS)/WEDGE RESECTION Right 12/30/2018   Procedure: VIDEO ASSISTED THORACOSCOPY (VATS), MINI THORACOTOMY, RIGHT MIDDLE LOBECTOMY WITH LYMPH NODE DISSECTION;  Surgeon: Delight Ovens, MD;  Location: [facility] OR;  Service: Thoracic;  Laterality: Right;   VIDEO BRONCHOSCOPY N/A 10/12/2018   Procedure: VIDEO BRONCHOSCOPY;  Surgeon: Delight Ovens, MD;  Location: [facility] OR;  Service: Thoracic;  Laterality: N/A;   VIDEO BRONCHOSCOPY N/A 12/30/2018   Procedure: VIDEO BRONCHOSCOPY;  Surgeon: Delight Ovens, MD;  Location: [facility] OR;  Service: Thoracic;  Laterality: N/A;   WISDOM TOOTH  EXTRACTION      Family History: Family History  Problem Relation Age of Onset   Asthma Mother    Allergic rhinitis Mother    Heart disease Mother    COPD Mother    Emphysema Mother    Fibromyalgia Mother    Arthritis Mother    Diabetes Father    Healthy Daughter    Healthy Son    Colon cancer Neg Hx    Colon polyps Neg Hx    Esophageal cancer Neg Hx    Rectal cancer Neg Hx    Stomach cancer Neg Hx     Social History:  Flooring in bedroom: carpet Pets: cat Tobacco use/exposure: age 1-2006, 1 pack per day;  Job: house cleaning   Medication List:  Allergies as of 06/09/2023       Reactions   Hydrocodone Nausea Only, Other (See Comments)   "terrible headache"   Pravastatin Other (See Comments)   Muscle/joint pain   Levaquin [levofloxacin In D5w] Nausea And Vomiting      Other    Dog Dander         Medication List        Accurate as of June 09, 2023  2:09 PM. If you have any questions, ask your nurse or doctor.          acetaminophen 650 MG CR tablet Commonly known as: TYLENOL Take 650 mg by mouth every 8 (eight) hours as needed for pain.   albuterol 108 (90 Base) MCG/ACT inhaler Commonly known as: VENTOLIN HFA INHALE 2 PUFFS INTO THE LUNGS EVERY 6 HOURS AS NEEDED FOR WHEEZING OR SHORTNESS OF BREATH   B-12 2500 MCG Tabs Take 2,500 mcg by mouth daily.   busPIRone 10 MG tablet Commonly known as: BUSPAR TAKE 1/2 TABLET BY MOUTH TWICE DAILY   calcium carbonate 500 MG chewable tablet Commonly known as: TUMS - dosed in mg elemental calcium Chew 1 tablet by mouth daily as needed for indigestion or heartburn.   cyclobenzaprine 10 MG tablet Commonly known as: FLEXERIL Take 10 mg by mouth 3 (three) times daily as needed for muscle spasms.   ferrous sulfate 324 MG Tbec Take 324 mg by mouth daily with breakfast.   gabapentin 300 MG capsule Commonly known as: NEURONTIN Take 300 mg by mouth at bedtime as needed (pain).   ibuprofen 600 MG  tablet Commonly known as: ADVIL Take 1 tablet by mouth every 6 (six) hours.   lisinopril 20 MG tablet Commonly known as: ZESTRIL TAKE 1 TABLET(20 MG) BY MOUTH DAILY   LORazepam 1 MG tablet Commonly known as: ATIVAN TAKE 1 TABLET(1 MG) BY MOUTH AT BEDTIME   omeprazole 40 MG capsule Commonly known as: PRILOSEC TAKE 1 CAPSULE BY MOUTH 2 TIMES DAILY. TAKE 30-60 MINUTES BEFORE MEALS   predniSONE 5 MG tablet Commonly known as: DELTASONE Take 1 tablet (5 mg total) by mouth daily as needed.   Repatha SureClick 140  MG/ML Soaj Generic drug: Evolocumab INJECT 1 ML UNDER THE SKIN ONCE EVERY 2 WEEKS   Vitamin D-3 125 MCG (5000 UT) Tabs Take 5,000 Units by mouth daily at 12 noon.         REVIEW OF SYSTEMS: Pertinent positives and negatives discussed in HPI.   Objective:   Physical Exam: BP 124/78 (BP Location: Left Arm, Patient Position: Sitting, Cuff Size: Normal)   Pulse 76   Temp 98.1 F (36.7 C) (Temporal)   Resp 14   Ht 5\' 4"  (1.626 m)   Wt 178 lb 1.6 oz (80.8 kg)   LMP  (LMP Unknown)   SpO2 100%   BMI 30.57 kg/m  Body mass index is 30.57 kg/m. GEN: alert, well developed HEENT: clear conjunctiva, nose with + mild inferior turbinate hypertrophy, pink nasal mucosa, slight clear rhinorrhea, + cobblestoning HEART: regular rate and rhythm, no murmur LUNGS: clear to auscultation bilaterally, no coughing, unlabored respiration ABDOMEN: soft, non distended  SKIN: no rashes or lesions  Reviewed:  02/09/2023: seen by Dr Bettye Boeck for emphysema, SOB/wheezing.  Had childhood hx of asthma, bronchial carcinoid tumor s/p RML lobectomy.  Started on Stiolto, prednisone x3 days and PRN albuterol.   05/31/2023: seen by Earlene Plater PA, PMH of HLD, GERD, anxiety, carcinoid tumor of rectum/bronchial.  03/30/2023: EGD with normal esophagus, stomach, duodeum.   PFT 02/08/2023: self interpreted  Obstruction by ratio, not clinically significant reversibility, elevated TLC like gas trapping,  normal DLCO.    Positive IgG food testing to multiple foods.    Assessment:   1. Other allergic rhinitis   2. Centrilobular emphysema (HCC)   3. Other allergic and dietetic gastroenteritis and colitis     Plan/Recommendations:  Other Allergic Rhinitis: - Due to turbinate hypertrophy, seasonal worsening and unresponsive to over the counter meds, will perform skin testing to identify aeroallergen triggers.   - Use nasal saline rinses before nose sprays such as with Neilmed Sinus Rinse.  Use distilled water.   - Hold all anti histamines (Zyrtec, Benadryl, Xyzal, Allegra, Claritin) for 3 days prior to the visit.   Diarrhea, Abdominal Pain, Nausea  - Follow up with GI regarding this.  Has hx of carcinoid tumor (rectal).  - Discussed symptoms concerning for IBS.  Low suspicion for IgE mediated food allergy.   - Also discussed IgG food testing has no utility for food allergies. Regarding results from her food testing and its clinical relevance please follow up with pain management.   - Look into low FODMAP diet   Emphysema - Discussed use of Stiolto as recommend by Pulm. Also on PRN Albuterol  - Continue follow up with Pulm.   Follow up: 11/22 at 8:30 AM 1-68 Alesia Morin, MD [facility]

## 2023-06-16 ENCOUNTER — Ambulatory Visit (INDEPENDENT_AMBULATORY_CARE_PROVIDER_SITE_OTHER): Payer: BC Managed Care – PPO | Admitting: Internal Medicine

## 2023-06-16 DIAGNOSIS — J3089 Other allergic rhinitis: Secondary | ICD-10-CM

## 2023-06-16 DIAGNOSIS — T781XXD Other adverse food reactions, not elsewhere classified, subsequent encounter: Secondary | ICD-10-CM

## 2023-06-16 DIAGNOSIS — K5229 Other allergic and dietetic gastroenteritis and colitis: Secondary | ICD-10-CM

## 2023-06-16 MED ORDER — CETIRIZINE HCL 10 MG PO TABS: 10.0000 mg | ORAL_TABLET | Freq: Every day | ORAL | 11 refills | Status: DC | PRN

## 2023-06-16 NOTE — Patient Instructions (Addendum)
Other Allergic Rhinitis: - SPT 05/2023: positive to none  - Use nasal saline rinses before nose sprays such as with Neilmed Sinus Rinse.  Use distilled water.   - Use Zyrtec 10mg  daily as needed for runny nose, sneezing.   - Dry eyes are not from allergies.  Your allergy testing was all negative. Likely from screen use.  Limit time on devices and use lubricating eye drops.    Diarrhea, Abdominal Pain, Nausea  - SPT 05/2023: negative to commonly allergenic foods - Follow up with GI regarding this.  Has hx of carcinoid tumor (rectal).  - Discussed symptoms concerning for IBS but that is a diagnosis of exclusion.   - Look into low FODMAP diet    Emphysema - Discussed use of Stiolto as recommend by Pulm. Also on PRN Albuterol  - Continue follow up with Pulm.

## 2023-06-16 NOTE — Progress Notes (Signed)
FOLLOW UP Date of Service/Encounter:  06/16/23   Subjective:  Shannon [name] (DOB: [birth year]) is a 62 y.o. female who [facility] for follow up for skin testing.   History obtained from: chart review and patient.  Anti histamines held.   Past Medical History: Past Medical History:  Diagnosis Date   Anemia    takes iron supplements, Pt states anemia is thought to be r/t a bleeding uterine polyp.   Anxiety    Follows w/ PCP, Dr. Harless Nakayama.   Arthritis    back, hips, shoulders, hands, knees   Asthma    Follows w/ pulmonology, Dr. [name].   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 [name].   Gastritis    Found on 10/2022 EGD. Following w/ Dr. Marina Goodell, GI.   GERD (gastroesophageal reflux disease)    History of kidney stones    passed   HLD (hyperlipidemia)    Follows w/ PCP, Dr. Harless Nakayama.   Hypertension    Follows w/ PCP, Benita Stabile @ [facility].   Pneumonia 02/2018   Rectal tumor    neuroendocrine carcinoma of rectum, follows w/ GI , Dr. Sherre Lain and Dr. Shirline Frees at [facility].   Seasonal allergies    Wears glasses     Objective:  LMP  (LMP Unknown)  There is no height or weight on file to calculate BMI. Physical Exam: GEN: alert, well developed HEENT: clear conjunctiva, MMM LUNGS: unlabored respiration  Skin Testing:  Skin prick testing was placed, which includes aeroallergens/foods, histamine control, and saline control.  Verbal consent was obtained prior to placing test.  Patient tolerated procedure well.  Allergy testing results were read and interpreted by myself, documented by clinical staff. Adequate positive and negative control.  Positive  results to:  Results discussed with patient/family.  Airborne Adult Perc - 06/16/23 1038     Time Antigen Placed 1038    Allergen Manufacturer Waynette Buttery    Location Back    Number of Test 55    1. Control-Buffer 50% Glycerol Negative    2. Control-Histamine 3+    3. Bahia Negative    4. French Southern Territ[place] Negative    5. Johnson Negative    6. Kentucky Blue Negative    7. Meadow Fescue Negative    8. Perennial Rye Negative    9. Timothy Negative    10. Ragweed Mix Negative    11. Cocklebur Negative    12. Plantain,  English Negative    13. Baccharis Negative    14. Dog Fennel Negative    15. Russian Thistle Negative    16. Lamb's Quarters Negative    17. Sheep Sorrell Negative    18. Rough Pigweed Negative    19. Marsh Elder, Rough Negative    20. Mugwort, Common Negative    21. Box, Elder Negative    22. Cedar, red Negative    23. Sweet Gum Negative    24. Pecan Pollen Negative    25. Pine Mix Negative    26. Walnut, Black Pollen Negative    27. Red Mulberry Negative    28. Ash Mix Negative  29. Birch Mix Negative    30. Beech American Negative    31. Cottonwood, Guinea-Bissau Negative    32. Hickory, White Negative    33. Maple Mix Negative    34. Oak, Guinea[place]issau Mix Negative    35. Sycamore Eastern Negative    36. Alternaria Alternata Negative    37. Cladosporium Herbarum Negative    38. Aspergillus Mix Negative    39. Penicillium Mix Negative    40. Bipolaris Sorokiniana (Helminthosporium) Negative    41. Drechslera Spicifera (Curvularia) Negative    42. Mucor Plumbeus Negative    43. Fusarium Moniliforme Negative    44. Aureobasidium Pullulans (pullulara) Negative    45. Rhizopus Oryzae Negative    46. Botrytis Cinera Negative    47. Epicoccum Nigrum Negative    48. Phoma Betae Negative    49. Dust Mite Mix Negative    50. Cat Hair 10,000 BAU/ml Negative    51.  Dog Epithelia Negative    52. Mixed Feathers Negative    53. Horse Epithelia Negative    54. Cockroach, German  Negative    55. Tobacco Leaf Negative             13 Food Perc - 06/16/23 1038       Test Information   Time Antigen Placed 1038    Allergen Manufacturer Waynette Buttery    Location Back    Number of allergen test 13    Food Select      Food   1. Peanut Negative    2. Soybean Negative    3. Wheat Negative    4. Sesame Negative    5. Milk, Cow Negative    6. Casein Negative    7. Egg White, Chicken Negative    8. Shellfish Mix Negative    9. Fish Mix Negative    10. Cashew Negative    11. Walnut Food Negative    12. Almond Negative    13. Hazelnut Negative              Assessment:   1. Other allergic rhinitis   2. Adverse food reaction, subsequent encounter   3. Other allergic and dietetic gastroenteritis and colitis     Plan/Recommendations:  Other Allergic Rhinitis: - Due to turbinate hypertrophy, seasonal worsening and unresponsive to over the counter meds, will perform skin testing to identify aeroallergen triggers.   - SPT 05/2023: positive to none  - Use nasal saline rinses before nose sprays such as with Neilmed Sinus Rinse.  Use distilled water.   - Use Zyrtec 10mg  daily as needed for runny nose, sneezing, itchy watery eyes.  - Dry eyes are not from allergies.  Your allergy testing was all negative. Likely from screen use.  Limit time on devices and use lubricating eye drops.    Diarrhea, Abdominal Pain, Nausea  - Follow up with GI regarding this.  Has hx of carcinoid tumor (rectal).  - Discussed symptoms concerning for IBS but that is a diagnosis of exclusion.  Low suspicion for IgE mediated food allergy.   - Also discussed IgG food testing has no utility for food allergies. Regarding results from her food testing and its clinical relevance please follow up with pain management.   - Look into low FODMAP diet    Emphysema - Discussed use of Stiolto as recommend by Pulm. Also on PRN Albuterol  - Continue follow up with Pulm.    Return if symptoms worsen  or fail to improve.  Alesia Morin, MD [facility]

## 2023-06-18 ENCOUNTER — Ambulatory Visit: Payer: BC Managed Care – PPO | Admitting: Internal Medicine

## 2023-06-27 ENCOUNTER — Other Ambulatory Visit: Payer: Self-pay | Admitting: Physician Assistant

## 2023-06-27 DIAGNOSIS — E782 Mixed hyperlipidemia: Secondary | ICD-10-CM

## 2023-07-02 ENCOUNTER — Encounter: Payer: BC Managed Care – PPO | Admitting: Physical Medicine and Rehabilitation

## 2023-07-02 NOTE — Progress Notes (Unsigned)
   Cardiology Office Note    Date:  07/02/2023  ID:  Shannon [name], DOB [birth year], MRN [id number] PCP:  Blane Ohara, MD  Cardiologist:  None  Electrophysiologist:  None   Chief Complaint: ***  History of Present Illness: .    Shannon [name] is a 62 y.o. 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 Remotely saw Dr. Eden Emms in 2019 with normal ETT and 2D echo EF 55-60%, G1DD at that time. She was previously incidentally noted to have mild coronary/aortic atherosclerosis on non-cardiac CT imaging. Risk factor modification recommended.  In December add 81mg  asa Lipid plan, repatha add Zetia   Mild coronary/aortic atherosclerosis Hyperlipidemia Hyperkalemia, hypercalcemia HTN    Labwork independently reviewed: 05/2023 LDL 105, trig 143, TSH wnl, K 5.4, Cr 0.86, calcium 10.5, TSH wnl, CBC wnl  ROS: .    Please see the history of present illness. Otherwise, review of systems is positive for ***.  All other systems are reviewed and otherwise negative.  Studies Reviewed: Marland Kitchen    EKG:  EKG is ordered today, personally reviewed, demonstrating ***  CV Studies: Cardiac studies reviewed are outlined and summarized above. Otherwise please see EMR for full report.   Current Reported Medications:.    No outpatient medications have been marked as taking for the 07/07/23 encounter (Appointment) with Laurann Montana, PA-C.    Physical Exam:    VS:  LMP  (LMP Unknown)    Wt Readings from Last 3 Encounters:  06/09/23 178 lb 1.6 oz (80.8 kg)  05/31/23 178 lb (80.7 kg)  05/18/23 180 lb (81.6 kg)    GEN: Well nourished, well developed in no acute distress NECK: No JVD; No carotid bruits CARDIAC: ***RRR, no murmurs, rubs, gallops RESPIRATORY:  Clear to auscultation without rales, wheezing or rhonchi  ABDOMEN: Soft,  non-tender, non-distended EXTREMITIES:  No edema; No acute deformity   Asessement and Plan:.     ***     Disposition: F/u with ***  Signed, Laurann Montana, PA-C

## 2023-07-06 ENCOUNTER — Ambulatory Visit: Payer: BC Managed Care – PPO | Admitting: Nurse Practitioner

## 2023-07-07 ENCOUNTER — Encounter: Payer: Self-pay | Admitting: Physician Assistant

## 2023-07-07 ENCOUNTER — Telehealth: Payer: Self-pay | Admitting: Physician Assistant

## 2023-07-07 ENCOUNTER — Ambulatory Visit: Payer: BC Managed Care – PPO | Attending: Nurse Practitioner | Admitting: Physician Assistant

## 2023-07-07 VITALS — BP 122/84 | HR 82 | Resp 16 | Ht 64.0 in | Wt 178.8 lb

## 2023-07-07 DIAGNOSIS — E785 Hyperlipidemia, unspecified: Secondary | ICD-10-CM | POA: Diagnosis not present

## 2023-07-07 DIAGNOSIS — I2510 Atherosclerotic heart disease of native coronary artery without angina pectoris: Secondary | ICD-10-CM

## 2023-07-07 DIAGNOSIS — I700 Atherosclerosis of aorta: Secondary | ICD-10-CM

## 2023-07-07 DIAGNOSIS — I10 Essential (primary) hypertension: Secondary | ICD-10-CM

## 2023-07-07 DIAGNOSIS — E875 Hyperkalemia: Secondary | ICD-10-CM | POA: Diagnosis not present

## 2023-07-07 MED ORDER — EZETIMIBE 10 MG PO TABS: 10.0000 mg | ORAL_TABLET | Freq: Every day | ORAL | 3 refills | Status: DC

## 2023-07-07 NOTE — Patient Instructions (Addendum)
Medication Instructions:    START TAKING :  EZETIMIBE ( ZETIA) 10 MG ONCE A DAY   *If you need a refill on your cardiac medications before your next appointment, please call your pharmacy*   Lab Work: PLEASE GO DOWN STAIRS FIRST FLOOR  [address] :  BMET AND CALCIUM  TODAY    RETURN IN 2-3 MONTHS FOR :  FASTING LIPID PANEL AND CMET       If you have labs (blood work) drawn today and your tests are completely normal, you will receive your results only by: MyChart Message (if you have MyChart) OR A paper copy in the mail If you have any lab test that is abnormal or we need to change your treatment, we will call you to review the results.   Testing/Procedures: NONE ORDERED  TODAY      Follow-Up: At [facility], you and your health needs are our priority.  As part of our continuing mission to provide you with exceptional heart care, we have created designated Provider Care Teams.  These Care Teams include your primary Cardiologist (physician) and Advanced Practice Providers (APPs -  Physician Assistants and Nurse Practitioners) who all work together to provide you with the care you need, when you need it.  We recommend signing up for the patient portal called "MyChart".  Sign up information is provided on this After Visit Summary.  MyChart is used to connect with patients for Virtual Visits (Telemedicine).  Patients are able to view lab/test results, encounter notes, upcoming appointments, etc.  Non-urgent messages can be sent to your provider as well.   To learn more about what you can do with MyChart, go to [email or web address].    Your next appointment:    1 year(s)  Provider:    Dr. Izora Ribas  Other Instructions

## 2023-07-07 NOTE — Telephone Encounter (Signed)
Reviewed. A baby aspirin once daily is reasonable if it is felt to provide significant clinical benefit. Having said that, she should co-administer a PPI DAILY (I believe she is on omeprazole 40 mg daily.  This would be adequate).  Daily PPI does not eliminate, but significantly reduces reduces the risk of recurrent ulcers. Thanks for checking Dr. Marina Goodell

## 2023-07-07 NOTE — Telephone Encounter (Signed)
Will route message to patient's GI physician to inquire whether he is OK with her being on preventative baby ASA 81mg  daily given patient's history of GI ulcer - she reports his happened back when she was on meloxicam. She has h/o aortic calcification/coronary calcification but no obstructive CAD so will hold off starting if GI feels ulcer risk is prohibitive. Thank you for your input, Dr. Marina Goodell!

## 2023-07-08 ENCOUNTER — Encounter
Payer: BC Managed Care – PPO | Attending: Physical Medicine and Rehabilitation | Admitting: Physical Medicine and Rehabilitation

## 2023-07-08 ENCOUNTER — Other Ambulatory Visit (HOSPITAL_COMMUNITY)
Admission: RE | Admit: 2023-07-08 | Discharge: 2023-07-08 | Disposition: A | Discharge status: Home / Self Care | Payer: Self-pay | Source: Ambulatory Visit | Attending: Medical Genetics | Admitting: Medical Genetics

## 2023-07-08 ENCOUNTER — Telehealth: Payer: Self-pay

## 2023-07-08 VITALS — BP 126/81 | HR 91 | Ht 64.0 in | Wt 179.0 lb

## 2023-07-08 DIAGNOSIS — Z006 Encounter for examination for normal comparison and control in clinical research program: Secondary | ICD-10-CM | POA: Insufficient documentation

## 2023-07-08 DIAGNOSIS — M542 Cervicalgia: Secondary | ICD-10-CM | POA: Diagnosis present

## 2023-07-08 LAB — BASIC METABOLIC PANEL
BUN/Creatinine Ratio: 17 (ref 12–28)
BUN: 19 mg/dL (ref 8–27)
CO2: 26 mmol/L (ref 20–29)
Calcium: 10.5 mg/dL — ABNORMAL HIGH (ref 8.7–10.3)
Chloride: 104 mmol/L (ref 96–106)
Creatinine, Ser: 1.1 mg/dL — ABNORMAL HIGH (ref 0.57–1.00)
Glucose: 83 mg/dL (ref 70–99)
Potassium: 5 mmol/L (ref 3.5–5.2)
Sodium: 141 mmol/L (ref 134–144)
eGFR: 57 mL/min/{1.73_m2} — ABNORMAL LOW (ref >59)

## 2023-07-08 LAB — CALCIUM, IONIZED: Calcium, Ion: 5.6 mg/dL (ref 4.5–5.6)

## 2023-07-08 MED ORDER — LIDOCAINE HCL 1 % IJ SOLN
5.0000 mL | Freq: Once | INTRAMUSCULAR | Status: AC
Start: 2023-07-08 — End: 2023-07-08
  Administered 2023-07-08: 5 mL via INTRADERMAL

## 2023-07-08 MED ORDER — SODIUM CHLORIDE (PF) 0.9 % IJ SOLN
2.0000 mL | Freq: Every day | INTRAMUSCULAR | Status: AC
  Administered 2023-07-08: 2 mL via INTRAVENOUS

## 2023-07-08 MED ORDER — PREGABALIN 25 MG PO CAPS: 25.0000 mg | ORAL_CAPSULE | Freq: Every evening | ORAL | 3 refills | Status: DC

## 2023-07-08 NOTE — Telephone Encounter (Signed)
-----   Message from Laurann Montana sent at 07/08/2023 12:49 PM EST ----- Hi triage - please let patient know that labs show mild bump in kidney function from previous value, calcium remains elevated as well. Ionized calcium level was also top normal. Potassium level has normalized but also remains upper limits of normal. Her kidney function looks to have waxed/waned recently since September and elevated potassium has been an intermittent problem. I would suggest she follow up with her primary care team for these abnormalities - will cc to [name] as she had been seeing the patient most recently. They have her on lisinopril; may need further consideration of dose adjustment but will defer to primary care.  Otherwise continue plan for adding Zetia, recheck CMET/lipids fasting in 2-3 months as previously advised, and f/u cardiology in 1 year.

## 2023-07-08 NOTE — Progress Notes (Signed)

## 2023-07-08 NOTE — Telephone Encounter (Signed)
I will route note to Dr. Izora Ribas to inquire how compelled he is to have patient on baby ASA given history (he had advised to consider starting at this December visit; patient also reported h/o stomach ulcer while on meloxicam at yesterday's visit so I routed to GI for input below). She has h/o incidentally noted mild coronary/aortic atherosclerosis on non gated imaging. No known obstructive disease thus far. Appreciate MD input.

## 2023-07-08 NOTE — Telephone Encounter (Signed)
Call to patient to discuss labs, no answer, left detailed message per DPR asking patient to call our office to discuss.

## 2023-07-09 NOTE — Telephone Encounter (Signed)
Spoke with pt and explained Shannon [name]'s note regarding holding off for the time being on starting Aspirin. Pt verbalized understanding and had no questions. Pt also verbalized understanding of the information left on the VM from [facility] regarding recent lab results and the plan going forward. Pt has no further questions at this time.

## 2023-07-09 NOTE — Telephone Encounter (Signed)
FYI, patient also had lab results called to her 12/12 with message left to call back.   Please let Ms. Juillerat know I conferred with GI and Dr. Izora Ribas. Dr. Marina Goodell felt it was OK to use baby ASA if she was felt to provide significant clinical benefit but acknowledging that her ulcer risk is not zero, Dr. Izora Ribas feels we can hold off starting this which I agree with since she does not have any known obstructive coronary disease at this time. The risk is felt to outweigh benefit of prevention at this time. So bottom line: OK to hold off starting a baby aspirin at this time. The main focus should be on her cholesterol treatment which we are doing. Thank you!

## 2023-07-17 LAB — GENECONNECT MOLECULAR SCREEN: Genetic Analysis Overall Interpretation: NEGATIVE

## 2023-07-22 ENCOUNTER — Other Ambulatory Visit: Payer: Self-pay

## 2023-07-22 DIAGNOSIS — I700 Atherosclerosis of aorta: Secondary | ICD-10-CM

## 2023-08-01 ENCOUNTER — Other Ambulatory Visit: Payer: Self-pay | Admitting: Physician Assistant

## 2023-08-01 DIAGNOSIS — F419 Anxiety disorder, unspecified: Secondary | ICD-10-CM

## 2023-08-12 LAB — COMPREHENSIVE METABOLIC PANEL
ALT: 16 [IU]/L (ref 0–32)
AST: 18 [IU]/L (ref 0–40)
Albumin: 4.1 g/dL (ref 3.9–4.9)
Alkaline Phosphatase: 74 [IU]/L (ref 44–121)
BUN/Creatinine Ratio: 22 (ref 12–28)
BUN: 17 mg/dL (ref 8–27)
Bilirubin Total: 0.3 mg/dL (ref 0.0–1.2)
CO2: 26 mmol/L (ref 20–29)
Calcium: 10 mg/dL (ref 8.7–10.3)
Chloride: 106 mmol/L (ref 96–106)
Creatinine, Ser: 0.79 mg/dL (ref 0.57–1.00)
Globulin, Total: 1.9 g/dL (ref 1.5–4.5)
Glucose: 86 mg/dL (ref 70–99)
Potassium: 4.7 mmol/L (ref 3.5–5.2)
Sodium: 144 mmol/L (ref 134–144)
Total Protein: 6 g/dL (ref 6.0–8.5)
eGFR: 85 mL/min/{1.73_m2} (ref >59)

## 2023-08-12 LAB — LIPID PANEL
Chol/HDL Ratio: 2.1 {ratio} (ref 0.0–4.4)
Cholesterol, Total: 152 mg/dL (ref 100–199)
HDL: 73 mg/dL (ref >39)
LDL Chol Calc (NIH): 58 mg/dL (ref 0–99)
Triglycerides: 123 mg/dL (ref 0–149)
VLDL Cholesterol Cal: 21 mg/dL (ref 5–40)

## 2023-08-23 ENCOUNTER — Other Ambulatory Visit: Payer: Self-pay | Admitting: Family Medicine

## 2023-08-23 DIAGNOSIS — E782 Mixed hyperlipidemia: Secondary | ICD-10-CM

## 2023-09-03 ENCOUNTER — Ambulatory Visit (HOSPITAL_COMMUNITY)
Admission: RE | Admit: 2023-09-03 | Discharge: 2023-09-03 | Disposition: A | Discharge status: Home / Self Care | Payer: 59 | Source: Ambulatory Visit | Attending: Physician Assistant | Admitting: Physician Assistant

## 2023-09-03 ENCOUNTER — Inpatient Hospital Stay: Payer: 59 | Attending: Internal Medicine

## 2023-09-03 DIAGNOSIS — R911 Solitary pulmonary nodule: Secondary | ICD-10-CM | POA: Diagnosis not present

## 2023-09-03 DIAGNOSIS — C7A090 Malignant carcinoid tumor of the bronchus and lung: Secondary | ICD-10-CM | POA: Insufficient documentation

## 2023-09-03 DIAGNOSIS — C7A026 Malignant carcinoid tumor of the rectum: Secondary | ICD-10-CM | POA: Insufficient documentation

## 2023-09-03 DIAGNOSIS — Z902 Acquired absence of lung [part of]: Secondary | ICD-10-CM | POA: Diagnosis not present

## 2023-09-03 LAB — CMP (CANCER CENTER ONLY)
ALT: 17 U/L (ref 0–44)
AST: 14 U/L — ABNORMAL LOW (ref 15–41)
Albumin: 4.2 g/dL (ref 3.5–5.0)
Alkaline Phosphatase: 58 U/L (ref 38–126)
Anion gap: 4 — ABNORMAL LOW (ref 5–15)
BUN: 18 mg/dL (ref 8–23)
CO2: 31 mmol/L (ref 22–32)
Calcium: 10.1 mg/dL (ref 8.9–10.3)
Chloride: 107 mmol/L (ref 98–111)
Creatinine: 0.87 mg/dL (ref 0.44–1.00)
GFR, Estimated: >60 mL/min (ref >60)
Glucose, Bld: 104 mg/dL — ABNORMAL HIGH (ref 70–99)
Potassium: 4.6 mmol/L (ref 3.5–5.1)
Sodium: 142 mmol/L (ref 135–145)
Total Bilirubin: 0.5 mg/dL (ref 0.0–1.2)
Total Protein: 6.8 g/dL (ref 6.5–8.1)

## 2023-09-03 LAB — CBC WITH DIFFERENTIAL (CANCER CENTER ONLY)
Abs Immature Granulocytes: 0.03 10*3/uL (ref 0.00–0.07)
Basophils Absolute: 0 10*3/uL (ref 0.0–0.1)
Basophils Relative: 0 %
Eosinophils Absolute: 0.2 10*3/uL (ref 0.0–0.5)
Eosinophils Relative: 3 %
HCT: 46.1 % — ABNORMAL HIGH (ref 36.0–46.0)
Hemoglobin: 14.4 g/dL (ref 12.0–15.0)
Immature Granulocytes: 0 %
Lymphocytes Relative: 23 %
Lymphs Abs: 1.9 10*3/uL (ref 0.7–4.0)
MCH: 27.8 pg (ref 26.0–34.0)
MCHC: 31.2 g/dL (ref 30.0–36.0)
MCV: 89 fL (ref 80.0–100.0)
Monocytes Absolute: 0.5 10*3/uL (ref 0.1–1.0)
Monocytes Relative: 7 %
Neutro Abs: 5.3 10*3/uL (ref 1.7–7.7)
Neutrophils Relative %: 67 %
Platelet Count: 251 10*3/uL (ref 150–400)
RBC: 5.18 MIL/uL — ABNORMAL HIGH (ref 3.87–5.11)
RDW: 14.5 % (ref 11.5–15.5)
WBC Count: 8 10*3/uL (ref 4.0–10.5)
nRBC: 0 % (ref 0.0–0.2)

## 2023-09-03 MED ORDER — IOHEXOL 300 MG/ML  SOLN
75.0000 mL | Freq: Once | INTRAMUSCULAR | Status: AC | PRN
  Administered 2023-09-03: 75 mL via INTRAVENOUS

## 2023-09-08 ENCOUNTER — Inpatient Hospital Stay: Payer: 59 | Admitting: Internal Medicine

## 2023-09-08 VITALS — BP 127/55 | HR 92 | Temp 97.7°F | Resp 18 | Ht 64.0 in | Wt 180.0 lb

## 2023-09-08 DIAGNOSIS — C7A090 Malignant carcinoid tumor of the bronchus and lung: Secondary | ICD-10-CM | POA: Diagnosis not present

## 2023-09-08 DIAGNOSIS — C3490 Malignant neoplasm of unspecified part of unspecified bronchus or lung: Secondary | ICD-10-CM

## 2023-09-08 NOTE — Progress Notes (Signed)
[facility] [phone]   [phone]  OFFICE PROGRESS NOTE  CoxFrit[facility] Mandes, MD [address] [place] [address]  DIAGNOSIS:  1) stage Ia (T1a, N0, M0) typical carcinoid of the lung 2020 2) History of rectal carcinoid tumor in 2019   PRIOR THERAPY:  1) status post right middle lobectomy with lymph node dissection on December 30, 2018 under the care of Dr. Tyrone Sage.  2) The patient also has a history of rectal carcinoid tumor status post surgical resection in 2019.    CURRENT THERAPY: observation   INTERVAL HISTORY: Shannon [name] 63 y.o. female returns to the clinic today for 55-month follow-up visit.Discussed the use of AI scribe software for clinical note transcription with the patient, who gave verbal consent to proceed.  History of Present Illness   Shannon [name] is a 63 year old female with a history of stage 1A carcinoid tumor of the lung and rectal carcinoid who presents for follow-up due to new findings on imaging.  She was diagnosed with a stage 1A carcinoid tumor of the lung in 2020 and underwent a right medullobectomy for surgical resection. She has been under observation since the surgery. Recent imaging revealed a new ground glass opacity described as a 'hazy area', prompting an earlier follow-up than the usual annual visit. The area previously seen on imaging has improved.  She experiences some decline in her breathing, although a breathing test conducted last year was normal. No nausea, vomiting, diarrhea, or significant unintentional weight loss. Occasionally experiences hot flashes, which she is unsure if they are hormonal in nature.  She also has a history of rectal carcinoid diagnosed in 2019. No current symptoms or issues related to this condition were discussed.       MEDICAL HISTORY: Past Medical History:  Diagnosis Date   Anemia    takes iron supplements, Pt states anemia is thought to be r/t a bleeding  uterine polyp.   Anxiety    Follows w/ PCP, Dr. Harless Nakayama.   Arthritis    back, hips, shoulders, hands, knees   Asthma    Follows w/ pulmonology, Dr. [name].   Bronchial carcinoid tumors    s/p right middle lobe lobectomy   Cancer (HCC)    rectal, Lung   Chronic pain 02/2023   in multiple joints, Follows with [facility] rheumatology,   Dyspnea    with exertion   Emphysema of lung [facility])    Follows w/ pulmonology, Dr. [name].   Gastritis    Found on 10/2022 EGD. Following w/ Dr. Marina Goodell, GI.   GERD (gastroesophageal reflux disease)    History of kidney stones    passed   HLD (hyperlipidemia)    Follows w/ PCP, Dr. Harless Nakayama.   Hypertension    Follows w/ PCP, Benita Stabile @ [facility].   Pneumonia 02/2018   Rectal tumor    neuroendocrine carcinoma of rectum, follows w/ GI , Dr. Sherre Lain and Dr. Shirline Frees at [facility].   Seasonal allergies    Wears glasses     ALLERGIES:  is allergic to topamax [topiramate], hydrocodone, pravastatin, levaquin [levofloxacin in d5w], and other.  MEDICATIONS:  Current Outpatient Medications  Medication Sig Dispense Refill   acetaminophen (TYLENOL) 650 MG CR tablet Take 650 mg by mouth every 8 (eight) hours as needed for pain.     albuterol (VENTOLIN HFA) 108 (90 Base) MCG/ACT inhaler INHALE 2  PUFFS INTO THE LUNGS EVERY 6 HOURS AS NEEDED FOR WHEEZING OR SHORTNESS OF BREATH 6.7 g 1   busPIRone (BUSPAR) 10 MG tablet TAKE 1/2 TABLET BY MOUTH TWICE DAILY 180 tablet 1   calcium carbonate (TUMS - DOSED IN MG ELEMENTAL CALCIUM) 500 MG chewable tablet Chew 1 tablet by mouth daily as needed for indigestion or heartburn.     cetirizine (ZYRTEC ALLERGY) 10 MG tablet Take 1 tablet (10 mg total) by mouth daily as needed for allergies or rhinitis. 30 tablet 11   Cholecalciferol (VITAMIN D-3) 125 MCG (5000 UT) TABS Take 5,000 Units by mouth daily at 12 noon.     Cyanocobalamin (B-12) 2500 MCG TABS Take 2,500 mcg by  mouth daily.     cyclobenzaprine (FLEXERIL) 10 MG tablet Take 10 mg by mouth 3 (three) times daily as needed for muscle spasms.     ezetimibe (ZETIA) 10 MG tablet Take 1 tablet (10 mg total) by mouth daily. 90 tablet 3   ferrous sulfate 324 MG TBEC Take 324 mg by mouth daily with breakfast.     gabapentin (NEURONTIN) 300 MG capsule Take 300 mg by mouth at bedtime as needed (pain).     lisinopril (ZESTRIL) 20 MG tablet TAKE 1 TABLET(20 MG) BY MOUTH DAILY 90 tablet 1   LORazepam (ATIVAN) 1 MG tablet TAKE 1 TABLET(1 MG) BY MOUTH AT BEDTIME 90 tablet 0   omeprazole (PRILOSEC) 40 MG capsule TAKE 1 CAPSULE BY MOUTH 2 TIMES DAILY. TAKE 30-60 MINUTES BEFORE MEALS 60 capsule 5   predniSONE (DELTASONE) 5 MG tablet Take 1 tablet (5 mg total) by mouth daily as needed. 90 tablet 3   pregabalin (LYRICA) 25 MG capsule Take 1 capsule (25 mg total) by mouth at bedtime. 90 capsule 3   REPATHA SURECLICK 140 MG/ML SOAJ ADMINISTER 1 ML(140 MG) UNDER THE SKIN EVERY 14 DAYS 2 mL 1   Current Facility-Administered Medications  Medication Dose Route Frequency Provider Last Rate Last Admin   sodium chloride (PF) 0.9 % injection 2 mL  2 mL Intravenous Daily Raulkar, Drema Pry, MD   2 mL at 07/08/23 1054    SURGICAL HISTORY:  Past Surgical History:  Procedure Laterality Date   ABLATION     endometrial   ANTERIOR AND POSTERIOR REPAIR N/A 04/29/2023   Procedure: ANTERIOR (CYSTOCELE) AND POSTERIOR REPAIR (RECTOCELE);  Surgeon: Lavina Hamman, MD;  Location[facility];  Service: Gynecology;  Laterality: N/A;   BIOPSY  10/10/2018   Procedure: BIOPSY;  Surgeon: Meridee Score Netty Starring., MD;  Location: [facility] ENDOSCOPY;  Service: Gastroenterology;;   BIOPSY  06/12/2019   Procedure: BIOPSY;  Surgeon: Lemar Lofty., MD;  Location: [facility] ENDOSCOPY;  Service: Gastroenterology;;   BIOPSY  09/30/2020   Procedure: BIOPSY;  Surgeon: Lemar Lofty., MD;  Location: [facility] ENDOSCOPY;  Service:  Gastroenterology;;   BIOPSY  11/18/2022   Procedure: BIOPSY;  Surgeon: Lemar Lofty., MD;  Location: [facility] ENDOSCOPY;  Service: Gastroenterology;;   COLONOSCOPY     COLONOSCOPY WITH PROPOFOL N/A 11/18/2022   Procedure: COLONOSCOPY WITH PROPOFOL;  Surgeon: Lemar Lofty., MD;  Location: [facility] ENDOSCOPY;  Service: Gastroenterology;  Laterality: N/A;   CYSTOSCOPY N/A 04/29/2023   Procedure: CYSTOSCOPY;  Surgeon: Lavina Hamman, MD;  Location: [facility];  Service: Gynecology;  Laterality: N/A;   DOPPLER ECHOCARDIOGRAPHY     ENDOSCOPIC MUCOSAL RESECTION N/A 10/10/2018   Procedure: ENDOSCOPIC MUCOSAL RESECTION;  Surgeon: Meridee Score, Netty Starring., MD;  Location: [facility] ENDOSCOPY;  Service:  Gastroenterology;  Laterality: N/A;   ESOPHAGOGASTRODUODENOSCOPY N/A 11/18/2022   Procedure: ESOPHAGOGASTRODUODENOSCOPY (EGD);  Surgeon: Lemar Lofty., MD;  Location: [facility] ENDOSCOPY;  Service: Gastroenterology;  Laterality: N/A;   ESOPHAGOGASTRODUODENOSCOPY  03/30/2023   normal   EUS N/A 07/18/2018   Procedure: LOWER ENDOSCOPIC ULTRASOUND (EUS);  Surgeon: Lemar Lofty., MD;  Location: [facility] ENDOSCOPY;  Service: Gastroenterology;  Laterality: N/A;   EUS N/A 10/10/2018   Procedure: LOWER ENDOSCOPIC ULTRASOUND (EUS);  Surgeon: Lemar Lofty., MD;  Location: [facility] ENDOSCOPY;  Service: Gastroenterology;  Laterality: N/A;   EUS N/A 06/12/2019   Procedure: LOWER ENDOSCOPIC ULTRASOUND (EUS);  Surgeon: Lemar Lofty., MD;  Location: [facility] ENDOSCOPY;  Service: Gastroenterology;  Laterality: N/A;   EUS N/A 09/30/2020   Procedure: LOWER ENDOSCOPIC ULTRASOUND (EUS);  Surgeon: Lemar Lofty., MD;  Location: [facility] ENDOSCOPY;  Service: Gastroenterology;  Laterality: N/A;   EUS N/A 11/18/2022   Procedure: LOWER ENDOSCOPIC ULTRASOUND (EUS);  Surgeon: Lemar Lofty., MD;  Location: [facility] ENDOSCOPY;  Service: Gastroenterology;  Laterality: N/A;   FLEXIBLE SIGMOIDOSCOPY  N/A 07/18/2018   Procedure: FLEXIBLE SIGMOIDOSCOPY;  Surgeon: Meridee Score Netty Starring., MD;  Location: [facility] ENDOSCOPY;  Service: Gastroenterology;  Laterality: N/A;   FLEXIBLE SIGMOIDOSCOPY N/A 10/10/2018   Procedure: FLEXIBLE SIGMOIDOSCOPY;  Surgeon: Meridee Score Netty Starring., MD;  Location: [facility] ENDOSCOPY;  Service: Gastroenterology;  Laterality: N/A;   FLEXIBLE SIGMOIDOSCOPY N/A 06/12/2019   Procedure: FLEXIBLE SIGMOIDOSCOPY;  Surgeon: Meridee Score Netty Starring., MD;  Location: [facility] ENDOSCOPY;  Service: Gastroenterology;  Laterality: N/A;   FLEXIBLE SIGMOIDOSCOPY N/A 09/30/2020   Procedure: FLEXIBLE SIGMOIDOSCOPY;  Surgeon: Meridee Score Netty Starring., MD;  Location: [facility] ENDOSCOPY;  Service: Gastroenterology;  Laterality: N/A;   HEMOSTASIS CLIP PLACEMENT  10/10/2018   Procedure: HEMOSTASIS CLIP PLACEMENT;  Surgeon: Lemar Lofty., MD;  Location: [facility] ENDOSCOPY;  Service: Gastroenterology;;   LUNG CANCER SURGERY  12/30/2018   right lun- mid lobe , dr gerhardt   POLYPECTOMY  11/18/2022   Procedure: POLYPECTOMY;  Surgeon: Lemar Lofty., MD;  Location: [facility] ENDOSCOPY;  Service: Gastroenterology;;   Gaspar Bidding DILATION N/A 11/18/2022   Procedure: Jacklyn Shell;  Surgeon: Lemar Lofty., MD;  Location: [facility] ENDOSCOPY;  Service: Gastroenterology;  Laterality: N/A;   SUBMUCOSAL LIFTING INJECTION  10/10/2018   Procedure: SUBMUCOSAL LIFTING INJECTION;  Surgeon: Meridee Score Netty Starring., MD;  Location: [facility] ENDOSCOPY;  Service: Gastroenterology;;   TEE WITHOUT CARDIOVERSION  02/2018   TONSILLECTOMY     TOTAL HIP ARTHROPLASTY Left 11/2021   TOTAL LAPAROSCOPIC HYSTERECTOMY WITH BILATERAL SALPINGO OOPHORECTOMY Bilateral 04/29/2023   Procedure: TOTAL LAPAROSCOPIC HYSTERECTOMY WITH BILATERAL SALPINGO OOPHORECTOMY;  Surgeon: Lavina Hamman, MD;  Location: [facility];  Service: Gynecology;  Laterality: Bilateral;   TUBAL LIGATION  1987   VIDEO ASSISTED THORACOSCOPY (VATS)/WEDGE RESECTION  Right 12/30/2018   Procedure: VIDEO ASSISTED THORACOSCOPY (VATS), MINI THORACOTOMY, RIGHT MIDDLE LOBECTOMY WITH LYMPH NODE DISSECTION;  Surgeon: Delight Ovens, MD;  Location: [facility] OR;  Service: Thoracic;  Laterality: Right;   VIDEO BRONCHOSCOPY N/A 10/12/2018   Procedure: VIDEO BRONCHOSCOPY;  Surgeon: Delight Ovens, MD;  Location: [facility] OR;  Service: Thoracic;  Laterality: N/A;   VIDEO BRONCHOSCOPY N/A 12/30/2018   Procedure: VIDEO BRONCHOSCOPY;  Surgeon: Delight Ovens, MD;  Location: [facility] OR;  Service: Thoracic;  Laterality: N/A;   WISDOM TOOTH EXTRACTION      REVIEW OF SYSTEMS:  A comprehensive review of systems was negative except for: Respiratory: positive for dyspnea on exertion   PHYSICAL EXAMINATION: General appearance: alert, cooperative,  and no distress Head: Normocephalic, without obvious abnormality, atraumatic Neck: no adenopathy, no JVD, supple, symmetrical, trachea midline, and thyroid not enlarged, symmetric, no tenderness/mass/nodules Lymph nodes: Cervical, supraclavicular, and axillary nodes normal. Resp: clear to auscultation bilaterally Back: symmetric, no curvature. ROM normal. No CVA tenderness. Cardio: regular rate and rhythm, S1, S2 normal, no murmur, click, rub or gallop GI: soft, non-tender; bowel sounds normal; no masses,  no organomegaly Extremities: extremities normal, atraumatic, no cyanosis or edema  ECOG PERFORMANCE STATUS: 1 - Symptomatic but completely ambulatory  Blood pressure (!) 127/55, pulse 92, temperature 97.7 F (36.5 C), temperature source Tympanic, resp. rate 18, height 5\' 4"  (1.626 m), weight 180 lb (81.6 kg), SpO2 100%.  LABORATORY DATA: Lab Results  Component Value Date   WBC 8.0 09/03/2023   HGB 14.4 09/03/2023   HCT 46.1 (H) 09/03/2023   MCV 89.0 09/03/2023   PLT 251 09/03/2023      Chemistry      Component Value Date/Time   NA 142 09/03/2023 0829   NA 144 08/12/2023 0745   K 4.6 09/03/2023 0829   CL 107 09/03/2023 0829    CO2 31 09/03/2023 0829   BUN 18 09/03/2023 0829   BUN 17 08/12/2023 0745   CREATININE 0.87 09/03/2023 0829      Component Value Date/Time   CALCIUM 10.1 09/03/2023 0829   ALKPHOS 58 09/03/2023 0829   AST 14 (L) 09/03/2023 0829   ALT 17 09/03/2023 0829   BILITOT 0.5 09/03/2023 0829       RADIOGRAPHIC STUDIES: CT Chest W Contrast Result Date: 09/07/2023 CLINICAL DATA:  Carcinoid bronchial adenoma. Follow up lung nodules. * Tracking Code: BO * EXAM: CT CHEST WITH CONTRAST TECHNIQUE: Multidetector CT imaging of the chest was performed during intravenous contrast administration. RADIATION DOSE REDUCTION: This exam was performed according to the departmental dose-optimization program which includes automated exposure control, adjustment of the mA and/or kV according to patient size and/or use of iterative reconstruction technique. CONTRAST:  75mL OMNIPAQUE IOHEXOL 300 MG/ML  SOLN COMPARISON:  CT 02/18/2023 FINDINGS: Cardiovascular: Heart is nonenlarged. No pericardial effusion. The thoracic aorta is normal course and caliber with mild atherosclerotic plaque. Partially calcified. Mediastinum/Nodes: Small hiatal hernia. No specific abnormal lymph node enlargement identified in the axillary regions, hilum or mediastinum. Small thyroid gland. Lungs/Pleura: Centrilobular emphysematous changes are identified. Small nodules in the lingula are stable. Two adjacent foci once again seen with the larger measuring 5 mm on series 6, image 88. No additional left-sided lung nodule. Minimal patchy ground-glass and subtle nodularity is new at the right lower lobe with dominant focus on series 6 image 125 measuring 4 mm. Stable surgical changes along the anteromedial right hemithorax. Small calcified right apical nodule stable on image 38 of series 6. grossly patent central airways. Upper Abdomen: Stable adrenal glands with slight nodularity on the left. Musculoskeletal: Mild degenerative changes along the spine.  IMPRESSION: Stable small lingular lung nodules. Smaller area of nodular ground-glass in the right lower lobe is new. Recommend continued follow up in 3-6 months. Small hiatal hernia. Aortic Atherosclerosis (ICD10-I70.0) and Emphysema (ICD10-J43.9). Electronically Signed   By: Karen Kays M.D.   On: 09/07/2023 17:27    ASSESSMENT AND PLAN: This is a very pleasant 63 years old white female with history of stage Ia typical carcinoid of the lung diagnosed in 2020 in addition to history of rectal carcinoid tumor diagnosed in 2019.  She is status post surgical resection of the rectal carcinoid tumor as well as  right middle lobectomy with lymph node dissection on December 30, 2018.     Stage 1A Carcinoid Tumor of the Lung Diagnosed in 2020, underwent right medullobectomy. Recent imaging showed a ground glass opacity, potentially indicating inflammation or early lung cancer. Due to the slow-growing nature of carcinoid tumors, a follow-up scan in six months is warranted to rule out other malignancies. - Order follow-up scan in six months.  Dyspnea Reports worsening dyspnea despite normal pulmonary function test last year. Possible progression of underlying conditions.  Rectal Carcinoid Diagnosed in 2019. Currently asymptomatic.  General Health Maintenance No significant weight loss, nausea, vomiting, or diarrhea. Occasional hot flashes, possibly hormonal. No leg swelling. Physical exam unremarkable.  Follow-up - Schedule follow-up appointment in six months.   She was advised to call immediately if she has any other concerning symptoms in the interval. The patient voices understanding of current disease status and treatment options and is in agreement with the current care plan.  All questions were answered. The patient knows to call the clinic with any problems, questions or concerns. We can certainly see the patient much sooner if necessary.  The total time spent in the appointment was 20  minutes.  Disclaimer: This note was dictated with voice recognition software. Similar sounding words can inadvertently be transcribed and may not be corrected upon review.

## 2023-09-09 ENCOUNTER — Ambulatory Visit: Payer: BC Managed Care – PPO | Admitting: Physical Medicine and Rehabilitation

## 2023-09-16 ENCOUNTER — Ambulatory Visit: Payer: Self-pay | Admitting: Physical Medicine and Rehabilitation

## 2023-09-24 ENCOUNTER — Ambulatory Visit: Payer: Self-pay | Admitting: Physical Medicine and Rehabilitation

## 2023-10-13 ENCOUNTER — Other Ambulatory Visit: Payer: Self-pay | Admitting: Physician Assistant

## 2023-10-13 DIAGNOSIS — I10 Essential (primary) hypertension: Secondary | ICD-10-CM

## 2023-10-21 ENCOUNTER — Telehealth: Payer: Self-pay | Admitting: Internal Medicine

## 2023-10-21 NOTE — Telephone Encounter (Signed)
 Patient left a voicemail to reschedule appointment. Left the patient a voicemail with updated appointment details rescheduled around a scan. The patient will be mailed an appointment reminder.

## 2023-10-30 ENCOUNTER — Other Ambulatory Visit: Payer: Self-pay

## 2023-10-30 DIAGNOSIS — F419 Anxiety disorder, unspecified: Secondary | ICD-10-CM

## 2023-11-10 ENCOUNTER — Other Ambulatory Visit: Payer: Self-pay | Admitting: Internal Medicine

## 2023-11-12 ENCOUNTER — Other Ambulatory Visit: Payer: Self-pay | Admitting: Physician Assistant

## 2023-12-13 ENCOUNTER — Ambulatory Visit: Payer: BC Managed Care – PPO | Admitting: Physician Assistant

## 2024-01-30 ENCOUNTER — Other Ambulatory Visit: Payer: Self-pay

## 2024-01-30 DIAGNOSIS — F419 Anxiety disorder, unspecified: Secondary | ICD-10-CM

## 2024-02-03 DIAGNOSIS — Z90710 Acquired absence of both cervix and uterus: Secondary | ICD-10-CM | POA: Insufficient documentation

## 2024-02-07 ENCOUNTER — Encounter: Payer: Self-pay | Admitting: Physician Assistant

## 2024-02-07 ENCOUNTER — Ambulatory Visit: Admitting: Physician Assistant

## 2024-02-07 VITALS — BP 110/80 | HR 70 | Temp 98.1°F | Ht 64.0 in | Wt 180.2 lb

## 2024-02-07 DIAGNOSIS — C7A090 Malignant carcinoid tumor of the bronchus and lung: Secondary | ICD-10-CM

## 2024-02-07 DIAGNOSIS — K2100 Gastro-esophageal reflux disease with esophagitis, without bleeding: Secondary | ICD-10-CM

## 2024-02-07 DIAGNOSIS — I739 Peripheral vascular disease, unspecified: Secondary | ICD-10-CM | POA: Insufficient documentation

## 2024-02-07 DIAGNOSIS — I10 Essential (primary) hypertension: Secondary | ICD-10-CM

## 2024-02-07 DIAGNOSIS — C7A026 Malignant carcinoid tumor of the rectum: Secondary | ICD-10-CM

## 2024-02-07 DIAGNOSIS — F419 Anxiety disorder, unspecified: Secondary | ICD-10-CM

## 2024-02-07 DIAGNOSIS — Z23 Encounter for immunization: Secondary | ICD-10-CM

## 2024-02-07 DIAGNOSIS — I70203 Unspecified atherosclerosis of native arteries of extremities, bilateral legs: Secondary | ICD-10-CM | POA: Insufficient documentation

## 2024-02-07 DIAGNOSIS — E782 Mixed hyperlipidemia: Secondary | ICD-10-CM

## 2024-02-07 DIAGNOSIS — E559 Vitamin D deficiency, unspecified: Secondary | ICD-10-CM | POA: Diagnosis not present

## 2024-02-07 MED ORDER — REPATHA SURECLICK 140 MG/ML ~~LOC~~ SOAJ: SUBCUTANEOUS | 3 refills | Status: DC

## 2024-02-07 NOTE — Progress Notes (Signed)
 Subjective:  Patient ID: Shannon [name], female    DOB: Jun 09, 1961  Age: 63 y.o. MRN: [id number]  Chief Complaint  Patient presents with   Medical Management of Chronic Issues    HPI Pt presents for follow up of hypertension.  The patient is tolerating the medication well without side effects. Compliance with treatment has been good; including taking medication as directed , maintains a healthy diet and regular exercise regimen , and following up as directed. Pt taking zestril  20mg  qd Denies chest pain/shortness of breath  Mixed hyperlipidemia  Pt presents with hyperlipidemia. Compliance with treatment has been good -The patient is compliant with medications, maintains a low cholesterol diet , follows up as directed , and maintains an exercise regimen . The patient denies experiencing any hypercholesterolemia related symptoms. She is currently taking repatha  and zetia  --- she did stop repatha  for a little while but has now restarted  Pt with GERD -- stable on omeprazole  40mg  qd  Pt with anxiety - symptoms stable on ativan  and buspar   Pt with Vit  D def - is taking daily supplement - due for labwork  Pt with carcinoid tumor of rectum and follows with Dr Wilhelmenia Pt with carcinoid bronchial adenoma - follows with Dr Gatha  Pt requests pneumonia vaccine     02/07/2024    9:50 AM 05/31/2023    8:29 AM 05/18/2023    9:55 AM 04/19/2023   10:44 AM 11/16/2022    9:07 AM  Depression screen PHQ 2/9  Decreased Interest 0 0 0 0 0  Down, Depressed, Hopeless 0 0 0 0 0  PHQ - 2 Score 0 0 0 0 0  Altered sleeping  0 0 0   Tired, decreased energy  0 1 1   Change in appetite  0 0 0   Feeling bad or failure about yourself   0 0 0   Trouble concentrating  0 0 0   Moving slowly or fidgety/restless  0 0 0   Suicidal thoughts  0 0 0   PHQ-9 Score  0 1 1   Difficult doing work/chores  Not difficult at all  Not difficult at all         11/16/2022    9:07 AM 04/19/2023   10:44 AM 05/18/2023     9:54 AM 05/31/2023    8:29 AM 02/07/2024    9:50 AM  Fall Risk  Falls in the past year? 0 0 0 0 0  Was there an injury with Fall? 0 0 0 0 0  Fall Risk Category Calculator 0 0 0 0 0  Patient at Risk for Falls Due to History of fall(s) No Fall Risks  No Fall Risks No Fall Risks  Fall risk Follow up Falls evaluation completed Falls evaluation completed  Falls evaluation completed     CONSTITUTIONAL: Negative for chills, fatigue, fever, unintentional weight gain and unintentional weight loss.  E/N/T: Negative for ear pain, nasal congestion and sore throat.  CARDIOVASCULAR: Negative for chest pain, dizziness, palpitations and pedal edema.  RESPIRATORY: Negative for recent cough and dyspnea.  GASTROINTESTINAL: Negative for abdominal pain, acid reflux symptoms, constipation, diarrhea, nausea and vomiting.  MSK: Negative for arthralgias and myalgias.  INTEGUMENTARY: Negative for rash.  NEUROLOGICAL: Negative for dizziness and headaches.  PSYCHIATRIC: Negative for sleep disturbance and to question depression screen.  Negative for depression, negative for anhedonia.       Current Outpatient Medications:    albuterol  (VENTOLIN  HFA) 108 (90 Base) MCG/ACT  inhaler, INHALE 2 PUFFS INTO THE LUNGS EVERY 6 HOURS AS NEEDED FOR WHEEZING OR SHORTNESS OF BREATH, Disp: 6.7 g, Rfl: 1   busPIRone  (BUSPAR ) 10 MG tablet, TAKE 1/2 TABLET BY MOUTH TWICE DAILY, Disp: 180 tablet, Rfl: 1   calcium  carbonate (TUMS - DOSED IN MG ELEMENTAL CALCIUM ) 500 MG chewable tablet, Chew 1 tablet by mouth daily as needed for indigestion or heartburn., Disp: , Rfl:    Cholecalciferol (VITAMIN D -3) 125 MCG (5000 UT) TABS, Take 5,000 Units by mouth daily at 12 noon., Disp: , Rfl:    Cyanocobalamin (B-12) 2500 MCG TABS, Take 2,500 mcg by mouth daily., Disp: , Rfl:    cyclobenzaprine (FLEXERIL) 10 MG tablet, Take 10 mg by mouth 3 (three) times daily as needed for muscle spasms., Disp: , Rfl:    ezetimibe  (ZETIA ) 10 MG tablet, Take 1  tablet (10 mg total) by mouth daily., Disp: 90 tablet, Rfl: 3   gabapentin  (NEURONTIN ) 300 MG capsule, Take 300 mg by mouth at bedtime as needed (pain)., Disp: , Rfl:    lisinopril  (ZESTRIL ) 20 MG tablet, TAKE 1 TABLET(20 MG) BY MOUTH DAILY, Disp: 90 tablet, Rfl: 1   LORazepam  (ATIVAN ) 1 MG tablet, TAKE 1 TABLET(1 MG) BY MOUTH AT BEDTIME, Disp: 90 tablet, Rfl: 0   omeprazole  (PRILOSEC) 40 MG capsule, TAKE 1 CAPSULE BY MOUTH TWICE DAILY 30-60 MINUTES BEFORE MEALS, Disp: 60 capsule, Rfl: 10   Evolocumab  (REPATHA  SURECLICK) 140 MG/ML SOAJ, ADMINISTER 1 ML(140 MG) UNDER THE SKIN EVERY 14 DAYS, Disp: 2 mL, Rfl: 3  Current Facility-Administered Medications:    sodium chloride  (PF) 0.9 % injection 2 mL, 2 mL, Intravenous, Daily, Raulkar, Sven SQUIBB, MD, 2 mL at 07/08/23 1054  Past Medical History:  Diagnosis Date   Anemia    takes iron supplements, Pt states anemia is thought to be r/t a bleeding uterine polyp.   Anxiety    Follows w/ PCP, Dr. Josette Moats.   Arthritis    back, hips, shoulders, hands, knees   Asthma    Follows w/ pulmonology, Dr. Acquanetta Slater Staff.   Bronchial carcinoid tumors    s/p right middle lobe lobectomy   Cancer (HCC)    rectal, Lung   Chronic pain 02/2023   in multiple joints, Follows with [facility] rheumatology,   Dyspnea    with exertion   Emphysema of lung [facility])    Follows w/ pulmonology, Dr. Acquanetta Slater Staff.   Gastritis    Found on 10/2022 EGD. Following w/ Dr. Abran, GI.   GERD (gastroesophageal reflux disease)    History of kidney stones    passed   HLD (hyperlipidemia)    Follows w/ PCP, Dr. Josette Moats.   Hypertension    Follows w/ PCP, Josette Moats COME @ [facility].   Pneumonia 02/2018   Rectal tumor    neuroendocrine carcinoma of rectum, follows w/ GI , Dr. Pamalee and Dr. Gatha at [facility].   Seasonal allergies    Wears glasses    Objective:  PHYSICAL EXAM:   VS: BP 110/80   Pulse 70   Temp 98.1 F (36.7 C)    Ht 5' 4 (1.626 m)   Wt 180 lb 3.2 oz (81.7 kg)   LMP  (LMP Unknown)   SpO2 96%   BMI 30.93 kg/m   GEN: Well nourished, well developed, in no acute distress   Cardiac: RRR; no murmurs,  Respiratory:  normal respiratory rate and pattern with no distress -  normal breath sounds with no rales, rhonchi, wheezes or rubs  MS: no deformity or atrophy  Skin: warm and dry, no rash  Neuro:  Alert and Oriented x 3, - CN II-Xii grossly intact Psych: euthymic mood, appropriate affect and demeanor   Assessment & Plan:    Primary hypertension -     CBC with Differential/Platelet -     Comprehensive metabolic panel -     TSH Continue meds   Gastroesophageal reflux disease with esophagitis without hemorrhage Continue prilosec Mixed hyperlipidemia -     Comprehensive metabolic panel -     Lipid panel Continue meds Vitamin D  insufficiency -     VITAMIN D  25 Hydroxy (Vit-D Deficiency, Fractures)  Carcinoid bronchial adenoma, right (HCC) Follow up with specialist Malignant carcinoid tumor of rectum (HCC) Follow up with specialist Anxiety -     busPIRone  HCl; TAKE 1/2 TABLET BY MOUTH TWICE DAILY  Dispense: 180 tablet; Refill: 1 Continue ativan   Need pneumonia vaccine Prevnar 20 given    Follow-up: Return in about 6 months (around 08/09/2024) for fasting physical.  An After Visit Summary was printed and given to the patient.  CAMIE JONELLE NICHOLAUS DEVONNA [facility] [phone]

## 2024-02-08 ENCOUNTER — Ambulatory Visit: Payer: Self-pay | Admitting: Physician Assistant

## 2024-02-08 LAB — COMPREHENSIVE METABOLIC PANEL WITH GFR
ALT: 21 IU/L (ref 0–32)
AST: 24 IU/L (ref 0–40)
Albumin: 4.4 g/dL (ref 3.9–4.9)
Alkaline Phosphatase: 76 IU/L (ref 44–121)
BUN/Creatinine Ratio: 23 (ref 12–28)
BUN: 20 mg/dL (ref 8–27)
Bilirubin Total: 0.4 mg/dL (ref 0.0–1.2)
CO2: 23 mmol/L (ref 20–29)
Calcium: 10 mg/dL (ref 8.7–10.3)
Chloride: 102 mmol/L (ref 96–106)
Creatinine, Ser: 0.88 mg/dL (ref 0.57–1.00)
Globulin, Total: 2 g/dL (ref 1.5–4.5)
Glucose: 85 mg/dL (ref 70–99)
Potassium: 4.5 mmol/L (ref 3.5–5.2)
Sodium: 140 mmol/L (ref 134–144)
Total Protein: 6.4 g/dL (ref 6.0–8.5)
eGFR: 74 mL/min/1.73 (ref >59)

## 2024-02-08 LAB — CBC WITH DIFFERENTIAL/PLATELET
Basophils Absolute: 0 x10E3/uL (ref 0.0–0.2)
Basos: 1 %
EOS (ABSOLUTE): 0.2 x10E3/uL (ref 0.0–0.4)
Eos: 3 %
Hematocrit: 46 % (ref 34.0–46.6)
Hemoglobin: 14.4 g/dL (ref 11.1–15.9)
Immature Grans (Abs): 0 x10E3/uL (ref 0.0–0.1)
Immature Granulocytes: 0 %
Lymphocytes Absolute: 1.9 x10E3/uL (ref 0.7–3.1)
Lymphs: 29 %
MCH: 27.8 pg (ref 26.6–33.0)
MCHC: 31.3 g/dL — ABNORMAL LOW (ref 31.5–35.7)
MCV: 89 fL (ref 79–97)
Monocytes Absolute: 0.5 x10E3/uL (ref 0.1–0.9)
Monocytes: 8 %
Neutrophils Absolute: 4 x10E3/uL (ref 1.4–7.0)
Neutrophils: 59 %
Platelets: 249 x10E3/uL (ref 150–450)
RBC: 5.18 x10E6/uL (ref 3.77–5.28)
RDW: 13.1 % (ref 11.7–15.4)
WBC: 6.6 x10E3/uL (ref 3.4–10.8)

## 2024-02-08 LAB — LIPID PANEL
Chol/HDL Ratio: 2.2 ratio (ref 0.0–4.4)
Cholesterol, Total: 161 mg/dL (ref 100–199)
HDL: 74 mg/dL (ref >39)
LDL Chol Calc (NIH): 68 mg/dL (ref 0–99)
Triglycerides: 110 mg/dL (ref 0–149)
VLDL Cholesterol Cal: 19 mg/dL (ref 5–40)

## 2024-02-08 LAB — TSH: TSH: 1.82 u[IU]/mL (ref 0.450–4.500)

## 2024-02-08 LAB — VITAMIN D 25 HYDROXY (VIT D DEFICIENCY, FRACTURES): Vit D, 25-Hydroxy: 60.4 ng/mL (ref 30.0–100.0)

## 2024-02-28 ENCOUNTER — Other Ambulatory Visit: Payer: 59

## 2024-03-02 ENCOUNTER — Inpatient Hospital Stay: Attending: Internal Medicine

## 2024-03-02 ENCOUNTER — Ambulatory Visit (HOSPITAL_COMMUNITY)
Admission: RE | Admit: 2024-03-02 | Discharge: 2024-03-02 | Disposition: A | Discharge status: Home / Self Care | Source: Ambulatory Visit | Attending: Internal Medicine | Admitting: Internal Medicine

## 2024-03-02 DIAGNOSIS — J4489 Other specified chronic obstructive pulmonary disease: Secondary | ICD-10-CM | POA: Insufficient documentation

## 2024-03-02 DIAGNOSIS — Z85110 Personal history of malignant carcinoid tumor of bronchus and lung: Secondary | ICD-10-CM | POA: Diagnosis present

## 2024-03-02 DIAGNOSIS — Z902 Acquired absence of lung [part of]: Secondary | ICD-10-CM | POA: Insufficient documentation

## 2024-03-02 DIAGNOSIS — Z85040 Personal history of malignant carcinoid tumor of rectum: Secondary | ICD-10-CM | POA: Diagnosis present

## 2024-03-02 DIAGNOSIS — C3490 Malignant neoplasm of unspecified part of unspecified bronchus or lung: Secondary | ICD-10-CM | POA: Diagnosis present

## 2024-03-02 LAB — CMP (CANCER CENTER ONLY)
ALT: 16 U/L (ref 0–44)
AST: 18 U/L (ref 15–41)
Albumin: 4.3 g/dL (ref 3.5–5.0)
Alkaline Phosphatase: 62 U/L (ref 38–126)
Anion gap: 4 — ABNORMAL LOW (ref 5–15)
BUN: 18 mg/dL (ref 8–23)
CO2: 32 mmol/L (ref 22–32)
Calcium: 9.8 mg/dL (ref 8.9–10.3)
Chloride: 105 mmol/L (ref 98–111)
Creatinine: 0.85 mg/dL (ref 0.44–1.00)
GFR, Estimated: >60 mL/min (ref >60)
Glucose, Bld: 59 mg/dL — ABNORMAL LOW (ref 70–99)
Potassium: 3.7 mmol/L (ref 3.5–5.1)
Sodium: 141 mmol/L (ref 135–145)
Total Bilirubin: 0.5 mg/dL (ref 0.0–1.2)
Total Protein: 6.7 g/dL (ref 6.5–8.1)

## 2024-03-02 LAB — CBC WITH DIFFERENTIAL (CANCER CENTER ONLY)
Abs Immature Granulocytes: 0.02 K/uL (ref 0.00–0.07)
Basophils Absolute: 0 K/uL (ref 0.0–0.1)
Basophils Relative: 1 %
Eosinophils Absolute: 0.2 K/uL (ref 0.0–0.5)
Eosinophils Relative: 3 %
HCT: 44.9 % (ref 36.0–46.0)
Hemoglobin: 14.5 g/dL (ref 12.0–15.0)
Immature Granulocytes: 0 %
Lymphocytes Relative: 33 %
Lymphs Abs: 1.9 K/uL (ref 0.7–4.0)
MCH: 28 pg (ref 26.0–34.0)
MCHC: 32.3 g/dL (ref 30.0–36.0)
MCV: 86.7 fL (ref 80.0–100.0)
Monocytes Absolute: 0.4 K/uL (ref 0.1–1.0)
Monocytes Relative: 7 %
Neutro Abs: 3.2 K/uL (ref 1.7–7.7)
Neutrophils Relative %: 56 %
Platelet Count: 255 K/uL (ref 150–400)
RBC: 5.18 MIL/uL — ABNORMAL HIGH (ref 3.87–5.11)
RDW: 14.6 % (ref 11.5–15.5)
WBC Count: 5.8 K/uL (ref 4.0–10.5)
nRBC: 0 % (ref 0.0–0.2)

## 2024-03-02 MED ORDER — IOHEXOL 300 MG/ML  SOLN
75.0000 mL | Freq: Once | INTRAMUSCULAR | Status: AC | PRN
  Administered 2024-03-02: 75 mL via INTRAVENOUS

## 2024-03-06 ENCOUNTER — Ambulatory Visit: Payer: 59 | Admitting: Internal Medicine

## 2024-03-13 ENCOUNTER — Inpatient Hospital Stay: Admitting: Internal Medicine

## 2024-03-13 VITALS — BP 126/70 | HR 75 | Temp 97.8°F | Resp 17 | Ht 64.0 in | Wt 182.0 lb

## 2024-03-13 DIAGNOSIS — C7A090 Malignant carcinoid tumor of the bronchus and lung: Secondary | ICD-10-CM | POA: Diagnosis not present

## 2024-03-13 DIAGNOSIS — Z85110 Personal history of malignant carcinoid tumor of bronchus and lung: Secondary | ICD-10-CM | POA: Diagnosis not present

## 2024-03-13 NOTE — Progress Notes (Signed)
 [facility] [phone]   [phone]  OFFICE PROGRESS NOTE  [name], PA-C [address] [place] [address]  DIAGNOSIS:  1) stage Ia (T1a, N0, M0) typical carcinoid of the lung 2020 2) History of rectal carcinoid tumor in 2019   PRIOR THERAPY:  1) status post right middle lobectomy with lymph node dissection on December 30, 2018 under the care of Dr. Army.  2) The patient also has a history of rectal carcinoid tumor status post surgical resection in 2019.    CURRENT THERAPY: observation   INTERVAL HISTORY: Shannon [name] 63 y.o. female returns to the clinic today for 75-month follow-up visit. Discussed the use of AI scribe software for clinical note transcription with the patient, who gave verbal consent to proceed.  History of Present Illness Shannon [name] is a 63 year old female who presents for evaluation with repeat CT scan of the chest for restaging of her disease.  She has a history of stage 1A typical carcinoid tumor of the lung, diagnosed in 2020, for which she underwent a lobectomy with lymph node dissection in June 2020. She is currently under observation for this condition. A previous CT scan showed a ground glass opacity.  She also has a history of rectal carcinoid diagnosed in 2019, which was surgically resected. She reports no significant issues related to the rectal carcinoid, although her bowel movements are 'a bit different' following a repair for a prolapsed rectum.  Her breathing is 'getting a bit worse,' which she attributes to her COPD.  Her current medications include an additional cholesterol medication prescribed by her cardiologist, which has improved her cholesterol levels.    MEDICAL HISTORY: Past Medical History:  Diagnosis Date   Anemia    takes iron supplements, Pt states anemia is thought to be r/t a bleeding uterine polyp.   Anxiety    Follows w/ PCP, Dr. Josette Nicholaus.   Arthritis     back, hips, shoulders, hands, knees   Asthma    Follows w/ pulmonology, Dr. Acquanetta Slater Staff.   Bronchial carcinoid tumors    s/p right middle lobe lobectomy   Cancer (HCC)    rectal, Lung   Chronic pain 02/2023   in multiple joints, Follows with [facility] rheumatology,   Dyspnea    with exertion   Emphysema of lung [facility])    Follows w/ pulmonology, Dr. Acquanetta Slater Staff.   Gastritis    Found on 10/2022 EGD. Following w/ Dr. Abran, GI.   GERD (gastroesophageal reflux disease)    History of kidney stones    passed   HLD (hyperlipidemia)    Follows w/ PCP, Dr. Josette Nicholaus.   Hypertension    Follows w/ PCP, Josette Nicholaus COME @ [facility].   Pneumonia 02/2018   Rectal tumor    neuroendocrine carcinoma of rectum, follows w/ GI , Dr. Pamalee and Dr. Gatha at [facility].   Seasonal allergies    Wears glasses     ALLERGIES:  is allergic to topamax  [topiramate ], hydrocodone , pravastatin, levaquin [levofloxacin in d5w], and other.  MEDICATIONS:  Current Outpatient Medications  Medication Sig Dispense Refill   albuterol  (VENTOLIN  HFA) 108 (90 Base) MCG/ACT inhaler INHALE 2 PUFFS INTO THE LUNGS EVERY 6 HOURS AS NEEDED FOR WHEEZING OR SHORTNESS OF BREATH 6.7 g 1   busPIRone  (BUSPAR ) 10 MG tablet TAKE 1/2 TABLET BY MOUTH TWICE DAILY 180 tablet 1   calcium   carbonate (TUMS - DOSED IN MG ELEMENTAL CALCIUM ) 500 MG chewable tablet Chew 1 tablet by mouth daily as needed for indigestion or heartburn.     Cholecalciferol (VITAMIN D -3) 125 MCG (5000 UT) TABS Take 5,000 Units by mouth daily at 12 noon.     Cyanocobalamin (B-12) 2500 MCG TABS Take 2,500 mcg by mouth daily.     cyclobenzaprine (FLEXERIL) 10 MG tablet Take 10 mg by mouth 3 (three) times daily as needed for muscle spasms.     Evolocumab  (REPATHA  SURECLICK) 140 MG/ML SOAJ ADMINISTER 1 ML(140 MG) UNDER THE SKIN EVERY 14 DAYS 2 mL 3   ezetimibe  (ZETIA ) 10 MG tablet Take 1 tablet (10 mg total) by mouth daily. 90 tablet 3    gabapentin  (NEURONTIN ) 300 MG capsule Take 300 mg by mouth at bedtime as needed (pain).     lisinopril  (ZESTRIL ) 20 MG tablet TAKE 1 TABLET(20 MG) BY MOUTH DAILY 90 tablet 1   LORazepam  (ATIVAN ) 1 MG tablet TAKE 1 TABLET(1 MG) BY MOUTH AT BEDTIME 90 tablet 0   omeprazole  (PRILOSEC) 40 MG capsule TAKE 1 CAPSULE BY MOUTH TWICE DAILY 30-60 MINUTES BEFORE MEALS 60 capsule 10   Current Facility-Administered Medications  Medication Dose Route Frequency Provider Last Rate Last Admin   sodium chloride  (PF) 0.9 % injection 2 mL  2 mL Intravenous Daily Raulkar, Sven SQUIBB, MD   2 mL at 07/08/23 1054    SURGICAL HISTORY:  Past Surgical History:  Procedure Laterality Date   ABLATION     endometrial   ANTERIOR AND POSTERIOR REPAIR N/A 04/29/2023   Procedure: ANTERIOR (CYSTOCELE) AND POSTERIOR REPAIR (RECTOCELE);  Surgeon: Horacio Boas, MD;  Location[facility];  Service: Gynecology;  Laterality: N/A;   BIOPSY  10/10/2018   Procedure: BIOPSY;  Surgeon: Wilhelmenia Aloha Raddle., MD;  Location: [facility] ENDOSCOPY;  Service: Gastroenterology;;   BIOPSY  06/12/2019   Procedure: BIOPSY;  Surgeon: Wilhelmenia Aloha Raddle., MD;  Location: [facility] ENDOSCOPY;  Service: Gastroenterology;;   BIOPSY  09/30/2020   Procedure: BIOPSY;  Surgeon: Wilhelmenia Aloha Raddle., MD;  Location: [facility];  Service: Gastroenterology;;   BIOPSY  11/18/2022   Procedure: BIOPSY;  Surgeon: Wilhelmenia Aloha Raddle., MD;  Location: [facility];  Service: Gastroenterology;;   COLONOSCOPY     COLONOSCOPY WITH PROPOFOL  N/A 11/18/2022   Procedure: COLONOSCOPY WITH PROPOFOL ;  Surgeon: Wilhelmenia Aloha Raddle., MD;  Location: [facility] ENDOSCOPY;  Service: Gastroenterology;  Laterality: N/A;   CYSTOSCOPY N/A 04/29/2023   Procedure: CYSTOSCOPY;  Surgeon: Horacio Boas, MD;  Location[facility];  Service: Gynecology;  Laterality: N/A;   DOPPLER ECHOCARDIOGRAPHY     ENDOSCOPIC MUCOSAL RESECTION N/A 10/10/2018    Procedure: ENDOSCOPIC MUCOSAL RESECTION;  Surgeon: Wilhelmenia, Aloha Raddle., MD;  Location: [facility] ENDOSCOPY;  Service: Gastroenterology;  Laterality: N/A;   ESOPHAGOGASTRODUODENOSCOPY N/A 11/18/2022   Procedure: ESOPHAGOGASTRODUODENOSCOPY (EGD);  Surgeon: Wilhelmenia Aloha Raddle., MD;  Location: [facility];  Service: Gastroenterology;  Laterality: N/A;   ESOPHAGOGASTRODUODENOSCOPY  03/30/2023   normal   EUS N/A 07/18/2018   Procedure: LOWER ENDOSCOPIC ULTRASOUND (EUS);  Surgeon: Wilhelmenia Aloha Raddle., MD;  Location: [facility] ENDOSCOPY;  Service: Gastroenterology;  Laterality: N/A;   EUS N/A 10/10/2018   Procedure: LOWER ENDOSCOPIC ULTRASOUND (EUS);  Surgeon: Wilhelmenia Aloha Raddle., MD;  Location: [facility] ENDOSCOPY;  Service: Gastroenterology;  Laterality: N/A;   EUS N/A 06/12/2019   Procedure: LOWER ENDOSCOPIC ULTRASOUND (EUS);  Surgeon: Wilhelmenia Aloha Raddle., MD;  Location: [facility] ENDOSCOPY;  Service: Gastroenterology;  Laterality: N/A;  EUS N/A 09/30/2020   Procedure: LOWER ENDOSCOPIC ULTRASOUND (EUS);  Surgeon: Wilhelmenia Aloha Raddle., MD;  Location: [facility];  Service: Gastroenterology;  Laterality: N/A;   EUS N/A 11/18/2022   Procedure: LOWER ENDOSCOPIC ULTRASOUND (EUS);  Surgeon: Wilhelmenia Aloha Raddle., MD;  Location: [facility];  Service: Gastroenterology;  Laterality: N/A;   FLEXIBLE SIGMOIDOSCOPY N/A 07/18/2018   Procedure: FLEXIBLE SIGMOIDOSCOPY;  Surgeon: Wilhelmenia Aloha Raddle., MD;  Location: [facility] ENDOSCOPY;  Service: Gastroenterology;  Laterality: N/A;   FLEXIBLE SIGMOIDOSCOPY N/A 10/10/2018   Procedure: FLEXIBLE SIGMOIDOSCOPY;  Surgeon: Wilhelmenia Aloha Raddle., MD;  Location: [facility] ENDOSCOPY;  Service: Gastroenterology;  Laterality: N/A;   FLEXIBLE SIGMOIDOSCOPY N/A 06/12/2019   Procedure: FLEXIBLE SIGMOIDOSCOPY;  Surgeon: Wilhelmenia Aloha Raddle., MD;  Location: [facility] ENDOSCOPY;  Service: Gastroenterology;  Laterality: N/A;   FLEXIBLE SIGMOIDOSCOPY N/A 09/30/2020   Procedure: FLEXIBLE  SIGMOIDOSCOPY;  Surgeon: Wilhelmenia Aloha Raddle., MD;  Location: [facility];  Service: Gastroenterology;  Laterality: N/A;   HEMOSTASIS CLIP PLACEMENT  10/10/2018   Procedure: HEMOSTASIS CLIP PLACEMENT;  Surgeon: Wilhelmenia Aloha Raddle., MD;  Location: [facility] ENDOSCOPY;  Service: Gastroenterology;;   LUNG CANCER SURGERY  12/30/2018   right lun- mid lobe , dr gerhardt   POLYPECTOMY  11/18/2022   Procedure: POLYPECTOMY;  Surgeon: Wilhelmenia Aloha Raddle., MD;  Location: [facility];  Service: Gastroenterology;;   HARLEY DILATION N/A 11/18/2022   Procedure: HARLEY HODGKIN;  Surgeon: Wilhelmenia Aloha Raddle., MD;  Location: [facility];  Service: Gastroenterology;  Laterality: N/A;   SUBMUCOSAL LIFTING INJECTION  10/10/2018   Procedure: SUBMUCOSAL LIFTING INJECTION;  Surgeon: Wilhelmenia Aloha Raddle., MD;  Location: [facility] ENDOSCOPY;  Service: Gastroenterology;;   TEE WITHOUT CARDIOVERSION  02/2018   TONSILLECTOMY     TOTAL HIP ARTHROPLASTY Left 11/2021   TOTAL LAPAROSCOPIC HYSTERECTOMY WITH BILATERAL SALPINGO OOPHORECTOMY Bilateral 04/29/2023   Procedure: TOTAL LAPAROSCOPIC HYSTERECTOMY WITH BILATERAL SALPINGO OOPHORECTOMY;  Surgeon: Horacio Boas, MD;  Location: [facility];  Service: Gynecology;  Laterality: Bilateral;   TUBAL LIGATION  1987   VIDEO ASSISTED THORACOSCOPY (VATS)/WEDGE RESECTION Right 12/30/2018   Procedure: VIDEO ASSISTED THORACOSCOPY (VATS), MINI THORACOTOMY, RIGHT MIDDLE LOBECTOMY WITH LYMPH NODE DISSECTION;  Surgeon: Army Dallas NOVAK, MD;  Location: [facility] OR;  Service: Thoracic;  Laterality: Right;   VIDEO BRONCHOSCOPY N/A 10/12/2018   Procedure: VIDEO BRONCHOSCOPY;  Surgeon: Army Dallas NOVAK, MD;  Location: [facility] OR;  Service: Thoracic;  Laterality: N/A;   VIDEO BRONCHOSCOPY N/A 12/30/2018   Procedure: VIDEO BRONCHOSCOPY;  Surgeon: Army Dallas NOVAK, MD;  Location: [facility] OR;  Service: Thoracic;  Laterality: N/A;   WISDOM TOOTH EXTRACTION      REVIEW OF SYSTEMS:  A  comprehensive review of systems was negative except for: Respiratory: positive for dyspnea on exertion   PHYSICAL EXAMINATION: General appearance: alert, cooperative, and no distress Head: Normocephalic, without obvious abnormality, atraumatic Neck: no adenopathy, no JVD, supple, symmetrical, trachea midline, and thyroid not enlarged, symmetric, no tenderness/mass/nodules Lymph nodes: Cervical, supraclavicular, and axillary nodes normal. Resp: clear to auscultation bilaterally Back: symmetric, no curvature. ROM normal. No CVA tenderness. Cardio: regular rate and rhythm, S1, S2 normal, no murmur, click, rub or gallop GI: soft, non-tender; bowel sounds normal; no masses,  no organomegaly Extremities: extremities normal, atraumatic, no cyanosis or edema  ECOG PERFORMANCE STATUS: 1 - Symptomatic but completely ambulatory  Blood pressure 126/70, pulse 75, temperature 97.8 F (36.6 C), temperature source Temporal, resp. rate 17, height 5' 4 (1.626 m), weight 182 lb (82.6 kg), SpO2 100%.  LABORATORY DATA: Lab Results  Component Value Date   WBC 5.8 03/02/2024   HGB 14.5 03/02/2024   HCT 44.9 03/02/2024   MCV 86.7 03/02/2024   PLT 255 03/02/2024      Chemistry      Component Value Date/Time   NA 141 03/02/2024 0839   NA 140 02/07/2024 1011   K 3.7 03/02/2024 0839   CL 105 03/02/2024 0839   CO2 32 03/02/2024 0839   BUN 18 03/02/2024 0839   BUN 20 02/07/2024 1011   CREATININE 0.85 03/02/2024 0839      Component Value Date/Time   CALCIUM  9.8 03/02/2024 0839   ALKPHOS 62 03/02/2024 0839   AST 18 03/02/2024 0839   ALT 16 03/02/2024 0839   BILITOT 0.5 03/02/2024 0839       RADIOGRAPHIC STUDIES: CT Chest W Contrast Result Date: 03/07/2024 CLINICAL DATA:  Non-small lung cancer restaging, bronchial carcinoid * Tracking Code: BO * EXAM: CT CHEST WITH CONTRAST TECHNIQUE: Multidetector CT imaging of the chest was performed during intravenous contrast administration. RADIATION DOSE  REDUCTION: This exam was performed according to the departmental dose-optimization program which includes automated exposure control, adjustment of the mA and/or kV according to patient size and/or use of iterative reconstruction technique. CONTRAST:  75mL OMNIPAQUE  IOHEXOL  300 MG/ML  SOLN COMPARISON:  09/03/2023 FINDINGS: Cardiovascular: Aortic atherosclerosis. Normal heart size. Left coronary artery calcifications. No pericardial effusion. Mediastinum/Nodes: No enlarged mediastinal, hilar, or axillary lymph nodes. Small hiatal hernia. Thyroid gland, trachea, and esophagus demonstrate no significant findings. Lungs/Pleura: Mild centrilobular emphysema. Status post right middle lobectomy and wedge resection of the inferior medial right upper lobe. Unchanged tiny clustered nodules in the lingula measuring no greater than 0.4 cm (series 8, image 82). Previously seen minimal ground-glass in the right lower lobe is resolved. No new nodules. No pleural effusion or pneumothorax. Upper Abdomen: No acute abnormality. Musculoskeletal: No chest wall abnormality. No acute osseous findings. IMPRESSION: 1. Status post right middle lobectomy and wedge resection of the inferior medial right upper lobe. 2. Unchanged tiny clustered nodules in the lingula measuring no greater than 0.4 cm, consistent with benign sequelae of infection or inflammation. No new nodules. 3. No evidence of recurrent or metastatic disease in the chest. 4. Emphysema. 5. Coronary artery disease. Aortic Atherosclerosis (ICD10-I70.0) and Emphysema (ICD10-J43.9). Electronically Signed   By: Marolyn JONETTA Jaksch M.D.   On: 03/07/2024 10:42    ASSESSMENT AND PLAN: This is a very pleasant 63 years old white female with history of stage Ia typical carcinoid of the lung diagnosed in 2020 in addition to history of rectal carcinoid tumor diagnosed in 2019.  She is status post surgical resection of the rectal carcinoid tumor as well as right middle lobectomy with lymph node  dissection on December 30, 2018.  She had repeat CT scan of the chest performed recently.  I personally and independently reviewed the scan and discussed the result with the patient today.  Her scan showed no concerning findings for disease recurrence or metastasis. Assessment and Plan Assessment & Plan History of stage 1A typical carcinoid tumor of lung, post-lobectomy Status post-lobectomy with lymph node dissection in June 2020. No evidence of recurrent or metastatic disease in the chest. Previously noted ground glass opacity has resolved. - Continue annual CT scan of the chest for surveillance.  History of rectal carcinoid tumor, post-resection Status post-surgical resection in 2019. No symptoms or evidence of recurrence. Bowel movements slightly altered, likely due to previous rectal prolapse repair.  Chronic obstructive pulmonary disease (COPD) Reported worsening  of breathing. No acute exacerbation during the visit. She was advised to call immediately if she has any concerning symptoms in the interval. The patient voices understanding of current disease status and treatment options and is in agreement with the current care plan.  All questions were answered. The patient knows to call the clinic with any problems, questions or concerns. We can certainly see the patient much sooner if necessary.  The total time spent in the appointment was 20 minutes.  Disclaimer: This note was dictated with voice recognition software. Similar sounding words can inadvertently be transcribed and may not be corrected upon review.

## 2024-04-05 ENCOUNTER — Other Ambulatory Visit: Payer: Self-pay | Admitting: Physician Assistant

## 2024-04-05 DIAGNOSIS — I10 Essential (primary) hypertension: Secondary | ICD-10-CM

## 2024-04-29 ENCOUNTER — Other Ambulatory Visit: Payer: Self-pay | Admitting: Family Medicine

## 2024-04-29 DIAGNOSIS — F419 Anxiety disorder, unspecified: Secondary | ICD-10-CM

## 2024-05-30 ENCOUNTER — Other Ambulatory Visit: Payer: Self-pay | Admitting: Physical Medicine and Rehabilitation

## 2024-06-06 ENCOUNTER — Other Ambulatory Visit: Payer: Self-pay | Admitting: Physician Assistant

## 2024-06-06 DIAGNOSIS — F419 Anxiety disorder, unspecified: Secondary | ICD-10-CM

## 2024-06-19 ENCOUNTER — Ambulatory Visit: Admitting: Family Medicine

## 2024-06-19 ENCOUNTER — Encounter: Payer: Self-pay | Admitting: Family Medicine

## 2024-06-19 VITALS — BP 136/88 | Ht 65.0 in | Wt 180.0 lb

## 2024-06-19 DIAGNOSIS — M722 Plantar fascial fibromatosis: Secondary | ICD-10-CM

## 2024-06-19 NOTE — Progress Notes (Signed)
 DATE OF VISIT: 06/19/2024        Shannon [name] DOB: 23-Oct-1960 MRN: [id number]  CC: Custom orthotics  History of present Illness: Shannon [name] is a 63 y.o. female who presents for custom orthotics Has been following with Dr. Barbra at [facility] for right plantar fasciitis. No improvement with prior shockwave therapy and injections Also having some pain in left foot No prior custom inserts Referred for custom orthotics  Medications:  Outpatient Encounter Medications as of 06/19/2024  Medication Sig   albuterol  (VENTOLIN  HFA) 108 (90 Base) MCG/ACT inhaler INHALE 2 PUFFS INTO THE LUNGS EVERY 6 HOURS AS NEEDED FOR WHEEZING OR SHORTNESS OF BREATH   busPIRone  (BUSPAR ) 10 MG tablet TAKE HALF TABLET BY MOUTH TWICE DAILY   calcium  carbonate (TUMS - DOSED IN MG ELEMENTAL CALCIUM ) 500 MG chewable tablet Chew 1 tablet by mouth daily as needed for indigestion or heartburn.   Cholecalciferol (VITAMIN D -3) 125 MCG (5000 UT) TABS Take 5,000 Units by mouth daily at 12 noon.   Cyanocobalamin (B-12) 2500 MCG TABS Take 2,500 mcg by mouth daily.   cyclobenzaprine (FLEXERIL) 10 MG tablet Take 10 mg by mouth 3 (three) times daily as needed for muscle spasms.   Evolocumab  (REPATHA  SURECLICK) 140 MG/ML SOAJ ADMINISTER 1 ML(140 MG) UNDER THE SKIN EVERY 14 DAYS   ezetimibe  (ZETIA ) 10 MG tablet Take 1 tablet (10 mg total) by mouth daily.   gabapentin  (NEURONTIN ) 300 MG capsule Take 300 mg by mouth at bedtime as needed (pain).   lisinopril  (ZESTRIL ) 20 MG tablet TAKE 1 TABLET(20 MG) BY MOUTH DAILY   LORazepam  (ATIVAN ) 1 MG tablet TAKE 1 TABLET(1 MG) BY MOUTH AT BEDTIME   omeprazole  (PRILOSEC) 40 MG capsule TAKE 1 CAPSULE BY MOUTH TWICE DAILY 30-60 MINUTES BEFORE MEALS   Facility-Administered Encounter Medications as of 06/19/2024  Medication   sodium chloride  (PF) 0.9 % injection 2 mL    Allergies: is allergic to topamax  [topiramate ], hydrocodone , pravastatin, levaquin [levofloxacin  in d5w], and other.  Physical Examination: Vitals: BP 136/88   Ht 5' 5 (1.651 m)   Wt 180 lb (81.6 kg)   LMP  (LMP Unknown)   BMI 29.95 kg/m  GENERAL:  Shannon [name] is a 63 y.o. female appearing their stated age, alert and oriented x 3, in no apparent distress.   MSK: Bilateral foot with moderate longitudinal arches, slight overpronation bilaterally.  Slight transverse arch collapse bilaterally with slight splaying between the 2nd and 3rd toes.  Posterior tibialis function is intact.  Walking without a limp Neurovascular intact distally Assessment & Plan Plantar fasciitis of right foot Chronic foot pain, right greater than left with plantar fasciitis, limited improvement with prior cortisone injections, shockwave, PT - Candidate for custom orthotics today  Plan: - Custom orthotics fabricated as noted below.  These were comfortable to her in the office today - She will continue with her other interventions - She did mention that [facility] is no longer in her insurance plan and she has to pay a higher co-pay.  She is unsure if she will return to them.  She is welcome to follow-up with me in the future in 4 to 6 weeks should she decide not to follow-up with them  Patient was fitted for a : Fit 'n Run semi-rigid orthotic  The orthotic was heated, placed on the orthotic stand. The patient was positioned in subtalar neutral position and 10 degrees of ankle dorsiflexion in a weight bearing stance on  the heated orthotic blank After completion of molding Blank: Fit 'n Run - size 7 Posting:  none Base: none  Orthotics comfortable in the office today and she had a more neutral gait.    Patient expressed understanding & agreement with above.  Encounter Diagnosis  Name Primary?   Plantar fasciitis of right foot Yes    No orders of the defined types were placed in this encounter.

## 2024-06-19 NOTE — Patient Instructions (Signed)
 Thank you for coming to see me today. It was a pleasure.   We made you new orthotics. Let us know if we need to add any extra cushioning or make changes.  If you have any questions or concerns, please do not hesitate to call the office at [phone].

## 2024-06-30 ENCOUNTER — Other Ambulatory Visit: Payer: Self-pay | Admitting: Physician Assistant

## 2024-06-30 DIAGNOSIS — E782 Mixed hyperlipidemia: Secondary | ICD-10-CM

## 2024-07-12 ENCOUNTER — Telehealth: Payer: Self-pay | Admitting: Internal Medicine

## 2024-07-12 MED ORDER — EZETIMIBE 10 MG PO TABS: 10.0000 mg | ORAL_TABLET | Freq: Every day | ORAL | 0 refills | Status: DC

## 2024-07-12 NOTE — Telephone Encounter (Signed)
 Refill sent

## 2024-07-12 NOTE — Telephone Encounter (Signed)
°*  STAT* If patient is at the pharmacy, call can be transferred to refill team.   1. Which medications need to be refilled? (please list name of each medication and dose if known) ezetimibe  (ZETIA ) 10 MG tablet (Expired)   2. Which pharmacy/location (including street and city if local pharmacy) is medication to be sent to?  [id number] - [place], [place] - [address]    3. Do they need a 30 day or 90 day supply? 90   Patient has appt 12/30

## 2024-07-19 ENCOUNTER — Telehealth: Payer: Self-pay

## 2024-07-19 ENCOUNTER — Other Ambulatory Visit: Payer: Self-pay | Admitting: Physical Medicine and Rehabilitation

## 2024-07-19 NOTE — Telephone Encounter (Signed)
Refill request: Topiramate

## 2024-07-23 NOTE — Progress Notes (Unsigned)
 " Cardiology Office Note:  .   Date:  07/25/2024  ID:  Shannon [name], DOB 10-26-1960, MRN [id number] PCP: [name], DEVONNA  [facility] Providers Cardiologist:  None {  History of Present Illness: .   Shannon [name] is a 63 y.o. 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     Aortic atherosclerosis on noncardiac CT 02/2018 normal ETT.  EF normal.  No valve disease.  Social history  Mother with history of MIs in her 15s Former smoker, quit over 18+ years ago No alcohol or drugs. Moderately active 3-4 times per week.    Patient with history of aortic atherosclerosis on noncardiac CTs.  She has been asymptomatic so we are medically managing.  Last seen 06/2023 with no cardiac complaints.  She is seen on an annual basis.  Today patient presents for annual follow-up.  She reports for the past 6 months she has been having more shortness of breath than usual with exertion.  Typically was walking 2.5 miles per day and this has gradually been decreasing.  At this time she does not know how far she can go without any symptoms.  Symptoms corresponded with COPD exacerbations with recent ED admission 12/25 and at she was seen at the urgent care just prior to this appointment and got steroid injection and inhaler.  Additionally she reports sharp, rare, nonexertional, nonradiating chest pain that only happening once every 3 months or so.  Not a significant concern for her.  Shortness of breath is her biggest complaint.  ROS: Denies: orthopnea, peripheral edema, palpitations, fatigue, lightheadedness.   Studies Reviewed: [name]    EKG Interpretation Date/Time:  Tuesday July 25 2024 15:05:18 EST Ventricular Rate:   97 PR Interval:  142 QRS Duration:  76 QT Interval:  358 QTC Calculation: 454 R Axis:   33  Text Interpretation: Sinus rhythm with occasional Premature ventricular complexes When compared with ECG of 11-Mar-2023 15:31, Premature ventricular complexes are now Present Confirmed by Darryle Currier [phone]) on 07/25/2024 3:17:13 PM    Risk Assessment/Calculations:          Physical Exam:   VS:  BP 122/84   Pulse 97   Ht 5' 5 (1.651 m)   Wt 189 lb 6.4 oz (85.9 kg)   LMP  (LMP Unknown)   SpO2 98%   BMI 31.52 kg/m    Wt Readings from Last 3 Encounters:  07/25/24 189 lb 6.4 oz (85.9 kg)  06/19/24 180 lb (81.6 kg)  03/13/24 182 lb (82.6 kg)    GEN: Well nourished, well developed in no acute distress NECK: No JVD; No carotid bruits CARDIAC: RRR, no murmurs, rubs, gallops RESPIRATORY:  Clear to auscultation without rales, wheezing or rhonchi  ABDOMEN: Soft, non-tender, non-distended EXTREMITIES:  No edema; No deformity   ASSESSMENT AND PLAN: .    Chest pain/SOB Aortic atherosclerosis on noncardiac CT Hyperlipidemia-statin intolerant She has been reporting worsening DOE for the last 6 months accompanied with rare, nonexertional, sharp, nonradiating chest pain.  Her DOE could be related to COPD and she is having exacerbations more frequently as well however, given her cardiac risk factors of prior smoking history, hypertension, hyperlipidemia with family history of MI will need further workup.  EKG with no acute ST-T wave changes today.  Proceeding with coronary CTA.  Get BMP today.  Prescribing 1 dose of Lopressor 100 mg to be taken prior to her CT scan. If she does have CAD would be mindful of beta-blocker use with underlying COPD.  Could consider bisoprolol if there is an indication.  Will add aspirin if indicated. She has been given ER precautions in the meantime. LDL is 68 01/2024.  Continue with Repatha , Zetia .  Reports headaches with Repatha  but she is agreeable to continue.  Will  adjust LDL goals after coronary CTA if needed.  Hypertension Has history of hyperkalemia/hypercalcemia.  Blood pressure well-controlled today 122/84.  Continue lisinopril  20 mg daily.  Pulmonary History of right middle lobectomy due to lung cancer and history of COPD followed by pulmonology.  She has had ED visit on the 25th and then again today at urgent care prior to my appointment for COPD exacerbation.  She needs to see pulmonology soon as possible.  Dispo: 45-month follow-up with myself to go over coronary CTA results.  Signed, Thom LITTIE Sluder, PA-C  "

## 2024-07-25 ENCOUNTER — Ambulatory Visit: Payer: Self-pay

## 2024-07-25 ENCOUNTER — Encounter: Payer: Self-pay | Admitting: Cardiology

## 2024-07-25 ENCOUNTER — Ambulatory Visit: Admitting: Cardiology

## 2024-07-25 VITALS — BP 122/84 | HR 97 | Ht 65.0 in | Wt 189.4 lb

## 2024-07-25 DIAGNOSIS — I700 Atherosclerosis of aorta: Secondary | ICD-10-CM

## 2024-07-25 DIAGNOSIS — E785 Hyperlipidemia, unspecified: Secondary | ICD-10-CM

## 2024-07-25 DIAGNOSIS — R0609 Other forms of dyspnea: Secondary | ICD-10-CM | POA: Diagnosis not present

## 2024-07-25 DIAGNOSIS — R079 Chest pain, unspecified: Secondary | ICD-10-CM

## 2024-07-25 DIAGNOSIS — I10 Essential (primary) hypertension: Secondary | ICD-10-CM | POA: Diagnosis not present

## 2024-07-25 LAB — BASIC METABOLIC PANEL WITH GFR
BUN/Creatinine Ratio: 23 (ref 12–28)
BUN: 21 mg/dL (ref 8–27)
CO2: 25 mmol/L (ref 20–29)
Calcium: 10.4 mg/dL — ABNORMAL HIGH (ref 8.7–10.3)
Chloride: 100 mmol/L (ref 96–106)
Creatinine, Ser: 0.92 mg/dL (ref 0.57–1.00)
Glucose: 156 mg/dL — ABNORMAL HIGH (ref 70–99)
Potassium: 5.5 mmol/L — ABNORMAL HIGH (ref 3.5–5.2)
Sodium: 141 mmol/L (ref 134–144)
eGFR: 70 mL/min/1.73

## 2024-07-25 MED ORDER — EZETIMIBE 10 MG PO TABS: 10.0000 mg | ORAL_TABLET | Freq: Every day | ORAL | 3 refills | Status: AC

## 2024-07-25 MED ORDER — METOPROLOL TARTRATE 100 MG PO TABS: ORAL_TABLET | ORAL | 0 refills | Status: AC

## 2024-07-25 NOTE — Telephone Encounter (Signed)
 FYI Only or Action Required?: FYI only for provider: ED advised.  Patient was last seen in primary care on 02/07/2024 by [name], PA-C.  Called Nurse Triage reporting Breathing Problem.  Symptoms began several weeks ago.  Interventions attempted: Other: ED visit on 07/20/24.  Symptoms are: gradually worsening.  Triage Disposition: Go to ED Now (or PCP Triage)  Patient/caregiver understands and will follow disposition?: Yes                                  1. RESPIRATORY STATUS: Describe your breathing? (e.[name]., wheezing, shortness of breath, unable to speak, severe coughing)      Reports SOB all the time, not just at rest, wheezing and lung tightness 2. ONSET: When did this breathing problem begin?      Went to ED for symptoms on 07/20/24 and was diagnosed with flu A 3. PATTERN Does the difficult breathing come and go, or has it been constant since it started?      Constant 8. CAUSE: What do you think is causing the breathing problem?      Diagnosed with flu A 9. OTHER SYMPTOMS: Do you have any other symptoms? (e.[name]., chest pain, cough, dizziness, fever, runny nose)     Denies chest pain, but states lungs feel very tight 10. O2 SATURATION MONITOR:  Do you use an oxygen saturation monitor (pulse oximeter) at home? If Yes, ask: What is your reading (oxygen level) today? What is your usual oxygen saturation reading? (e.[name]., 95%)       96-97%    This RN advised ED. Patient verbalized understanding and agreed to go.   Copied from CRM #[id number]. Topic: Clinical - Red Word Triage >> Jul 25, 2024 12:05 PM Shannon [name] wrote: [facility] that prompted transfer to Nurse Triage: Flu a / asterbation of lungs ( diagnosis on christmas day ).. Needs breathing treatment.[name] lungs are tight/wheezing coughing  Reason for Disposition  Patient sounds very sick or weak to the triager  Protocols used: Breathing Difficulty-A-AH

## 2024-07-25 NOTE — Patient Instructions (Signed)
 Medication Instructions:  Metoprolol Tartrate 100mg  90 - 120 minutes before procedure.   *If you need a refill on your cardiac medications before your next appointment, please call your pharmacy*  Lab Work: Today BMET If you have labs (blood work) drawn today and your tests are completely normal, you will receive your results only by: MyChart Message (if you have MyChart) OR A paper copy in the mail If you have any lab test that is abnormal or we need to change your treatment, we will call you to review the results.  Testing/Procedures: Coronary CT   Your cardiac CT will be scheduled at one of the below locations:   [facility] [address] [place], [place] [address] [phone]   If scheduled at [facility], please arrive at the [facility] (Entrance C2) of [facility] 30 minutes prior to test start time. You can use the FREE valet parking offered at entrance C (encouraged to control the heart rate for the test)  Proceed to the [facility] Radiology Department (first floor) to check-in and test prep.  All radiology patients and guests should use entrance C2 at [facility], accessed from [facility], even though the hospital's physical address listed is [address].    Please follow these instructions carefully (unless otherwise directed):  An IV will be required for this test and Nitroglycerin will be given.  Hold all erectile dysfunction medications at least 3 days (72 hrs) prior to test. (Ie viagra, cialis, sildenafil, tadalafil, etc)   On the Night Before the Test: Be sure to Drink plenty of water. Do not consume any caffeinated/decaffeinated beverages or chocolate 12 hours prior to your test. Do not take any antihistamines 12 hours prior to your test. If the patient has contrast allergy : Patient will need a prescription for Prednisone  and very clear instructions (as follows): Prednisone   50 mg - take 13 hours prior to test Take another Prednisone  50 mg 7 hours prior to test Take another Prednisone  50 mg 1 hour prior to test Take Benadryl  50 mg 1 hour prior to test Patient must complete all four doses of above prophylactic medications. Patient will need a ride after test due to Benadryl .  On the Day of the Test: Drink plenty of water until 1 hour prior to the test. Do not eat any food 1 hour prior to test. You may take your regular medications prior to the test.  Take metoprolol (Lopressor) two hours prior to test. If you take Furosemide/Hydrochlorothiazide/Spironolactone, please HOLD on the morning of the test. FEMALES- please wear underwire-free bra if available, avoid dresses & tight clothing       After the Test: Drink plenty of water. After receiving IV contrast, you may experience a mild flushed feeling. This is normal. On occasion, you may experience a mild rash up to 24 hours after the test. This is not dangerous. If this occurs, you can take Benadryl  25 mg and increase your fluid intake. If you experience trouble breathing, this can be serious. If it is severe call 911 IMMEDIATELY. If it is mild, please call our office. If you take any of these medications: Glipizide/Metformin, Avandament, Glucavance, please do not take 48 hours after completing test unless otherwise instructed.  We will call to schedule your test 2-4 weeks out understanding that some insurance companies will need an authorization prior to the service being performed.   For more information and frequently asked questions, please visit  our website : [email or web address]  For non-scheduling related questions, please contact the cardiac imaging nurse navigator should you have any questions/concerns: Cardiac Imaging Nurse Navigators Direct Office Dial: [phone]   For scheduling needs, including cancellations and rescheduling, please call Brittany, [phone].   Follow-Up: At  [facility], you and your health needs are our priority.  As part of our continuing mission to provide you with exceptional heart care, our providers are all part of one team.  This team includes your primary Cardiologist (physician) and Advanced Practice Providers or APPs (Physician Assistants and Nurse Practitioners) who all work together to provide you with the care you need, when you need it.  Your next appointment:   2 month(s)  Provider:   Thom Sluder, PA-C         Other Instructions None

## 2024-07-26 ENCOUNTER — Ambulatory Visit: Payer: Self-pay | Admitting: Cardiology

## 2024-07-26 DIAGNOSIS — I10 Essential (primary) hypertension: Secondary | ICD-10-CM

## 2024-07-30 ENCOUNTER — Other Ambulatory Visit: Payer: Self-pay | Admitting: Physician Assistant

## 2024-07-30 DIAGNOSIS — F419 Anxiety disorder, unspecified: Secondary | ICD-10-CM

## 2024-07-30 DIAGNOSIS — E782 Mixed hyperlipidemia: Secondary | ICD-10-CM

## 2024-07-31 ENCOUNTER — Other Ambulatory Visit: Payer: Self-pay | Admitting: *Deleted

## 2024-07-31 NOTE — Telephone Encounter (Signed)
 Patient presented for lab work today. Order placed and released

## 2024-08-01 ENCOUNTER — Telehealth: Payer: Self-pay | Admitting: Internal Medicine

## 2024-08-01 LAB — BASIC METABOLIC PANEL WITH GFR
BUN/Creatinine Ratio: 22 (ref 12–28)
BUN: 19 mg/dL (ref 8–27)
CO2: 27 mmol/L (ref 20–29)
Calcium: 10.1 mg/dL (ref 8.7–10.3)
Chloride: 104 mmol/L (ref 96–106)
Creatinine, Ser: 0.85 mg/dL (ref 0.57–1.00)
Glucose: 75 mg/dL (ref 70–99)
Potassium: 4.3 mmol/L (ref 3.5–5.2)
Sodium: 143 mmol/L (ref 134–144)
eGFR: 77 mL/min/1.73

## 2024-08-01 NOTE — Telephone Encounter (Signed)
 Pt c/o medication issue:  1. Name of Medication:   metoprolol  tartrate (LOPRESSOR ) 100 MG tablet      2. How are you currently taking this medication (dosage and times per day)? None   3. Are you having a reaction (difficulty breathing--STAT)? No   4. What is your medication issue? Pt did lab work in regard to her medication and would like to know if she should continue or not. Please advise.

## 2024-08-02 ENCOUNTER — Other Ambulatory Visit (HOSPITAL_COMMUNITY): Payer: Self-pay

## 2024-08-02 MED ORDER — AMLODIPINE BESYLATE 5 MG PO TABS: 5.0000 mg | ORAL_TABLET | Freq: Every day | ORAL | 0 refills | Status: AC

## 2024-08-02 MED ORDER — OMRON 3 SERIES BP MONITOR DEVI
0 refills | Status: AC
  Filled 2024-08-02: qty 1, 30d supply, fill #0
  Filled 2024-08-23: qty 1, 90d supply, fill #0

## 2024-08-02 NOTE — Addendum Note (Signed)
 Addended by: JOSHUA ANDREZ PARAS on: 08/02/2024 04:45 PM   Modules accepted: Orders

## 2024-08-02 NOTE — Addendum Note (Signed)
 Addended by: JOSHUA ANDREZ PARAS on: 08/02/2024 05:01 PM   Modules accepted: Orders

## 2024-08-02 NOTE — Telephone Encounter (Signed)
 Spoke to pt about recommendation for new medication. Sent in to preferred pharmacy. Pt states that her bp machine is broke. Will send in a prescription to see if her insurance will cover one.

## 2024-08-07 ENCOUNTER — Encounter (HOSPITAL_COMMUNITY): Payer: Self-pay

## 2024-08-08 ENCOUNTER — Ambulatory Visit (HOSPITAL_COMMUNITY)

## 2024-08-11 ENCOUNTER — Other Ambulatory Visit (HOSPITAL_COMMUNITY): Payer: Self-pay

## 2024-08-15 ENCOUNTER — Telehealth: Payer: Self-pay | Admitting: Internal Medicine

## 2024-08-15 NOTE — Telephone Encounter (Signed)
 Called pt to advise of MD response Sorry to see that shes going through all of this. While its possible these symptoms could be related. These are not classic symptoms that are seen on high frequency with amlodipine . It also sounds like it has not affected her blood pressure either way. I am on paternity leave right now and dont have full access to her records, however; it appears in chart review: there is some discrepancies on the amount of lisinopril  she takes. A reasonable course of action may be. - clarification of lisinopril  dose - as long as she is not on 40 mg dose, we could double this - she has a cardiac CT planned in eight days. A BMP could be drawn at that time for safety. - Thom was kind enough to schedule a follow up visit for after the CT to review those results - stop CCB and see if her symptoms resolve   Dr. JAYSON  Pt reports was told to stop Lisinopril  d/t elevated potassium level.  Advised pt will send message to provider to f/u.

## 2024-08-15 NOTE — Telephone Encounter (Signed)
 Pt states she started Amlodipine  08/04/24 and has since been having nausea, diarrhea, indigestion and gas when laying down and feeling tired. Pt also states she takes it in the morning and wondering if it would be better to take at night or if she could change medication altogether. Pt states she hasn't taken the medication today and is feeling better. Will send to Dr Santo and pharmacy for any further recommendations. Pt verbalizes understanding of plan.   Current BP: 167/88 Pt also states blood pressure hasn't been much different since starting Amlodipine . Has had a few with SBP 150's.

## 2024-08-15 NOTE — Telephone Encounter (Signed)
 Pt c/o medication issue:  1. Name of Medication: amLODipine  (NORVASC ) 5 MG tablet  2. How are you currently taking this medication (dosage and times per day)? As written  3. Are you having a reaction (difficulty breathing--STAT)? no  4. What is your medication issue? Pt is stating that she is having symptoms regarding this medication. Pt states that she is only taking  it in the morning and would like to know if she should take it at night as well    Please advise

## 2024-08-17 NOTE — Telephone Encounter (Signed)
 Spoke with pt to offer her a sooner appointment. Appt already set for 2/25. Pt denied DOD spot stating she did not feel this was urgent. Pt stated she didn't feel she needed a sooner appt.  Pt states she would keep log to track BP. Patient would like answers to the following:  1) Should she try another medication in the mean time? (Before appt) 2) Should she still have her testing (CT) next week? (Advised to keep)  Pt is not currently taking medications and would prefer not to go back on tried medication. She stated feels much better off of them.   Will route to Thom Sluder to review.

## 2024-08-17 NOTE — Telephone Encounter (Signed)
 Pt calling for an update on what to do about her medication.

## 2024-08-21 ENCOUNTER — Encounter (HOSPITAL_COMMUNITY): Payer: Self-pay

## 2024-08-23 ENCOUNTER — Ambulatory Visit (HOSPITAL_COMMUNITY)
Admission: RE | Admit: 2024-08-23 | Discharge: 2024-08-23 | Disposition: A | Discharge status: Home / Self Care | Source: Ambulatory Visit | Attending: Cardiology | Admitting: Cardiology

## 2024-08-23 ENCOUNTER — Telehealth: Payer: Self-pay | Admitting: Cardiology

## 2024-08-23 ENCOUNTER — Ambulatory Visit (HOSPITAL_COMMUNITY)

## 2024-08-23 ENCOUNTER — Other Ambulatory Visit (HOSPITAL_COMMUNITY): Payer: Self-pay

## 2024-08-23 DIAGNOSIS — R079 Chest pain, unspecified: Secondary | ICD-10-CM | POA: Diagnosis not present

## 2024-08-23 MED ORDER — IOHEXOL 350 MG/ML SOLN
100.0000 mL | Freq: Once | INTRAVENOUS | Status: AC | PRN
  Administered 2024-08-23: 100 mL via INTRAVENOUS

## 2024-08-23 MED ORDER — NITROGLYCERIN 0.4 MG SL SUBL
0.8000 mg | SUBLINGUAL_TABLET | Freq: Once | SUBLINGUAL | Status: AC
  Administered 2024-08-23: 0.8 mg via SUBLINGUAL

## 2024-08-23 NOTE — Telephone Encounter (Signed)
 Paper Work Dropped Off: BP readings   Date: 08-23-24  Location of paper:  S.Haley's mailbox

## 2024-08-24 ENCOUNTER — Telehealth: Payer: Self-pay

## 2024-08-24 DIAGNOSIS — Z79899 Other long term (current) drug therapy: Secondary | ICD-10-CM

## 2024-08-24 MED ORDER — CHLORTHALIDONE 12.5 MG PO TABS: 12.5000 mg | ORAL_TABLET | Freq: Every day | ORAL | 2 refills | Status: DC

## 2024-08-24 NOTE — Telephone Encounter (Signed)
 Patient notified

## 2024-08-24 NOTE — Addendum Note (Signed)
 Addended by: Jonavin Seder on: 08/24/2024 01:14 PM   Modules accepted: Orders

## 2024-08-24 NOTE — Telephone Encounter (Signed)
" ° °  Thom Sluder, PA-C stated that the patient's systolic range is between 130-160.  SEE PHONE NOTE DATED 08/15/24 for recommendations.  "

## 2024-08-24 NOTE — Telephone Encounter (Signed)
 Pt returning nurse's call. Please advise

## 2024-08-28 ENCOUNTER — Encounter: Admitting: Physician Assistant

## 2024-08-28 ENCOUNTER — Telehealth (HOSPITAL_BASED_OUTPATIENT_CLINIC_OR_DEPARTMENT_OTHER): Payer: Self-pay

## 2024-08-28 NOTE — Telephone Encounter (Unsigned)
 Copied from CRM [id number]. Topic: Clinical - Lab/Test Results >> Aug 28, 2024 11:38 AM Dedra B wrote: Reason for CRM: Patient said she recently had a CT coronary morph that showed infection or inflammation in her lungs. Her cardiologist would like Dr. Kassie to review the results and determine next steps.

## 2024-08-29 ENCOUNTER — Other Ambulatory Visit (HOSPITAL_COMMUNITY): Payer: Self-pay

## 2024-08-29 ENCOUNTER — Telehealth (HOSPITAL_BASED_OUTPATIENT_CLINIC_OR_DEPARTMENT_OTHER): Admitting: Pulmonary Disease

## 2024-08-29 ENCOUNTER — Telehealth: Payer: Self-pay | Admitting: Pharmacy Technician

## 2024-08-29 MED ORDER — ALBUTEROL SULFATE HFA 108 (90 BASE) MCG/ACT IN AERS: 2.0000 | INHALATION_SPRAY | Freq: Four times a day (QID) | RESPIRATORY_TRACT | 5 refills | Status: AC | PRN

## 2024-08-29 MED ORDER — ALBUTEROL SULFATE (2.5 MG/3ML) 0.083% IN NEBU: 2.5000 mg | INHALATION_SOLUTION | Freq: Four times a day (QID) | RESPIRATORY_TRACT | 12 refills | Status: AC | PRN

## 2024-08-29 MED ORDER — AZITHROMYCIN 250 MG PO TABS: ORAL_TABLET | ORAL | 0 refills | Status: AC

## 2024-08-29 MED ORDER — CHLORTHALIDONE 25 MG PO TABS: 12.5000 mg | ORAL_TABLET | Freq: Every day | ORAL | 3 refills | Status: AC

## 2024-08-29 MED ORDER — PREDNISONE 10 MG PO TABS: ORAL_TABLET | ORAL | 0 refills | Status: AC

## 2024-08-29 NOTE — Telephone Encounter (Signed)
 Chlorthalidone  12.5mg  only comes as brand-hemiclor . Insurance said the hemiclor  is plan/benefit exclusion. But they will pay for chlorthalidone  25mg  at 1/2 tablet a day. Can this please be changed to the 25mg ? If yes, can that please be sent in to her [facility] pharmacy? Thank you!

## 2024-08-29 NOTE — Telephone Encounter (Signed)
 Pt had appt today for 08/30/2023 NFN

## 2024-08-29 NOTE — Patient Instructions (Addendum)
 Emphysema - exacerbation --Prednisone  taper. Lower dose --Azithromycin  ordered --CONTINUE Albuterol  1-2 puffs AS NEEDED --CONTINUE Albuterol  nebulizer

## 2024-08-29 NOTE — Telephone Encounter (Signed)
"   Patient is aware . She can pick medication Chlorthalidone  25 mg today . She will cut in half daily to make dose 12.5 mg.  She will also do blood work  7 days after starting medication  "

## 2024-09-06 ENCOUNTER — Ambulatory Visit (HOSPITAL_COMMUNITY)

## 2024-09-19 ENCOUNTER — Ambulatory Visit (HOSPITAL_BASED_OUTPATIENT_CLINIC_OR_DEPARTMENT_OTHER): Admitting: Pulmonary Disease

## 2024-09-20 ENCOUNTER — Ambulatory Visit: Admitting: Cardiology

## 2024-11-06 ENCOUNTER — Encounter: Admitting: Physical Medicine and Rehabilitation

## 2025-03-07 ENCOUNTER — Other Ambulatory Visit (HOSPITAL_COMMUNITY)

## 2025-03-07 ENCOUNTER — Other Ambulatory Visit

## 2025-03-14 ENCOUNTER — Ambulatory Visit: Admitting: Internal Medicine
